# Patient Record
Sex: Female | Born: 1935 | Race: Black or African American | Hispanic: No | Smoking: Former smoker
Health system: Southern US, Community
[De-identification: ages and names within clinical notes are randomized; demographics above are authoritative.]

## PROBLEM LIST (undated history)

## (undated) DIAGNOSIS — J449 Chronic obstructive pulmonary disease, unspecified: Secondary | ICD-10-CM

## (undated) DIAGNOSIS — Z8679 Personal history of other diseases of the circulatory system: Secondary | ICD-10-CM

## (undated) DIAGNOSIS — I5032 Chronic diastolic (congestive) heart failure: Secondary | ICD-10-CM

## (undated) DIAGNOSIS — D649 Anemia, unspecified: Secondary | ICD-10-CM

## (undated) DIAGNOSIS — R059 Cough, unspecified: Secondary | ICD-10-CM

## (undated) DIAGNOSIS — Z87891 Personal history of nicotine dependence: Secondary | ICD-10-CM

## (undated) DIAGNOSIS — R05 Cough: Secondary | ICD-10-CM

## (undated) DIAGNOSIS — I10 Essential (primary) hypertension: Secondary | ICD-10-CM

## (undated) DIAGNOSIS — R0789 Other chest pain: Secondary | ICD-10-CM

## (undated) DIAGNOSIS — Z8719 Personal history of other diseases of the digestive system: Secondary | ICD-10-CM

## (undated) DIAGNOSIS — I359 Nonrheumatic aortic valve disorder, unspecified: Secondary | ICD-10-CM

## (undated) DIAGNOSIS — K2970 Gastritis, unspecified, without bleeding: Secondary | ICD-10-CM

## (undated) DIAGNOSIS — I517 Cardiomegaly: Secondary | ICD-10-CM

## (undated) DIAGNOSIS — E669 Obesity, unspecified: Secondary | ICD-10-CM

## (undated) HISTORY — DX: Personal history of other diseases of the circulatory system: Z86.79

## (undated) HISTORY — DX: Nonrheumatic aortic valve disorder, unspecified: I35.9

## (undated) HISTORY — DX: Anemia, unspecified: D64.9

## (undated) HISTORY — DX: Chronic diastolic (congestive) heart failure: I50.32

## (undated) HISTORY — DX: Cough, unspecified: R05.9

## (undated) HISTORY — DX: Essential (primary) hypertension: I10

## (undated) HISTORY — DX: Cardiomegaly: I51.7

## (undated) HISTORY — DX: Obesity, unspecified: E66.9

## (undated) HISTORY — DX: Other chest pain: R07.89

## (undated) HISTORY — PX: HERNIA REPAIR: SHX51

## (undated) HISTORY — DX: Gastritis, unspecified, without bleeding: K29.70

## (undated) HISTORY — DX: Personal history of other diseases of the digestive system: Z87.19

## (undated) HISTORY — DX: Chronic obstructive pulmonary disease, unspecified: J44.9

## (undated) HISTORY — DX: Personal history of nicotine dependence: Z87.891

## (undated) HISTORY — PX: APPENDECTOMY: SHX54

## (undated) HISTORY — DX: Cough: R05

---

## 1996-06-26 HISTORY — PX: OTHER SURGICAL HISTORY: SHX169

## 1999-08-12 ENCOUNTER — Emergency Department (HOSPITAL_COMMUNITY): Admission: EM | Admit: 1999-08-12 | Discharge: 1999-08-12 | Payer: Self-pay | Admitting: Emergency Medicine

## 1999-10-28 ENCOUNTER — Encounter: Payer: Self-pay | Admitting: Internal Medicine

## 1999-10-28 ENCOUNTER — Ambulatory Visit (HOSPITAL_COMMUNITY): Admission: RE | Admit: 1999-10-28 | Discharge: 1999-10-28 | Payer: Self-pay | Admitting: Internal Medicine

## 1999-11-09 HISTORY — PX: ESOPHAGOGASTRODUODENOSCOPY: SHX1529

## 1999-11-24 ENCOUNTER — Ambulatory Visit (HOSPITAL_COMMUNITY): Admission: RE | Admit: 1999-11-24 | Discharge: 1999-11-24 | Payer: Self-pay | Admitting: General Surgery

## 2000-02-04 ENCOUNTER — Ambulatory Visit (HOSPITAL_COMMUNITY): Admission: RE | Admit: 2000-02-04 | Discharge: 2000-02-04 | Payer: Self-pay | Admitting: General Surgery

## 2000-02-04 ENCOUNTER — Encounter (INDEPENDENT_AMBULATORY_CARE_PROVIDER_SITE_OTHER): Payer: Self-pay | Admitting: *Deleted

## 2002-03-26 ENCOUNTER — Encounter: Payer: Self-pay | Admitting: Emergency Medicine

## 2002-03-26 ENCOUNTER — Emergency Department (HOSPITAL_COMMUNITY): Admission: EM | Admit: 2002-03-26 | Discharge: 2002-03-26 | Payer: Self-pay | Admitting: Emergency Medicine

## 2002-12-25 ENCOUNTER — Ambulatory Visit (HOSPITAL_COMMUNITY): Admission: RE | Admit: 2002-12-25 | Discharge: 2002-12-25 | Payer: Self-pay | Admitting: Endocrinology

## 2002-12-25 ENCOUNTER — Encounter: Payer: Self-pay | Admitting: Endocrinology

## 2004-06-08 ENCOUNTER — Ambulatory Visit (HOSPITAL_COMMUNITY): Admission: RE | Admit: 2004-06-08 | Discharge: 2004-06-08 | Payer: Self-pay | Admitting: Endocrinology

## 2004-10-27 ENCOUNTER — Ambulatory Visit: Payer: Self-pay | Admitting: Internal Medicine

## 2004-11-09 ENCOUNTER — Ambulatory Visit: Payer: Self-pay

## 2004-11-09 HISTORY — PX: OTHER SURGICAL HISTORY: SHX169

## 2004-11-17 ENCOUNTER — Ambulatory Visit: Payer: Self-pay | Admitting: Internal Medicine

## 2005-03-24 ENCOUNTER — Ambulatory Visit: Payer: Self-pay | Admitting: Internal Medicine

## 2005-05-02 ENCOUNTER — Ambulatory Visit: Payer: Self-pay | Admitting: Internal Medicine

## 2005-05-02 ENCOUNTER — Inpatient Hospital Stay (HOSPITAL_COMMUNITY): Admission: EM | Admit: 2005-05-02 | Discharge: 2005-05-06 | Payer: Self-pay | Admitting: Emergency Medicine

## 2005-05-18 ENCOUNTER — Ambulatory Visit: Payer: Self-pay | Admitting: Endocrinology

## 2005-06-08 ENCOUNTER — Ambulatory Visit: Payer: Self-pay | Admitting: Internal Medicine

## 2005-06-14 ENCOUNTER — Ambulatory Visit: Payer: Self-pay | Admitting: Internal Medicine

## 2006-06-06 ENCOUNTER — Encounter: Admission: RE | Admit: 2006-06-06 | Discharge: 2006-06-06 | Payer: Self-pay | Admitting: Endocrinology

## 2006-10-27 ENCOUNTER — Ambulatory Visit: Payer: Self-pay | Admitting: Endocrinology

## 2006-10-27 LAB — CONVERTED CEMR LAB
BUN: 18 mg/dL (ref 6–23)
Bacteria, U Microscopic: NEGATIVE /hpf
Bilirubin Urine: NEGATIVE
CO2: 30 meq/L (ref 19–32)
Calcium: 9.8 mg/dL (ref 8.4–10.5)
Chloride: 107 meq/L (ref 96–112)
Chol/HDL Ratio, serum: 3.6
Cholesterol: 228 mg/dL (ref 0–200)
Creatinine, Ser: 1 mg/dL (ref 0.4–1.2)
Crystals: NEGATIVE
GFR calc non Af Amer: 56 mL/min
Glomerular Filtration Rate, Af Am: 68 mL/min/{1.73_m2}
Glucose, Bld: 94 mg/dL (ref 70–99)
HDL: 63.7 mg/dL (ref >39.0)
Hemoglobin, Urine: NEGATIVE
Ketones, ur: NEGATIVE mg/dL
LDL DIRECT: 134.8 mg/dL
Nitrite: NEGATIVE
Potassium: 4.6 meq/L (ref 3.5–5.1)
Sodium: 142 meq/L (ref 135–145)
Specific Gravity, Urine: 1.02 (ref 1.000–1.03)
TSH: 1.33 microintl units/mL (ref 0.35–5.50)
Total Protein, Urine: NEGATIVE mg/dL
Triglyceride fasting, serum: 103 mg/dL (ref 0–149)
Urine Glucose: NEGATIVE mg/dL
Urobilinogen, UA: 0.2 (ref 0.0–1.0)
VLDL: 21 mg/dL (ref 0–40)
pH: 5.5 (ref 5.0–8.0)

## 2006-11-03 ENCOUNTER — Ambulatory Visit: Payer: Self-pay

## 2006-11-03 ENCOUNTER — Encounter: Payer: Self-pay | Admitting: Cardiology

## 2006-11-03 HISTORY — PX: OTHER SURGICAL HISTORY: SHX169

## 2006-11-22 ENCOUNTER — Ambulatory Visit: Payer: Self-pay | Admitting: Internal Medicine

## 2006-11-23 ENCOUNTER — Ambulatory Visit: Payer: Self-pay | Admitting: Cardiovascular Disease

## 2006-12-16 ENCOUNTER — Ambulatory Visit: Payer: Self-pay | Admitting: Cardiovascular Disease

## 2007-05-29 ENCOUNTER — Ambulatory Visit: Payer: Self-pay | Admitting: Cardiovascular Disease

## 2007-07-13 ENCOUNTER — Ambulatory Visit: Payer: Self-pay | Admitting: Cardiovascular Disease

## 2007-07-13 LAB — CONVERTED CEMR LAB
BUN: 14 mg/dL (ref 6–23)
CO2: 27 meq/L (ref 19–32)
Calcium: 9.4 mg/dL (ref 8.4–10.5)
Chloride: 108 meq/L (ref 96–112)
Creatinine, Ser: 1 mg/dL (ref 0.4–1.2)
GFR calc Af Amer: 68 mL/min
GFR calc non Af Amer: 56 mL/min
Glucose, Bld: 82 mg/dL (ref 70–99)
Potassium: 4.7 meq/L (ref 3.5–5.1)
Sodium: 142 meq/L (ref 135–145)

## 2007-08-30 ENCOUNTER — Ambulatory Visit: Payer: Self-pay | Admitting: Cardiovascular Disease

## 2007-10-14 ENCOUNTER — Encounter: Payer: Self-pay | Admitting: Endocrinology

## 2007-10-14 DIAGNOSIS — J309 Allergic rhinitis, unspecified: Secondary | ICD-10-CM | POA: Insufficient documentation

## 2007-10-14 DIAGNOSIS — I10 Essential (primary) hypertension: Secondary | ICD-10-CM | POA: Insufficient documentation

## 2007-10-14 DIAGNOSIS — I959 Hypotension, unspecified: Secondary | ICD-10-CM | POA: Insufficient documentation

## 2007-10-14 DIAGNOSIS — Z87891 Personal history of nicotine dependence: Secondary | ICD-10-CM | POA: Insufficient documentation

## 2007-10-14 DIAGNOSIS — I517 Cardiomegaly: Secondary | ICD-10-CM | POA: Insufficient documentation

## 2007-10-14 DIAGNOSIS — R0789 Other chest pain: Secondary | ICD-10-CM | POA: Insufficient documentation

## 2007-12-07 ENCOUNTER — Ambulatory Visit: Payer: Self-pay | Admitting: Pulmonary Disease

## 2007-12-07 DIAGNOSIS — R05 Cough: Secondary | ICD-10-CM

## 2007-12-07 DIAGNOSIS — R059 Cough, unspecified: Secondary | ICD-10-CM | POA: Insufficient documentation

## 2008-01-05 ENCOUNTER — Encounter: Admission: RE | Admit: 2008-01-05 | Discharge: 2008-01-05 | Payer: Self-pay | Admitting: Internal Medicine

## 2008-03-25 ENCOUNTER — Ambulatory Visit: Payer: Self-pay | Admitting: Internal Medicine

## 2008-03-28 DIAGNOSIS — K2970 Gastritis, unspecified, without bleeding: Secondary | ICD-10-CM | POA: Insufficient documentation

## 2008-03-28 DIAGNOSIS — K2990 Gastroduodenitis, unspecified, without bleeding: Secondary | ICD-10-CM

## 2008-04-11 ENCOUNTER — Encounter: Payer: Self-pay | Admitting: Internal Medicine

## 2008-11-20 ENCOUNTER — Ambulatory Visit: Payer: Self-pay | Admitting: Cardiovascular Disease

## 2008-11-20 LAB — CONVERTED CEMR LAB
BUN: 21 mg/dL (ref 6–23)
Basophils Absolute: 0 10*3/uL (ref 0.0–0.1)
Basophils Relative: 0.6 % (ref 0.0–3.0)
CO2: 27 meq/L (ref 19–32)
Calcium: 9.3 mg/dL (ref 8.4–10.5)
Chloride: 110 meq/L (ref 96–112)
Cholesterol: 228 mg/dL (ref 0–200)
Creatinine, Ser: 1 mg/dL (ref 0.4–1.2)
Direct LDL: 117.6 mg/dL
Eosinophils Absolute: 0.1 10*3/uL (ref 0.0–0.7)
Eosinophils Relative: 2.8 % (ref 0.0–5.0)
GFR calc Af Amer: 68 mL/min
GFR calc non Af Amer: 56 mL/min
Glucose, Bld: 96 mg/dL (ref 70–99)
HCT: 39.2 % (ref 36.0–46.0)
HDL: 70.9 mg/dL (ref >39.0)
Hemoglobin: 13.2 g/dL (ref 12.0–15.0)
Lymphocytes Relative: 56 % — ABNORMAL HIGH (ref 12.0–46.0)
MCHC: 33.6 g/dL (ref 30.0–36.0)
MCV: 96 fL (ref 78.0–100.0)
Monocytes Absolute: 0.3 10*3/uL (ref 0.1–1.0)
Monocytes Relative: 7.5 % (ref 3.0–12.0)
Neutro Abs: 1.5 10*3/uL (ref 1.4–7.7)
Neutrophils Relative %: 33.1 % — ABNORMAL LOW (ref 43.0–77.0)
Platelets: 159 10*3/uL (ref 150–400)
Potassium: 4.1 meq/L (ref 3.5–5.1)
RBC: 4.09 M/uL (ref 3.87–5.11)
RDW: 13.1 % (ref 11.5–14.6)
Sodium: 142 meq/L (ref 135–145)
Total CHOL/HDL Ratio: 3.2
Triglycerides: 68 mg/dL (ref 0–149)
VLDL: 14 mg/dL (ref 0–40)
WBC: 4.4 10*3/uL — ABNORMAL LOW (ref 4.5–10.5)

## 2008-12-23 ENCOUNTER — Ambulatory Visit: Payer: Self-pay | Admitting: Internal Medicine

## 2008-12-23 ENCOUNTER — Inpatient Hospital Stay (HOSPITAL_COMMUNITY): Admission: EM | Admit: 2008-12-23 | Discharge: 2008-12-28 | Payer: Self-pay | Admitting: Emergency Medicine

## 2009-05-28 DIAGNOSIS — Z8719 Personal history of other diseases of the digestive system: Secondary | ICD-10-CM | POA: Insufficient documentation

## 2009-05-28 DIAGNOSIS — J439 Emphysema, unspecified: Secondary | ICD-10-CM | POA: Insufficient documentation

## 2009-05-29 ENCOUNTER — Ambulatory Visit: Payer: Self-pay | Admitting: Cardiovascular Disease

## 2009-05-29 DIAGNOSIS — R0989 Other specified symptoms and signs involving the circulatory and respiratory systems: Secondary | ICD-10-CM

## 2009-05-29 DIAGNOSIS — R0602 Shortness of breath: Secondary | ICD-10-CM | POA: Insufficient documentation

## 2009-05-29 DIAGNOSIS — R072 Precordial pain: Secondary | ICD-10-CM | POA: Insufficient documentation

## 2009-05-29 DIAGNOSIS — R0609 Other forms of dyspnea: Secondary | ICD-10-CM | POA: Insufficient documentation

## 2009-06-02 ENCOUNTER — Ambulatory Visit: Payer: Self-pay | Admitting: Cardiovascular Disease

## 2009-06-09 ENCOUNTER — Telehealth (INDEPENDENT_AMBULATORY_CARE_PROVIDER_SITE_OTHER): Payer: Self-pay | Admitting: *Deleted

## 2009-06-10 ENCOUNTER — Encounter: Payer: Self-pay | Admitting: Cardiovascular Disease

## 2009-06-10 ENCOUNTER — Ambulatory Visit: Payer: Self-pay

## 2009-06-10 ENCOUNTER — Encounter: Payer: Self-pay | Admitting: Internal Medicine

## 2009-06-26 ENCOUNTER — Ambulatory Visit: Payer: Self-pay | Admitting: Internal Medicine

## 2009-06-26 DIAGNOSIS — J310 Chronic rhinitis: Secondary | ICD-10-CM | POA: Insufficient documentation

## 2009-07-07 ENCOUNTER — Ambulatory Visit: Payer: Self-pay | Admitting: Cardiovascular Disease

## 2009-09-25 ENCOUNTER — Encounter: Payer: Self-pay | Admitting: Cardiovascular Disease

## 2009-09-25 ENCOUNTER — Encounter (INDEPENDENT_AMBULATORY_CARE_PROVIDER_SITE_OTHER): Payer: Self-pay | Admitting: *Deleted

## 2009-10-02 ENCOUNTER — Telehealth: Payer: Self-pay | Admitting: Cardiovascular Disease

## 2009-12-01 ENCOUNTER — Ambulatory Visit: Payer: Self-pay | Admitting: Cardiovascular Disease

## 2010-06-15 ENCOUNTER — Ambulatory Visit: Payer: Self-pay | Admitting: Cardiovascular Disease

## 2010-06-15 DIAGNOSIS — I359 Nonrheumatic aortic valve disorder, unspecified: Secondary | ICD-10-CM | POA: Insufficient documentation

## 2010-07-02 ENCOUNTER — Encounter: Payer: Self-pay | Admitting: Cardiovascular Disease

## 2010-07-02 ENCOUNTER — Ambulatory Visit: Payer: Self-pay

## 2010-07-02 ENCOUNTER — Ambulatory Visit (HOSPITAL_COMMUNITY): Admission: RE | Admit: 2010-07-02 | Discharge: 2010-07-02 | Payer: Self-pay | Admitting: Cardiovascular Disease

## 2010-07-02 ENCOUNTER — Ambulatory Visit: Payer: Self-pay | Admitting: Internal Medicine

## 2011-01-07 ENCOUNTER — Ambulatory Visit
Admission: RE | Admit: 2011-01-07 | Discharge: 2011-01-07 | Payer: Self-pay | Source: Home / Self Care | Attending: Cardiovascular Disease | Admitting: Cardiovascular Disease

## 2011-01-07 ENCOUNTER — Encounter: Payer: Self-pay | Admitting: Cardiovascular Disease

## 2011-01-24 LAB — CONVERTED CEMR LAB
BUN: 19 mg/dL (ref 6–23)
CO2: 27 meq/L (ref 19–32)
Calcium: 9.6 mg/dL (ref 8.4–10.5)
Chloride: 109 meq/L (ref 96–112)
Creatinine, Ser: 1 mg/dL (ref 0.4–1.2)
GFR calc non Af Amer: 67.4 mL/min (ref >60)
Glucose, Bld: 82 mg/dL (ref 70–99)
Potassium: 3.8 meq/L (ref 3.5–5.1)
Pro B Natriuretic peptide (BNP): 26 pg/mL (ref 0.0–100.0)
Sodium: 142 meq/L (ref 135–145)

## 2011-01-26 NOTE — Assessment & Plan Note (Signed)
Summary: STOMACH CRAMPS AND SOB/ MBW   Chief Complaint:  upset stomach.  History of Present Illness: 77 year old with knonwn history of HTN, cough, allergic rhinitis present for 4 days of epigastic discomfort, gas, bloating, nausea. Denies chest pain, dyspnea, orthopnea, hemoptysis, fever, n/v/d, edema, recent travel or antibitics, bloody stools, urinary sx.      Prior Medication List:  ADULT ASPIRIN LOW STRENGTH 81 MG  TBDP (ASPIRIN) take 1 by mouth qd PRILOSEC 20 MG  CPDR (OMEPRAZOLE) take 1 by mouth qd HYDROCHLOROTHIAZIDE 12.5 MG  TABS (HYDROCHLOROTHIAZIDE) take 1 by mouth once daily   Current Allergies (reviewed today): ! Delrae Sawyers  Past Medical History:    Reviewed history from 10/14/2007 and no changes required:       COUGH (ICD-786.2)       VENTRICULAR HYPERTROPHY, LEFT (ICD-429.3)       ATHEROSCLEROTIC CARDIOVASCULAR DISEASE (ICD-429.2)       TOBACCO USE, QUIT (ICD-V15.82)       OBESITY (ICD-278.00)       CHEST PAIN, NON-CARDIAC (ICD-786.59)       HYPERTENSION (ICD-401.9)       ALLERGIC RHINITIS (ICD-477.9)           Family History:    Reviewed history and no changes required:  Social History:    Reviewed history and no changes required:   Risk Factors: Tobacco use:  quit    Year quit:  pt unable to remember    Pack-years:  30yrs, socially  Colonoscopy History:    Date of Last Colonoscopy:  06/14/2005   Review of Systems      See HPI   Vital Signs:  Patient Profile:   77 Years Old Female Weight:      147 pounds O2 Sat:      98 % O2 treatment:    Room Air Temp:     97.7 degrees F oral BP sitting:   122 / 70  (left arm) Cuff size:   regular  Vitals Entered By: Boone Master CNA (March 25, 2008 3:48 PM)             Is Patient Diabetic? No Comments Medications reviewed with patient ..................................................................Marland KitchenBoone Master CNA  March 25, 2008 3:48 PM      Physical Exam  GENERAL:  A/Ox3; pleasant &  cooperative.NAD HEENT:  [place]/AT, EOM-wnl, PERRLA, EACs-clear, TMs-wnl, NOSE-clear, THROAT-clear & wnl. NECK:  Supple w/ fair ROM; no JVD; normal carotid impulses w/o bruits; no thyromegaly or nodules palpated; no lymphadenopathy. CHEST:  Clear to P & A; w/o, wheezes/ rales/ or rhonchi. HEART:  RRR, no m/r/g  heard ABDOMEN:  Soft & nt; nml bowel sounds; no organomegaly or masses detected. EXT: Warm bilat,  no calf pain, edema, clubbing, pulses intact Skin: no rash/lesion      Impression & Recommendations:  Problem # 1:  GASTRITIS (ICD-535.50) Advance bland diet (baked chicken, potatoe, rice banana) Avoid spicy lactose, chocolate, caffeine, sodas, mints Begin Prilosec 20mg  once daily  Gas-x with meals as needed  follow up Dr. Sherene Sires 4 weeks  Please contact office for sooner follow up if symptoms do not improve or worsen   Orders: Est. Patient Level III (57846) Discussed use of medication, as well as lifestyle changes.   Medications Added to Medication List This Visit: 1)  Cvs Saline Nasal Spray 0.65 % Soln (Saline) .... Use as directed   Patient Instructions: 1)  Advance bland diet (baked chicken, potatoe, rice banana) 2)  Avoid spicy lactose, chocolate, caffeine,  sodas, mints 3)  Begin Prilosec 20mg  once daily  4)  Gas-x with meals as needed  5)  follow up Dr. Sherene Sires 4 weeks  6)  Please contact office for sooner follow up if symptoms do not improve or worsen     ]

## 2011-01-26 NOTE — Assessment & Plan Note (Signed)
Summary: 1 month rov@10 :30/sl   Visit Type:  Follow-up Primary Provider:  Dr. Excell Seltzer  CC:  chest pain- Sob.  History of Present Illness: Desiree Hutchinson is a delightful 77 year old woman with chronic diastolic heart failure and mild valvular disease.  She complains of exertional dyspnea with minimal activity. This is progressive over the past several months. She denies edema, orthopnea, PND. No palps, lightheadedness, or syncope.  She was seen last month and her workup included an echo that showed normal LV function with LVEF 60% and abnormal LV relaxation (estimated high LV filling pressures). There was moderate AS and AI. A stress nuclear study was also performed and it was negative for ischemia. She was evaluated by Dr Sherene Sires who felt her symptoms are not related to pulmonary disease.  Current Medications (verified): 1)  Adult Aspirin Low Strength 81 Mg  Tbdp (Aspirin) .... Take 1 By Mouth Qd 2)  Cvs Saline Nasal Spray 0.65 %  Soln (Saline) .... Use As Directed 3)  Gas-X Extra Strength 125 Mg Caps (Simethicone) .... As Needed 4)  Avalide 150-12.5 Mg Tabs (Irbesartan-Hydrochlorothiazide) .... Take 1 Tablet By Mouth Once A Day 5)  Omnaris 50 Mcg/act  Susp (Ciclesonide) .... Two Puffs Each Nostril Daily  Allergies: 1)  * Univasc  Past History:  Past medical history reviewed for relevance to current acute and chronic problems.  Past Medical History: Reviewed history from 06/26/2009 and no changes required. ATHEROSCLEROTIC CARDIOVASCULAR DISEASE (ICD-429.2) HYPERTENSION (ICD-401.9) VENTRICULAR HYPERTROPHY, LEFT (ICD-429.3) GASTRITIS (ICD-535.50) COUGH (ICD-786.2) TOBACCO USE, QUIT (ICD-V15.82) OBESITY (ICD-278.00) CHEST PAIN, NON-CARDIAC (ICD-786.59) ALLERGIC RHINITIS (ICD-477.9) COPD (ICD-496)    - No PFT's on record SMALL BOWEL OBSTRUCTION, HX OF (ICD-V12.79)  Chronic diastolic heart failure.  Aortic Valve DZ  regurge > stenosis     - See Echo 06/10/09    Review of Systems     Negative except as per HPI   Vital Signs:  Patient profile:   77 year old female Height:      62 inches Weight:      142 pounds Pulse rate:   76 / minute Pulse rhythm:   regular Resp:     20 per minute BP sitting:   116 / 70  (left arm) Cuff size:   regular  Vitals Entered By: Vikki Ports (July 07, 2009 10:48 AM)  Physical Exam  General:  Pt is alert and oriented, elderly woman, in no acute distress. HEENT: normal Neck: normal carotid upstrokes without bruits, JVP normal Lungs: CTA CV: RRR with grade II/VI systolic murmur along LSB. There is no diastolic murmur. Abd: soft, NT, positive BS, no bruit, no organomegaly Ext: no clubbing, cyanosis, or edema. peripheral pulses 2+ and equal Skin: warm and dry without rash    EKG  Procedure date:  07/07/2009  Findings:      NSR, ST-T changes consider anterolateral ischemia.  Impression & Recommendations:  Problem # 1:  SHORTNESS OF BREATH (ICD-786.05) Suspect this is related to combination of diastolic dysfunction/noncompliant LV and aortic stenosis. Echo findings are suggestive of these findings. BNP was normal at 26. Would continue Avalide at present. BP is well-controlled. At age 30, I would not pursue cath or AVR at this point. In addition, AS does not appear critical by echo.  Her updated medication list for this problem includes:    Adult Aspirin Low Strength 81 Mg Tbdp (Aspirin) .Marland Kitchen... Take 1 by mouth qd    Avalide 150-12.5 Mg Tabs (Irbesartan-hydrochlorothiazide) .Marland Kitchen... Take 1 tablet by mouth once a  day  Problem # 2:  HYPERTENSION (ICD-401.9) BP well-controlled on present regimen. Her updated medication list for this problem includes:    Adult Aspirin Low Strength 81 Mg Tbdp (Aspirin) .Marland Kitchen... Take 1 by mouth qd    Avalide 150-12.5 Mg Tabs (Irbesartan-hydrochlorothiazide) .Marland Kitchen... Take 1 tablet by mouth once a day  Patient Instructions: 1)  Your physician recommends that you schedule a follow-up appointment in: 3  MONTHS. Prescriptions: AVALIDE 150-12.5 MG TABS (IRBESARTAN-HYDROCHLOROTHIAZIDE) Take 1 tablet by mouth once a day  #32 x 11   Entered by:   Bernita Raisin, RN, BSN   Authorized by:   Norva Karvonen, MD   Signed by:   Bernita Raisin, RN, BSN on 07/07/2009   Method used:   [facility]        [facility] [address] [phone]* (retail)       [address]       [place], [place]  [address]       Ph: [phone] or [phone]       Fax: [phone]   RxID:   [id number]

## 2011-01-26 NOTE — Assessment & Plan Note (Signed)
Summary: 3 month rov   Visit Type:  3 months follow up Primary Provider:  Dr. Excell Seltzer  CC:  Sob sometimes.  History of Present Illness: This is an 77 year old woman who presents for followup of chronic dyspnea. She continues to have dyspnea with low-level activity. She denies resting symptoms. She denies orthopnea, PND, or edema. She's undergone an extensive cardiac evaluation, including a nuclear stress test which showed no ischemia. She had an echocardiogram that showed normal LV systolic function with diastolic parameters consistent with high LV filling pressures. Her BNP was normal. She has also been followed by Dr. Sherene Sires, who has treated her for reflux disease, but has not felt that the patient has had intrinsic lung disease. She denies chest pain, palpitations, lightheadedness, or syncope.  Current Medications (verified): 1)  Adult Aspirin Low Strength 81 Mg  Tbdp (Aspirin) .... Take 1 By Mouth Qd 2)  Cvs Saline Nasal Spray 0.65 %  Soln (Saline) .... Use As Directed 3)  Gas-X Extra Strength 125 Mg Caps (Simethicone) .... As Needed 4)  Omnaris 50 Mcg/act  Susp (Ciclesonide) .... Two Puffs Each Nostril Daily 5)  Diovan Hct 80-12.5 Mg Tabs (Valsartan-Hydrochlorothiazide) .... Take 1 Tablet By Mouth Once A Day  Allergies: 1)  * Univasc  Past History:  Past medical history reviewed for relevance to current acute and chronic problems.  Past Medical History: Reviewed history from 06/26/2009 and no changes required. ATHEROSCLEROTIC CARDIOVASCULAR DISEASE (ICD-429.2) HYPERTENSION (ICD-401.9) VENTRICULAR HYPERTROPHY, LEFT (ICD-429.3) GASTRITIS (ICD-535.50) COUGH (ICD-786.2) TOBACCO USE, QUIT (ICD-V15.82) OBESITY (ICD-278.00) CHEST PAIN, NON-CARDIAC (ICD-786.59) ALLERGIC RHINITIS (ICD-477.9) COPD (ICD-496)    - No PFT's on record SMALL BOWEL OBSTRUCTION, HX OF (ICD-V12.79)  Chronic diastolic heart failure.  Aortic Valve DZ  regurge > stenosis     - See Echo 06/10/09    Review of  Systems       Negative except as per HPI   Vital Signs:  Patient profile:   77 year old female Height:      62 inches Weight:      143.25 pounds Pulse rate:   64 / minute Pulse rhythm:   regular Resp:     18 per minute BP sitting:   110 / 62  (left arm) Cuff size:   regular  Vitals Entered By: Vikki Ports (December 01, 2009 3:46 PM)  Physical Exam  General:  Pt is alert and oriented, elderly woman, in no acute distress. HEENT: normal Neck: normal carotid upstrokes without bruits, JVP normal Lungs: CTA CV: RRR without murmur or gallop Abd: soft, NT, positive BS, no bruit, no organomegaly Ext: no clubbing, cyanosis, or edema. peripheral pulses 2+ and equal Skin: warm and dry without rash    EKG  Procedure date:  12/01/2009  Findings:      Normal sinus rhythm with heart rate 64 beats per minute T-wave abnormality consider anterolateral ischemia and inferior ischemia  Impression & Recommendations:  Problem # 1:  SHORTNESS OF BREATH (ICD-786.05) The patient continues to have dyspnea. I suspect this is multifactorial, but other than her diastolic abnormalities by echo, I cannot find another cardiac etiology. Her blood pressure is well-controlled and she has no evidence of fluid overload on exam. Her BNP has been normal at less than 50. Will increase her hydrochlorothiazide from 12.5-25 mg daily to see if she may be very volume sensitive and if this will improve her dyspnea.  Her updated medication list for this problem includes:    Adult Aspirin Low Strength 81  Mg Tbdp (Aspirin) .Marland Kitchen... Take 1 by mouth qd    Diovan Hct 80-12.5 Mg Tabs (Valsartan-hydrochlorothiazide) .Marland Kitchen... Take 1 tablet by mouth once a day    Hydrochlorothiazide 12.5 Mg Tabs (Hydrochlorothiazide) .Marland Kitchen... Take one tablet by mouth daily.  Problem # 2:  HYPERTENSION (ICD-401.9) BP is under excellent control on her current medical program.  Her updated medication list for this problem includes:    Adult Aspirin  Low Strength 81 Mg Tbdp (Aspirin) .Marland Kitchen... Take 1 by mouth qd    Diovan Hct 80-12.5 Mg Tabs (Valsartan-hydrochlorothiazide) .Marland Kitchen... Take 1 tablet by mouth once a day    Hydrochlorothiazide 12.5 Mg Tabs (Hydrochlorothiazide) .Marland Kitchen... Take one tablet by mouth daily.  Orders: EKG w/ Interpretation (93000)  BP today: 110/62 Prior BP: 116/70 (07/07/2009)  Labs Reviewed: K+: 3.8 (05/29/2009) Creat: : 1.0 (05/29/2009)   Chol: 228 (11/20/2008)   HDL: 70.9 (11/20/2008)   LDL: DEL (11/20/2008)   TG: 68 (11/20/2008)  Patient Instructions: 1)  Your physician wants you to follow-up in:   6 MONTHS. You will receive a reminder letter in the mail two months in advance. If you don't receive a letter, please call our office to schedule the follow-up appointment. 2)  Your physician has recommended you make the following change in your medication: START HCTZ 12.5mg  once a day Prescriptions: HYDROCHLOROTHIAZIDE 12.5 MG TABS (HYDROCHLOROTHIAZIDE) Take one tablet by mouth daily.  #30 x 11   Entered by:   Julieta Gutting, RN, BSN   Authorized by:   Norva Karvonen, MD   Signed by:   Julieta Gutting, RN, BSN on 12/01/2009   Method used:   [facility] to        [facility] [address] [phone]* (retail)       [address]       [place], [place]  [address]       Ph: [phone] or [phone]       Fax: [phone]   RxID:   [id number]

## 2011-01-26 NOTE — Assessment & Plan Note (Signed)
Summary: f41m   Medications Added GAS-X EXTRA STRENGTH 125 MG CAPS (SIMETHICONE) as needed      Allergies Added: NKDA  Visit Type:  Follow-up  CC:  Sob- and chest pains.  History of Present Illness: Desiree Hutchinson is a delightful 77 year old woman with chronic diastolic heart failure and mild valvular disease.  She complains of exertional dyspnea with minimal activity. This is progressive over the past few months. She also c/o sharp, left sided chest pains, nonradiating.  Chest pain occurs at rest and with exertion. She denies edema, orthopnea, PND. No palps, lightheadedness, or syncope.  Current Medications (verified): 1)  Adult Aspirin Low Strength 81 Mg  Tbdp (Aspirin) .... Take 1 By Mouth Qd 2)  Cvs Saline Nasal Spray 0.65 %  Soln (Saline) .... Use As Directed 3)  Hydrochlorothiazide 12.5 Mg  Tabs (Hydrochlorothiazide) .... Take 1 By Mouth Once Daily 4)  Gas-X Extra Strength 125 Mg Caps (Simethicone) .... As Needed  Allergies (verified): No Known Drug Allergies  Past History:  Past medical, surgical, family and social histories (including risk factors) reviewed, and no changes noted (except as noted below).  Past Medical History: Reviewed history from 05/28/2009 and no changes required. Current Problems:  ATHEROSCLEROTIC CARDIOVASCULAR DISEASE (ICD-429.2) HYPERTENSION (ICD-401.9) VENTRICULAR HYPERTROPHY, LEFT (ICD-429.3) GASTRITIS (ICD-535.50) COUGH (ICD-786.2) TOBACCO USE, QUIT (ICD-V15.82) OBESITY (ICD-278.00) CHEST PAIN, NON-CARDIAC (ICD-786.59) ALLERGIC RHINITIS (ICD-477.9) COPD (ICD-496) SMALL BOWEL OBSTRUCTION, HX OF (ICD-V12.79)  Chronic diastolic heart failure.     Past Surgical History: Reviewed history from 05/28/2009 and no changes required. Small bowel obstruction (06/1996) EGD (11/09/1999) Adenoasine Cardiolite (11/09/2004) Transthoracic Echocardiogram(911/07/2006) Appendectomy Hernias repair  Social History: Reviewed history from 05/28/2009 and no  changes required. The patient is a retired Tree surgeon.  She is a remote  smoker.  She does not drink alcohol.  Review of Systems       Negative except as per HPI   Vital Signs:  Patient profile:   77 year old female Height:      63 inches Weight:      143 pounds Pulse rate:   82 / minute Pulse rhythm:   regular Resp:     20 per minute BP sitting:   130 / 74  (left arm) Cuff size:   large  Vitals Entered By: Vikki Ports (May 29, 2009 2:14 PM)  Physical Exam  General:  Pt is alert and oriented, elderly woman, in no acute distress. HEENT: normal Neck: normal carotid upstrokes without bruits, JVP normal Lungs: CTA CV: RRR without murmur or gallop Abd: soft, NT, positive BS, no bruit, no organomegaly Ext: no clubbing, cyanosis, or edema. peripheral pulses 2+ and equal Skin: warm and dry without rash    EKG  Procedure date:  05/29/2009  Findings:      NSR, LVH with repolarization abnormality  Impression & Recommendations:  Problem # 1:  SHORTNESS OF BREATH (ICD-786.05) Question etiology of symptoms. The patient has LVH on EKG and hx diastolic heart failure, but her exam suggests euvolemia. BNP was checked in the office and is within the normal range. CXR also checked and shows stable COPD changes but no acute process. Will repeat echo to assess LV function.  Her updated medication list for this problem includes:    Adult Aspirin Low Strength 81 Mg Tbdp (Aspirin) .Marland Kitchen... Take 1 by mouth qd    Hydrochlorothiazide 12.5 Mg Tabs (Hydrochlorothiazide) .Marland Kitchen... Take 1 by mouth once daily  Problem # 2:  PRECORDIAL PAIN (ICD-786.51) In setting of chest pain  with typical and atypical features, will check adenosine myoview stress test to rule-out ischemia. Her updated medication list for this problem includes:    Adult Aspirin Low Strength 81 Mg Tbdp (Aspirin) .Marland Kitchen... Take 1 by mouth qd  Orders: EKG w/ Interpretation (93000) Echocardiogram (Echo) Nuclear Stress Test (Nuc Stress  Test) TLB-BMP (Basic Metabolic Panel-BMET) (80048-METABOL) TLB-BNP (B-Natriuretic Peptide) (83880-BNPR) T-Chest x-ray, 2 views (04540)  Patient Instructions: 1)  Your physician recommends that you schedule a follow-up appointment in: 1 month 2)  Your physician recommends that you have lab work today:bmet/bnp  3)  Your physician has requested that you have an echocardiogram.  Echocardiography is a painless test that uses sound waves to create images of your heart. It provides your doctor with information about the size and shape of your heart and how well your heart's chambers and valves are working.  This procedure takes approximately one hour. There are no restrictions for this procedure. 4)  Your physician has requested that you have an adenosine myoview.  For further information please visit [email or web address]  Please follow instruction sheet, as given. 5)  Order given to patient for chest x-ray at [facility] today. (Per pt's daughter she will have this next week) Prescriptions: HYDROCHLOROTHIAZIDE 12.5 MG  TABS (HYDROCHLOROTHIAZIDE) take 1 by mouth once daily  #30 x 6   Entered by:   Sherri Rad, RN, BSN   Authorized by:   Norva Karvonen, MD   Signed by:   Sherri Rad, RN, BSN on 05/29/2009   Method used:   [facility] to        [facility] [address] [phone]* (retail)       [address]       [place], [place]  [address]       Ph: [phone] or [phone]       Fax: [phone]   RxID:   [id number]

## 2011-01-26 NOTE — Progress Notes (Signed)
Summary: NUCLEAR PRE-PROCEDURE  Phone Note Outgoing Call   Summary of Call: Reviewed information on Myoview Information Sheet (see scanned document for further details).  Spoke with patients daughter.       Nuclear Med Background Indications for Stress Test: Evaluation for Ischemia   History: Echo, Myocardial Perfusion Study  History Comments: 11/05 MPS (-) EF 66% 11/07 ECHO EFM55-60% H/O CHRONIC DIASTOLIC HEART FAILURE  Symptoms: Chest Pain, DOE, SOB    Nuclear Pre-Procedure Cardiac Risk Factors: History of Smoking, Hypertension, Lipids Height (in): 63

## 2011-01-26 NOTE — Miscellaneous (Signed)
Summary: update med  Clinical Lists Changes  Medications: Removed medication of AVALIDE 150-12.5 MG TABS (IRBESARTAN-HYDROCHLOROTHIAZIDE) Take 1 tablet by mouth once a day Added new medication of DIOVAN HCT 80-12.5 MG TABS (VALSARTAN-HYDROCHLOROTHIAZIDE) Take 1 tablet by mouth once a day

## 2011-01-26 NOTE — Assessment & Plan Note (Signed)
Summary: cough,cong./apc   Vital Signs:  Patient Profile:   77 Years Old Female Weight:      152.25 pounds O2 Sat:      98 % Temp:     98.7 degrees F oral Pulse rate:   85 / minute BP sitting:   130 / 78  (left arm)  Pt. in pain?   no  Vitals Entered By: Cyndia Diver LPN (December 07, 2007 10:27 AM) Oxygen therapy Room Air              Comments pt c/o cough with clear thick sputum in the morning, nasal drainage, tightness in chest, and sob.      Chief Complaint:  sick follow up.  History of Present Illness: patient comes in today with a complaint of a cough for the last two weeks.  The cough is primarily dry in nature with no mucus production except first thing in the morning whenever she arises.  There is been no purulence.  Patient does note postnasal drip, and even rhinorrhea at times.  She denies any reflux symptoms and is currently on Prilosec daily.  The daughter has noted a lot of upper airway noise and is concerned about her.  The patient states that her chronic dyspnea on exertion is no different than one to two months ago.  Patient denies fevers, chills, or sweats  Current Allergies: ! * UNIVASC     Review of Systems      See HPI   Physical Exam  General:     thin black female, in no acute distress Nose:     mild erythema with deviated septum to the left Mouth:     oropharynx is clear Lungs:     totally clear to auscultation Heart:     regular rate and rhythm, with a blowing systolic murmur Extremities:     no edema    Impression & Recommendations:  Problem # 1:  ALLERGIC RHINITIS (ICD-477.9) cough, that I suspect is due to postnasal drip.  I also cannot exclude the possibility of reflux disease.  There is nothing to suggest by history or exam a lower airway issue.will treat the patient with a nasal hygiene regimen as well as a nonsedating antihistamine.  I have recommended to the patient that she take Zyrtec over-the-counter 10 mg nightly.  I've  also asked her to use nasal saline spray for moisturizing her mucosa.  The patient will follow-up with Dr. Sherene Sires if she fails to see improvement Orders: Est. Patient Level III ([address])    Patient Instructions: 1)  zyrtec otc one at bedtime 2)  nasal saline spray as often as possible. 3)  f/u with Dr. Sherene Sires if doesn't improve    ]

## 2011-01-26 NOTE — Assessment & Plan Note (Signed)
Summary: f37m   Visit Type:  Follow-up Primary Provider:  Dr. Excell Seltzer  CC:  sob.  History of Present Illness: This is an 77 year old woman who presents for followup of chronic dyspnea. She continues to have dyspnea with low-level activity. She denies resting symptoms. She denies orthopnea, PND, or edema. She's undergone an extensive cardiac evaluation, including a nuclear stress test which showed no ischemia. She had an echocardiogram that showed normal LV systolic function with diastolic parameters consistent with high LV filling pressures. She has moderately severe aortic stenosis by echo. Her BNP was normal.   She continues to have dyspnea with walking on level ground at short distance. She denies chest pain or pressure.   Current Medications (verified): 1)  Adult Aspirin Low Strength 81 Mg  Tbdp (Aspirin) .... Take 1 By Mouth Qd 2)  Gas-X Extra Strength 125 Mg Caps (Simethicone) .... As Needed 3)  Omnaris 50 Mcg/act  Susp (Ciclesonide) .... Two Puffs Each Nostril Daily 4)  Diovan Hct 80-12.5 Mg Tabs (Valsartan-Hydrochlorothiazide) .... Take 1 Tablet By Mouth Once A Day 5)  Hydrochlorothiazide 12.5 Mg Tabs (Hydrochlorothiazide) .... Take One Tablet By Mouth Daily.  Allergies (verified): 1)  * Univasc  Past History:  Past medical history reviewed for relevance to current acute and chronic problems.  Past Medical History: Reviewed history from 06/26/2009 and no changes required. ATHEROSCLEROTIC CARDIOVASCULAR DISEASE (ICD-429.2) HYPERTENSION (ICD-401.9) VENTRICULAR HYPERTROPHY, LEFT (ICD-429.3) GASTRITIS (ICD-535.50) COUGH (ICD-786.2) TOBACCO USE, QUIT (ICD-V15.82) OBESITY (ICD-278.00) CHEST PAIN, NON-CARDIAC (ICD-786.59) ALLERGIC RHINITIS (ICD-477.9) COPD (ICD-496)    - No PFT's on record SMALL BOWEL OBSTRUCTION, HX OF (ICD-V12.79)  Chronic diastolic heart failure.  Aortic Valve DZ  regurge > stenosis     - See Echo 06/10/09    Review of Systems       Negative except as  per HPI   Vital Signs:  Patient profile:   78 year old female Height:      62 inches Weight:      140 pounds BMI:     25.70 O2 Sat:      98 % on Room air Pulse rate:   86 / minute Resp:     16 per minute BP sitting:   102 / 58  (left arm) Cuff size:   regular  Vitals Entered By: Burnett Kanaris, CNA (June 15, 2010 2:26 PM)  O2 Flow:  Room air  Physical Exam  General:  Pt is alert and oriented, elderly, African-American woman, in no acute distress. HEENT: normal Neck: normal carotid upstrokes without bruits, JVP normal Lungs: CTA CV: RRR with 3/6 systolic murmur best heard at LSB Abd: soft, NT, positive BS, no bruit, no organomegaly Ext: no clubbing, cyanosis, or edema. peripheral pulses 2+ and equal Skin: warm and dry without rash    EKG  Procedure date:  06/15/2010  Findings:      NSR, nonspecific T wave abnormality, HR 87 bpm.  Impression & Recommendations:  Problem # 1:  AORTIC STENOSIS (ICD-424.1) Last echo reviewed and reported both significant AS and AI. I don't appreciate AI on exam. Will repeat echo to rule out progression of AS to severe range. I suspect her dyspnea is multifactorial and not solely attributable to AS. If she does have progressive AS, will have to consider treatment options carefully at her advanced age. BP is well-controlled and I don't see any other modifiable problems that we can correct with medication. I walked the patient in the office today and oxygen saturation remained greater than  95% with ambulation.  Her updated medication list for this problem includes:    Diovan Hct 80-12.5 Mg Tabs (Valsartan-hydrochlorothiazide) .Marland Kitchen... Take 1 tablet by mouth once a day    Hydrochlorothiazide 12.5 Mg Tabs (Hydrochlorothiazide) .Marland Kitchen... Take one tablet by mouth daily.  Orders: Echocardiogram (Echo)  Problem # 2:  HYPERTENSION (ICD-401.9) BP well-controlled.  Her updated medication list for this problem includes:    Adult Aspirin Low Strength 81 Mg  Tbdp (Aspirin) .Marland Kitchen... Take 1 by mouth qd    Diovan Hct 80-12.5 Mg Tabs (Valsartan-hydrochlorothiazide) .Marland Kitchen... Take 1 tablet by mouth once a day    Hydrochlorothiazide 12.5 Mg Tabs (Hydrochlorothiazide) .Marland Kitchen... Take one tablet by mouth daily.  BP today: 102/58 Prior BP: 110/62 (12/01/2009)  Labs Reviewed: K+: 3.8 (05/29/2009) Creat: : 1.0 (05/29/2009)   Chol: 228 (11/20/2008)   HDL: 70.9 (11/20/2008)   LDL: DEL (11/20/2008)   TG: 68 (11/20/2008)  Other Orders: EKG w/ Interpretation (93000)  Patient Instructions: 1)  Your physician recommends that you continue on your current medications as directed. Please refer to the Current Medication list given to you today. 2)  Your physician wants you to follow-up in:  6 MONTHS.  You will receive a reminder letter in the mail two months in advance. If you don't receive a letter, please call our office to schedule the follow-up appointment. 3)  Your physician has requested that you have an echocardiogram.  Echocardiography is a painless test that uses sound waves to create images of your heart. It provides your doctor with information about the size and shape of your heart and how well your heart's chambers and valves are working.  This procedure takes approximately one hour. There are no restrictions for this procedure. Prescriptions: HYDROCHLOROTHIAZIDE 12.5 MG TABS (HYDROCHLOROTHIAZIDE) Take one tablet by mouth daily.  #90 x 3   Entered by:   Julieta Gutting, RN, BSN   Authorized by:   Norva Karvonen, MD   Signed by:   Julieta Gutting, RN, BSN on 06/15/2010   Method used:   [facility] to        [facility] [address] [phone]* (retail)       [address]       [place], [place]  [address]       Ph: [phone] or [phone]       Fax: [phone]   RxID:   [id number] DIOVAN HCT 80-12.5 MG TABS (VALSARTAN-HYDROCHLOROTHIAZIDE) Take 1 tablet by mouth once a day  #90 x 3   Entered by:   Julieta Gutting, RN, BSN   Authorized  by:   Norva Karvonen, MD   Signed by:   Julieta Gutting, RN, BSN on 06/15/2010   Method used:   [facility]        [facility] [address] [phone]* (retail)       [address]       [place], [place]  [address]       Ph: [phone] or [phone]       Fax: [phone]   RxID:   [id number]

## 2011-01-26 NOTE — Assessment & Plan Note (Signed)
Summary: Cardiology Nuclear Study  Nuclear Med Background Indications for Stress Test: Evaluation for Ischemia   History: Echo, Myocardial Perfusion Study  History Comments: 11/05 MPS (-) EF 66% 11/07 ECHO EFM55-60% H/O CHRONIC DIASTOLIC HEART FAILURE  Symptoms: Chest Pain, Chest Pain with Exertion, DOE, Fatigue, Palpitations, Rapid HR, SOB    Nuclear Pre-Procedure Cardiac Risk Factors: History of Smoking, Hypertension, Lipids, Overweight Caffeine/Decaff Intake: none NPO After: 6:00 PM Lungs: clear IV 0.9% NS with Angio Cath: 22g     IV Site: (R) AC IV Started by: Irean Hong RN Chest Size (in) 36     Cup Size B     Height (in): 62 Weight (lb): 139 BMI: 25.52  Nuclear Med Study 1 or 2 day study:  1 day     Stress Test Type:  Adenosine Reading MD:  Arvilla Meres, MD     Referring MD:  Judie Petit. Copper Resting Radionuclide:  Technetium 87m Tetrofosmin     Resting Radionuclide Dose:  10.8 mCi  Stress Radionuclide:  Technetium 59m Tetrofosmin     Stress Radionuclide Dose:  33 mCi   Stress Protocol  Dose of Adenosine:  35.4 mg    Stress Test Technologist:  Irean Hong RN     Nuclear Technologist:  Domenic Polite CNMT  Rest Procedure  Myocardial perfusion imaging was performed at rest 45 minutes following the intraveneous administration of Myoview Technetium 51m Tetrofosmin.  Stress Procedure  The patient received IV adenosine at 140 mcg/kg/min for 3 minutes. The EKG was nondiagnostic due to baseline changes, 2nd AVB 1and 2, and 3rd AVB with infusion decreased to 133mcg/kg/min for last minute.  Myoview was injected at the 2 minute mark and quantitative spect images were obtained after a 45 minute delay.  QPS Raw Data Images:  Normal; no motion artifact; normal heart/lung ratio. Stress Images:  There is normal uptake in all areas. Rest Images:  Normal homogeneous uptake in all areas of the myocardium. Subtraction (SDS):  Normal Transient Ischemic Dilatation:  .91  (Normal  <1.22)  Lung/Heart Ratio:  .22  (Normal <0.45)  Quantitative Gated Spect Images QGS EDV:  65 ml QGS ESV:  25 ml QGS EF:  61 % QGS cine images:  Normal  Findings Normal nuclear study      Overall Impression  Exercise Capacity: Adenosine study with no exercise. ECG Impression: Baseline: NSR; with diffuse T wave abnormalities. Transient normalization of T wave abnormalities with adenosine. Overall Impression: Normal stress nuclear study.  Appended Document: Cardiology Nuclear Study reviewed. please notify pt of normal result.  Appended Document: Cardiology Nuclear Study Pt's daughter aware of results by phone.  Pt has an appt on July 12th with Dr Excell Seltzer.

## 2011-01-26 NOTE — Medication Information (Signed)
Summary: Diovan  Diovan   Imported By: Marylou Mccoy 10/07/2009 09:49:48  _____________________________________________________________________  External Attachment:    Type:   Image     Comment:   External Document

## 2011-01-26 NOTE — Progress Notes (Signed)
Summary: meds too high  Phone Note Call from Patient Call back at Home Phone [phone] Call back at [phone]   Caller: Daughter Reason for Call: Talk to Nurse Details for Reason: cost of meds is too high can pt get a something cheaper Initial call taken by: Lorne Skeens,  October 02, 2009 9:09 AM  Follow-up for Phone Call        Spoke with the pt's daughter and made her aware that we have already prescribed a cheaper alternative.  [facility] Diovan HCT [facility].  The pt should stop Avalide HCT when she starts Diovan HCT.  The daughter agrees with plan. Follow-up by: Julieta Gutting, RN, BSN,  October 02, 2009 9:35 AM

## 2011-01-26 NOTE — Assessment & Plan Note (Signed)
Summary: Pulmonary/ eval doe ? all Aortic valve dz   Primary Desiree Hutchinson/Referring Desiree Hutchinson:  Dr. Excell Seltzer  CC:  Acute visit.  Pt c/o increased sob x 3 months.  She states that she gets out of breath walking 10 steps.  Denies any other complaints today.Marland Kitchen  History of Present Illness: 6 yobf quit smoking 2003 per old chart (she says 1980's) knonwn history of HTN,  allergic rhinitis   June 26, 2009 indolent onset progressive doe x 3 months progressive to point to where sob x 50 ft, ok at night and supine, no sign cough or variablity or chest tightness/ reflux symptoms. Pt denies any significant sore throat,  excess nasal secretions, fever, chills, sweats, unintended wt loss, pleuritic or exertional cp, orthopnea pnd or leg swelling.  Pt also denies any obvious fluctuation in symptoms with weather or environmental change or other alleviating or aggravating factors.    No flare of allergy symptoms during the progressive sob though does have sensation of chronic nasal obstruction x years.     Current Medications (verified): 1)  Adult Aspirin Low Strength 81 Mg  Tbdp (Aspirin) .... Take 1 By Mouth Qd 2)  [facility]vs Saline Nasal Spray 0.65 %  Soln (Saline) .... Use As Directed 3)  Hydrochlorothiazide 12.5 Mg  Tabs (Hydrochlorothiazide) .... Take 1 By Mouth Once Daily 4)  Gas-X Extra Strength 125 Mg Caps (Simethicone) .... As Needed  Allergies (verified): 1)  * Univasc  Past History:  Past Medical History: ATHEROSCLEROTIC CARDIOVASCULAR DISEASE (ICD-429.2) HYPERTENSION (ICD-401.9) VENTRICULAR HYPERTROPHY, LEFT (ICD-429.3) GASTRITIS (ICD-535.50) COUGH (ICD-786.2) TOBACCO USE, QUIT (ICD-V15.82) OBESITY (ICD-278.00) CHEST PAIN, NON-CARDIAC (ICD-786.59) ALLERGIC RHINITIS (ICD-477.9) COPD (ICD-496)    - No PFT's on record SMALL BOWEL OBSTRUCTION, HX OF (ICD-V12.79)  Chronic diastolic heart failure.  Aortic Valve DZ  regurge > stenosis     - See Echo 06/10/09    Family History:  Negative for  coronary artery disease. no resp dz or atopic dz  Social History: The patient is a retired Tree surgeon.  She is a remote  smoker quit 2003  She does not drink alcohol.  Review of Systems  The patient denies shortness of breath at rest, productive cough, non-productive cough, coughing up blood, chest pain, irregular heartbeats, acid heartburn, indigestion, loss of appetite, weight change, abdominal pain, difficulty swallowing, sore throat, tooth/dental problems, headaches, nasal congestion/difficulty breathing through nose, sneezing, itching, ear ache, anxiety, depression, hand/feet swelling, joint stiffness or pain, rash, change in color of mucus, and fever.    Vital Signs:  Patient profile:   77 year old female Weight:      144 pounds O2 Sat:      99 % on Room air Temp:     98.1 degrees F oral Pulse rate:   72 / minute BP sitting:   136 / 54  (right arm)  Vitals Entered By: Vernie Murders (June 26, 2009 2:27 PM)  O2 Flow:  Room air  Physical Exam  Additional Exam:  wt 139 > 144  06/26/09 HEENT: nl dentition, turbinates, and orophanx. Nl external ear canals without cough reflex NECK :  without JVD/Nodes/TM/ nl carotid upstrokes bilaterally LUNGS: no acc muscle use, clear to A and P bilaterally without cough on insp or exp maneuvers CV:  RRR  II/VI sem with diastolic component, no  increase in P2, no edema   ABD:  soft and nontender with nl excursion in the supine position. No bruits or organomegaly, bowel sounds nl MS:  warm without deformities,  calf tenderness, cyanosis or clubbing SKIN: warm and dry without lesions   NEURO:  alert, approp, no deficits     Impression & Recommendations:  Problem # 1:  SHORTNESS OF BREATH (ICD-786.05)  Consistent with progressive valvular ht dz and diastolic dysfunction in pt with relatively high bp and no symptoms attributable to airways dz (absence of cough, noct symptoms or variability) s/p remote smoking cessation.  She's had nothing that  sounds like angina or presyncope so try adding afterload reduction pending re-eval by cardiology  Orders: New Patient Level V (76160)  Problem # 2:  CHRONIC RHINITIS (ICD-472.0)  trial of omnaris rec  Each maintenance medication was reviewed in detail including most importantly the difference between maintenance and as needed and under what circumstances the prns are to be used. See instructions for specific recommendations   Orders: New Patient Level V (73710)  Medications Added to Medication List This Visit: 1)  Avalide 150-12.5 Mg Tabs (Irbesartan-hydrochlorothiazide) 2)  Omnaris 50 Mcg/act Susp (Ciclesonide) .... Two puffs each nostril daily  Patient Instructions: 1)  stop hctz 2)  start avalide 150/ 12.5 one daily  3)  try omnimist nasal twice daily as needed 4)  Keep appt to see Excell Seltzer re: aortic valve issues Prescriptions: OMNARIS 50 MCG/ACT  SUSP (CICLESONIDE) Two puffs each nostril daily  #1 x 11   Entered and Authorized by:   Nyoka Cowden MD   Signed by:   Nyoka Cowden MD on 06/28/2009   Method used:   [facility]        [facility] [address] [phone]* (retail)       [address]       [place], [place]  [address]       Ph: [phone] or [phone]       Fax: [phone]   RxID:   [id number]   Appended Document: Pulmonary/ eval doe ? all Aortic valve dz cxr, echo and nuclear med study reviewed from 05/2009 and no change in imp/recs

## 2011-01-28 NOTE — Assessment & Plan Note (Signed)
Summary: 6 month rov   Visit Type:  6 months follow up Primary Provider:  Dr. Excell Seltzer  CC:  Sob.  History of Present Illness: This is an 77 year old woman who presents for followup of chronic dyspnea. Her dyspnea is unchanged since last visit here.  She denies orthopnea, PND, or edema. She's undergone an extensive cardiac evaluation, including a nuclear stress test which showed no ischemia. She had an echocardiogram that showed normal LV systolic function with diastolic parameters consistent with high LV filling pressures. She has moderately severe aortic stenosis by echo. Her BNP was normal.   She continues to have dyspnea with walking on level ground at short distance. She denies chest pain or pressure.   Current Medications (verified): 1)  Adult Aspirin Low Strength 81 Mg  Tbdp (Aspirin) .... Take 1 By Mouth Qd 2)  Gas-X Extra Strength 125 Mg Caps (Simethicone) .... As Needed 3)  Diovan Hct 80-12.5 Mg Tabs (Valsartan-Hydrochlorothiazide) .... Take 1 Tablet By Mouth Once A Day 4)  Hydrochlorothiazide 12.5 Mg Tabs (Hydrochlorothiazide) .... Take One Tablet By Mouth Daily. 5)  Nasal Spray 0.05 % Soln (Oxymetazoline Hcl) .... As Needed  Allergies: 1)  * Univasc  Past History:  Past medical history reviewed for relevance to current acute and chronic problems.  Past Medical History: Reviewed history from 06/26/2009 and no changes required. ATHEROSCLEROTIC CARDIOVASCULAR DISEASE (ICD-429.2) HYPERTENSION (ICD-401.9) VENTRICULAR HYPERTROPHY, LEFT (ICD-429.3) GASTRITIS (ICD-535.50) COUGH (ICD-786.2) TOBACCO USE, QUIT (ICD-V15.82) OBESITY (ICD-278.00) CHEST PAIN, NON-CARDIAC (ICD-786.59) ALLERGIC RHINITIS (ICD-477.9) COPD (ICD-496)    - No PFT's on record SMALL BOWEL OBSTRUCTION, HX OF (ICD-V12.79)  Chronic diastolic heart failure.  Aortic Valve DZ  regurge > stenosis     - See Echo 06/10/09    Review of Systems       Negative except as per HPI   Vital Signs:  Patient  profile:   77 year old female Height:      62 inches Weight:      140.50 pounds BMI:     25.79 Pulse rate:   81 / minute Pulse rhythm:   regular Resp:     18 per minute BP sitting:   110 / 60  (left arm) Cuff size:   regular  Vitals Entered By: Vikki Ports (January 07, 2011 3:01 PM)  Physical Exam  General:  Pt is alert and oriented, elderly, African-American woman, in no acute distress. HEENT: normal Neck: normal carotid upstrokes without bruits, JVP normal Lungs: CTA CV: RRR with 3/6 systolic murmur best heard at LSB Abd: soft, NT, positive BS, no bruit, no organomegaly Ext: no clubbing, cyanosis, or edema. peripheral pulses 2+ and equal Skin: warm and dry without rash    EKG  Procedure date:  01/07/2011  Findings:      NSr 81 bpm, LVH with repolarization abnormality.  Impression & Recommendations:  Problem # 1:  OTHER DYSPNEA AND RESPIRATORY ABNORMALITIES (ICD-786.09) Pt appears stable. Suspcet dyspnea is multifactorial...diastolic dysfunction, aortic stenosis, advanced age. Followup echocardiogram in July to reassess degree of aortic stenosis.  Her updated medication list for this problem includes:    Adult Aspirin Low Strength 81 Mg Tbdp (Aspirin) .Marland Kitchen... Take 1 by mouth qd    Diovan Hct 80-12.5 Mg Tabs (Valsartan-hydrochlorothiazide) .Marland Kitchen... Take 1 tablet by mouth once a day    Hydrochlorothiazide 12.5 Mg Tabs (Hydrochlorothiazide) .Marland Kitchen... Take one tablet by mouth daily.  Problem # 2:  HYPERTENSION (ICD-401.9) Remains well-controlled.  Her updated medication list for this problem includes:  Adult Aspirin Low Strength 81 Mg Tbdp (Aspirin) .Marland Kitchen... Take 1 by mouth qd    Diovan Hct 80-12.5 Mg Tabs (Valsartan-hydrochlorothiazide) .Marland Kitchen... Take 1 tablet by mouth once a day    Hydrochlorothiazide 12.5 Mg Tabs (Hydrochlorothiazide) .Marland Kitchen... Take one tablet by mouth daily.  BP today: 110/60 Prior BP: 102/58 (06/15/2010)  Labs Reviewed: K+: 3.8 (05/29/2009) Creat: : 1.0  (05/29/2009)   Chol: 228 (11/20/2008)   HDL: 70.9 (11/20/2008)   LDL: DEL (11/20/2008)   TG: 68 (11/20/2008)  Other Orders: EKG w/ Interpretation (93000)  Patient Instructions: 1)  Your physician recommends that you continue on your current medications as directed. Please refer to the Current Medication list given to you today. 2)  Your physician wants you to follow-up in: July 2012.  You will receive a reminder letter in the mail two months in advance. If you don't receive a letter, please call our office to schedule the follow-up appointment. 3)  Your physician has requested that you have an echocardiogram July 2012.  Echocardiography is a painless test that uses sound waves to create images of your heart. It provides your doctor with information about the size and shape of your heart and how well your heart's chambers and valves are working.  This procedure takes approximately one hour. There are no restrictions for this procedure. 4)  Dr Hazle Quant (eye) [phone] 5)  Dr Dorma Russell (ear) [phone]

## 2011-04-12 LAB — BASIC METABOLIC PANEL
BUN: 9 mg/dL (ref 6–23)
CO2: 24 mEq/L (ref 19–32)
Calcium: 8.6 mg/dL (ref 8.4–10.5)
Chloride: 105 mEq/L (ref 96–112)
Creatinine, Ser: 1.12 mg/dL (ref 0.4–1.2)
GFR calc Af Amer: 56 mL/min — ABNORMAL LOW (ref >60)
GFR calc non Af Amer: 46 mL/min — ABNORMAL LOW (ref >60)
Glucose, Bld: 94 mg/dL (ref 70–99)
Potassium: 3.7 mEq/L (ref 3.5–5.1)
Sodium: 137 mEq/L (ref 135–145)

## 2011-04-28 ENCOUNTER — Encounter: Payer: Self-pay | Admitting: Cardiovascular Disease

## 2011-05-11 NOTE — Assessment & Plan Note (Signed)
[facility]                            CARDIOLOGY OFFICE NOTE   DOSHIE, MAGGI                      MRN:          [id number]  DATE:08/30/2007                            DOB:          [birth year]    HISTORY OF PRESENT ILLNESS:  Alvilda Mckenna returns for followup at the  [facility] office on August 30, 2007. She is an 77 year old  woman with chronic exertional dyspnea. She has mild valvular heart  disease. Her most recent echocardiogram from November 03, 2006  demonstrated normal left ventricular systolic function with an LVEF of  55% to 60% with moderate aortic insufficiency and a calcified aortic  valve. There is also mild aortic valve stenosis and mild mitral  regurgitation.   From a symptomatic standpoint, she has done well. She has some dyspnea,  mainly when bending forward. She limits her activity level and with  regular activity around the home, is asymptomatic. When she gets out and  walks, she does admit to some dyspnea. She denies orthopnea, PND, chest  pain, light headedness, syncope, or palpitations.   CURRENT MEDICATIONS:  Aspirin 81 mg daily, Prilosec 20 mg daily, and  hydrochlorothiazide 12.5 mg daily.   PHYSICAL EXAMINATION:  GENERAL:  The patient is an alert, oriented,  elderly woman. No acute distress.  VITAL SIGNS:  Weight 151 pounds. Blood pressure 127/67, heart rate 68,  respiratory rate 16.  HEENT:  Normal.  NECK:  Normal carotid upstrokes without bruits. Jugular venous pressure  is normal.  LUNGS:  Clear to auscultation bilaterally.  HEART:  Regular rate and rhythm with 2 over 6 ejection murmur along the  left sternal border.  ABDOMEN:  Soft, nontender. No organomegaly.  EXTREMITIES:  No clubbing, cyanosis, or edema. Peripheral pulses are 2+  and equal throughout.   ASSESSMENT:  Ms. Seidenberg is currently stable from a cardiovascular  standpoint regarding her mild AS, moderate aortic insufficiency, and  chronic dyspnea. I did not make any changes to her medications today.  Her blood pressure is ideal. She does not have any signs of volume  overload or congestive heart failure. I plan on seeing her back in 6  months for regular followup.     Veverly Fells. Excell Seltzer, MD  Electronically Signed    MDC/MedQ  DD: 08/30/2007  DT: 08/30/2007  Job #: [id number]

## 2011-05-11 NOTE — Discharge Summary (Signed)
Desiree Hutchinson, Desiree Hutchinson               ACCOUNT NO.:  [id number]   MEDICAL RECORD NO.:  [id number]          PATIENT TYPE:  INP   LOCATION:  6710                         FACILITY:  [facility]   PHYSICIAN:  Rosalyn Gess. Norins, MD  DATE OF BIRTH:  15-Jul-1920   DATE OF ADMISSION:  12/23/2008  DATE OF DISCHARGE:  12/28/2008                               DISCHARGE SUMMARY   ADMITTING DIAGNOSES:  1. Small bowel obstruction.  2. Urinary tract infection.  3. Hypertension.  4. Chronic diastolic heart failure.  5. History of chronic obstructive pulmonary disease.   DISCHARGE DIAGNOSES:  1. Small bowel obstruction, resolved.  2. Urinary tract infection.  3. Hypertension.  4. Chronic diastolic heart failure.  5. History of chronic obstructive pulmonary disease.   CONSULTANTS:  None.   PROCEDURES:  1. Acute abdominal films on December 23, 2008, which showed      nonobstructive bowel gas pattern with no free air or similar to      lower  gaseous distention in the past.  A 10-mm nodular opacity in      the right apex, which may be bony artifact.  2. CT of the abdomen with contrast, which showed small bowel      obstruction with no evidence of ischemia.  3. CT exam of the pelvis, which showed question of small early right      inguinal hernia.  4. Abdominal film portable on December 24, 2008, showed motion      artifact.  Distal progression of contrast consistent with partial      or incomplete small bowel obstruction.  5. Abdominal x-ray on December 26, 2008, showed improving small bowel      obstruction with extensive descending and sigmoid colonic      diverticulosis.  6. Abdominal x-ray on December 29, 2007, which showed nonobstructive      bowel gas pattern with moderate stool and residual contrast in the      nondilated colon and diverticulosis.   HISTORY OF PRESENT ILLNESS:  The patient is an 77 year old African  American woman with history of hypertension who presented to [facility] with  a 3-day history of abdominal pain along with nausea, dry heaves.  She had had no BM for 3 days.  The patient had similar pain in the past  with a small bowel obstruction.  The patient had progressive pain and  this brought her to the emergency department for evaluation.  She  reports she had had flatus but no bowel movement.  The patient's initial  x-ray was suggestive of bowel obstruction and she was subsequently  admitted.  Please see the H and P for past medical history, family  history, social history, and examination.   ADMISSION LABORATORY DATA:  Significant for a white count of 6300.  Chemistries were unremarkable.   HOSPITAL COURSE:  The patient with small bowel obstruction.  This was  thought to be likely due to surgical adhesions.  The patient was treated  conservatively.  She was initially given Reglan, which was also  subsequently discontinued.  The patient had continued improvement.  Followup x-rays showed improvement.  Final x-ray on the day of discharge  showed resolution of her small bowel obstruction with a normal bowel gas  pattern.  At discharge exam, the patient was able to take a diet.  She  had gas but no bowel movement.  With the patient having resolution of  small bowel obstruction, she is thought to be stable and ready for  discharge home.   DISCHARGE PHYSICAL EXAMINATION:  VITAL SIGNS:  Temperature 97.9, blood  pressure 179/71, heart rate 64, respirations were 18, oxygen saturation  was 98% at room air.  GENERAL APPEARANCE:  A pleasant woman looking younger than stated  chronologic age in no acute distress.  HEENT:  Unremarkable.  CHEST:  The patient is moving air well.  No rales or wheezes were  appreciated.  There was no increased work of breathing.  CARDIOVASCULAR:  Quiet precordium with a regular rate and rhythm.  ABDOMEN:  The patient had positive bowel sounds in all 4 quadrants.  Her  abdomen was soft.  No guarding or rebound tenderness was noted.    FINAL CHEMISTRIES:  Basic metabolic panel from December 27, 2008, with a  sodium 137, potassium 3.7, chloride 105, CO2 24, BUN of 9, creatinine  1.12.  Glucose was 94.   ADDITIONAL LABORATORY:  The patient had a lipase on the day of  admission, which was normal.  Hepatic functions were normal.   DISCHARGE MEDICATIONS:  1. The patient will resume all of her home medications including      aspirin 81 mg daily, hydrochlorothiazide 12.5 mg daily, Prilosec 20      mg daily.  We will not sent home on Reglan and will change the      discharge instruction sheet.  2. The patient did have a UTI and was treated while in hospital with      Rocephin for 3 days with resolution of her symptoms.  3. Hypertension, remained stable.  4. The patient is discharged home.  She will follow up with her      primary care physician, Dr. [name] in 7-10 days.  5. The patient's condition at the time of discharge dictation is      stable and improved.      Rosalyn Gess Norins, MD  Electronically Signed     MEN/MEDQ  D:  12/28/2008  T:  12/29/2008  Job:  [id number]   cc:   Gregary Signs A. Everardo All, MD

## 2011-05-11 NOTE — Assessment & Plan Note (Signed)
[facility]                            CARDIOLOGY OFFICE NOTE   EDITH, GROLEAU                      MRN:          [id number]  DATE:05/29/2007                            DOB:          03/20/1920    SUBJECTIVE:  Ms. Desiree Hutchinson was seen in outpatient followup at the  [facility] on May 29, 2007.  She is an 77 year old woman  who has been previously evaluated for chronic exertional dyspnea.  Ms.  Fazzini was initially seen back in November.  She had been seen by Dr.  Casimiro Needle B. Wert and started on a  Micardis, hydrochlorothiazide  combination for her hypertension.  The exact etiology of her dyspnea has  been unclear, but I suspect she has had some contribution of diastolic  heart failure, based on electrocardiogram criteria for left ventricular  hypertrophy, as well as echocardiogram criteria.  Her beta natriuretic  peptide has been normal.  Her left ventricular systolic function has  also been normal.   At today's visit she has run out of her Micardis/hydrochlorothiazide and  not taken it in several months.  She takes Prilosec before meals and  feels that this has helped with her dyspnea.  She has no orthopnea, PND,  edema or chest pain.  She has no other complaints.   CURRENT MEDICATIONS:  1. Aspirin 81 mg daily.  2. Prilosec 20 mg daily.   PHYSICAL EXAMINATION:  GENERAL:  She is alert and oriented, in no acute  distress.  VITAL SIGNS:  Weight 150 pounds, blood pressure 121/63, heart rate 76,  respirations 16.  HEENT:  Normal.  NECK:  Normal carotid upstrokes without bruits.  Jugular venous pressure  is normal.  LUNGS:  Clear to auscultation bilaterally.  HEART:  A regular rate and rhythm without murmurs or gallops.  There is  no S3 or S4.  ABDOMEN:  Soft, nontender.  No organomegaly.  EXTREMITIES:  No clubbing, cyanosis or edema.  Peripheral pulses are 2+  and equal throughout.   ASSESSMENT/PLAN:  Ms. Scarola is  currently stable from a cardiovascular  standpoint, regarding her chronic exertional dyspnea.  Her symptoms are  unchanged.  While her blood pressure is normal at today's evaluation, I  still think she would benefit from at least a low dose diuretic, in the  setting of her dyspnea and longstanding hypertension.  She has  difficulty paying for her medication, and since we cannot reliably get  her samples of Micardis, I think it would be difficult for her to afford  this medicine.  Will start her back on hydrochlorothiazide 12.5 mg daily  with a basic metabolic panel in two weeks and a follow-up office visit  in three months.  She was  advised to increase the potassium in her diet, and we will keep a close  followup on her potassium level with a metabolic panel in a few weeks.     Veverly Fells. Excell Seltzer, MD  Electronically Signed    MDC/MedQ  DD: 05/29/2007  DT: 05/30/2007  Job #: [id number]   cc:   Charlaine Dalton. Sherene Sires, MD, FCCP

## 2011-05-11 NOTE — Assessment & Plan Note (Signed)
[facility]                            CARDIOLOGY OFFICE NOTE   LATOSHA, GAYLORD                      MRN:          [id number]  DATE:11/20/2008                            DOB:          01-09-1920    REASON FOR VISIT:  Chronic exertional dyspnea.   HISTORY OF PRESENT ILLNESS:  Desiree Hutchinson is a delightful 77 year old  woman with chronic diastolic heart failure and mild valvular disease.  She had an echocardiogram last in November 2007 that showed normal LV  size and systolic function with mild LVH.  Her LVEF was 55-60%.  There  was mild aortic stenosis and moderate aortic insufficiency noted as well  as mild mitral regurgitation.  The patient has been doing well from a  cardiovascular standpoint.  She remains active and does a lot of cooking  and housework.  She is able to do these household activities without  dyspnea.  She denies orthopnea, PND, or edema.  She has chronic  exertional dyspnea with moderate level activities.  She denies chest  pain or other complaints.   Medications include aspirin 81 mg daily, hydrochlorothiazide 12.5 mg  daily.   ALLERGIES:  UNIVASC causes cough.   PHYSICAL EXAMINATION:  GENERAL:  This is an elderly [place]  female, very pleasant in no acute distress.  VITAL SIGNS:  Weight is 145 pounds, blood pressure 132/70, heart rate  67, respiratory rate 16.  HEENT:  Normal.  NECK:  Normal carotid upstrokes.  No bruits.  JVP normal.  LUNGS:  Clear bilaterally.  HEART:  The apex is discrete and nondisplaced.  Heart is regular rate  and rhythm with a 2/6 systolic ejection murmur along the left sternal  border.  No gallops or diastolic murmurs are present.  ABDOMEN:  Soft, nontender.  No organomegaly.  EXTREMITIES:  No clubbing, cyanosis, or edema.  Peripheral pulses are  intact and equal.  SKIN:  Warm and dry without rash.   EKG shows normal sinus rhythm with an anterolateral T wave abnormality  as well as an  inferior T wave abnormality changes consistent with  ischemia.   ASSESSMENT:  1. Chronic dyspnea.  The patient is stable with no change in her      symptoms.  She is able to do her regular activities without      problems.  Clinically she has no evidence of volume overload.      Continue low-dose diuretic and low-dose aspirin.  Encouraged her to      stay as active as possible.  2. Abnormal EKG.  Again, Ms. Vanwyk has no symptoms.  She is getting      along well and I do not see any reason to do an investigation for      ischemia because I would not pursue cardiac catheterization in the      setting of her asymptomatic status.  3. Hypertension.  She remains on low-dose hydrochlorothiazide and has      a goal blood pressure.  We will check a metabolic panel.   For followup, I would like to see Ms. Yzaguirre back  in 6 months.  I have  encouraged her to see a primary care physician.  She does not go to  anyone regularly.     Veverly Fells. Excell Seltzer, MD  Electronically Signed    MDC/MedQ  DD: 11/20/2008  DT: 11/20/2008  Job #: [id number]

## 2011-05-14 NOTE — Assessment & Plan Note (Signed)
[facility]                            CARDIOLOGY OFFICE NOTE   Desiree, Hutchinson                      MRN:          [id number]  DATE:12/16/2006                            DOB:          1920-07-11    Desiree Hutchinson was seen back at the [facility] as an  outpatient on December 16, 2006.  She is a very pleasant 77 year old  woman with longstanding dyspnea who was initially referred by Dr. Sherene Sires  for cardiac evaluation in the setting of her dyspnea.  Her symptoms are  longstanding but have gradually worsened over the years.  Dr. Sherene Sires  started her on Micardis/hydrochlorothiazide at the time of his visit in  November.  She brings in blood pressure readings from home and has had  outstanding blood pressure control since this medication was started.  Her home blood pressure readings have ranged from 99 to 112 systolic  over 47 to 49 diastolic.  At today's visit she reports minor improvement  in her dyspnea but continues to have symptoms at fairly low levels of  activity.  She specifically denies chest pain, orthopnea, PND, edema,  palpitations, lightheadedness, or syncope.   Current medicines include:  1. Aspirin 81 mg daily.  2. Prevacid 30 mg daily.  3. Micardis/hydrochlorothiazide 40/12.5 mg one-half daily.   EXAMINATION TODAY:  GENERAL:  She is alert and oriented in no acute  distress.  VITAL SIGNS:  Her weight is 152 pounds, blood pressure is 110/65, heart  rate 75, respiratory rate 16.  HEENT:  Normal.  NECK:  Normal carotid upstrokes without bruits, jugular venous pressure  is normal.  LUNGS:  Clear to auscultation bilaterally.  CARDIOVASCULAR:  Regular rate and rhythm with a 2/6 systolic ejection  murmur at the right upper sternal border that radiates to the carotids.  There is no diastolic murmur or gallop present.  ABDOMEN:  Soft, nontender, no organomegaly.  EXTREMITIES:  No clubbing, cyanosis, or edema.  Peripheral pulses  are 2+  and equal throughout.   ASSESSMENT:  Desiree Hutchinson is an 77 year old woman with chronic exertional  dyspnea.  Her cardiac studies have been relative unremarkable.  She does  have longstanding hypertension.  She has EKG and echocardiogram criteria  for left ventricular hypertrophy.  While her blood pressure is currently  well controlled I suspect that diastolic dysfunction contributes to her  shortness of breath.  However, at this point she has no clinical signs  of congestive heart failure, she has no edema, and her physical exam  suggests that she is euvolemic.  Her BNP checked at the time of her last  visit was less than 30.  Her LV systolic function is normal.  I would  recommend continuing with her current medical therapy as she has blood  pressure in the ideal range at present.  I will check a basic metabolic  panel today to follow up on her creatinine and  potassium after starting Micardis and hydrochlorothiazide.  We will plan  on seeing her back in 6 months for followup.     Veverly Fells. Excell Seltzer, MD  Electronically  Signed    MDC/MedQ  DD: 12/16/2006  DT: 12/16/2006  Job #: [id number]   cc:   Casimiro Needle B. Sherene Sires, MD, FCCP

## 2011-05-14 NOTE — Letter (Signed)
Hutchinson 28, 2007    Desiree Hutchinson. Desiree Sires, MD, FCCP  [address]  [place], [place] [address]   RE:  Desiree, Hutchinson  MRN:  [id number]  /  DOB:  1920/01/23   Dear Desiree Hutchinson,   It was my pleasure to see Desiree Hutchinson at the cardiology clinic this  afternoon.  As you know, she is an 77 year old woman with longstanding  dyspnea.  It appears that you have followed her for several years  intermittently and that she has been treated for gastroesophageal reflux  disease.  She has not had any clear pulmonary problem, as she has had  unremarkable LFTs and atypical symptoms.  She recently had an  echocardiogram that demonstrated a mild increase in left ventricular  wall thickness as well as mild aortic stenosis and moderate aortic  regurgitation.   Today she relates a history of longstanding dyspnea, sometimes with  exertion and sometimes at rest.  Her symptoms are worse in the heat.  They seem to occur several times daily and last for a brief period of  time.  They are clearly not consistently related to activity.  She  denies orthopnea, PND, chest pain, or chest pressure.  She does not have  lower extremity edema.  She has no other complaints today.   Her past medical history is pertinent for recurrent small bowel  obstructions.  She had a ruptured appendix with peritonitis back in  1992.  She has hypertension and a history of dyslipidemia.  The duration  of her hypertension is not clear.   Current medications include Micardis/HCTZ 40/12.5 mg 1/2 daily and  Prevacid 30 mg daily.  Both of these were just started today.  She is  also on aspirin 81 mg daily.   ALLERGIES:  No drug allergies.   SOCIAL HISTORY:  The patient is a retired Tree surgeon.  She is a remote  smoker.  She does not drink alcohol.   FAMILY HISTORY:  Negative for coronary artery disease.   REVIEW OF SYSTEMS:  A complete 12-point review of systems is performed.  Pertinent positives include a hiatal hernia and gastroesophageal  reflux  disease.  There were no other positive symptoms to report.   PHYSICAL EXAMINATION:  GENERAL:  Patient is alert and oriented.  She is  an elderly woman in no acute distress.  VITAL SIGNS:  Weight is 155 pounds.  Blood pressure is 140/77, heart  rate 75, respiratory rate 16.  HEENT:  Normal.  NECK:  Normal carotid upstrokes without bruits.  Jugular venous pressure  is normal.  No thyromegaly or thyroid nodules.  LUNGS:  Clear to auscultation bilaterally.  CARDIOVASCULAR:  The apex is discrete and nondisplaced.  The heart is a  regular rate and rhythm with a 2/6 systolic ejection murmur at the right  upper sternal border.  There is no diastolic murmur.  There is no S3 or  S4 gallop present.  The apex is discrete and nondisplaced.  There is no  RV heave or lift.  ABDOMEN:  Soft and nontender.  No organomegaly.  No abdominal bruits.  EXTREMITIES:  No clubbing, cyanosis or edema.  Peripheral pulses are 2+  and equal throughout.  LYMPHATICS:  There is no adenopathy.  SKIN:  Warm and dry without rash.  NEUROLOGIC:  Grossly intact.  Cranial nerves II-XII are intact.  Strength is 5/5 in the arms and legs bilaterally.   EKG demonstrates normal sinus rhythm with left ventricular hypertrophy.  There are repolarization changes consistent with  LVH.   ASSESSMENT:  Desiree Hutchinson has chronic dyspnea.  She has not been found to  have any organic lung disease.  I think it is probable that she has  symptoms of diastolic heart failure.  Her EKG demonstrates left  ventricular hypertrophy, and her echocardiogram demonstrates increased  left ventricular wall thickness.  I suspect she has longstanding  hypertension that she has been only intermittently on medication for.  I  agree with the medical therapy that you started yesterday with Micardis  and hydrochlorothiazide.  This should be a good starting point for  treating her hypertension and may help with her breathlessness.  She  also has moderate  valvular heart disease; however, I am unable to  appreciate a diastolic murmur on her exam, and I do not think she has  hemodynamically significant aortic insufficiency.  Her left ventricular  cavity size is within normal limits.  Her valvular disease should be  followed with yearly echocardiography.  I will plan on seeing her back  in one month to see how she has responded to her antihypertensive  therapy.  She is going to obtain a home blood pressure cuff and record  blood pressure readings.  She will bring these back in next month when  she comes for her return visit.  She will likely continue to require  titration of her antihypertensive therapy.   Desiree Hutchinson, thanks again for allowing me to see Desiree Hutchinson.  Please feel free  to contact me at any time with questions regarding her care.    Sincerely,      Veverly Fells. Excell Seltzer, MD  Electronically Signed    MDC/MedQ  DD: 11/23/2006  DT: 11/24/2006  Job #: [id number]

## 2011-05-14 NOTE — H&P (Signed)
[name], Hutchinson               ACCOUNT NO.:  [id number]   MEDICAL RECORD NO.:  [id number]          PATIENT TYPE:  INP   LOCATION:  0364                         [facility]ACILITY[facility]  [facility]   PHYSICIAN:  Titus Dubin. Alwyn Ren, M.D. Romualdo Bolk OF BIRTH:  1919/12/30   DATE OF ADMISSION:  05/01/2005  DATE OF DISCHARGE:                                HISTORY & PHYSICAL   Desiree Hutchinson is an 77 year old Afro-American female admitted with small-  bowel obstruction.   On Friday, May 5 she began to have nausea and dry heaves.  Although this  resolved subsequently on May 6 she had left-sided abdominal pain which was  throbbing and sharp.  There was no radiation.  She did have a normal bowel  movement.  Because of persistence of pain she has come to the emergency  room.   Significantly, she ruptured the appendix in 1992.  She was in the hospital  for 6 weeks.  In 1997 she had small-bowel obstruction treated surgically by  Dr. Kendrick Ranch.   At this time she denies any dyspepsia, melena, rectal bleeding or other  change in stools.  She has had no genitourinary symptoms.  She denies any  chest pain related to the abdominal pain.   Other past history includes three pregnancies and three deliveries.  She  remotely has had a tonsillectomy.  She had a cyst removed from her hip.  She  is unsure as to whether she had a colonoscopy.   She is only on aspirin daily.   She does not smoke or drink.  She has no known drug allergies.   Family history is negative except for breast cancer in her daughter.   Review of systems is outlined above.  She has no genitourinary or  gynecologic symptoms but has not had gynecologic monitor for years.  She did  have a mammogram in 2005.   On exam, she is in no acute distress.  Blood pressure is 124/64, temperature  96.9.  Pulse is 84 and respiratory 18.  Arcus senilis and arteriolar  narrowing are present.  Otolaryngologic exam is unremarkable.  Nares are  patent with no  polyps.  She is edentulous.  She has a left carotid bruit.  The right thyroid is firm but not enlarged.  She has a grade 1 systolic  murmur.  She has rales bilaterally without increased work of breathing.  She  does describe a history of asthmatic bronchitis or possible reactive airways  disease related to esophageal reflux.  Apparently, she has seen Dr. Sherene Sires for  this.  Bowel sounds are decreased.  Abdomen is soft and tender on the left.  She has a midline operative scar.  Pedal pulses are intact.  She has no  edema.  Neurologic and psychiatric exam was unremarkable.  She is bright and  alert and very animated.  She is quite gregarious and kept calling me  Baby.  She does appear much younger than her stated age.  When I told her  she appeared 30 years younger than she is, she stated I can fool Mother  Ashby Dawes but not  Father Time.   She will be admitted with monitor.  NG will be initiated if she has  progressive distention or pain.  IV pain medication will be provided.  General surgery consult will be pursued.       [facility]/MEDQ  D:  05/02/2005  T:  05/02/2005  Job:  [id number]   cc:   Angelia Mould. Derrell Lolling, M.D.  [address], [address]  [place]  [place] [address]   Gregary Signs A. Everardo All, M.D. [facility]

## 2011-05-14 NOTE — Assessment & Plan Note (Signed)
[facility]                             PULMONARY OFFICE NOTE   Desiree Hutchinson, Desiree Hutchinson                      MRN:          [id number]  DATE:11/22/2006                            DOB:          10-11-1920    CHIEF COMPLAINT:  Dyspnea.   HISTORY:  An 77 year old black female who was seen here for evaluation  of 2 patterns of dyspnea, one occurring paroxysmally at rest and the  other occurring with exertion in 2005. She reports that she was  transiently better on PPI therapy for what I presumed was a component of  reflux but now has worsened over the last 6-8 months with both patterns  of dyspnea. One occurs paroxysmally, lasts for a few minutes, resolves  spontaneously and is associated with hoarseness and upper airway  wheezing that her daughter can  hear across the room. This is a  relatively minor issue. The larger issue is the she has become  progressively short of breath to the point where she cannot walk across  the parking lot without stopping to catch her breath. She denies any  associated chest pain or variability with weather or environmental  change and the problem has become progressively worse over the last 6-8  months. She denies having any associated orthopnea, PND, leg swelling,  fevers, chills, sweats, or significant cough.   PAST MEDICAL HISTORY:  Significant for obesity, hypertension, remote  smoking history, chronic rhinitis and noncardiac chest pain not  presently an issue.   ALLERGIES:  None known.   MEDICATIONS:  She is on no medications presently except for a baby  aspirin daily.   SOCIAL HISTORY:  She quit smoking in 2003.   FAMILY HISTORY:  Negative for heart disease.   REVIEW OF SYSTEMS:  Taken in detail and negative except as outlined  above.   PHYSICAL EXAMINATION:  GENERAL:  This is a pleasant, ambulatory, black  female in no acute distress.  VITAL SIGNS:  She had stable vital signs.  HEENT:  Unremarkable except  for a mild pseudowheeze. Oropharynx  is  clear.  NECK:  Supple without cervical adenopathy or tenderness. The trachea is  midline, no thyromegaly.  LUNGS:  Lung fields perfectly clear bilaterally to auscultation and  percussion.  HEART:  He has a regular rate and rhythm with a 1/6 systolic murmur.  ABDOMEN:  Soft and benign with no palpable organomegaly, mass or  tenderness.  EXTREMITIES:  Warm without calf tenderness, cyanosis, clubbing or edema.  NEUROLOGIC:  Except her affect was unusual and she did not really seem  to be processing what I was saying without her daughter helping  interpret it for her.   Heme saturation was 98% on room air.   Chest x-ray showed mild cardiomegaly with no infiltrates or effusions.  Chart review indicated an echocardiogram that was done on November 8  indicating that she has normal LV function but has mild to moderate  aortic valve calcification associated with mild aortic stenosis and  moderate aortic regurgitation with a left atrium that is mildly dilated  and also mild mitral valve regurgitation.  Lab data done by Dr. Everardo All on November 1 indicates a normal  urinalysis, LDL of 135. (The patient says she was treated with  medication she could not afford for this problem.) A normal TSH and  chemistry profile with a bicarb level of 30.   IMPRESSION:  1. Dyspnea is multifactorial. The paroxysms of dyspnea associated with      pseudowheeze that occur at rest are most likely reflux and I have      recommended she be started back on Prevacid 30 mg taken before      breakfast daily and reviewed dietary issues with her.  2. She may be developing significant symptomatic valvular heart      disease based on her progressive decline in exercise tolerate      associated with both aortic and mitral valve regurgitation. I      recommended a trial of Micardis 40/12.5, 1/2 daily and will arrange      for a cardiac evaluation and review of echocardiograph data.  (I      believe she will need to be followed long-term but hopefully      medical therapy will avoid the need for surgery.)   I would caution everyone that this patient and her daughter seem to  process medical information in a very unusual manner. The daughter is  actually a patient of mine and I have had the exact same problem with  her understanding and dealing with long-term health care issues in a  constructive/rational fashion which may be a problem with her mother as  well based on my interaction with the two of them today.   Followup here will be in four weeks or sooner if needed, regarding the  issue of paroxysmal dyspnea at rest and with the option of doing a  CPST to sort out why she is short of breath with exertion if cardiology  does not feel the problem is cardiac.     Desiree Dalton. Sherene Sires, MD, [facility]  Electronically Signed    MBW/MedQ  DD: 11/23/2006  DT: 11/23/2006  Job #: [id number]   cc:   Gregary Signs A. Everardo All, MD

## 2011-05-14 NOTE — Discharge Summary (Signed)
[name], Desiree Hutchinson               ACCOUNT NO.:  [id number]   MEDICAL RECORD NO.:  [id number]          PATIENT TYPE:  INP   LOCATION:  0364                         FACILITY:  [facility]   PHYSICIAN:  Rene Paci, M.D. LHCDATE OF BIRTH:  Jun 02, 1920   DATE OF ADMISSION:  05/01/2005  DATE OF DISCHARGE:  05/06/2005                                 DISCHARGE SUMMARY   DISCHARGE DIAGNOSES:  1.  Small-bowel obstruction, partial, resolving with conservative medical      management, status post surgical evaluation, likely due to adhesions.  2.  Nausea, vomiting, and abdominal pain, secondary to above, resolved  3.  History of ruptured appendix in 1992.  .  4.  History of obstruction with abdominal hernias in 1997.   DISCHARGE MEDICATIONS:  As prior to admission and include 1 aspirin daily.   CONDITION ON DISCHARGE:  The patient is medically stable, tolerating a  regular diet for greater than 24 hours without nausea, vomiting, or pain,  positive bowel movements.   HOSPITAL FOLLOWUP:  Arranged for evaluation with colonoscopy by Dr. Marina Goodell,  [facility] in 6-8 weeks as recommended by Dr. Ezzard Standing to complete evaluation,  scheduled May 26, 2005, at 10:30 a.m. for office visit.  Colonoscopy to be  scheduled at that time. The family is also welcome to call Dr. Everardo All,  primary care physician in approximately two weeks or as needed for routine  follow-up, also [facility] on an as-needed basis.   HOSPITAL COURSE BY PROBLEM:  Problem #1.  Abdominal pain with nausea and  vomiting. The patient is a pleasant 77 year old woman with multiple history  of abdominal surgeries who had evidence of partial small-bowel obstruction  and was admitted by the medical team, seen in consultation by general  surgery who agreed with conservative medical management, as the CT showed  contrast with passage through into the colon and thus not completely  obstructed. Over the next 48 hours, the patient improved, was  advanced to a  clear liquid diet, then solid diet without complications.  No NG tube  decompression required.  A KUB showed resolution, and the patient clinically  is also resolved. The patient is medically stable for discharge home, though  on the advice of Dr. Ezzard Standing will arrange for outpatient colonoscopy in 6-8  weeks to complete evaluation. The patient's daughter requested inpatient  evaluation, and I explained that we should wait the 6-8 weeks secondary to  need wait this time between this acute episode and further evaluation.  Discharge home today medically stable if okay with surgery. Outpatient  followup with GI as dictated above. No other medical changes in her  hospitalization or home medications.      VL/MEDQ  D:  05/06/2005  T:  05/06/2005  Job:  [id number]

## 2011-05-14 NOTE — Consult Note (Signed)
Desiree Hutchinson, Desiree Hutchinson               ACCOUNT NO.:  [id number]   MEDICAL RECORD NO.:  [id number]          PATIENT TYPE:  INP   LOCATION:  0364                         FACILITY:  [facility]   PHYSICIAN:  Sandria Bales. Ezzard Standing, M.D.  DATE OF BIRTH:  04-09-1920   DATE OF CONSULTATION:  05/02/2005  DATE OF DISCHARGE:                                   CONSULTATION   REASON FOR CONSULTATION:  Bowel obstruction.   HISTORY OF PRESENT ILLNESS:  [name] is an 77 year old black female who  is a patient of Dr. Gregary Signs Ellison's.  She has presented with approximately 48-  hour history of some nausea,  vomiting, increasing epigastric abdominal  pain.   A CT scan has been done and reveals some dilated small bowel consistent with  partial bowel obstruction, though she has does have contrast which has  gotten around to her colon so this bowel obstruction is not complete.   PAST SURGICAL HISTORY:  1.  She had a ruptured appendix operated on by Dr. Kendrick Ranch in 1992.  2.  She then developed a bowel obstruction with abdominal hernias in 1997      and Dr. Earlene Plater then repaired these hernias and fixed this bowel      obstruction.  She has done well for the last nine years with no symptoms      or GI complaints.   She denies history of peptic ulcer disease.  She at one time was treated for  gastroesophageal reflux disease and had seen Dr. Sherene Sires for some chronic  pulmonary disease he thought may be due to reflux, but she is not on any  medicine for reflux now.  She has never had an upper endoscopy.  She has  never had any liver disease or hepatitis, pancreatic disease.  She thinks  she may have had either a sigmoidoscopy or colonoscopy remotely but could  not remember either the date or the surgeon who did it.   PAST MEDICAL HISTORY:   ALLERGIES:  She has no allergies.   MEDICATIONS:  Her only medication includes an aspirin tablet.   REVIEW OF SYSTEMS:  NEUROLOGIC:  No seizure or loss of consciousness.  PULMONARY:  She said she had this period of asthma when she saw Dr. Sherene Sires  several years ago, but right now is having no pulmonary complaints and not  being followed by Dr. Sherene Sires chronically.  CARDIAC:  No significant heart disease, chest pain, or hypertension.  GASTROINTESTINAL:  See history of present illness.  UROLOGIC:  No history of kidney stones or kidney infections.  GYN:  She had three children.  Her son is now dead.  She now has a living  daughter who is in the room who is 20 and then a younger daughter who is 64  years of age.  She lives by herself at home.   PHYSICAL EXAMINATION:  VITAL SIGNS:  Her temperature is 98.6, pulse is 62,  her blood pressure is 149/68.  GENERAL:  She is a well-nourished black female, alert and cooperative.  HEENT:  Unremarkable.  NECK:  Supple.  I feel  no mass, no thyromegaly.  LUNGS:  Clear to auscultation.  HEART:  Regular rate and rhythm.  I hear no murmur or rub.  BREASTS:  Her breasts are symmetric without mass.  ABDOMEN:  She has a well-healed benign incision.  I do not feel any hernias.  She does have some very vague left lower quadrant soreness.  She has no  guarding, no rebound or organomegaly.  RECTAL:  I did her rectal exam which was negative without palpable rectal  masses, though she does have some hard stool in the rectal vault.  EXTREMITIES:  She has good strength in her upper and lower extremities.   LABORATORY DATA:  White blood count 5900, hemoglobin 13, hematocrit 40.  Her  sodium is 140, potassium 3.7, chloride 109, CO2 26.  Her liver functions  were within normal limits.  Her total protein is 6.4, albumin 3.4.  Lipase  is 25.  Urinalysis was negative.   DIAGNOSES:  1.  Partial small bowel obstruction.  Agree with current plan for bowel rest      and keep her n.p.o.  IV fluids and review KUB tomorrow.  I discussed      this with the patient and her family.  And I talked to Dr. Rene Paci by phone.  2.  History of  multiple prior abdominal operations without any obvious      hernia or masses at this time.      DHN/MEDQ  D:  05/02/2005  T:  05/02/2005  Job:  [id number]   cc:   Gregary Signs A. Everardo All, M.D. [facility]   Titus Dubin. Alwyn Ren, M.D. [facility]

## 2011-07-02 ENCOUNTER — Telehealth: Payer: Self-pay | Admitting: Cardiovascular Disease

## 2011-07-02 NOTE — Telephone Encounter (Signed)
Per daughter - pt has been c/u itching and noticed that she is taking Diovan/HCT 80-12.5 mg and HCTZ 12.5 mg.  She is concerned that she is taking too much.  Instructed daughter that frequently MD's want pts to be on 25 mg a day of HCTZ however Diovan doesn't come in that strength and that's the reason she is taking a separate dose of HTCZ.  Pt is not having any complaints other than some dryness of her skin.  Pt does have an appt scheduled for 08/26/11 with Dr Excell Seltzer and will keep that appt.

## 2011-07-02 NOTE — Telephone Encounter (Signed)
Per pt daughter calling . C/O itching.

## 2011-07-26 ENCOUNTER — Encounter: Payer: Self-pay | Admitting: Cardiovascular Disease

## 2011-07-26 ENCOUNTER — Other Ambulatory Visit: Payer: Self-pay | Admitting: Cardiovascular Disease

## 2011-08-26 ENCOUNTER — Ambulatory Visit (INDEPENDENT_AMBULATORY_CARE_PROVIDER_SITE_OTHER): Payer: Medicare Other | Admitting: Cardiovascular Disease

## 2011-08-26 ENCOUNTER — Encounter: Payer: Self-pay | Admitting: Cardiovascular Disease

## 2011-08-26 DIAGNOSIS — I359 Nonrheumatic aortic valve disorder, unspecified: Secondary | ICD-10-CM

## 2011-08-26 DIAGNOSIS — R0989 Other specified symptoms and signs involving the circulatory and respiratory systems: Secondary | ICD-10-CM

## 2011-08-26 DIAGNOSIS — Z136 Encounter for screening for cardiovascular disorders: Secondary | ICD-10-CM

## 2011-08-26 DIAGNOSIS — I10 Essential (primary) hypertension: Secondary | ICD-10-CM

## 2011-08-26 DIAGNOSIS — R0609 Other forms of dyspnea: Secondary | ICD-10-CM

## 2011-08-26 DIAGNOSIS — R0602 Shortness of breath: Secondary | ICD-10-CM

## 2011-08-26 NOTE — Patient Instructions (Signed)
Your physician wants you to follow-up in: 6 months. You will receive a reminder letter in the mail two months in advance. If you don't receive a letter, please call our office to schedule the follow-up appointment.  Your physician has recommended you make the following change in your medication: Stop HCTZ.  Increase fluid intake.  Use hydrocortisone cream to neck as needed. You can buy this over the counter.  Follow instructions on label.

## 2011-08-27 LAB — HEPATIC FUNCTION PANEL
ALT: 12 U/L (ref 0–35)
AST: 21 U/L (ref 0–37)
Albumin: 4.4 g/dL (ref 3.5–5.2)
Alkaline Phosphatase: 73 U/L (ref 39–117)
Bilirubin, Direct: 0 mg/dL (ref 0.0–0.3)
Total Bilirubin: 0.4 mg/dL (ref 0.3–1.2)
Total Protein: 7.7 g/dL (ref 6.0–8.3)

## 2011-08-27 LAB — BASIC METABOLIC PANEL
BUN: 42 mg/dL — ABNORMAL HIGH (ref 6–23)
CO2: 25 mEq/L (ref 19–32)
Calcium: 9.8 mg/dL (ref 8.4–10.5)
Chloride: 108 mEq/L (ref 96–112)
Creatinine, Ser: 1.6 mg/dL — ABNORMAL HIGH (ref 0.4–1.2)
GFR: 37.89 mL/min — ABNORMAL LOW (ref >60.00)
Glucose, Bld: 82 mg/dL (ref 70–99)
Potassium: 4.3 mEq/L (ref 3.5–5.1)
Sodium: 140 mEq/L (ref 135–145)

## 2011-08-27 LAB — CBC WITH DIFFERENTIAL/PLATELET
Basophils Absolute: 0 10*3/uL (ref 0.0–0.1)
Basophils Relative: 0.3 % (ref 0.0–3.0)
Eosinophils Absolute: 0.1 10*3/uL (ref 0.0–0.7)
Eosinophils Relative: 1.8 % (ref 0.0–5.0)
HCT: 37.3 % (ref 36.0–46.0)
Hemoglobin: 12.5 g/dL (ref 12.0–15.0)
Lymphocytes Relative: 40.4 % (ref 12.0–46.0)
Lymphs Abs: 2.5 10*3/uL (ref 0.7–4.0)
MCHC: 33.5 g/dL (ref 30.0–36.0)
MCV: 94 fl (ref 78.0–100.0)
Monocytes Absolute: 0.3 10*3/uL (ref 0.1–1.0)
Monocytes Relative: 5.6 % (ref 3.0–12.0)
Neutro Abs: 3.2 10*3/uL (ref 1.4–7.7)
Neutrophils Relative %: 51.9 % (ref 43.0–77.0)
Platelets: 214 10*3/uL (ref 150.0–400.0)
RBC: 3.97 Mil/uL (ref 3.87–5.11)
RDW: 14.2 % (ref 11.5–14.6)
WBC: 6.1 10*3/uL (ref 4.5–10.5)

## 2011-08-31 ENCOUNTER — Encounter: Payer: Self-pay | Admitting: Cardiovascular Disease

## 2011-08-31 NOTE — Progress Notes (Signed)
HPI:  This is an 77 year old woman presenting for followup evaluation. The patient has chronic shortness of breath. She denies orthopnea, PND, edema, or chest pain. There is no change in her chronic dyspnea.  Her prior evaluation has consisted of a nuclear scan and echocardiogram.  She had no ischemia on her nuclear study. Her left ventricular function is normal. The patient has mild aortic stenosis with diastolic dysfunction.  Her main complaint is itching of her neck. She complains of very dry skin. She wonders if her medication is making her itch.  Outpatient Encounter Prescriptions as of 08/26/2011  Medication Sig Dispense Refill  . aspirin 81 MG tablet Take 81 mg by mouth daily.        Marland Kitchen DIOVAN HCT 80-12.5 MG per tablet TAKE 1 TABLET BY MOUTH DAILY  90 tablet  3  . oxymetazoline (AFRIN) 0.05 % nasal spray Place 2 sprays into the nose as needed.        Marland Kitchen DISCONTD: hydrochlorothiazide (HYDRODIURIL) 12.5 MG tablet Take 12.5 mg by mouth daily.        Marland Kitchen DISCONTD: hydrochlorothiazide (,MICROZIDE/HYDRODIURIL,) 12.5 MG capsule TAKE 1 CAPSULE BY MOUTH DAILY  90 capsule  3  . DISCONTD: simethicone (MYLICON) 125 MG chewable tablet Chew 125 mg by mouth as needed.        Marland Kitchen DISCONTD: valsartan-hydrochlorothiazide (DIOVAN-HCT) 80-12.5 MG per tablet Take 1 tablet by mouth daily.          No Known Allergies  Past Medical History  Diagnosis Date  . History of atherosclerotic cardiovascular disease   . Hypertension   . Left ventricular hypertrophy   . Gastritis   . Cough   . Personal history of tobacco use, presenting hazards to health   . Obesity   . Chest pain, non-cardiac   . Allergic rhinitis   . COPD (chronic obstructive pulmonary disease)   . History of small bowel obstruction   . Chronic diastolic heart failure   . Aortic valve disease     regurge > stenosis    ROS: Negative except as per HPI  BP 122/64  Pulse 72  Ht 5\' 2"  (1.575 m)  Wt 134 lb (60.782 kg)  BMI 24.51 kg/m2  PHYSICAL  EXAM: Pt is alert and oriented, elderly woman in NAD HEENT: normal Neck: JVP - normal, carotids 2+= without bruits Lungs: CTA bilaterally CV: RRR with a grade 2/6 systolic ejection murmur at the right upper sternal border radiating to the neck Abd: soft, NT, Positive BS, no hepatomegaly Ext: no C/C/E, distal pulses intact and equal Skin: warm/dry no rash. Skin is dry. There is no obvious rash.  EKG:  Normal sinus rhythm 72 beats per minute, diffuse T wave abnormality consider inferolateral ischemia.  ASSESSMENT AND PLAN:

## 2011-08-31 NOTE — Assessment & Plan Note (Signed)
Blood pressure remains well-controlled. We'll discontinue her extra dose of hydrochlorothiazide as her skin is very dry this may be contributing to her rash. She will remain on Diovan HCT 80/12.5 mg daily.

## 2011-08-31 NOTE — Assessment & Plan Note (Signed)
Likely multifactorial in this woman with advanced age. She continues to have good control of her blood pressure and no evidence of volume overload on exam. Continue with observation.

## 2011-08-31 NOTE — Assessment & Plan Note (Signed)
Mild aortic stenosis. Exam is consistent with this. Continue observation.

## 2011-09-01 ENCOUNTER — Telehealth: Payer: Self-pay | Admitting: *Deleted

## 2011-09-01 DIAGNOSIS — I10 Essential (primary) hypertension: Secondary | ICD-10-CM

## 2011-09-01 NOTE — Telephone Encounter (Signed)
Reviewed lab results from 08/26/11 with Dr. Excell Seltzer. Per Dr. Excell Seltzer pt to stop Diovan-HCT. I spoke with pt's daughter and gave her these instructions and reviewed lab results.  Daughter also aware pt is not to resume HCTZ (this was stopped at office visit 08/26/11). She will bring pt to office for BMP on September 13, 2011.  --see lab results for additional documentation.

## 2011-09-13 ENCOUNTER — Other Ambulatory Visit (INDEPENDENT_AMBULATORY_CARE_PROVIDER_SITE_OTHER): Payer: Medicare Other | Admitting: *Deleted

## 2011-09-13 DIAGNOSIS — I10 Essential (primary) hypertension: Secondary | ICD-10-CM

## 2011-09-13 LAB — BASIC METABOLIC PANEL
BUN: 18 mg/dL (ref 6–23)
CO2: 25 mEq/L (ref 19–32)
Calcium: 9.7 mg/dL (ref 8.4–10.5)
Chloride: 109 mEq/L (ref 96–112)
Creatinine, Ser: 1.2 mg/dL (ref 0.4–1.2)
GFR: 54.33 mL/min — ABNORMAL LOW (ref >60.00)
Glucose, Bld: 86 mg/dL (ref 70–99)
Potassium: 3.9 mEq/L (ref 3.5–5.1)
Sodium: 140 mEq/L (ref 135–145)

## 2011-10-01 LAB — URINE CULTURE: Colony Count: 50000

## 2011-10-01 LAB — DIFFERENTIAL
Basophils Absolute: 0 10*3/uL (ref 0.0–0.1)
Basophils Relative: 0 % (ref 0–1)
Eosinophils Absolute: 0 10*3/uL (ref 0.0–0.7)
Eosinophils Relative: 1 % (ref 0–5)
Lymphocytes Relative: 27 % (ref 12–46)
Lymphs Abs: 1.7 10*3/uL (ref 0.7–4.0)
Monocytes Absolute: 0.3 10*3/uL (ref 0.1–1.0)
Monocytes Relative: 5 % (ref 3–12)
Neutro Abs: 4.2 10*3/uL (ref 1.7–7.7)
Neutrophils Relative %: 67 % (ref 43–77)

## 2011-10-01 LAB — CBC
HCT: 35.2 % — ABNORMAL LOW (ref 36.0–46.0)
HCT: 41.8 % (ref 36.0–46.0)
Hemoglobin: 11.8 g/dL — ABNORMAL LOW (ref 12.0–15.0)
Hemoglobin: 14.2 g/dL (ref 12.0–15.0)
MCHC: 33.4 g/dL (ref 30.0–36.0)
MCHC: 34 g/dL (ref 30.0–36.0)
MCV: 94.8 fL (ref 78.0–100.0)
MCV: 96.3 fL (ref 78.0–100.0)
Platelets: 134 10*3/uL — ABNORMAL LOW (ref 150–400)
Platelets: 164 10*3/uL (ref 150–400)
RBC: 3.65 MIL/uL — ABNORMAL LOW (ref 3.87–5.11)
RBC: 4.41 MIL/uL (ref 3.87–5.11)
RDW: 14 % (ref 11.5–15.5)
RDW: 14.1 % (ref 11.5–15.5)
WBC: 5.2 10*3/uL (ref 4.0–10.5)
WBC: 6.3 10*3/uL (ref 4.0–10.5)

## 2011-10-01 LAB — URINALYSIS, ROUTINE W REFLEX MICROSCOPIC
Glucose, UA: NEGATIVE mg/dL
Hgb urine dipstick: NEGATIVE
Ketones, ur: 15 mg/dL — AB
Nitrite: NEGATIVE
Protein, ur: NEGATIVE mg/dL
Specific Gravity, Urine: 1.024 (ref 1.005–1.030)
Urobilinogen, UA: 1 mg/dL (ref 0.0–1.0)
pH: 5.5 (ref 5.0–8.0)

## 2011-10-01 LAB — HEPATIC FUNCTION PANEL
ALT: 13 U/L (ref 0–35)
AST: 22 U/L (ref 0–37)
Albumin: 3.8 g/dL (ref 3.5–5.2)
Alkaline Phosphatase: 77 U/L (ref 39–117)
Bilirubin, Direct: 0.2 mg/dL (ref 0.0–0.3)
Indirect Bilirubin: 0.8 mg/dL (ref 0.3–0.9)
Total Bilirubin: 1 mg/dL (ref 0.3–1.2)
Total Protein: 7.1 g/dL (ref 6.0–8.3)

## 2011-10-01 LAB — POCT I-STAT, CHEM 8
BUN: 11 mg/dL (ref 6–23)
Calcium, Ion: 1.19 mmol/L (ref 1.12–1.32)
Chloride: 103 mEq/L (ref 96–112)
Creatinine, Ser: 1.2 mg/dL (ref 0.4–1.2)
Glucose, Bld: 139 mg/dL — ABNORMAL HIGH (ref 70–99)
HCT: 43 % (ref 36.0–46.0)
Hemoglobin: 14.6 g/dL (ref 12.0–15.0)
Potassium: 3.8 mEq/L (ref 3.5–5.1)
Sodium: 137 mEq/L (ref 135–145)
TCO2: 25 mmol/L (ref 0–100)

## 2011-10-01 LAB — BASIC METABOLIC PANEL
BUN: 11 mg/dL (ref 6–23)
CO2: 25 mEq/L (ref 19–32)
Calcium: 8.8 mg/dL (ref 8.4–10.5)
Chloride: 111 mEq/L (ref 96–112)
Creatinine, Ser: 1.03 mg/dL (ref 0.4–1.2)
GFR calc Af Amer: >60 mL/min (ref >60)
GFR calc non Af Amer: 51 mL/min — ABNORMAL LOW (ref >60)
Glucose, Bld: 72 mg/dL (ref 70–99)
Potassium: 3.8 mEq/L (ref 3.5–5.1)
Sodium: 140 mEq/L (ref 135–145)

## 2011-10-01 LAB — URINE MICROSCOPIC-ADD ON

## 2011-10-01 LAB — LIPASE, BLOOD: Lipase: 24 U/L (ref 11–59)

## 2012-02-10 ENCOUNTER — Ambulatory Visit (INDEPENDENT_AMBULATORY_CARE_PROVIDER_SITE_OTHER): Payer: Medicare Other | Admitting: Cardiovascular Disease

## 2012-02-10 ENCOUNTER — Encounter: Payer: Self-pay | Admitting: Cardiovascular Disease

## 2012-02-10 VITALS — BP 150/70 | Ht 62.0 in | Wt 133.0 lb

## 2012-02-10 DIAGNOSIS — I10 Essential (primary) hypertension: Secondary | ICD-10-CM

## 2012-02-10 DIAGNOSIS — I359 Nonrheumatic aortic valve disorder, unspecified: Secondary | ICD-10-CM | POA: Diagnosis not present

## 2012-02-10 LAB — BASIC METABOLIC PANEL
BUN: 22 mg/dL (ref 6–23)
CO2: 25 mEq/L (ref 19–32)
Calcium: 9.8 mg/dL (ref 8.4–10.5)
Chloride: 109 mEq/L (ref 96–112)
Creatinine, Ser: 1.3 mg/dL — ABNORMAL HIGH (ref 0.4–1.2)
GFR: 50.38 mL/min — ABNORMAL LOW (ref >60.00)
Glucose, Bld: 78 mg/dL (ref 70–99)
Potassium: 4.2 mEq/L (ref 3.5–5.1)
Sodium: 141 mEq/L (ref 135–145)

## 2012-02-10 NOTE — Patient Instructions (Signed)
Your physician wants you to follow-up in: 6 MONTHS. You will receive a reminder letter in the mail two months in advance. If you don't receive a letter, please call our office to schedule the follow-up appointment.  Your physician recommends that you have lab work today: BMP  Your physician recommends that you continue on your current medications as directed. Please refer to the Current Medication list given to you today.   

## 2012-02-10 NOTE — Progress Notes (Signed)
HPI:  78 year old woman presented for follow up evaluation. The patient has chronic dyspnea, mild aortic stenosis, and diastolic dysfunction. She has no symptoms at rest. She denies orthopnea or PND. She has no chest pain or pressure. She has stable dyspnea with low to moderate level physical activity such as cooking or light housework.  The patient has had borderline hypertension and has previously been treated with ARB's and diuretics. She has been off medication now for several months. She had developed renal insufficiency, but kidney function normalized after stopping her medication.  Outpatient Encounter Prescriptions as of 02/10/2012  Medication Sig Dispense Refill  . aspirin 81 MG tablet Take 81 mg by mouth daily.        Marland Kitchen oxymetazoline (AFRIN) 0.05 % nasal spray Place 2 sprays into the nose as needed.          No Known Allergies  Past Medical History  Diagnosis Date  . History of atherosclerotic cardiovascular disease   . Hypertension   . Left ventricular hypertrophy   . Gastritis   . Cough   . Personal history of tobacco use, presenting hazards to health   . Obesity   . Chest pain, non-cardiac   . Allergic rhinitis   . COPD (chronic obstructive pulmonary disease)   . History of small bowel obstruction   . Chronic diastolic heart failure   . Aortic valve disease     regurge > stenosis    ROS: Negative except as per HPI  BP 150/70  Ht 5\' 2"  (1.575 m)  Wt 60.328 kg (133 lb)  BMI 24.33 kg/m2  PHYSICAL EXAM: Pt is alert and oriented, very pleasant elderly woman in NAD HEENT: normal Neck: JVP - normal, carotids 2+= without bruits Lungs: CTA bilaterally CV: RRR with grade 2/6 systolic murmur at the right upper sternal border Abd: soft, NT, Positive BS, no hepatomegaly Ext: no C/C/E, distal pulses intact and equal Skin: warm/dry no rash  EKG:  Normal sinus rhythm 65 beats per minute, T wave abnormality consider inferolateral ischemia. No significant change from previous  tracing  ASSESSMENT AND PLAN:

## 2012-02-10 NOTE — Assessment & Plan Note (Signed)
The patient has mild aortic stenosis by exam and by echo. There does not appear to be any appreciable change. Continue to follow.

## 2012-02-10 NOTE — Assessment & Plan Note (Signed)
The patient has systolic hypertension with a blood pressure today 150. I reviewed her lab work and I think it a age 78 it is reasonable to continue to stay off of medications as the risk of developing renal dysfunction outweighs the potential benefit of ideal blood pressure control. She will followup in 6 months.

## 2012-08-25 ENCOUNTER — Ambulatory Visit (INDEPENDENT_AMBULATORY_CARE_PROVIDER_SITE_OTHER): Payer: Medicare Other | Admitting: Cardiovascular Disease

## 2012-08-25 ENCOUNTER — Encounter: Payer: Self-pay | Admitting: Cardiovascular Disease

## 2012-08-25 VITALS — BP 179/76 | HR 66 | Ht 62.0 in | Wt 132.0 lb

## 2012-08-25 DIAGNOSIS — R0602 Shortness of breath: Secondary | ICD-10-CM | POA: Diagnosis not present

## 2012-08-25 DIAGNOSIS — I359 Nonrheumatic aortic valve disorder, unspecified: Secondary | ICD-10-CM | POA: Diagnosis not present

## 2012-08-25 DIAGNOSIS — I10 Essential (primary) hypertension: Secondary | ICD-10-CM

## 2012-08-25 DIAGNOSIS — I350 Nonrheumatic aortic (valve) stenosis: Secondary | ICD-10-CM

## 2012-08-25 MED ORDER — AMLODIPINE BESYLATE 5 MG PO TABS: 5.0000 mg | ORAL_TABLET | Freq: Every day | ORAL | Status: DC

## 2012-08-25 NOTE — Assessment & Plan Note (Signed)
Blood pressure control is suboptimal. I reviewed her previous medication trials and she had renal insufficiency with hydrochlorothiazide. I would recommend a trial of amlodipine 5 mg daily.

## 2012-08-25 NOTE — Assessment & Plan Note (Signed)
Chronic problem. She notes slow progression. Will check echocardiogram. She has significant LVH and will also reassess her aortic stenosis. Will try to treat blood pressure is much as she can tolerate. Her exam shows clear lung fields and I do not think pulmonary edema is an issue.

## 2012-08-25 NOTE — Patient Instructions (Addendum)
Start Amlodipine 5mg  daily.  Your physician has requested that you have an echocardiogram. Echocardiography is a painless test that uses sound waves to create images of your heart. It provides your doctor with information about the size and shape of your heart and how well your heart's chambers and valves are working. This procedure takes approximately one hour. There are no restrictions for this procedure.  Your physician wants you to follow-up in: 6 months with Dr. Excell Seltzer.  You will receive a reminder letter in the mail two months in advance. If you don't receive a letter, please call our office to schedule the follow-up appointment.

## 2012-08-25 NOTE — Progress Notes (Signed)
   HPI:  78 year old woman presenting for followup of chronic dyspnea, diastolic dysfunction, and mild aortic stenosis.  Outpatient Encounter Prescriptions as of 08/25/2012  Medication Sig Dispense Refill  . aspirin 81 MG tablet Take 81 mg by mouth daily.        Marland Kitchen oxymetazoline (AFRIN) 0.05 % nasal spray Place 2 sprays into the nose as needed.          No Known Allergies  Past Medical History  Diagnosis Date  . History of atherosclerotic cardiovascular disease   . Hypertension   . Left ventricular hypertrophy   . Gastritis   . Cough   . Personal history of tobacco use, presenting hazards to health   . Obesity   . Chest pain, non-cardiac   . Allergic rhinitis   . COPD (chronic obstructive pulmonary disease)   . History of small bowel obstruction   . Chronic diastolic heart failure   . Aortic valve disease     regurge > stenosis    ROS: Negative except as per HPI  BP 179/76  Pulse 66  Ht 5\' 2"  (1.575 m)  Wt 59.875 kg (132 lb)  BMI 24.14 kg/m2  PHYSICAL EXAM: Pt is alert and oriented, pleasant elderly woman in NAD HEENT: normal Neck: JVP - normal, carotids 2+= without bruits Lungs: CTA bilaterally CV: RRR with grade 2/6 harsh crescendo decrescendo systolic murmur with preserved aortic closure sounds Abd: soft, NT, Positive BS, no hepatomegaly Ext: no C/C/E, distal pulses intact and equal Skin: warm/dry no rash  EKG:  Normal sinus rhythm 66 beats per minute. Nonspecific T wave abnormality.  ASSESSMENT AND PLAN:

## 2012-08-25 NOTE — Assessment & Plan Note (Signed)
The patient has mixed aortic valve disease with moderate aortic stenosis and insufficiency. I do not hear an aortic insufficiency murmur on exam. She has slowly progressive dyspnea. I reviewed her last echo from 2011. I would recommend a repeat echo as it has been 2 years since her previous. I'll see her back in followup in 6 months.

## 2012-08-31 ENCOUNTER — Ambulatory Visit (HOSPITAL_COMMUNITY): Payer: Medicare Other | Attending: Cardiology | Admitting: Radiology

## 2012-08-31 DIAGNOSIS — J449 Chronic obstructive pulmonary disease, unspecified: Secondary | ICD-10-CM | POA: Insufficient documentation

## 2012-08-31 DIAGNOSIS — I350 Nonrheumatic aortic (valve) stenosis: Secondary | ICD-10-CM

## 2012-08-31 DIAGNOSIS — I079 Rheumatic tricuspid valve disease, unspecified: Secondary | ICD-10-CM | POA: Diagnosis not present

## 2012-08-31 DIAGNOSIS — Z87891 Personal history of nicotine dependence: Secondary | ICD-10-CM | POA: Insufficient documentation

## 2012-08-31 DIAGNOSIS — I359 Nonrheumatic aortic valve disorder, unspecified: Secondary | ICD-10-CM | POA: Diagnosis not present

## 2012-08-31 DIAGNOSIS — I10 Essential (primary) hypertension: Secondary | ICD-10-CM | POA: Insufficient documentation

## 2012-08-31 DIAGNOSIS — J4489 Other specified chronic obstructive pulmonary disease: Secondary | ICD-10-CM | POA: Insufficient documentation

## 2012-08-31 NOTE — Progress Notes (Signed)
Echocardiogram performed.  

## 2013-03-12 ENCOUNTER — Other Ambulatory Visit (HOSPITAL_COMMUNITY): Payer: Self-pay | Admitting: Cardiovascular Disease

## 2013-03-12 DIAGNOSIS — I359 Nonrheumatic aortic valve disorder, unspecified: Secondary | ICD-10-CM

## 2013-03-13 ENCOUNTER — Ambulatory Visit: Payer: Medicare Other | Admitting: Cardiovascular Disease

## 2013-03-13 ENCOUNTER — Other Ambulatory Visit (HOSPITAL_COMMUNITY): Payer: Medicare Other

## 2013-03-20 ENCOUNTER — Ambulatory Visit (INDEPENDENT_AMBULATORY_CARE_PROVIDER_SITE_OTHER): Payer: Medicare Other | Admitting: Nurse Practitioner

## 2013-03-20 ENCOUNTER — Ambulatory Visit (HOSPITAL_COMMUNITY): Payer: Medicare Other | Attending: Cardiovascular Disease | Admitting: Radiology

## 2013-03-20 ENCOUNTER — Encounter: Payer: Self-pay | Admitting: Nurse Practitioner

## 2013-03-20 VITALS — BP 176/84 | HR 68 | Ht 62.0 in | Wt 125.4 lb

## 2013-03-20 DIAGNOSIS — J449 Chronic obstructive pulmonary disease, unspecified: Secondary | ICD-10-CM | POA: Insufficient documentation

## 2013-03-20 DIAGNOSIS — R079 Chest pain, unspecified: Secondary | ICD-10-CM | POA: Insufficient documentation

## 2013-03-20 DIAGNOSIS — I359 Nonrheumatic aortic valve disorder, unspecified: Secondary | ICD-10-CM | POA: Diagnosis not present

## 2013-03-20 DIAGNOSIS — I10 Essential (primary) hypertension: Secondary | ICD-10-CM | POA: Insufficient documentation

## 2013-03-20 DIAGNOSIS — Z87891 Personal history of nicotine dependence: Secondary | ICD-10-CM | POA: Diagnosis not present

## 2013-03-20 DIAGNOSIS — J4489 Other specified chronic obstructive pulmonary disease: Secondary | ICD-10-CM | POA: Insufficient documentation

## 2013-03-20 DIAGNOSIS — I350 Nonrheumatic aortic (valve) stenosis: Secondary | ICD-10-CM

## 2013-03-20 MED ORDER — HYDRALAZINE HCL 25 MG PO TABS: 25.0000 mg | ORAL_TABLET | Freq: Two times a day (BID) | ORAL | Status: DC

## 2013-03-20 NOTE — Patient Instructions (Addendum)
Your ultrasound of your heart was ok. Dr. Excell Seltzer has reviewed this today. Your pumping function is ok. Your valve is not critically blocked.   We are adding Hydralazine 25 mg to take two times a day for your blood pressure  Try to restrict your salt use  See Dr. Excell Seltzer in 3 months  Call the [facility] office at [phone] if you have any questions, problems or concerns.

## 2013-03-20 NOTE — Progress Notes (Signed)
Echocardiogram performed.  

## 2013-03-20 NOTE — Progress Notes (Signed)
Malvin Johns Date of Birth: Nov 20, 1920 Medical Record #[id number]  History of Present Illness: Desiree Hutchinson is seen back today for a 6 month check. She is seen for Dr. Excell Seltzer. She is 79 years of age. She has aortic valvular heart disease with moderate AS/AI. Other issues include HTN and shortness of breath with diastolic heart failure.  She was last seen in August. Norvasc was added. She has had issues with renal insufficiency in the past with ARB/diuretic therapy.   She comes in today. She is here with her daughter.  Doing ok. Says her breathing is no different. No worse but no better. No chest pain. Not dizzy or lightheaded. No falls. Tries to stay active. Does like salt. She has had more stress due to a daughter's death back in 12/29/23. She is having more issues with wax buildup in her ears. Has not seen her PCP in some time due to her daughter's death.   Current Outpatient Prescriptions on File Prior to Visit  Medication Sig Dispense Refill  . amLODipine (NORVASC) 5 MG tablet Take 1 tablet (5 mg total) by mouth daily.  90 tablet  3  . aspirin 81 MG tablet Take 81 mg by mouth daily.        Marland Kitchen oxymetazoline (AFRIN) 0.05 % nasal spray Place 2 sprays into the nose as needed.         No current facility-administered medications on file prior to visit.    No Known Allergies  Past Medical History  Diagnosis Date  . History of atherosclerotic cardiovascular disease   . Hypertension   . Left ventricular hypertrophy   . Gastritis   . Cough   . Personal history of tobacco use, presenting hazards to health   . Obesity   . Chest pain, non-cardiac   . Allergic rhinitis   . COPD (chronic obstructive pulmonary disease)   . History of small bowel obstruction   . Chronic diastolic heart failure   . Aortic valve disease     regurge > stenosis    Past Surgical History  Procedure Laterality Date  . Small bowel obstruction  06/1996  . Esophagogastroduodenoscopy  11/09/1999  . Adenoasine  cardiolite  11/09/2004  . Transthoracic cardiolite   11/03/2006  . Appendectomy    . Hernia repair      History  Smoking status  . Former Smoker  . Quit date: 12/27/2001  Smokeless tobacco  . Not on file    History  Alcohol Use No    History reviewed. No pertinent family history.  Review of Systems: The review of systems is per the HPI.  All other systems were reviewed and are negative.  Physical Exam: BP 176/84  Pulse 68  Ht 5\' 2"  (1.575 m)  Wt 125 lb 6.4 oz (56.881 kg)  BMI 22.93 kg/m2 Repeat BP by me is 180/60.  Patient is very pleasant and in no acute distress. Skin is warm and dry. Color is normal.  HEENT is unremarkable. Normocephalic/atraumatic. PERRL. Sclera are nonicteric. Neck is supple. No masses. No JVD. Lungs are clear. Cardiac exam shows a regular rate and rhythm. She has an outflow murmur. Abdomen is soft. Extremities are without edema. Gait and ROM are intact. No gross neurologic deficits noted.  LABORATORY DATA: Lab Results  Component Value Date   WBC 6.1 08/26/2011   HGB 12.5 08/26/2011   HCT 37.3 08/26/2011   PLT 214.0 08/26/2011   GLUCOSE 78 02/10/2012   CHOL 228* 11/20/2008   TRIG  68 11/20/2008   HDL 70.9 11/20/2008   LDLDIRECT 117.6 11/20/2008   ALT 12 08/26/2011   AST 21 08/26/2011   NA 141 02/10/2012   K 4.2 02/10/2012   CL 109 02/10/2012   CREATININE 1.3* 02/10/2012   BUN 22 02/10/2012   CO2 25 02/10/2012   TSH 1.33 10/27/2006   Assessment / Plan: 1. HTN - BP not controlled. Has not tolerated ARB/Diuretic due to renal insufficiency and dry skin. Would add Hydralazine 25 mg BID  2. Aortic valve disease - Dr. Excell Seltzer has looked at her echo - he feels she has moderate/severe AS - not critical at this time. Would continue to manage conservatively  See her back in about 3 months. Have asked her to get back in touch with her PCP for her trouble with her ears.  Patient is agreeable to this plan and will call if any problems develop in the interim.    Rosalio Macadamia, RN, ANP-C [facility] [address] [place], [place]  [address]

## 2013-03-21 ENCOUNTER — Telehealth: Payer: Self-pay | Admitting: Cardiovascular Disease

## 2013-03-21 NOTE — Telephone Encounter (Signed)
New problem    pts daughter Johnny Bridge has a question about medications

## 2013-03-21 NOTE — Telephone Encounter (Signed)
Reviewed pt medications with daughter. Pt is starting her hydralazine today.  Daughter was concerned b/c the daughter states she herself is allergic to hydralazine. Reassurance given Mylo Red RN

## 2013-03-27 ENCOUNTER — Encounter: Payer: Self-pay | Admitting: Endocrinology

## 2013-03-27 ENCOUNTER — Ambulatory Visit (INDEPENDENT_AMBULATORY_CARE_PROVIDER_SITE_OTHER): Payer: Medicare Other | Admitting: Endocrinology

## 2013-03-27 VITALS — BP 126/80 | HR 94 | Wt 125.0 lb

## 2013-03-27 DIAGNOSIS — I2510 Atherosclerotic heart disease of native coronary artery without angina pectoris: Secondary | ICD-10-CM

## 2013-03-27 DIAGNOSIS — H9393 Unspecified disorder of ear, bilateral: Secondary | ICD-10-CM

## 2013-03-27 DIAGNOSIS — I10 Essential (primary) hypertension: Secondary | ICD-10-CM | POA: Diagnosis not present

## 2013-03-27 DIAGNOSIS — I517 Cardiomegaly: Secondary | ICD-10-CM

## 2013-03-27 DIAGNOSIS — H9390 Unspecified disorder of ear, unspecified ear: Secondary | ICD-10-CM

## 2013-03-27 DIAGNOSIS — N289 Disorder of kidney and ureter, unspecified: Secondary | ICD-10-CM

## 2013-03-27 DIAGNOSIS — Z79899 Other long term (current) drug therapy: Secondary | ICD-10-CM | POA: Diagnosis not present

## 2013-03-27 LAB — URINALYSIS, ROUTINE W REFLEX MICROSCOPIC
Bilirubin Urine: NEGATIVE
Hgb urine dipstick: NEGATIVE
Ketones, ur: NEGATIVE
Nitrite: NEGATIVE
Specific Gravity, Urine: 1.025 (ref 1.000–1.030)
Total Protein, Urine: 30
Urine Glucose: NEGATIVE
Urobilinogen, UA: 0.2 (ref 0.0–1.0)
pH: 5.5 (ref 5.0–8.0)

## 2013-03-27 LAB — BASIC METABOLIC PANEL
BUN: 29 mg/dL — ABNORMAL HIGH (ref 6–23)
CO2: 21 mEq/L (ref 19–32)
Calcium: 9.6 mg/dL (ref 8.4–10.5)
Chloride: 107 mEq/L (ref 96–112)
Creatinine, Ser: 1.2 mg/dL (ref 0.4–1.2)
GFR: 52.13 mL/min — ABNORMAL LOW (ref >60.00)
Glucose, Bld: 94 mg/dL (ref 70–99)
Potassium: 3.8 mEq/L (ref 3.5–5.1)
Sodium: 138 mEq/L (ref 135–145)

## 2013-03-27 LAB — CBC WITH DIFFERENTIAL/PLATELET
Basophils Absolute: 0 10*3/uL (ref 0.0–0.1)
Basophils Relative: 0.5 % (ref 0.0–3.0)
Eosinophils Absolute: 0 10*3/uL (ref 0.0–0.7)
Eosinophils Relative: 0.8 % (ref 0.0–5.0)
HCT: 40.4 % (ref 36.0–46.0)
Hemoglobin: 13.3 g/dL (ref 12.0–15.0)
Lymphocytes Relative: 29.2 % (ref 12.0–46.0)
Lymphs Abs: 1.7 10*3/uL (ref 0.7–4.0)
MCHC: 33 g/dL (ref 30.0–36.0)
MCV: 93.5 fl (ref 78.0–100.0)
Monocytes Absolute: 0.3 10*3/uL (ref 0.1–1.0)
Monocytes Relative: 6 % (ref 3.0–12.0)
Neutro Abs: 3.6 10*3/uL (ref 1.4–7.7)
Neutrophils Relative %: 63.5 % (ref 43.0–77.0)
Platelets: 190 10*3/uL (ref 150.0–400.0)
RBC: 4.32 Mil/uL (ref 3.87–5.11)
RDW: 14.3 % (ref 11.5–14.6)
WBC: 5.7 10*3/uL (ref 4.5–10.5)

## 2013-03-27 LAB — LIPID PANEL
Cholesterol: 213 mg/dL — ABNORMAL HIGH (ref 0–200)
HDL: 94.6 mg/dL (ref >39.00)
Total CHOL/HDL Ratio: 2
Triglycerides: 80 mg/dL (ref 0.0–149.0)
VLDL: 16 mg/dL (ref 0.0–40.0)

## 2013-03-27 LAB — HEPATIC FUNCTION PANEL
ALT: 15 U/L (ref 0–35)
AST: 23 U/L (ref 0–37)
Albumin: 4.3 g/dL (ref 3.5–5.2)
Alkaline Phosphatase: 105 U/L (ref 39–117)
Bilirubin, Direct: 0.1 mg/dL (ref 0.0–0.3)
Total Bilirubin: 0.6 mg/dL (ref 0.3–1.2)
Total Protein: 7.6 g/dL (ref 6.0–8.3)

## 2013-03-27 LAB — TSH: TSH: 1.47 u[IU]/mL (ref 0.35–5.50)

## 2013-03-27 NOTE — Progress Notes (Signed)
Subjective:    Patient ID: Desiree Hutchinson, female    DOB: 09/01/1920, 79 y.o.   MRN: [id number]  HPI Pt states 2 weeks of decreased hearing from the right ear, but no assoc pain.   Past Medical History  Diagnosis Date  . History of atherosclerotic cardiovascular disease   . Hypertension   . Left ventricular hypertrophy   . Gastritis   . Cough   . Personal history of tobacco use, presenting hazards to health   . Obesity   . Chest pain, non-cardiac   . Allergic rhinitis   . COPD (chronic obstructive pulmonary disease)   . History of small bowel obstruction   . Chronic diastolic heart failure   . Aortic valve disease     regurge > stenosis    Past Surgical History  Procedure Laterality Date  . Small bowel obstruction  06/1996  . Esophagogastroduodenoscopy  11/09/1999  . Adenoasine cardiolite  11/09/2004  . Transthoracic cardiolite   11/03/2006  . Appendectomy    . Hernia repair      History   Social History  . Marital Status: Widowed    Spouse Name: N/A    Number of Children: N/A  . Years of Education: N/A   Occupational History  . Not on file.   Social History Main Topics  . Smoking status: Former Smoker    Quit date: 12/27/2001  . Smokeless tobacco: Not on file  . Alcohol Use: No  . Drug Use: Not on file  . Sexually Active: Not Currently   Other Topics Concern  . Not on file   Social History Narrative  . No narrative on file    Current Outpatient Prescriptions on File Prior to Visit  Medication Sig Dispense Refill  . amLODipine (NORVASC) 5 MG tablet Take 1 tablet (5 mg total) by mouth daily.  90 tablet  3  . aspirin 81 MG tablet Take 81 mg by mouth daily.        . hydrALAZINE (APRESOLINE) 25 MG tablet Take 1 tablet (25 mg total) by mouth 2 (two) times daily.  60 tablet  3  . oxymetazoline (AFRIN) 0.05 % nasal spray Place 2 sprays into the nose as needed.         No current facility-administered medications on file prior to visit.   No Known  Allergies  No family history on file.  BP 126/80  Pulse 94  Wt 125 lb (56.7 kg)  BMI 22.86 kg/m2  SpO2 96%  Review of Systems Denies ear drainage, but she says her left ear is painful.  She wants to see an ENT specialist    Objective:   Physical Exam VITAL SIGNS:  See vs page GENERAL: no distress Right eac: occluded with cerumen left eac and tm are normal HEAD: head: no deformity eyes: no periorbital swelling, no proptosis external nose and ears are normal mouth: no lesion seen NECK: supple, thyroid is not enlarged CHEST WALL: no deformity LUNGS:  Clear to auscultation CV: reg rate and rhythm, no murmur ABD: abdomen is soft, nontender.  no hepatosplenomegaly.  not distended.  no hernia MUSCULOSKELETAL: muscle bulk and strength are grossly normal.  no obvious joint swelling.  gait is normal and steady EXTEMITIES: no deformity.  no edema PULSES: dorsalis pedis intact bilat.  no carotid bruit NEURO:  cn 2-12 grossly intact.   readily moves all 4's.  sensation is intact to touch on all 4's SKIN:  Normal texture and temperature.  No rash or  suspicious lesion is visible.   NODES:  None palpable at the neck PSYCH: alert, oriented x3.  Does not appear anxious nor depressed.     Assessment & Plan:  eac impaction, new Renal insuff, prob age-related HTN, well-controlled Atherosclerosis.  She should have risk-factor control, as tolerated, despite her age.

## 2013-03-27 NOTE — Patient Instructions (Addendum)
Please go the the old office to get your left ear cleaned out. blood tests are being requested for you today.  We'll contact you with results. Please come in soon for a "medicare wellness" visit. Refer to an ear specialist.  you will receive a phone call, about a day and time for an appointment

## 2013-03-28 LAB — LDL CHOLESTEROL, DIRECT: Direct LDL: 104.6 mg/dL

## 2013-04-02 ENCOUNTER — Telehealth: Payer: Self-pay

## 2013-04-02 NOTE — Telephone Encounter (Signed)
Pt received phone call for ENT appt, but she was unable to hear when or where.  Please call Johnny Bridge (daughter) with info. [phone]

## 2013-04-02 NOTE — Telephone Encounter (Signed)
Pt's dtr advised of phone 3 to [facility]

## 2013-04-03 DIAGNOSIS — H903 Sensorineural hearing loss, bilateral: Secondary | ICD-10-CM | POA: Diagnosis not present

## 2013-04-03 DIAGNOSIS — H905 Unspecified sensorineural hearing loss: Secondary | ICD-10-CM | POA: Diagnosis not present

## 2013-04-03 DIAGNOSIS — H9209 Otalgia, unspecified ear: Secondary | ICD-10-CM | POA: Diagnosis not present

## 2013-04-03 DIAGNOSIS — H9190 Unspecified hearing loss, unspecified ear: Secondary | ICD-10-CM | POA: Diagnosis not present

## 2013-04-03 DIAGNOSIS — H6120 Impacted cerumen, unspecified ear: Secondary | ICD-10-CM | POA: Diagnosis not present

## 2013-06-22 ENCOUNTER — Ambulatory Visit (INDEPENDENT_AMBULATORY_CARE_PROVIDER_SITE_OTHER): Payer: Medicare Other | Admitting: Cardiovascular Disease

## 2013-06-22 ENCOUNTER — Encounter: Payer: Self-pay | Admitting: Cardiovascular Disease

## 2013-06-22 VITALS — BP 142/72 | HR 84 | Ht 62.0 in | Wt 119.8 lb

## 2013-06-22 DIAGNOSIS — I10 Essential (primary) hypertension: Secondary | ICD-10-CM

## 2013-06-22 DIAGNOSIS — I359 Nonrheumatic aortic valve disorder, unspecified: Secondary | ICD-10-CM | POA: Diagnosis not present

## 2013-06-22 MED ORDER — HYDRALAZINE HCL 25 MG PO TABS: 25.0000 mg | ORAL_TABLET | Freq: Two times a day (BID) | ORAL | Status: DC

## 2013-06-22 MED ORDER — AMLODIPINE BESYLATE 5 MG PO TABS: 5.0000 mg | ORAL_TABLET | Freq: Every day | ORAL | Status: DC

## 2013-06-22 NOTE — Patient Instructions (Addendum)
Your physician wants you to follow-up in: 6 MONTHS with Dr Cooper.  You will receive a reminder letter in the mail two months in advance. If you don't receive a letter, please call our office to schedule the follow-up appointment.  Your physician recommends that you continue on your current medications as directed. Please refer to the Current Medication list given to you today.  

## 2013-06-26 ENCOUNTER — Encounter: Payer: Self-pay | Admitting: Cardiovascular Disease

## 2013-06-26 NOTE — Progress Notes (Signed)
   HPI: 79 year old woman presenting for followup of chronic dyspnea, diastolic dysfunction, and aortic stenosis. She's had a difficult year as her daughter has passed away. She is still grieving her death. The patient's chronic dyspnea is unchanged. She denies edema or chest pain. She really has no cardiac complaints today. She reports good control of her blood pressure. She does complain of poor energy and generalized fatigue.  Outpatient Encounter Prescriptions as of 06/22/2013  Medication Sig Dispense Refill  . amLODipine (NORVASC) 5 MG tablet Take 1 tablet (5 mg total) by mouth daily.  30 tablet  11  . aspirin 81 MG tablet Take 81 mg by mouth daily.        . hydrALAZINE (APRESOLINE) 25 MG tablet Take 1 tablet (25 mg total) by mouth 2 (two) times daily.  60 tablet  11  . oxymetazoline (AFRIN) 0.05 % nasal spray Place 2 sprays into the nose as needed.        . [DISCONTINUED] amLODipine (NORVASC) 5 MG tablet Take 1 tablet (5 mg total) by mouth daily.  90 tablet  3  . [DISCONTINUED] hydrALAZINE (APRESOLINE) 25 MG tablet Take 1 tablet (25 mg total) by mouth 2 (two) times daily.  60 tablet  3   No facility-administered encounter medications on file as of 06/22/2013.    No Known Allergies  Past Medical History  Diagnosis Date  . History of atherosclerotic cardiovascular disease   . Hypertension   . Left ventricular hypertrophy   . Gastritis   . Cough   . Personal history of tobacco use, presenting hazards to health   . Obesity   . Chest pain, non-cardiac   . Allergic rhinitis   . COPD (chronic obstructive pulmonary disease)   . History of small bowel obstruction   . Chronic diastolic heart failure   . Aortic valve disease     regurge > stenosis    ROS: Negative except as per HPI  BP 142/72  Pulse 84  Ht 5\' 2"  (1.575 m)  Wt 54.341 kg (119 lb 12.8 oz)  BMI 21.91 kg/m2  PHYSICAL EXAM: Pt is alert and oriented, pleasant elderly woman in NAD HEENT: normal Neck: JVP - normal,  carotids 2+= without bruits Lungs: CTA bilaterally CV: RRR with grade 2/6 systolic murmur at the right upper sternal border Abd: soft, NT, Positive BS, no hepatomegaly Ext: no C/C/E, distal pulses intact and equal Skin: warm/dry no rash  EKG:  Sinus rhythm with occasional PVCs, heart rate 84 beats per minute, nonspecific T wave abnormality.  Echo: March 20 50,014 Study Conclusions  - Left ventricle: The cavity size was normal. Wall thickness was normal. Systolic function was normal. The estimated ejection fraction was in the range of 60% to 65%. Wall motion was normal; there were no regional wall motion abnormalities. There was an increased relative contribution of atrial contraction to ventricular filling. - Aortic valve: Moderately to severely calcified annulus. Moderately thickened, moderately calcified leaflets. There was moderate stenosis. Moderate regurgitation. Mean gradient: 29mm Hg (S). Peak gradient: 53mm Hg (S).  ASSESSMENT AND PLAN: 1. Chronic dyspnea. Suspect multifactorial (advanced age, aortic stenosis, diastolic dysfunction) 2. Essential hypertension. Blood pressure is controlled on amlodipine and hydralazine. 3. Aortic stenosis. Moderate by exam and echo.  Overall the patient is stable and I think she is getting along fairly well at her advanced age.

## 2013-08-16 ENCOUNTER — Other Ambulatory Visit: Payer: Self-pay | Admitting: Nurse Practitioner

## 2013-09-13 ENCOUNTER — Other Ambulatory Visit: Payer: Self-pay | Admitting: Nurse Practitioner

## 2013-10-26 ENCOUNTER — Telehealth: Payer: Self-pay | Admitting: Endocrinology

## 2013-10-26 NOTE — Telephone Encounter (Signed)
Called last week, needs to know when the last flu shot she had was. Please call daughter @ [phone] / Oneita Kras.

## 2013-10-26 NOTE — Telephone Encounter (Signed)
Returned call and spoke with pt's granddaughter, she stated that the pt is now living with her and she just wanted to see when she had her last flu vaccine. Advised her that not sure. It does not show when the pt had her last vaccine. She understood.

## 2013-11-19 ENCOUNTER — Other Ambulatory Visit: Payer: Self-pay | Admitting: Cardiovascular Disease

## 2013-12-18 ENCOUNTER — Ambulatory Visit (INDEPENDENT_AMBULATORY_CARE_PROVIDER_SITE_OTHER): Payer: Medicare Other | Admitting: Cardiovascular Disease

## 2013-12-18 ENCOUNTER — Telehealth: Payer: Self-pay

## 2013-12-18 ENCOUNTER — Encounter: Payer: Self-pay | Admitting: Cardiovascular Disease

## 2013-12-18 VITALS — BP 130/74 | HR 75 | Ht 61.0 in | Wt 114.0 lb

## 2013-12-18 DIAGNOSIS — Z0000 Encounter for general adult medical examination without abnormal findings: Secondary | ICD-10-CM | POA: Diagnosis not present

## 2013-12-18 DIAGNOSIS — I359 Nonrheumatic aortic valve disorder, unspecified: Secondary | ICD-10-CM | POA: Diagnosis not present

## 2013-12-18 NOTE — Telephone Encounter (Signed)
Pt grand-daughter called concerning flu shot. Grand daughter states that they were advised against getting the flu shot due to her mothers age and possible reaction. I asked if she had ever had a reaction to the flu shot before and she stated no. Please advise if ok to go ahead with flu shot.   Thanks!

## 2013-12-18 NOTE — Telephone Encounter (Signed)
Pt informed

## 2013-12-18 NOTE — Progress Notes (Signed)
    HPI:  79 year old woman presenting for followup of aortic stenosis, diastolic dysfunction, and chronic dyspnea. She had moderate aortic stenosis by echocardiogram in March 2014 with a mean gradient of 29 mm mercury.  She continues to have shortness of breath with exertion. This is a long-standing complaint. She actually feels better lying supine. She denies chest pain or pressure, lightheadedness, palpitations, or edema. Her daughter passed away last year and she now lives with her granddaughter.  Outpatient Encounter Prescriptions as of 12/18/2013  Medication Sig  . amLODipine (NORVASC) 5 MG tablet Take 1 tablet (5 mg total) by mouth daily.  Marland Kitchen aspirin 81 MG tablet Take 81 mg by mouth daily.    . hydrALAZINE (APRESOLINE) 25 MG tablet Take 1 tablet (25 mg total) by mouth 2 (two) times daily.  Marland Kitchen oxymetazoline (AFRIN) 0.05 % nasal spray Place 2 sprays into the nose as needed.      No Known Allergies  Past Medical History  Diagnosis Date  . History of atherosclerotic cardiovascular disease   . Hypertension   . Left ventricular hypertrophy   . Gastritis   . Cough   . Personal history of tobacco use, presenting hazards to health   . Obesity   . Chest pain, non-cardiac   . Allergic rhinitis   . COPD (chronic obstructive pulmonary disease)   . History of small bowel obstruction   . Chronic diastolic heart failure   . Aortic valve disease     regurge > stenosis    ROS: Negative except as per HPI  BP 130/74  Pulse 75  Ht 5\' 1"  (1.549 m)  Wt 114 lb (51.71 kg)  BMI 21.55 kg/m2  SpO2 98%  PHYSICAL EXAM: Pt is alert and oriented, pleasant elderly woman in NAD HEENT: normal Neck: JVP - normal, carotids 2+=  Lungs: CTA bilaterally CV: RRR with grade 2/6 systolic murmur at the right upper sternal border Abd: soft, NT, Positive BS, no hepatomegaly Ext: no C/C/E, distal pulses intact and equal Skin: warm/dry no rash  EKG:  Normal sinus rhythm 75 beats per minute, left  ventricular hypertrophy, T-wave abnormality consider lateral ischemia.  ASSESSMENT AND PLAN: 1. Moderate aortic stenosis. Stable by exam. Echocardiogram from this past spring was reviewed. Will repeat next year before her followup office visit. Ongoing observation is indicated at this time.  2. Chronic dyspnea. Suspect this is multifactorial and related to her advanced age, diastolic dysfunction, and possibly aortic stenosis.  3. Essential hypertension. Blood pressure is well controlled on a combination of amlodipine and hydralazine.  Tonny Bollman 12/18/2013 4:02 PM

## 2013-12-18 NOTE — Telephone Encounter (Signed)
ok 

## 2013-12-18 NOTE — Patient Instructions (Signed)
Your physician wants you to follow-up in: 1 year with Dr. Excell Seltzer.  You will receive a reminder letter in the mail two months in advance. If you don't receive a letter, please call our office to schedule the follow-up appointment.  Your physician has requested that you have an echocardiogram prior to your next visit with Dr. Excell Seltzer in 1 year. Echocardiography is a painless test that uses sound waves to create images of your heart. It provides your doctor with information about the size and shape of your heart and how well your heart's chambers and valves are working. This procedure takes approximately one hour. There are no restrictions for this procedure.  You will receive a flu vaccine in our office today.

## 2013-12-26 ENCOUNTER — Emergency Department (INDEPENDENT_AMBULATORY_CARE_PROVIDER_SITE_OTHER)
Admission: EM | Admit: 2013-12-26 | Discharge: 2013-12-26 | Disposition: A | Discharge status: Home / Self Care | Payer: Medicare Other | Source: Home / Self Care | Attending: Family Medicine | Admitting: Family Medicine

## 2013-12-26 ENCOUNTER — Encounter (HOSPITAL_COMMUNITY): Payer: Self-pay | Admitting: Emergency Medicine

## 2013-12-26 ENCOUNTER — Emergency Department (INDEPENDENT_AMBULATORY_CARE_PROVIDER_SITE_OTHER): Payer: Medicare Other

## 2013-12-26 DIAGNOSIS — J441 Chronic obstructive pulmonary disease with (acute) exacerbation: Secondary | ICD-10-CM

## 2013-12-26 DIAGNOSIS — R059 Cough, unspecified: Secondary | ICD-10-CM | POA: Diagnosis not present

## 2013-12-26 DIAGNOSIS — R05 Cough: Secondary | ICD-10-CM | POA: Diagnosis not present

## 2013-12-26 DIAGNOSIS — J111 Influenza due to unidentified influenza virus with other respiratory manifestations: Secondary | ICD-10-CM

## 2013-12-26 DIAGNOSIS — R0602 Shortness of breath: Secondary | ICD-10-CM | POA: Diagnosis not present

## 2013-12-26 MED ORDER — IPRATROPIUM BROMIDE 0.02 % IN SOLN
RESPIRATORY_TRACT | Status: AC
  Filled 2013-12-26: qty 2.5

## 2013-12-26 MED ORDER — OSELTAMIVIR PHOSPHATE 30 MG PO CAPS: 30.0000 mg | ORAL_CAPSULE | Freq: Two times a day (BID) | ORAL | Status: DC

## 2013-12-26 MED ORDER — IPRATROPIUM BROMIDE 0.02 % IN SOLN
0.5000 mg | Freq: Once | RESPIRATORY_TRACT | Status: AC
  Administered 2013-12-26: 0.5 mg via RESPIRATORY_TRACT

## 2013-12-26 MED ORDER — PREDNISONE 10 MG PO TABS: 20.0000 mg | ORAL_TABLET | Freq: Every day | ORAL | Status: DC

## 2013-12-26 MED ORDER — ALBUTEROL SULFATE (2.5 MG/3ML) 0.083% IN NEBU
INHALATION_SOLUTION | RESPIRATORY_TRACT | Status: AC
  Filled 2013-12-26: qty 6

## 2013-12-26 MED ORDER — ALBUTEROL SULFATE (5 MG/ML) 0.5% IN NEBU
5.0000 mg | INHALATION_SOLUTION | Freq: Once | RESPIRATORY_TRACT | Status: AC
  Administered 2013-12-26: 5 mg via RESPIRATORY_TRACT

## 2013-12-26 MED ORDER — ALBUTEROL SULFATE HFA 108 (90 BASE) MCG/ACT IN AERS: 2.0000 | INHALATION_SPRAY | Freq: Four times a day (QID) | RESPIRATORY_TRACT | Status: DC | PRN

## 2013-12-26 NOTE — ED Notes (Signed)
C/o cold sx States she has a fever, coughing, runny nose and sob Tylenol was given this morning Denies diarrhea, vomiting and nausea

## 2013-12-26 NOTE — ED Provider Notes (Signed)
Desiree Hutchinson is a 79 y.o. female who presents to Urgent Care today for cough congestion fevers chills shortness of breath and runny nose. The symptoms have been present for the last 2 days. Patient has a medical history significant for COPD. She's tried some over-the-counter medication for the symptoms which have been not very effective. She recently just got her flu shot several days ago. She denies any nausea vomiting or diarrhea. She is eating and drinking less than normal.   Past Medical History  Diagnosis Date  . History of atherosclerotic cardiovascular disease   . Hypertension   . Left ventricular hypertrophy   . Gastritis   . Cough   . Personal history of tobacco use, presenting hazards to health   . Obesity   . Chest pain, non-cardiac   . Allergic rhinitis   . COPD (chronic obstructive pulmonary disease)   . History of small bowel obstruction   . Chronic diastolic heart failure   . Aortic valve disease     regurge > stenosis   History  Substance Use Topics  . Smoking status: Former Smoker    Quit date: 12/27/2001  . Smokeless tobacco: Not on file  . Alcohol Use: No   ROS as above Medications reviewed. No current facility-administered medications for this encounter.   Current Outpatient Prescriptions  Medication Sig Dispense Refill  . albuterol (PROVENTIL HFA;VENTOLIN HFA) 108 (90 BASE) MCG/ACT inhaler Inhale 2 puffs into the lungs every 6 (six) hours as needed for wheezing or shortness of breath.  1 Inhaler  2  . amLODipine (NORVASC) 5 MG tablet Take 1 tablet (5 mg total) by mouth daily.  30 tablet  11  . aspirin 81 MG tablet Take 81 mg by mouth daily.        . hydrALAZINE (APRESOLINE) 25 MG tablet Take 1 tablet (25 mg total) by mouth 2 (two) times daily.  60 tablet  11  . oseltamivir (TAMIFLU) 30 MG capsule Take 1 capsule (30 mg total) by mouth 2 (two) times daily.  10 capsule  0  . oxymetazoline (AFRIN) 0.05 % nasal spray Place 2 sprays into the nose as needed.         . predniSONE (DELTASONE) 10 MG tablet Take 2 tablets (20 mg total) by mouth daily.  10 tablet  0    Exam:  BP 129/56  Pulse 109  Temp(Src) 99.4 F (37.4 C) (Oral)  Resp 18  SpO2 99% Gen: Well NAD, nontoxic appearing HEENT: EOMI,  MMM Lungs: Normal work of breathing. Rhonchorous breath sounds throughout. Heart: RRR no MRG Abd: NABS, Soft. NT, ND Exts: Non edematous BL  LE, warm and well perfused.   Patient was given a DuoNeb nebulizer treatment and had significant improvement in symptoms.   No results found for this or any previous visit (from the past 24 hour(s)). Dg Chest 2 View  12/26/2013   CLINICAL DATA:  79 year old female with cough fever shortness of breath. Initial encounter.  EXAM: CHEST  2 VIEW  COMPARISON:  06/02/2009.  FINDINGS: Lower lung volumes on the lateral view. Chronic tortuosity of the thoracic aorta. Stable cardiac size and mediastinal contours. No pneumothorax, pulmonary edema, pleural effusion or consolidation. Chronic calcified nodules projecting over the right lung are stable, that in the apex may be related to vascular atherosclerosis. No acute pulmonary opacity identified. Osteopenia. No acute osseous abnormality identified.  IMPRESSION: No acute cardiopulmonary abnormality.   Electronically Signed   By: Augusto Gamble M.D.   On:  12/26/2013 15:17    Assessment and Plan: 79 y.o. female with influenza-like illness with COPD exacerbation. Plan to treat with low-dose prednisone, and Tamiflu. Additionally we'll use albuterol. Recommend followup with primary care provider ASAP. Discussed precautions with granddaughter who expresses understanding and agreement. Discussed warning signs or symptoms. Please see discharge instructions. Patient expresses understanding.      Rodolph Bong, MD 12/26/13 [phone]

## 2014-01-08 ENCOUNTER — Encounter: Payer: Self-pay | Admitting: Endocrinology

## 2014-01-08 ENCOUNTER — Ambulatory Visit (INDEPENDENT_AMBULATORY_CARE_PROVIDER_SITE_OTHER): Payer: Medicare Other | Admitting: *Deleted

## 2014-01-08 ENCOUNTER — Ambulatory Visit (INDEPENDENT_AMBULATORY_CARE_PROVIDER_SITE_OTHER): Payer: Medicare Other | Admitting: Endocrinology

## 2014-01-08 VITALS — BP 116/64 | HR 87 | Temp 97.9°F | Ht 61.0 in | Wt 113.0 lb

## 2014-01-08 DIAGNOSIS — I10 Essential (primary) hypertension: Secondary | ICD-10-CM | POA: Diagnosis not present

## 2014-01-08 DIAGNOSIS — H9190 Unspecified hearing loss, unspecified ear: Secondary | ICD-10-CM

## 2014-01-08 DIAGNOSIS — R0602 Shortness of breath: Secondary | ICD-10-CM

## 2014-01-08 DIAGNOSIS — E669 Obesity, unspecified: Secondary | ICD-10-CM

## 2014-01-08 DIAGNOSIS — I2510 Atherosclerotic heart disease of native coronary artery without angina pectoris: Secondary | ICD-10-CM

## 2014-01-08 DIAGNOSIS — N289 Disorder of kidney and ureter, unspecified: Secondary | ICD-10-CM

## 2014-01-08 DIAGNOSIS — I517 Cardiomegaly: Secondary | ICD-10-CM

## 2014-01-08 DIAGNOSIS — Z79899 Other long term (current) drug therapy: Secondary | ICD-10-CM | POA: Diagnosis not present

## 2014-01-08 DIAGNOSIS — I359 Nonrheumatic aortic valve disorder, unspecified: Secondary | ICD-10-CM

## 2014-01-08 LAB — URINALYSIS, ROUTINE W REFLEX MICROSCOPIC
Bilirubin Urine: NEGATIVE
Hgb urine dipstick: NEGATIVE
Ketones, ur: NEGATIVE
Nitrite: POSITIVE — AB
Specific Gravity, Urine: 1.02 (ref 1.000–1.030)
Total Protein, Urine: NEGATIVE
Urine Glucose: NEGATIVE
Urobilinogen, UA: 0.2 (ref 0.0–1.0)
pH: 5.5 (ref 5.0–8.0)

## 2014-01-08 LAB — CBC WITH DIFFERENTIAL/PLATELET
Basophils Absolute: 0 10*3/uL (ref 0.0–0.1)
Basophils Relative: 0.7 % (ref 0.0–3.0)
Eosinophils Absolute: 0.1 10*3/uL (ref 0.0–0.7)
Eosinophils Relative: 1.4 % (ref 0.0–5.0)
HCT: 39.4 % (ref 36.0–46.0)
Hemoglobin: 13 g/dL (ref 12.0–15.0)
Lymphocytes Relative: 41 % (ref 12.0–46.0)
Lymphs Abs: 2.2 10*3/uL (ref 0.7–4.0)
MCHC: 33.1 g/dL (ref 30.0–36.0)
MCV: 93.7 fl (ref 78.0–100.0)
Monocytes Absolute: 0.4 10*3/uL (ref 0.1–1.0)
Monocytes Relative: 7.5 % (ref 3.0–12.0)
Neutro Abs: 2.7 10*3/uL (ref 1.4–7.7)
Neutrophils Relative %: 49.4 % (ref 43.0–77.0)
Platelets: 244 10*3/uL (ref 150.0–400.0)
RBC: 4.21 Mil/uL (ref 3.87–5.11)
RDW: 14.2 % (ref 11.5–14.6)
WBC: 5.5 10*3/uL (ref 4.5–10.5)

## 2014-01-08 LAB — LIPID PANEL
Cholesterol: 199 mg/dL (ref 0–200)
HDL: 77.1 mg/dL (ref >39.00)
LDL Cholesterol: 107 mg/dL — ABNORMAL HIGH (ref 0–99)
Total CHOL/HDL Ratio: 3
Triglycerides: 76 mg/dL (ref 0.0–149.0)
VLDL: 15.2 mg/dL (ref 0.0–40.0)

## 2014-01-08 LAB — BASIC METABOLIC PANEL
BUN: 23 mg/dL (ref 6–23)
CO2: 25 mEq/L (ref 19–32)
Calcium: 9.9 mg/dL (ref 8.4–10.5)
Chloride: 108 mEq/L (ref 96–112)
Creatinine, Ser: 1.1 mg/dL (ref 0.4–1.2)
GFR: 57.93 mL/min — ABNORMAL LOW (ref >60.00)
Glucose, Bld: 90 mg/dL (ref 70–99)
Potassium: 4.2 mEq/L (ref 3.5–5.1)
Sodium: 140 mEq/L (ref 135–145)

## 2014-01-08 LAB — HEPATIC FUNCTION PANEL
ALT: 10 U/L (ref 0–35)
AST: 18 U/L (ref 0–37)
Albumin: 4 g/dL (ref 3.5–5.2)
Alkaline Phosphatase: 70 U/L (ref 39–117)
Bilirubin, Direct: 0.1 mg/dL (ref 0.0–0.3)
Total Bilirubin: 0.7 mg/dL (ref 0.3–1.2)
Total Protein: 7.5 g/dL (ref 6.0–8.3)

## 2014-01-08 LAB — TSH: TSH: 1.1 u[IU]/mL (ref 0.35–5.50)

## 2014-01-08 LAB — BRAIN NATRIURETIC PEPTIDE: Pro B Natriuretic peptide (BNP): 20 pg/mL (ref 0.0–100.0)

## 2014-01-08 NOTE — Progress Notes (Signed)
Subjective:    Patient ID: Desiree Hutchinson, female    DOB: 10-04-1920, 80 y.o.   MRN: [id number]  HPI Pt states 2 weeks of moderate dyspnea sensation in the chest; she has associated cough, but this is improved.   Past Medical History  Diagnosis Date  . History of atherosclerotic cardiovascular disease   . Hypertension   . Left ventricular hypertrophy   . Gastritis   . Cough   . Personal history of tobacco use, presenting hazards to health   . Obesity   . Chest pain, non-cardiac   . Allergic rhinitis   . COPD (chronic obstructive pulmonary disease)   . History of small bowel obstruction   . Chronic diastolic heart failure   . Aortic valve disease     regurge > stenosis    Past Surgical History  Procedure Laterality Date  . Small bowel obstruction  06/1996  . Esophagogastroduodenoscopy  11/09/1999  . Adenoasine cardiolite  11/09/2004  . Transthoracic cardiolite   11/03/2006  . Appendectomy    . Hernia repair      History   Social History  . Marital Status: Widowed    Spouse Name: N/A    Number of Children: N/A  . Years of Education: N/A   Occupational History  . Not on file.   Social History Main Topics  . Smoking status: Former Smoker    Quit date: 12/27/2001  . Smokeless tobacco: Not on file  . Alcohol Use: No  . Drug Use: Not on file  . Sexual Activity: Not Currently   Other Topics Concern  . Not on file   Social History Narrative  . No narrative on file    Current Outpatient Prescriptions on File Prior to Visit  Medication Sig Dispense Refill  . albuterol (PROVENTIL HFA;VENTOLIN HFA) 108 (90 BASE) MCG/ACT inhaler Inhale 2 puffs into the lungs every 6 (six) hours as needed for wheezing or shortness of breath.  1 Inhaler  2  . amLODipine (NORVASC) 5 MG tablet Take 1 tablet (5 mg total) by mouth daily.  30 tablet  11  . aspirin 81 MG tablet Take 81 mg by mouth daily.        . hydrALAZINE (APRESOLINE) 25 MG tablet Take 1 tablet (25 mg total) by mouth 2  (two) times daily.  60 tablet  11  . oxymetazoline (AFRIN) 0.05 % nasal spray Place 2 sprays into the nose as needed.        Marland Kitchen oseltamivir (TAMIFLU) 30 MG capsule Take 1 capsule (30 mg total) by mouth 2 (two) times daily.  10 capsule  0   No current facility-administered medications on file prior to visit.    No Known Allergies  No family history on file.  BP 116/64  Pulse 87  Temp(Src) 97.9 F (36.6 C) (Oral)  Ht 5\' 1"  (1.549 m)  Wt 113 lb (51.256 kg)  BMI 21.36 kg/m2  SpO2 97%    Review of Systems She has hearing loss.  Pt reports mild situational depression.      Objective:   Physical Exam Vital signs: see vs page Gen: elderly, frail, no distress Both eac's and tm's are normal LUNGS:  Clear to auscultation HEART:  Regular rate and rhythm; soft systolic murmur is noted. Normal S1,S2.   Ext: no edema.   Lab Results  Component Value Date   WBC 5.5 01/08/2014   HGB 13.0 01/08/2014   HCT 39.4 01/08/2014   PLT 244.0 01/08/2014  GLUCOSE 90 01/08/2014   CHOL 199 01/08/2014   TRIG 76.0 01/08/2014   HDL 77.10 01/08/2014   LDLDIRECT 104.6 03/27/2013   LDLCALC 107* 01/08/2014   ALT 10 01/08/2014   AST 18 01/08/2014   NA 140 01/08/2014   K 4.2 01/08/2014   CL 108 01/08/2014   CREATININE 1.1 01/08/2014   BUN 23 01/08/2014   CO2 25 01/08/2014   TSH 1.10 01/08/2014  (I reviewed spirometry and ua results)    Assessment & Plan:  Sob, uncertain etiology.  Uncertain if spirometry result explains this Depression: Pt declines medication or referral.   Pyuria, new.   Subjective:   Patient here for Medicare annual wellness visit and management of other chronic and acute problems.     Risk factors: advanced age    Roster of Physicians Providing Medical Care to Patient:  See "snapshot."   Activities of Daily Living: In your present state of health, do you have any difficulty performing the following activities?:  Preparing food and eating?: No  Bathing yourself: No  Getting dressed:  No  Using the toilet:No  Moving around from place to place: No  In the past year have you fallen or had a near fall?: No    Home Safety: Has smoke detector and wears seat belts. No firearms.    Diet and Exercise  Current exercise habits: pt says limited by advanced age and doe.   Dietary issues discussed: pt reports a healthy diet   Depression Screen  Q1: Over the past two weeks, have you felt down, depressed or hopeless? Mild depression Q2: Over the past two weeks, have you felt little interest or pleasure in doing things? no   The following portions of the patient's history were reviewed and updated as appropriate: allergies, current medications, past family history, past medical history, past social history, past surgical history and problem list.   Review of Systems  She has hearing loss, but no visual loss.   Objective:   Vision:  Sees opthalmologist Hearing: grossly decreased from normal Body mass index:  See vs page Msk: pt easily and quickly performs "get-up-and-go" from a sitting position Cognitive Impairment Assessment: cognition, memory and judgment appear normal.  remembers 1/3 at 5 minutes (? Effort).  excellent recall.  can easily read and write a sentence.  alert and oriented x 3    Assessment:   Medicare wellness utd on preventive parameters    Plan:   During the course of the visit the patient was educated and counseled about appropriate screening and preventive services including:        Fall prevention   Screening mammography  Bone densitometry screening  Diabetes screening  Nutrition counseling   Vaccines / LABS Zostavax / Pneumococcal Vaccine  today   Patient Instructions (the written plan) was given to the patient.

## 2014-01-08 NOTE — Addendum Note (Signed)
Addended by: Deatra JamesBRAND, Norely Schlick M on: 01/08/2014 04:53 PM   Modules accepted: Level of Service

## 2014-01-08 NOTE — Patient Instructions (Addendum)
good diet and exercise habits significanly improve the control of your blood pressure.  please let me know if you wish to be referred to a dietician.  you should see an eye doctor every year.  You are at higher than average risk for pneumonia and hepatitis-B.  You should be vaccinated against both.   blood tests are being requested for you today.  We'll contact you with results. please consider these measures for your health:  minimize alcohol.  do not use tobacco products.  have a colonoscopy at least every 10 years from age 80.  keep firearms safely stored.  always use seat belts.  have working smoke alarms in your home.  see an eye doctor and dentist regularly.  never drive under the influence of alcohol or drugs (including prescription drugs).  those with fair skin should take precautions against the sun. Please stop at the old office to do breathing tests.   Refer to a hearing specialist.  you will receive a phone call, about a day and time for an appointment.   Please return in 1 year.

## 2014-01-08 NOTE — Progress Notes (Signed)
Pt tolerated spirometry well with good effect.Marland Kitchen.Raechel Chute/lmb

## 2014-01-09 ENCOUNTER — Telehealth: Payer: Self-pay | Admitting: *Deleted

## 2014-01-09 MED ORDER — CEFUROXIME AXETIL 250 MG PO TABS: 250.0000 mg | ORAL_TABLET | Freq: Two times a day (BID) | ORAL | Status: AC

## 2014-01-09 NOTE — Telephone Encounter (Signed)
Granddaughter, MetallurgistAmber Melton, phoned stating that Gwenith DailyMartha Melton is no longer a contact person for this patient.  States that Estevan RyderYvette Jones is patient's primary caregiver [phone]).  She also inquired about pulmonology referral.    Granddaughter's CB# [phone]

## 2014-01-09 NOTE — Telephone Encounter (Signed)
Forwarding to Dr. Everardo AllEllison & Aundra MilletMegan...Raechel Chute/lmb

## 2014-01-09 NOTE — Telephone Encounter (Signed)
Desiree RyderYvette Hutchinson new caregiver called and informed of labs.

## 2014-01-11 LAB — PTH, INTACT AND CALCIUM
Calcium: 9.8 mg/dL (ref 8.4–10.5)
PTH: 130.5 pg/mL — ABNORMAL HIGH (ref 14.0–72.0)

## 2014-01-22 ENCOUNTER — Institutional Professional Consult (permissible substitution): Payer: Medicare Other | Admitting: Emergency Medicine

## 2014-01-24 ENCOUNTER — Ambulatory Visit (INDEPENDENT_AMBULATORY_CARE_PROVIDER_SITE_OTHER): Payer: Medicare Other | Admitting: Critical Care Medicine

## 2014-01-24 ENCOUNTER — Encounter: Payer: Self-pay | Admitting: Critical Care Medicine

## 2014-01-24 VITALS — BP 132/60 | HR 92 | Temp 97.6°F | Ht 60.0 in | Wt 116.4 lb

## 2014-01-24 DIAGNOSIS — H903 Sensorineural hearing loss, bilateral: Secondary | ICD-10-CM | POA: Diagnosis not present

## 2014-01-24 DIAGNOSIS — J438 Other emphysema: Secondary | ICD-10-CM | POA: Diagnosis not present

## 2014-01-24 DIAGNOSIS — J439 Emphysema, unspecified: Secondary | ICD-10-CM

## 2014-01-24 MED ORDER — AEROCHAMBER MV MISC: Status: DC

## 2014-01-24 MED ORDER — TIOTROPIUM BROMIDE MONOHYDRATE 2.5 MCG/ACT IN AERS: 2.0000 | INHALATION_SPRAY | Freq: Every day | RESPIRATORY_TRACT | Status: DC

## 2014-01-24 NOTE — Progress Notes (Signed)
Subjective:    Patient ID: Desiree Hutchinson, female    DOB: 1920-06-26, 80 y.o.   MRN: [id number]  HPI Comments: Flu 12/26/13:  Seen at urgent.  18yrs of bronchitis now worse since Flu.   Shortness of Breath This is a chronic problem. The current episode started more than 1 year ago. The problem occurs constantly. The problem has been rapidly worsening. Associated symptoms include sputum production and wheezing. Pertinent negatives include no abdominal pain, chest pain, claudication, coryza, ear pain, fever, headaches, hemoptysis, leg pain, leg swelling, neck pain, orthopnea, PND, rash, rhinorrhea, sore throat, swollen glands, syncope or vomiting. The symptoms are aggravated by any activity, exercise, weather changes and URIs. Associated symptoms comments: Mucus is clear, no discolored mucus. Risk factors include smoking (passive smoke). She has tried beta agonist inhalers for the symptoms. The treatment provided moderate relief. Her past medical history is significant for COPD. There is no history of allergies, aspirin allergies, asthma, bronchiolitis, CAD, chronic lung disease, DVT, a heart failure, PE, pneumonia or a recent surgery.    Past Medical History  Diagnosis Date  . History of atherosclerotic cardiovascular disease   . Hypertension   . Left ventricular hypertrophy   . Gastritis   . Cough   . Personal history of tobacco use, presenting hazards to health   . Obesity   . Chest pain, non-cardiac   . Allergic rhinitis   . COPD (chronic obstructive pulmonary disease)   . History of small bowel obstruction   . Chronic diastolic heart failure   . Aortic valve disease     regurge > stenosis     No family history on file.   History   Social History  . Marital Status: Widowed    Spouse Name: N/A    Number of Children: N/A  . Years of Education: N/A   Occupational History  . Not on file.   Social History Main Topics  . Smoking status: Former Smoker -- 0.10 packs/day for 30  years    Types: Cigarettes    Quit date: 01/24/2010  . Smokeless tobacco: Never Used     Comment: smoked 1-2 cigarettes/day  . Alcohol Use: No  . Drug Use: No  . Sexual Activity: Not Currently   Other Topics Concern  . Not on file   Social History Narrative  . No narrative on file     No Known Allergies   Outpatient Prescriptions Prior to Visit  Medication Sig Dispense Refill  . albuterol (PROVENTIL HFA;VENTOLIN HFA) 108 (90 BASE) MCG/ACT inhaler Inhale 2 puffs into the lungs every 6 (six) hours as needed for wheezing or shortness of breath.  1 Inhaler  2  . aspirin 81 MG tablet Take 81 mg by mouth daily.        Marland Kitchen oxymetazoline (AFRIN) 0.05 % nasal spray Place 2 sprays into the nose as needed.        . hydrALAZINE (APRESOLINE) 25 MG tablet Take 1 tablet (25 mg total) by mouth 2 (two) times daily.  60 tablet  11  . amLODipine (NORVASC) 5 MG tablet Take 1 tablet (5 mg total) by mouth daily.  30 tablet  11  . oseltamivir (TAMIFLU) 30 MG capsule Take 1 capsule (30 mg total) by mouth 2 (two) times daily.  10 capsule  0   No facility-administered medications prior to visit.      Review of Systems  Constitutional: Negative for fever.  HENT: Negative for ear pain, rhinorrhea and sore throat.  Respiratory: Positive for sputum production, shortness of breath and wheezing. Negative for hemoptysis.   Cardiovascular: Negative for chest pain, orthopnea, claudication, leg swelling, syncope and PND.  Gastrointestinal: Negative for vomiting and abdominal pain.  Musculoskeletal: Negative for neck pain.  Skin: Negative for rash.  Neurological: Negative for headaches.       Objective:   Physical Exam Filed Vitals:   01/24/14 1154  BP: 132/60  Pulse: 92  Temp: 97.6 F (36.4 C)  TempSrc: Oral  Height: 5' (1.524 m)  Weight: 116 lb 6.4 oz (52.799 kg)  SpO2: 99%    Gen: Pleasant, well-nourished, in no distress,  normal affect  ENT: No lesions,  mouth clear,  oropharynx clear, no  postnasal drip  Neck: No JVD, no TMG, no carotid bruits  Lungs: No use of accessory muscles, no dullness to percussion, distant BS  Cardiovascular: RRR, heart sounds normal, no murmur or gallops, no peripheral edema  Abdomen: soft and NT, no HSM,  BS normal  Musculoskeletal: No deformities, no cyanosis or clubbing  Neuro: alert, non focal  Skin: Warm, no lesions or rashes  No results found. Cleda DaubSpiro: moderate airway obstruction CXR: emphysematous changes      Assessment & Plan:   COPD with emphysema Gold Stage A Gold stage A COPD with emphysematous components on imaging study Plan Start Spiriva Respimat two puff daily proair daily as needed Return 2 months    Updated Medication List Outpatient Encounter Prescriptions as of 01/24/2014  Medication Sig  . albuterol (PROVENTIL HFA;VENTOLIN HFA) 108 (90 BASE) MCG/ACT inhaler Inhale 2 puffs into the lungs every 6 (six) hours as needed for wheezing or shortness of breath.  Marland Kitchen. aspirin 81 MG tablet Take 81 mg by mouth daily.    Marland Kitchen. oxymetazoline (AFRIN) 0.05 % nasal spray Place 2 sprays into the nose as needed.    . [DISCONTINUED] hydrALAZINE (APRESOLINE) 25 MG tablet Take 1 tablet (25 mg total) by mouth 2 (two) times daily.  Marland Kitchen. amLODipine (NORVASC) 5 MG tablet Take 1 tablet (5 mg total) by mouth daily.  Marland Kitchen. Spacer/Aero-Holding Chambers (AEROCHAMBER MV) inhaler Use as instructed  . Tiotropium Bromide Monohydrate (SPIRIVA RESPIMAT) 2.5 MCG/ACT AERS Inhale 2 puffs into the lungs daily.  . [DISCONTINUED] cefUROXime (CEFTIN) 250 MG tablet   . [DISCONTINUED] oseltamivir (TAMIFLU) 30 MG capsule Take 1 capsule (30 mg total) by mouth 2 (two) times daily.

## 2014-01-24 NOTE — Patient Instructions (Signed)
Start Spiriva Respimat two puff daily proair daily Return 2 months

## 2014-01-25 NOTE — Assessment & Plan Note (Signed)
Gold stage A COPD with emphysematous components on imaging study Plan Start Spiriva Respimat two puff daily proair daily as needed Return 2 months

## 2014-03-15 ENCOUNTER — Encounter: Payer: Self-pay | Admitting: Critical Care Medicine

## 2014-03-15 ENCOUNTER — Ambulatory Visit (INDEPENDENT_AMBULATORY_CARE_PROVIDER_SITE_OTHER): Payer: Medicare Other | Admitting: Critical Care Medicine

## 2014-03-15 VITALS — BP 140/62 | HR 78 | Temp 97.0°F | Ht 61.0 in | Wt 111.4 lb

## 2014-03-15 DIAGNOSIS — I2510 Atherosclerotic heart disease of native coronary artery without angina pectoris: Secondary | ICD-10-CM

## 2014-03-15 DIAGNOSIS — J439 Emphysema, unspecified: Secondary | ICD-10-CM

## 2014-03-15 DIAGNOSIS — J438 Other emphysema: Secondary | ICD-10-CM | POA: Diagnosis not present

## 2014-03-15 NOTE — Patient Instructions (Signed)
No change in medications. Return in        6 months        

## 2014-03-15 NOTE — Progress Notes (Signed)
Subjective:    Patient ID: Desiree Hutchinson, female    DOB: [birth year], 80 y.o.   MRN: [id number]  HPI Comments: Flu 12/26/13:  Seen at urgent.  2431yrs of bronchitis now worse since Flu.   03/15/2014 Chief Complaint  Patient presents with  . 2 month follow up    Breathing has improved some.  Hoarse and chest congestion in the mornings.  Coughing very little.    On spiriva daily respimat Doing well.  Cannot afford this medication Pt denies any significant sore throat, nasal congestion or excess secretions, fever, chills, sweats, unintended weight loss, pleurtic or exertional chest pain, orthopnea PND, or leg swelling Pt denies any increase in rescue therapy over baseline, denies waking up needing it or having any early am or nocturnal exacerbations of coughing/wheezing/or dyspnea. Pt also denies any obvious fluctuation in symptoms with  weather or environmental change or other alleviating or aggravating factors   Past Medical History  Diagnosis Date  . History of atherosclerotic cardiovascular disease   . Hypertension   . Left ventricular hypertrophy   . Gastritis   . Cough   . Personal history of tobacco use, presenting hazards to health   . Obesity   . Chest pain, non-cardiac   . Allergic rhinitis   . COPD (chronic obstructive pulmonary disease)   . History of small bowel obstruction   . Chronic diastolic heart failure   . Aortic valve disease     regurge > stenosis     No family history on file.   History   Social History  . Marital Status: Widowed    Spouse Name: N/A    Number of Children: N/A  . Years of Education: N/A   Occupational History  . Not on file.   Social History Main Topics  . Smoking status: Former Smoker -- 0.10 packs/day for 30 years    Types: Cigarettes    Quit date: 01/24/2010  . Smokeless tobacco: Never Used     Comment: smoked 1-2 cigarettes/day  . Alcohol Use: No  . Drug Use: No  . Sexual Activity: Not Currently   Other Topics Concern   . Not on file   Social History Narrative  . No narrative on file     No Known Allergies   Outpatient Prescriptions Prior to Visit  Medication Sig Dispense Refill  . albuterol (PROVENTIL HFA;VENTOLIN HFA) 108 (90 BASE) MCG/ACT inhaler Inhale 2 puffs into the lungs every 6 (six) hours as needed for wheezing or shortness of breath.  1 Inhaler  2  . amLODipine (NORVASC) 5 MG tablet Take 1 tablet (5 mg total) by mouth daily.  30 tablet  11  . aspirin 81 MG tablet Take 81 mg by mouth daily.        Marland Kitchen. Spacer/Aero-Holding Chambers (AEROCHAMBER MV) inhaler Use as instructed  1 each  0  . Tiotropium Bromide Monohydrate (SPIRIVA RESPIMAT) 2.5 MCG/ACT AERS Inhale 2 puffs into the lungs daily.  4 g  6  . oxymetazoline (AFRIN) 0.05 % nasal spray Place 2 sprays into the nose as needed.         No facility-administered medications prior to visit.      Review of Systems     Objective:   Physical Exam  Filed Vitals:   03/15/14 1216  BP: 140/62  Pulse: 78  Temp: 97 F (36.1 C)  TempSrc: Oral  Height: 5\' 1"  (1.549 m)  Weight: 50.531 kg (111 lb 6.4 oz)  SpO2: 100%  Gen: Pleasant, well-nourished, in no distress,  normal affect  ENT: No lesions,  mouth clear,  oropharynx clear, no postnasal drip  Neck: No JVD, no TMG, no carotid bruits  Lungs: No use of accessory muscles, no dullness to percussion, distant BS  Cardiovascular: RRR, heart sounds normal, no murmur or gallops, no peripheral edema  Abdomen: soft and NT, no HSM,  BS normal  Musculoskeletal: No deformities, no cyanosis or clubbing  Neuro: alert, non focal  Skin: Warm, no lesions or rashes  No results found.      Assessment & Plan:   COPD with emphysema Gold Stage A Gold stage A COPD with emphysematous component improved on Spiriva Plan Continue respimat Spiriva    Updated Medication List Outpatient Encounter Prescriptions as of 03/15/2014  Medication Sig  . albuterol (PROVENTIL HFA;VENTOLIN HFA) 108 (90  BASE) MCG/ACT inhaler Inhale 2 puffs into the lungs every 6 (six) hours as needed for wheezing or shortness of breath.  Marland Kitchen amLODipine (NORVASC) 5 MG tablet Take 1 tablet (5 mg total) by mouth daily.  Marland Kitchen aspirin 81 MG tablet Take 81 mg by mouth daily.    . hydrALAZINE (APRESOLINE) 25 MG tablet Take 1 tablet by mouth daily.  Marland Kitchen Spacer/Aero-Holding Chambers (AEROCHAMBER MV) inhaler Use as instructed  . Tiotropium Bromide Monohydrate (SPIRIVA RESPIMAT) 2.5 MCG/ACT AERS Inhale 2 puffs into the lungs daily.  Marland Kitchen oxymetazoline (AFRIN) 0.05 % nasal spray Place 2 sprays into the nose as needed.

## 2014-03-16 NOTE — Assessment & Plan Note (Signed)
Gold stage A COPD with emphysematous component improved on Spiriva Plan Continue respimat Spiriva

## 2014-03-26 ENCOUNTER — Encounter: Payer: Self-pay | Admitting: Endocrinology

## 2014-04-11 ENCOUNTER — Telehealth: Payer: Self-pay | Admitting: Critical Care Medicine

## 2014-04-11 NOTE — Telephone Encounter (Signed)
lmtcb x1 on home # left Called work #. Spoke w/ Bronson IngYvette. The copay for spiriva respimate was $273. She can't afford this. Pt currently only has a rescue inhaler that she uses PRN.  Requesting alternatives. Please advise PW thanks

## 2014-04-12 NOTE — Telephone Encounter (Signed)
Find out what co pay is for standard handihaler spiriva

## 2014-04-12 NOTE — Telephone Encounter (Signed)
lmomtcb x1 for yevette

## 2014-04-16 ENCOUNTER — Telehealth: Payer: Self-pay | Admitting: Critical Care Medicine

## 2014-04-16 MED ORDER — TIOTROPIUM BROMIDE MONOHYDRATE 18 MCG IN CAPS: 18.0000 ug | ORAL_CAPSULE | Freq: Every day | RESPIRATORY_TRACT | Status: DC

## 2014-04-16 NOTE — Telephone Encounter (Signed)
Pt's granddaughter is returning triage's call & can be reached at [phone].  Bronson IngYvette states she is not sure of cost for standard handihaler spiriva. Antionette FairyHolly D Pryor

## 2014-04-16 NOTE — Telephone Encounter (Signed)
Bronson IngYvette dropped forms off for pt PA. I have placed them in PW's look at for signature. Will forward to CJ so she is aware.

## 2014-04-16 NOTE — Telephone Encounter (Signed)
Called the pharmacy and spoke with Justin---he stated that the regular spiriva would run her $268---he thinks that she is in the doughnut hole.  Could we give her a sample or two and let her daughter fill out the pt assistance forms?

## 2014-04-16 NOTE — Telephone Encounter (Signed)
Pt's granddaughter is in the lobby waiting for samples and to talk to nurse.

## 2014-04-16 NOTE — Telephone Encounter (Signed)
lmtcb for Desiree Hutchinson

## 2014-04-16 NOTE — Telephone Encounter (Signed)
I provided some samples to the pt and provided PA forms to pt daughter to complete and return. Carron CurieJennifer Bryanda Mikel, CMA

## 2014-04-16 NOTE — Telephone Encounter (Signed)
Yes this is fine with me 

## 2014-04-18 MED ORDER — TIOTROPIUM BROMIDE MONOHYDRATE 18 MCG IN CAPS: 18.0000 ug | ORAL_CAPSULE | Freq: Every day | RESPIRATORY_TRACT | Status: DC

## 2014-04-18 NOTE — Telephone Encounter (Signed)
Forms are for [employer] Pt assistance program.  Financial Information has also been left with forms. Forms are completed other than needing PW's signature. Spiriva rx was printed and placed with forms. This is in PW's folder to sign when he returns to office on Tuesday. After he signs this, I will fax to the program. lmomtcb to inform Bronson IngYvette this will be taken care of next wk.

## 2014-04-19 NOTE — Telephone Encounter (Signed)
Bronson IngYvette called back and she is aware that PW will be back in the office next week and we will get the pt assistance forms sent in .  Will forward to crystal to follow up on.

## 2014-04-19 NOTE — Telephone Encounter (Signed)
LMOMTCB x 2 -- detailed message stating that forms will be taken care of next week when Dr Delford FieldWright returns to office.

## 2014-04-23 NOTE — Telephone Encounter (Signed)
PW has signed the spiriva rx and the pt assistance application. I have faxed this to the [employer] with an office fax cover sheet. Placed application in scan folder and shredded the financial forms. lmomtcb for Bronson IngYvette to advise this has been taken care of and ask they call office back if they do not hear from the program.

## 2014-04-24 NOTE — Telephone Encounter (Signed)
Called, spoke with [place].  Advised I have faxed the completed Spiriva application and rx to the pt assistance company along with the financial information they left.  Bronson IngYvette states they already have a copy of this application and do not need a new copy.  It is in the scan folder if needed for future use.  Bronson IngYvette is to call back if they do not hear back from the pt assistance in 1-2 wks.

## 2014-05-08 ENCOUNTER — Telehealth: Payer: Self-pay | Admitting: Critical Care Medicine

## 2014-05-08 MED ORDER — TIOTROPIUM BROMIDE MONOHYDRATE 18 MCG IN CAPS
18.0000 ug | ORAL_CAPSULE | Freq: Every day | RESPIRATORY_TRACT | Status: DC
Start: 2014-05-08 — End: 2014-05-09

## 2014-05-08 NOTE — Telephone Encounter (Signed)
Spiriva patient assistance forms have been updated, the old form was sent for the spiriva patient assistance.  The company has patient the most recent form to be completed.  Spoke with Bronson IngYvette who brought form by and showed her the difference in the Patient assistance forms.  The new form still needs patients signature.  Bronson IngYvette will take this back to patient to fill out and sign and will bring these forms back with all forms needed to be sent with it.   Bronson IngYvette explained that pt would not have enough medicine to last until decision made on approval or denial. Bronson IngYvette was given two boxes of spiriva to help pt get through until new forms sent in.  They are to call if running low on medicine and still waiting on decision.  Will route to crystal to look out for patient assistance forms

## 2014-05-08 NOTE — Telephone Encounter (Signed)
Called Desiree Hutchinson. She is on her way to our office now to bring the forms that was sent to them.

## 2014-05-09 MED ORDER — TIOTROPIUM BROMIDE MONOHYDRATE 18 MCG IN CAPS: 18.0000 ug | ORAL_CAPSULE | Freq: Every day | RESPIRATORY_TRACT | Status: DC

## 2014-05-09 NOTE — Telephone Encounter (Signed)
Daughter dropped off new pt assistance forms for spiriva with pt's signature and needed information. I am off tomorrow.  Morrie Sheldonshley will have PW sign application and rx.   I will fax and take care of the forms on Monday.

## 2014-05-13 NOTE — Telephone Encounter (Signed)
Application and spriva rx have been signed by Dr. Delford FieldWright.  I have faxed this along with pt financial information to [employer] with fax cover sheet.  lmomtcb to inform Bronson IngYvette this has been taken care of and to please call office if they do not hear anything within the next 1-2 wks.

## 2014-05-14 NOTE — Telephone Encounter (Signed)
lmomtcb for [employer]. Called, spoke with pt.  She is requesting to have Desiree Hutchinson call us back regarding this.

## 2014-05-14 NOTE — Telephone Encounter (Signed)
Spoke with Bronson IngYvette and notified of recs per CJ  She verbalized understanding  Nothing further needed

## 2014-05-29 ENCOUNTER — Telehealth: Payer: Self-pay | Admitting: Critical Care Medicine

## 2014-05-29 NOTE — Telephone Encounter (Signed)
LMTCB for [employer]

## 2014-05-29 NOTE — Telephone Encounter (Signed)
Per Delana Meyer patient assistance paperwork sent to  [name] states that they attached the appropriate documentation (forms of Social Security income) Received a letter stating coverage was denied d/t missing tax information.   Spoke with Lanora Manis at [employer], pt did not submit proper documentation of proof of income.  Pt attached bank statements as proof of income--- this is not accepted.  Need to send them the reward letter stating monthly income  Per Lanora Manis, this letter can be attached to the application scanned in patient chart and faxed back to [employer] at [phone]  [facility] with [name] (cell and home#s)

## 2014-05-30 MED ORDER — TIOTROPIUM BROMIDE MONOHYDRATE 18 MCG IN CAPS: 18.0000 ug | ORAL_CAPSULE | Freq: Every day | RESPIRATORY_TRACT | Status: DC

## 2014-05-30 NOTE — Telephone Encounter (Signed)
[name] returned call, please call back at [phone]

## 2014-05-30 NOTE — Telephone Encounter (Signed)
[name] returning call. [phone]

## 2014-05-30 NOTE — Telephone Encounter (Signed)
lmomtcb x1 on [name]'s named vm

## 2014-05-30 NOTE — Telephone Encounter (Signed)
lmomtcb x1 for yvette

## 2014-05-30 NOTE — Telephone Encounter (Signed)
I called yvette and made her aware. She reports on the form it states pt can submit a SS letter stating pt income, which they did so. I called [employer] back and spoke with jerome. He reports this may have been over looked but we have to re submit everything again to get this processed. I have done so. I called [name] and made her aware of this. I have left samples for pt to pick up since she is almost out. Nothing further needed

## 2014-07-19 ENCOUNTER — Other Ambulatory Visit: Payer: Self-pay | Admitting: Cardiovascular Disease

## 2014-08-09 ENCOUNTER — Telehealth: Payer: Self-pay | Admitting: Critical Care Medicine

## 2014-08-09 MED ORDER — TIOTROPIUM BROMIDE MONOHYDRATE 18 MCG IN CAPS: 18.0000 ug | ORAL_CAPSULE | Freq: Every day | RESPIRATORY_TRACT | Status: DC

## 2014-08-09 NOTE — Telephone Encounter (Signed)
Spoke with daughter. Pt needs refill on spiriva. I have sent this in. She also reports pt can use spiriva resp and insurance will approve it.  She wants to know if Dr. Delford FieldWRight feels this is appropriate for pt once this refill runs out. Please advise thanks

## 2014-08-12 DIAGNOSIS — H353190 Nonexudative age-related macular degeneration, unspecified eye, stage unspecified: Secondary | ICD-10-CM | POA: Diagnosis not present

## 2014-08-12 DIAGNOSIS — Z961 Presence of intraocular lens: Secondary | ICD-10-CM | POA: Diagnosis not present

## 2014-08-22 NOTE — Telephone Encounter (Signed)
LMTC x 1  

## 2014-08-22 NOTE — Telephone Encounter (Signed)
i am ok with change to spiriv respimat two puff daily

## 2014-08-23 MED ORDER — TIOTROPIUM BROMIDE MONOHYDRATE 2.5 MCG/ACT IN AERS: 2.0000 | INHALATION_SPRAY | Freq: Every day | RESPIRATORY_TRACT | Status: DC

## 2014-08-23 NOTE — Telephone Encounter (Signed)
rx sent. Pt aware.Mishael Krysiak, CMA  

## 2014-09-09 ENCOUNTER — Telehealth: Payer: Self-pay | Admitting: Internal Medicine

## 2014-09-09 NOTE — Telephone Encounter (Signed)
Pts granddaughter states pt is having problems with constipation and that she has not been eating well and is losing wt. Requesting pt be seen. Pt scheduled to see Willette Cluster NP 09/11/14@3pm . Pt aware of appt.

## 2014-09-11 ENCOUNTER — Other Ambulatory Visit (INDEPENDENT_AMBULATORY_CARE_PROVIDER_SITE_OTHER): Payer: Medicare Other

## 2014-09-11 ENCOUNTER — Ambulatory Visit (INDEPENDENT_AMBULATORY_CARE_PROVIDER_SITE_OTHER): Payer: Medicare Other | Admitting: Nurse Practitioner

## 2014-09-11 ENCOUNTER — Encounter: Payer: Self-pay | Admitting: Nurse Practitioner

## 2014-09-11 VITALS — BP 132/58 | HR 76 | Ht 61.0 in | Wt 102.5 lb

## 2014-09-11 DIAGNOSIS — K5900 Constipation, unspecified: Secondary | ICD-10-CM | POA: Diagnosis not present

## 2014-09-11 DIAGNOSIS — R109 Unspecified abdominal pain: Secondary | ICD-10-CM

## 2014-09-11 DIAGNOSIS — R634 Abnormal weight loss: Secondary | ICD-10-CM

## 2014-09-11 DIAGNOSIS — I2510 Atherosclerotic heart disease of native coronary artery without angina pectoris: Secondary | ICD-10-CM

## 2014-09-11 NOTE — Progress Notes (Signed)
HPI :  Patient is a 80 year old female known to Dr. Marina Goodell though she has not been seen here in many years. Patient had a colonoscopy 2006 with findings of diverticulosis.  She comes with granddaughter for evaluation of constipation and weight loss. Patient has had iintermittent constipation for 2 years but it has gotten worse over the last month. She is now dependent on stool softeners, Senokot and suppositories to have a bowel movement. Even this regimen has become increasingly less effective. Constipation not generally associated with abdominal pain  though over the last week she has been having frequent episodes of lower abdominal pain, especially when eating. No rectal bleeding. She is losing weight, "clothes falling off". Her weight was documented at 111 pounds in March, it is 102 today. No nausea. No dysphasia. Her only new medication has been that of Spiriva   Past Medical History  Diagnosis Date  . History of atherosclerotic cardiovascular disease   . Hypertension   . Left ventricular hypertrophy   . Gastritis   . Cough   . Personal history of tobacco use, presenting hazards to health   . Obesity   . Chest pain, non-cardiac   . Allergic rhinitis   . COPD (chronic obstructive pulmonary disease)   . History of small bowel obstruction   . Chronic diastolic heart failure   . Aortic valve disease     regurge > stenosis    History reviewed. No pertinent family history. History  Substance Use Topics  . Smoking status: Former Smoker -- 0.10 packs/day for 30 years    Types: Cigarettes    Quit date: 01/24/2010  . Smokeless tobacco: Never Used     Comment: smoked 1-2 cigarettes/day  . Alcohol Use: No   Current Outpatient Prescriptions  Medication Sig Dispense Refill  . amLODipine (NORVASC) 5 MG tablet TAKE 1 TABLET (5 MG TOTAL) BY MOUTH DAILY.  30 tablet  5  . aspirin 81 MG tablet Take 81 mg by mouth daily.        . hydrALAZINE (APRESOLINE) 25 MG tablet TAKE 1 TABLET (25 MG  TOTAL) BY MOUTH 2 (TWO) TIMES DAILY.  60 tablet  5  . oxymetazoline (AFRIN) 0.05 % nasal spray Place 2 sprays into the nose as needed.        . Tiotropium Bromide Monohydrate (SPIRIVA RESPIMAT) 2.5 MCG/ACT AERS Inhale 2.5 mcg into the lungs daily at 10 pm.       No current facility-administered medications for this visit.   No Known Allergies   Review of Systems: All systems reviewed and negative except where noted in HPI.   Physical Exam: BP 132/58  Pulse 76  Ht  (1.549 m)  Wt 102 lb 8 oz (46.494 kg)  BMI 19.38 kg/m2 Constitutional: Pleasant, thin black female in no acute distress. HEENT: Normocephalic and atraumatic. Conjunctivae are normal. No scleral icterus. Neck supple.  Cardiovascular: Normal rate, regular rhythm.  Pulmonary/chest:  No wheezing. A few bibasilar crackles. Abdominal: Soft, nondistended, nontender. Bowel sounds active throughout. There are no masses palpable. No hepatomegaly. Extremities: no edema Lymphadenopathy: No cervical adenopathy noted. Neurological: Alert and oriented to person place and time. Skin: Skin is warm and dry. No rashes noted. Psychiatric: Normal mood and affect. Behavior is normal.   ASSESSMENT AND PLAN:  42. 80 year old female with chronic, intermittent constipation, worse over the last month, now with lower abdominal pain over the last week.  Etiology not clear. She is taking stool softeners, senokot and  suppositories.   Will add Miralax (twice daily at first to help evacuate bowels).  No labs since January so will get some basic labs.   Granddaughter to call in a few days with condition update. Pain hopefully secondary to constipation but if no improvement she will need further workup (possibly CTscan given weight loss).   2. Abdominal pain, possibly secondary to constipation. See #1.  3. Weight loss. Down 9 pounds since March. See #1.   4. COPD/emphysema. Followed by Dr. Delford Field

## 2014-09-11 NOTE — Patient Instructions (Signed)
Please go to the basement level to have your labs drawn.   Take 1 dose ( capful ) twice daily until moving bowels well.  Then call us in 5-7 days with a condition update.   [phone]

## 2014-09-12 ENCOUNTER — Encounter: Payer: Self-pay | Admitting: Nurse Practitioner

## 2014-09-12 LAB — CBC WITH DIFFERENTIAL/PLATELET
Basophils Absolute: 0 10*3/uL (ref 0.0–0.1)
Basophils Relative: 0.6 % (ref 0.0–3.0)
Eosinophils Absolute: 0.1 10*3/uL (ref 0.0–0.7)
Eosinophils Relative: 1.4 % (ref 0.0–5.0)
HCT: 38.3 % (ref 36.0–46.0)
Hemoglobin: 12.7 g/dL (ref 12.0–15.0)
Lymphocytes Relative: 43.9 % (ref 12.0–46.0)
Lymphs Abs: 1.9 10*3/uL (ref 0.7–4.0)
MCHC: 33.1 g/dL (ref 30.0–36.0)
MCV: 94.1 fl (ref 78.0–100.0)
Monocytes Absolute: 0.4 10*3/uL (ref 0.1–1.0)
Monocytes Relative: 8.9 % (ref 3.0–12.0)
Neutro Abs: 1.9 10*3/uL (ref 1.4–7.7)
Neutrophils Relative %: 45.2 % (ref 43.0–77.0)
Platelets: 219 10*3/uL (ref 150.0–400.0)
RBC: 4.07 Mil/uL (ref 3.87–5.11)
RDW: 15.4 % (ref 11.5–15.5)
WBC: 4.3 10*3/uL (ref 4.0–10.5)

## 2014-09-12 LAB — COMPREHENSIVE METABOLIC PANEL
ALT: 14 U/L (ref 0–35)
AST: 22 U/L (ref 0–37)
Albumin: 4.4 g/dL (ref 3.5–5.2)
Alkaline Phosphatase: 103 U/L (ref 39–117)
BUN: 19 mg/dL (ref 6–23)
CO2: 26 mEq/L (ref 19–32)
Calcium: 10 mg/dL (ref 8.4–10.5)
Chloride: 106 mEq/L (ref 96–112)
Creatinine, Ser: 1.2 mg/dL (ref 0.4–1.2)
GFR: 56.12 mL/min — ABNORMAL LOW (ref >60.00)
Glucose, Bld: 94 mg/dL (ref 70–99)
Potassium: 4.6 mEq/L (ref 3.5–5.1)
Sodium: 139 mEq/L (ref 135–145)
Total Bilirubin: 0.6 mg/dL (ref 0.2–1.2)
Total Protein: 7.8 g/dL (ref 6.0–8.3)

## 2014-09-12 NOTE — Progress Notes (Signed)
Agree with CT if no better

## 2014-10-02 ENCOUNTER — Encounter: Payer: Self-pay | Admitting: Critical Care Medicine

## 2014-10-02 ENCOUNTER — Ambulatory Visit (INDEPENDENT_AMBULATORY_CARE_PROVIDER_SITE_OTHER): Payer: Medicare Other | Admitting: Critical Care Medicine

## 2014-10-02 VITALS — BP 118/64 | HR 80 | Temp 97.0°F | Ht 61.0 in | Wt 104.2 lb

## 2014-10-02 DIAGNOSIS — Z23 Encounter for immunization: Secondary | ICD-10-CM | POA: Diagnosis not present

## 2014-10-02 DIAGNOSIS — J432 Centrilobular emphysema: Secondary | ICD-10-CM | POA: Diagnosis not present

## 2014-10-02 DIAGNOSIS — I2510 Atherosclerotic heart disease of native coronary artery without angina pectoris: Secondary | ICD-10-CM

## 2014-10-02 MED ORDER — TIOTROPIUM BROMIDE MONOHYDRATE 2.5 MCG/ACT IN AERS: 2.0000 | INHALATION_SPRAY | Freq: Every day | RESPIRATORY_TRACT | Status: DC

## 2014-10-02 NOTE — Patient Instructions (Signed)
Stay on spiriva two puff daily, we will check into patient assistance Flu vaccine No other changes Return 6 months

## 2014-10-03 NOTE — Assessment & Plan Note (Signed)
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

## 2014-10-03 NOTE — Progress Notes (Signed)
Subjective:    Patient ID: Desiree Hutchinson, female    DOB: 05-12-1920, 80 y.o.   MRN: [id number]  HPI 10/02/2014 Chief Complaint  Patient presents with  . 6 month follow up    DOE has improved.  Has cough in the mornings with clear to white mucus.  Nasal dryness.  No wheezing or chest tightness/pain  Patient notes dyspnea has improved there is some cough in the morning she is having difficulty accessing the Spiriva because of medication costs. There is no wheezing or mucus production that is significant. There is no chest tightness or chest pain.  Pt denies any significant sore throat, nasal congestion or excess secretions, fever, chills, sweats, unintended weight loss, pleurtic or exertional chest pain, orthopnea PND, or leg swelling Pt denies any increase in rescue therapy over baseline, denies waking up needing it or having any early am or nocturnal exacerbations of coughing/wheezing/or dyspnea. Pt also denies any obvious fluctuation in symptoms with  weather or environmental change or other alleviating or aggravating factors     Review of Systems Constitutional:   No  weight loss, night sweats,  Fevers, chills, fatigue, lassitude. HEENT:   No headaches,  Difficulty swallowing,  Tooth/dental problems,  Sore throat,                No sneezing, itching, ear ache, nasal congestion, post nasal drip,   CV:  No chest pain,  Orthopnea, PND, swelling in lower extremities, anasarca, dizziness, palpitations  GI  No heartburn, indigestion, abdominal pain, nausea, vomiting, diarrhea, change in bowel habits, loss of appetite  Resp: Notes shortness of breath with exertion or at rest.  No excess mucus, notes productive cough,  No non-productive cough,  No coughing up of blood.  No change in color of mucus.  No wheezing.  No chest wall deformity  Skin: no rash or lesions.  GU: no dysuria, change in color of urine, no urgency or frequency.  No flank pain.  MS:  No joint pain or swelling.  No  decreased range of motion.  No back pain.  Psych:  No change in mood or affect. No depression or anxiety.  No memory loss.     Objective:   Physical Exam Filed Vitals:   10/02/14 1151  BP: 118/64  Pulse: 80  Temp: 97 F (36.1 C)  TempSrc: Oral  Height: 5\' 1"  (1.549 m)  Weight: 104 lb 3.2 oz (47.265 kg)  SpO2: 99%    Gen: Pleasant, well-nourished, in no distress,  normal affect  ENT: No lesions,  mouth clear,  oropharynx clear, no postnasal drip  Neck: No JVD, no TMG, no carotid bruits  Lungs: No use of accessory muscles, no dullness to percussion, distant breath sounds   Cardiovascular: RRR, heart sounds normal, no murmur or gallops, no peripheral edema  Abdomen: soft and NT, no HSM,  BS normal  Musculoskeletal: No deformities, no cyanosis or clubbing  Neuro: alert, non focal  Skin: Warm, no lesions or rashes  No results found.        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 amLODipine (NORVASC) 5 MG tablet TAKE 1 TABLET (5 MG TOTAL) BY MOUTH DAILY.  Marland Kitchen aspirin 81 MG tablet Take 81 mg  by mouth daily.    . hydrALAZINE (APRESOLINE) 25 MG tablet TAKE 1 TABLET (25 MG TOTAL) BY MOUTH 2 (TWO) TIMES DAILY.  Marland Kitchen. Tiotropium Bromide Monohydrate (SPIRIVA RESPIMAT) 2.5 MCG/ACT AERS Inhale 2 puffs into the lungs daily.  . [DISCONTINUED] Tiotropium Bromide Monohydrate (SPIRIVA RESPIMAT) 2.5 MCG/ACT AERS Inhale 2.5 mcg into the lungs daily at 10 pm.  . [DISCONTINUED] oxymetazoline (AFRIN) 0.05 % nasal spray Place 2 sprays into the nose as needed.

## 2014-12-18 ENCOUNTER — Other Ambulatory Visit (HOSPITAL_COMMUNITY): Payer: Medicare Other

## 2014-12-18 ENCOUNTER — Ambulatory Visit (HOSPITAL_COMMUNITY): Payer: Medicare Other | Attending: Cardiovascular Disease

## 2014-12-18 DIAGNOSIS — I359 Nonrheumatic aortic valve disorder, unspecified: Secondary | ICD-10-CM | POA: Insufficient documentation

## 2014-12-18 DIAGNOSIS — I10 Essential (primary) hypertension: Secondary | ICD-10-CM | POA: Diagnosis not present

## 2014-12-18 DIAGNOSIS — Z87891 Personal history of nicotine dependence: Secondary | ICD-10-CM | POA: Diagnosis not present

## 2014-12-18 NOTE — Progress Notes (Signed)
2D Echo completed. 12/18/2014 

## 2014-12-26 ENCOUNTER — Ambulatory Visit (INDEPENDENT_AMBULATORY_CARE_PROVIDER_SITE_OTHER): Payer: Medicare Other | Admitting: Cardiovascular Disease

## 2014-12-26 ENCOUNTER — Encounter: Payer: Self-pay | Admitting: Cardiovascular Disease

## 2014-12-26 VITALS — BP 124/64 | HR 60 | Ht 61.0 in | Wt 99.2 lb

## 2014-12-26 DIAGNOSIS — I359 Nonrheumatic aortic valve disorder, unspecified: Secondary | ICD-10-CM

## 2014-12-26 DIAGNOSIS — I2510 Atherosclerotic heart disease of native coronary artery without angina pectoris: Secondary | ICD-10-CM | POA: Diagnosis not present

## 2014-12-26 NOTE — Progress Notes (Signed)
Background: The patient is followed for aortic stenosis, diastolic dysfunction, and chronic exertional dyspnea.  HPI:  80 year old woman presenting for follow-up evaluation. She was last seen 1 year ago. The patient is doing okay. She has continued to gradually lose weight and her weight is now less than 100 pounds. She states that her appetite is reasonably good and she has been trying to take in more calories. She denies fevers, chills, or night sweats. Her breathing is improved with use of Spiriva. She denies orthopnea, PND, leg swelling, or chest pain. She's had no lightheadedness or syncope.  Studies:   2-D echocardiogram 12/18/2014: Study Conclusions  - Left ventricle: The cavity size was normal. Wall thickness was increased in a pattern of severe LVH. Systolic function was vigorous. The estimated ejection fraction was in the range of 65% to 70%. Doppler parameters are consistent with abnormal left ventricular relaxation (grade 1 diastolic dysfunction). The E/e&' ratio is between 8-15, suggesting indeterminate LV filling pressure. - Aortic valve: Moderately calcified aortic valves. There is moderate aortic stenosis - peak and mean gradients of 54 and 28 mmHg, respectively. Based on an LVOT diameter of 2.0 cm, the calculated AVA is 1.1 cm2. There was moderate, posteriorly-directed regurgitation. Valve area (VTI): 1.08 cm^2. - Mitral valve: Moderate posterior MAC. Mildly thickened leaflets . - Left atrium: Moderately dilated at 41 ml/m2. - Tricuspid valve: There was mild regurgitation. - Pulmonary arteries: PA peak pressure: 31 mm Hg (S). - Inferior vena cava: The vessel was normal in size. The respirophasic diameter changes were in the normal range (>= 50%), consistent with normal central venous pressure.  Impressions:  - Compared to the prior echo in 2014, the aortic valve may be minimally more stenotic, but remains moderate.  Outpatient Encounter  Prescriptions as of 12/26/2014  Medication Sig  . amLODipine (NORVASC) 5 MG tablet TAKE 1 TABLET (5 MG TOTAL) BY MOUTH DAILY.  Marland Kitchen. aspirin 81 MG tablet Take 81 mg by mouth daily.    . hydrALAZINE (APRESOLINE) 25 MG tablet TAKE 1 TABLET (25 MG TOTAL) BY MOUTH 2 (TWO) TIMES DAILY.  Marland Kitchen. Tiotropium Bromide Monohydrate (SPIRIVA RESPIMAT) 2.5 MCG/ACT AERS Inhale 2 puffs into the lungs daily.    No Known Allergies  Past Medical History  Diagnosis Date  . History of atherosclerotic cardiovascular disease   . Hypertension   . Left ventricular hypertrophy   . Gastritis   . Cough   . Personal history of tobacco use, presenting hazards to health   . Obesity   . Chest pain, non-cardiac   . Allergic rhinitis   . COPD (chronic obstructive pulmonary disease)   . History of small bowel obstruction   . Chronic diastolic heart failure   . Aortic valve disease     regurge > stenosis    family history is not on file.   ROS: Negative except as per HPI  BP 124/64 mmHg  Pulse 60  Ht 5\' 1"  (1.549 m)  Wt 99 lb 3.2 oz (44.997 kg)  BMI 18.75 kg/m2  PHYSICAL EXAM: Pt is alert and oriented, thin elderly woman in NAD HEENT: normal Neck: JVP - normal, carotids 2+= with soft bilateral bruits right greater than left Lungs: CTA bilaterally CV: RRR grade 2/6 harsh systolic murmur best heard at the right upper sternal border with preserved A2 Abd: soft, NT, Positive BS, no hepatomegaly Ext: no C/C/E, distal pulses intact and equal Skin: warm/dry no rash  EKG:  Normal sinus rhythm 60 bpm, nonspecific T wave abnormality.  ASSESSMENT AND PLAN: 1. Moderate aortic stenosis. The patient's recent echocardiogram was reviewed. Her aortic stenosis is stable and remains in the moderate range. At age 80, I would recommend follow her clinically unless there is a significant change in her symptoms.  2. Chronic dyspnea. Suspect this is multifactorial in this patient with aortic stenosis, diastolic dysfunction, and  COPD. She has had some clinical improvement with Spiriva. She's followed by Dr. Delford FieldWright. Appears to be stable.  3. Essential hypertension. Blood pressure is well controlled on her current medical program.  4. Weight loss. Advised to try to increase caloric intake, consider supplements such as ensure.  For follow-up, I will see her back in one year unless problems arise.  Tonny BollmanMichael Sukhman Kocher, MD 12/26/2014 8:59 AM

## 2014-12-26 NOTE — Patient Instructions (Addendum)
Your physician wants you to follow-up in: 1 year with Dr. Cooper.  You will receive a reminder letter in the mail two months in advance. If you don't receive a letter, please call our office to schedule the follow-up appointment.  Your physician recommends that you continue on your current medications as directed. Please refer to the Current Medication list given to you today.  

## 2015-01-02 ENCOUNTER — Encounter: Payer: Self-pay | Admitting: Endocrinology

## 2015-01-10 ENCOUNTER — Ambulatory Visit (INDEPENDENT_AMBULATORY_CARE_PROVIDER_SITE_OTHER): Payer: Medicare Other | Admitting: Endocrinology

## 2015-01-10 ENCOUNTER — Encounter: Payer: Self-pay | Admitting: Endocrinology

## 2015-01-10 VITALS — BP 120/82 | HR 88 | Temp 97.7°F | Ht 61.0 in | Wt 97.0 lb

## 2015-01-10 DIAGNOSIS — K5900 Constipation, unspecified: Secondary | ICD-10-CM | POA: Diagnosis not present

## 2015-01-10 DIAGNOSIS — I10 Essential (primary) hypertension: Secondary | ICD-10-CM

## 2015-01-10 DIAGNOSIS — Z0000 Encounter for general adult medical examination without abnormal findings: Secondary | ICD-10-CM | POA: Diagnosis not present

## 2015-01-10 DIAGNOSIS — I2510 Atherosclerotic heart disease of native coronary artery without angina pectoris: Secondary | ICD-10-CM | POA: Diagnosis not present

## 2015-01-10 DIAGNOSIS — N289 Disorder of kidney and ureter, unspecified: Secondary | ICD-10-CM

## 2015-01-10 DIAGNOSIS — R413 Other amnesia: Secondary | ICD-10-CM

## 2015-01-10 DIAGNOSIS — R634 Abnormal weight loss: Secondary | ICD-10-CM

## 2015-01-10 LAB — HEPATIC FUNCTION PANEL
ALT: 11 U/L (ref 0–35)
AST: 23 U/L (ref 0–37)
Albumin: 4.4 g/dL (ref 3.5–5.2)
Alkaline Phosphatase: 97 U/L (ref 39–117)
Bilirubin, Direct: 0.1 mg/dL (ref 0.0–0.3)
Total Bilirubin: 0.5 mg/dL (ref 0.2–1.2)
Total Protein: 7.7 g/dL (ref 6.0–8.3)

## 2015-01-10 LAB — BASIC METABOLIC PANEL
BUN: 28 mg/dL — ABNORMAL HIGH (ref 6–23)
CO2: 27 mEq/L (ref 19–32)
Calcium: 10.4 mg/dL (ref 8.4–10.5)
Chloride: 107 mEq/L (ref 96–112)
Creatinine, Ser: 1.13 mg/dL (ref 0.40–1.20)
GFR: 57.8 mL/min — ABNORMAL LOW (ref >60.00)
Glucose, Bld: 87 mg/dL (ref 70–99)
Potassium: 4.4 mEq/L (ref 3.5–5.1)
Sodium: 140 mEq/L (ref 135–145)

## 2015-01-10 LAB — CBC WITH DIFFERENTIAL/PLATELET
Basophils Absolute: 0 10*3/uL (ref 0.0–0.1)
Basophils Relative: 0.5 % (ref 0.0–3.0)
Eosinophils Absolute: 0.1 10*3/uL (ref 0.0–0.7)
Eosinophils Relative: 1.9 % (ref 0.0–5.0)
HCT: 39.5 % (ref 36.0–46.0)
Hemoglobin: 13.2 g/dL (ref 12.0–15.0)
Lymphocytes Relative: 42 % (ref 12.0–46.0)
Lymphs Abs: 2.1 10*3/uL (ref 0.7–4.0)
MCHC: 33.3 g/dL (ref 30.0–36.0)
MCV: 93.6 fl (ref 78.0–100.0)
Monocytes Absolute: 0.3 10*3/uL (ref 0.1–1.0)
Monocytes Relative: 5.9 % (ref 3.0–12.0)
Neutro Abs: 2.4 10*3/uL (ref 1.4–7.7)
Neutrophils Relative %: 49.7 % (ref 43.0–77.0)
Platelets: 190 10*3/uL (ref 150.0–400.0)
RBC: 4.22 Mil/uL (ref 3.87–5.11)
RDW: 14.8 % (ref 11.5–15.5)
WBC: 4.9 10*3/uL (ref 4.0–10.5)

## 2015-01-10 LAB — URINALYSIS, ROUTINE W REFLEX MICROSCOPIC
Bilirubin Urine: NEGATIVE
Hgb urine dipstick: NEGATIVE
Ketones, ur: NEGATIVE
Leukocytes, UA: NEGATIVE
Nitrite: NEGATIVE
Specific Gravity, Urine: 1.02 (ref 1.000–1.030)
Total Protein, Urine: NEGATIVE
Urine Glucose: NEGATIVE
Urobilinogen, UA: 0.2 (ref 0.0–1.0)
pH: 6 (ref 5.0–8.0)

## 2015-01-10 LAB — LIPID PANEL
Cholesterol: 198 mg/dL (ref 0–200)
HDL: 88.6 mg/dL (ref >39.00)
LDL Cholesterol: 99 mg/dL (ref 0–99)
NonHDL: 109.4
Total CHOL/HDL Ratio: 2
Triglycerides: 51 mg/dL (ref 0.0–149.0)
VLDL: 10.2 mg/dL (ref 0.0–40.0)

## 2015-01-10 LAB — TSH: TSH: 2.21 u[IU]/mL (ref 0.35–4.50)

## 2015-01-10 LAB — VITAMIN B12: Vitamin B-12: 288 pg/mL (ref 211–911)

## 2015-01-10 NOTE — Patient Instructions (Addendum)
please consider these measures for your health:  minimize alcohol.  do not use tobacco products.  have a colonoscopy at least every 10 years from age 81.  Women should have an annual mammogram from age 79.  keep firearms safely stored.  always use seat belts.  have working smoke alarms in your home.  see an eye doctor and dentist regularly.  never drive under the influence of alcohol or drugs (including prescription drugs).  please let me know what your wishes would be, if artificial life support measures should become necessary.  it is critically important to prevent falling down (keep floor areas well-lit, dry, and free of loose objects.  If you have a cane, walker, or wheelchair, you should use it, even for short trips around the house.  Also, try not to rush) blood tests are being requested for you today.  We'll let you know about the results. Please let me know if you want a pill for the memory.   Please come back for a follow-up appointment in 2 months.

## 2015-01-10 NOTE — Progress Notes (Signed)
Subjective:    Patient ID: Desiree Hutchinson, female    DOB: 03-Jul-1920, 81 y.o.   MRN: [id number]  HPI The state of at least three ongoing medical problems is addressed today, with interval history of each noted here: Chronic constipation persists.  miralax helps, but she stopped it. Weight loss persists.  She says her appetite is "ok." denies dysphagia and dental probs.   Renal insufficiency: she denies polyuria.   Past Medical History  Diagnosis Date  . History of atherosclerotic cardiovascular disease   . Hypertension   . Left ventricular hypertrophy   . Gastritis   . Cough   . Personal history of tobacco use, presenting hazards to health   . Obesity   . Chest pain, non-cardiac   . Allergic rhinitis   . COPD (chronic obstructive pulmonary disease)   . History of small bowel obstruction   . Chronic diastolic heart failure   . Aortic valve disease     regurge > stenosis    Past Surgical History  Procedure Laterality Date  . Small bowel obstruction  06/1996  . Esophagogastroduodenoscopy  11/09/1999  . Adenoasine cardiolite  11/09/2004  . Transthoracic cardiolite   11/03/2006  . Appendectomy    . Hernia repair      History   Social History  . Marital Status: Widowed    Spouse Name: N/A    Number of Children: N/A  . Years of Education: N/A   Occupational History  . Not on file.   Social History Main Topics  . Smoking status: Former Smoker -- 0.10 packs/day for 30 years    Types: Cigarettes    Quit date: 01/24/2010  . Smokeless tobacco: Never Used     Comment: smoked 1-2 cigarettes/day  . Alcohol Use: No  . Drug Use: No  . Sexual Activity: Not Currently   Other Topics Concern  . Not on file   Social History Narrative    Current Outpatient Prescriptions on File Prior to Visit  Medication Sig Dispense Refill  . amLODipine (NORVASC) 5 MG tablet TAKE 1 TABLET (5 MG TOTAL) BY MOUTH DAILY. 30 tablet 5  . aspirin 81 MG tablet Take 81 mg by mouth daily.      .  hydrALAZINE (APRESOLINE) 25 MG tablet TAKE 1 TABLET (25 MG TOTAL) BY MOUTH 2 (TWO) TIMES DAILY. 60 tablet 5  . Tiotropium Bromide Monohydrate (SPIRIVA RESPIMAT) 2.5 MCG/ACT AERS Inhale 2 puffs into the lungs daily. 4 g 11   No current facility-administered medications on file prior to visit.    No Known Allergies  No family history on file.  BP 120/82 mmHg  Pulse 88  Temp(Src) 97.7 F (36.5 C) (Oral)  Ht  (1.549 m)  Wt 97 lb (43.999 kg)  BMI 18.34 kg/m2  SpO2 98%     Review of Systems Denies BRBPR and hematuria.    Objective:   Physical Exam VITAL SIGNS:  See vs page. GENERAL: no distress.  NECK: There is no palpable thyroid enlargement.  No thyroid nodule is palpable.  No palpable lymphadenopathy at the anterior neck. LUNGS:  Clear to auscultation. HEART:  Regular rate and rhythm without murmurs noted. Normal S1,S2.   ABDOMEN: abdomen is soft, nontender.  no hepatosplenomegaly.  not distended.  no hernia Ext: no edema Gait: slow but steady    Lab Results  Component Value Date   WBC 4.9 01/10/2015   HGB 13.2 01/10/2015   HCT 39.5 01/10/2015   PLT 190.0 01/10/2015  GLUCOSE 87 01/10/2015   CHOL 198 01/10/2015   TRIG 51.0 01/10/2015   HDL 88.60 01/10/2015   LDLDIRECT 104.6 03/27/2013   LDLCALC 99 01/10/2015   ALT 11 01/10/2015   AST 23 01/10/2015   NA 140 01/10/2015   K 4.4 01/10/2015   CL 107 01/10/2015   CREATININE 1.13 01/10/2015   BUN 28* 01/10/2015   CO2 27 01/10/2015   TSH 2.21 01/10/2015       Assessment & Plan:  Constipation: i advised pt to resume miralax Weight loss: no cause is found.  We'll follow Renal insufficiency: stable.  We'll follow   Subjective:   Patient here for Medicare annual wellness visit and management of other chronic and acute problems.     Risk factors: advanced age    Roster of Physicians Providing Medical Care to Patient:  See "snapshot"   Activities of Daily Living: In your present state of health, do you  have any difficulty performing the following activities? (lives with several family members) Preparing food and eating?: No  Bathing yourself: No  Getting dressed: No  Using the toilet:No  Moving around from place to place: No  In the past year have you fallen or had a near fall?:No    Home Safety: Has smoke detector and wears seat belts. No firearms.  Diet and Exercise  Current exercise habits: pt says good Dietary issues discussed: pt reports a healthy diet   Depression Screen  Q1: Over the past two weeks, have you felt down, depressed or hopeless? no  Q2: Over the past two weeks, have you felt little interest or pleasure in doing things? no   The following portions of the patient's history were reviewed and updated as appropriate: allergies, current medications, past family history, past medical history, past social history, past surgical history and problem list.   Review of Systems  No change in chronic hearing loss (declines hearing aids); no visual loss Objective:   Vision:  Sees opthalmologist Hearing: grossly and slightly decreased from normal Body mass index:  See vs page Msk: pt easily and quickly performs "get-up-and-go" from a sitting position.   Cognitive Impairment Assessment: cognition, memory and judgment appear normal.  remembers 0/3 at 5 minutes.  excellent recall.  can easily read and write a sentence.  alert and oriented x 3.     Assessment:   Medicare wellness utd on preventive parameters    Plan:   During the course of the visit the patient was educated and counseled about appropriate screening and preventive services including:       Fall prevention   Screening mammography is not indicated  Bone densitometry screening  Diabetes screening  Nutrition counseling   Vaccines / LABS Zostavax / Pneumococcal Vaccine  today   Patient Instructions (the written plan) was given to the patient.

## 2015-01-13 ENCOUNTER — Encounter: Payer: Medicare Other | Admitting: Endocrinology

## 2015-01-29 ENCOUNTER — Other Ambulatory Visit: Payer: Self-pay | Admitting: Cardiovascular Disease

## 2015-03-17 ENCOUNTER — Encounter: Payer: Self-pay | Admitting: Endocrinology

## 2015-03-21 ENCOUNTER — Ambulatory Visit: Payer: Medicare Other | Admitting: Endocrinology

## 2015-03-28 ENCOUNTER — Other Ambulatory Visit: Payer: Self-pay | Admitting: Cardiovascular Disease

## 2015-04-16 ENCOUNTER — Ambulatory Visit: Payer: Medicare Other | Admitting: Endocrinology

## 2015-04-18 ENCOUNTER — Ambulatory Visit (INDEPENDENT_AMBULATORY_CARE_PROVIDER_SITE_OTHER): Payer: Medicare Other | Admitting: Endocrinology

## 2015-04-18 ENCOUNTER — Encounter: Payer: Self-pay | Admitting: Endocrinology

## 2015-04-18 VITALS — BP 126/60 | HR 80 | Temp 97.5°F | Ht 61.0 in | Wt 95.0 lb

## 2015-04-18 DIAGNOSIS — I2510 Atherosclerotic heart disease of native coronary artery without angina pectoris: Secondary | ICD-10-CM

## 2015-04-18 DIAGNOSIS — N289 Disorder of kidney and ureter, unspecified: Secondary | ICD-10-CM | POA: Diagnosis not present

## 2015-04-18 DIAGNOSIS — R634 Abnormal weight loss: Secondary | ICD-10-CM | POA: Diagnosis not present

## 2015-04-18 LAB — CBC WITH DIFFERENTIAL/PLATELET
Basophils Absolute: 0 10*3/uL (ref 0.0–0.1)
Basophils Relative: 0.4 % (ref 0.0–3.0)
Eosinophils Absolute: 0 10*3/uL (ref 0.0–0.7)
Eosinophils Relative: 0.4 % (ref 0.0–5.0)
HCT: 37.2 % (ref 36.0–46.0)
Hemoglobin: 12.5 g/dL (ref 12.0–15.0)
Lymphocytes Relative: 12.6 % (ref 12.0–46.0)
Lymphs Abs: 0.9 10*3/uL (ref 0.7–4.0)
MCHC: 33.5 g/dL (ref 30.0–36.0)
MCV: 94.2 fl (ref 78.0–100.0)
Monocytes Absolute: 0.2 10*3/uL (ref 0.1–1.0)
Monocytes Relative: 3.1 % (ref 3.0–12.0)
Neutro Abs: 6 10*3/uL (ref 1.4–7.7)
Neutrophils Relative %: 83.5 % — ABNORMAL HIGH (ref 43.0–77.0)
Platelets: 182 10*3/uL (ref 150.0–400.0)
RBC: 3.95 Mil/uL (ref 3.87–5.11)
RDW: 14.9 % (ref 11.5–15.5)
WBC: 7.2 10*3/uL (ref 4.0–10.5)

## 2015-04-18 LAB — AMYLASE: Amylase: 89 U/L (ref 27–131)

## 2015-04-18 LAB — HEPATIC FUNCTION PANEL
ALT: 12 U/L (ref 0–35)
AST: 22 U/L (ref 0–37)
Albumin: 4.5 g/dL (ref 3.5–5.2)
Alkaline Phosphatase: 102 U/L (ref 39–117)
Bilirubin, Direct: 0.1 mg/dL (ref 0.0–0.3)
Total Bilirubin: 0.7 mg/dL (ref 0.2–1.2)
Total Protein: 7.6 g/dL (ref 6.0–8.3)

## 2015-04-18 LAB — BASIC METABOLIC PANEL
BUN: 30 mg/dL — ABNORMAL HIGH (ref 6–23)
CO2: 27 mEq/L (ref 19–32)
Calcium: 10.2 mg/dL (ref 8.4–10.5)
Chloride: 105 mEq/L (ref 96–112)
Creatinine, Ser: 1.17 mg/dL (ref 0.40–1.20)
GFR: 55.49 mL/min — ABNORMAL LOW (ref >60.00)
Glucose, Bld: 77 mg/dL (ref 70–99)
Potassium: 4.2 mEq/L (ref 3.5–5.1)
Sodium: 138 mEq/L (ref 135–145)

## 2015-04-18 NOTE — Progress Notes (Signed)
Subjective:    Patient ID: Desiree Hutchinson, female    DOB: 04/13/1920, 81 y.o.   MRN: [id number]  HPI Hx is from pt and dtr.  pt has lost 2 more lbs.  Pt says her appetite is good.  She has dentures, but says she cannot use them for chewing.  She has slight "tightness" of the abdomen, but no assoc pain.  Memory loss persists.  Past Medical History  Diagnosis Date  . History of atherosclerotic cardiovascular disease   . Hypertension   . Left ventricular hypertrophy   . Gastritis   . Cough   . Personal history of tobacco use, presenting hazards to health   . Obesity   . Chest pain, non-cardiac   . Allergic rhinitis   . COPD (chronic obstructive pulmonary disease)   . History of small bowel obstruction   . Chronic diastolic heart failure   . Aortic valve disease     regurge > stenosis    Past Surgical History  Procedure Laterality Date  . Small bowel obstruction  06/1996  . Esophagogastroduodenoscopy  11/09/1999  . Adenoasine cardiolite  11/09/2004  . Transthoracic cardiolite   11/03/2006  . Appendectomy    . Hernia repair      History   Social History  . Marital Status: Widowed    Spouse Name: N/A  . Number of Children: N/A  . Years of Education: N/A   Occupational History  . Not on file.   Social History Main Topics  . Smoking status: Former Smoker -- 0.10 packs/day for 30 years    Types: Cigarettes    Quit date: 01/24/2010  . Smokeless tobacco: Never Used     Comment: smoked 1-2 cigarettes/day  . Alcohol Use: No  . Drug Use: No  . Sexual Activity: Not Currently   Other Topics Concern  . Not on file   Social History Narrative    Current Outpatient Prescriptions on File Prior to Visit  Medication Sig Dispense Refill  . amLODipine (NORVASC) 5 MG tablet TAKE 1 TABLET (5 MG TOTAL) BY MOUTH DAILY. 30 tablet 5  . aspirin 81 MG tablet Take 81 mg by mouth daily.      . hydrALAZINE (APRESOLINE) 25 MG tablet TAKE 1 TABLET (25 MG TOTAL) BY MOUTH 2 (TWO) TIMES  DAILY. 60 tablet 5  . sodium chloride (OCEAN) 0.65 % SOLN nasal spray Place 1 spray into both nostrils as needed for congestion.    . Tiotropium Bromide Monohydrate (SPIRIVA RESPIMAT) 2.5 MCG/ACT AERS Inhale 2 puffs into the lungs daily. 4 g 11   No current facility-administered medications on file prior to visit.    No Known Allergies  No family history on file.  BP 126/60 mmHg  Pulse 80  Temp(Src) 97.5 F (36.4 C) (Oral)  Ht 5\' 1"  (1.549 m)  Wt 95 lb (43.092 kg)  BMI 17.96 kg/m2  SpO2 96%  Review of Systems Denies dysphagia, heartburn, and cough.  She has constipation.  She reports mild if any depression.      Objective:   Physical Exam VITAL SIGNS:  See vs page GENERAL: no distress ABDOMEN: abdomen is soft, nontender.  no hepatosplenomegaly.  not distended.  no hernia.   PSYCH: Alert and oriented x 3.  Remembers 3/3 at a few minutes.  Does not appear anxious nor depressed.      Assessment & Plan:  Weight loss, worse Memory loss, persistent: i advised aricept, but pt declines.  Patient is advised the  following: Patient Instructions  blood tests are being requested for you today.  We'll let you know about the results.  Let's check a CT scan.  you will receive a phone call, about a day and time for an appointment.  Please come back for a follow-up appointment in 2 months.

## 2015-04-18 NOTE — Patient Instructions (Addendum)
blood tests are being requested for you today.  We'll let you know about the results.  Let's check a CT scan.  you will receive a phone call, about a day and time for an appointment.  Please come back for a follow-up appointment in 2 months.

## 2015-04-23 ENCOUNTER — Encounter: Payer: Self-pay | Admitting: Critical Care Medicine

## 2015-04-23 ENCOUNTER — Ambulatory Visit (INDEPENDENT_AMBULATORY_CARE_PROVIDER_SITE_OTHER): Payer: Medicare Other | Admitting: Critical Care Medicine

## 2015-04-23 VITALS — BP 122/58 | HR 72 | Ht 61.0 in | Wt 97.0 lb

## 2015-04-23 DIAGNOSIS — J432 Centrilobular emphysema: Secondary | ICD-10-CM

## 2015-04-23 DIAGNOSIS — I2510 Atherosclerotic heart disease of native coronary artery without angina pectoris: Secondary | ICD-10-CM | POA: Diagnosis not present

## 2015-04-23 MED ORDER — TIOTROPIUM BROMIDE MONOHYDRATE 2.5 MCG/ACT IN AERS: 2.0000 | INHALATION_SPRAY | Freq: Every day | RESPIRATORY_TRACT | Status: DC

## 2015-04-23 NOTE — Patient Instructions (Signed)
No change in medications. Return in        6 months        

## 2015-04-23 NOTE — Progress Notes (Signed)
Subjective:    Patient ID: Desiree Hutchinson, female    DOB: [birth year], 81 y.o.   MRN: [id number]  HPI 04/23/2015 Chief Complaint  Patient presents with  . Follow-up    SOB w/activity; prod cough in am w/white mucus;   No change in dyspnea.  Some mucus in early AM, coughs one time and mucus comes up.  Does well rest of the day.  Pt will cough after eating.  occ swallowing issues.  Pt denies any significant sore throat, nasal congestion or excess secretions, fever, chills, sweats, unintended weight loss, pleurtic or exertional chest pain, orthopnea PND, or leg swelling Pt denies any increase in rescue therapy over baseline, denies waking up needing it or having any early am or nocturnal exacerbations of coughing/wheezing/or dyspnea. Pt also denies any obvious fluctuation in symptoms with  weather or environmental change or other alleviating or aggravating factors Wt Readings from Last 3 Encounters:  04/23/15 97 lb (43.999 kg)  04/18/15 95 lb (43.092 kg)  01/10/15 97 lb (43.999 kg)    Review of Systems Constitutional:   No  weight loss, night sweats,  Fevers, chills, fatigue, lassitude. HEENT:   No headaches,  Difficulty swallowing,  Tooth/dental problems,  Sore throat,                No sneezing, itching, ear ache, nasal congestion, post nasal drip,   CV:  No chest pain,  Orthopnea, PND, swelling in lower extremities, anasarca, dizziness, palpitations  GI  No heartburn, indigestion, abdominal pain, nausea, vomiting, diarrhea, change in bowel habits, loss of appetite  Resp: Notes shortness of breath with exertion or at rest.  No excess mucus, notes productive cough,  No non-productive cough,  No coughing up of blood.  No change in color of mucus.  No wheezing.  No chest wall deformity  Skin: no rash or lesions.  GU: no dysuria, change in color of urine, no urgency or frequency.  No flank pain.  MS:  No joint pain or swelling.  No decreased range of motion.  No back pain.  Psych:   No change in mood or affect. No depression or anxiety.  No memory loss.     Objective:   Physical Exam Filed Vitals:   04/23/15 0918  BP: 122/58  Pulse: 72  Height: 5\' 1"  (1.549 m)  Weight: 97 lb (43.999 kg)  SpO2: 100%    Gen: Pleasant, well-nourished, in no distress,  normal affect  ENT: No lesions,  mouth clear,  oropharynx clear, no postnasal drip  Neck: No JVD, no TMG, no carotid bruits  Lungs: No use of accessory muscles, no dullness to percussion, distant breath sounds   Cardiovascular: RRR, heart sounds normal, no murmur or gallops, no peripheral edema  Abdomen: soft and NT, no HSM,  BS normal  Musculoskeletal: No deformities, no cyanosis or clubbing  Neuro: alert, non focal  Skin: Warm, no lesions or rashes  No results found.        Assessment & Plan:   COPD with emphysema Gold Stage A Gold A Copd with emphysema stable at present Plan No change in inhaled or maintenance medications. Return in  6 months     Updated Medication List Outpatient Encounter Prescriptions as of 04/23/2015  Medication Sig  . amLODipine (NORVASC) 5 MG tablet TAKE 1 TABLET (5 MG TOTAL) BY MOUTH DAILY.  Marland Kitchen. aspirin 81 MG tablet Take 81 mg by mouth daily.    . hydrALAZINE (APRESOLINE) 25 MG tablet TAKE 1  TABLET (25 MG TOTAL) BY MOUTH 2 (TWO) TIMES DAILY.  . sodium chloride (OCEAN) 0.65 % SOLN nasal spray Place 1 spray into both nostrils as needed for congestion.  . Tiotropium Bromide Monohydrate (SPIRIVA RESPIMAT) 2.5 MCG/ACT AERS Inhale 2 puffs into the lungs daily.  . [DISCONTINUED] Tiotropium Bromide Monohydrate (SPIRIVA RESPIMAT) 2.5 MCG/ACT AERS Inhale 2 puffs into the lungs daily.

## 2015-04-24 ENCOUNTER — Other Ambulatory Visit: Payer: Medicare Other

## 2015-04-24 NOTE — Assessment & Plan Note (Signed)
Gold A Copd with emphysema stable at present Plan No change in inhaled or maintenance medications. Return in  6 months

## 2015-04-29 ENCOUNTER — Ambulatory Visit (INDEPENDENT_AMBULATORY_CARE_PROVIDER_SITE_OTHER)
Admission: RE | Admit: 2015-04-29 | Discharge: 2015-04-29 | Disposition: A | Discharge status: Home / Self Care | Payer: Medicare Other | Source: Ambulatory Visit | Attending: Endocrinology | Admitting: Endocrinology

## 2015-04-29 ENCOUNTER — Other Ambulatory Visit: Payer: Self-pay | Admitting: Cardiovascular Disease

## 2015-04-29 DIAGNOSIS — N289 Disorder of kidney and ureter, unspecified: Secondary | ICD-10-CM

## 2015-04-29 DIAGNOSIS — R634 Abnormal weight loss: Secondary | ICD-10-CM

## 2015-04-29 DIAGNOSIS — N2889 Other specified disorders of kidney and ureter: Secondary | ICD-10-CM | POA: Diagnosis not present

## 2015-04-29 DIAGNOSIS — I714 Abdominal aortic aneurysm, without rupture: Secondary | ICD-10-CM | POA: Diagnosis not present

## 2015-04-29 MED ORDER — IOHEXOL 300 MG/ML  SOLN
70.0000 mL | Freq: Once | INTRAMUSCULAR | Status: AC | PRN
  Administered 2015-04-29: 70 mL via INTRAVENOUS

## 2015-04-30 ENCOUNTER — Telehealth: Payer: Self-pay | Admitting: Endocrinology

## 2015-04-30 NOTE — Telephone Encounter (Signed)
Patient Desiree Hutchinson granddaughter need results sent to her. [phone] leave message if no answer.

## 2015-04-30 NOTE — Telephone Encounter (Signed)
[facility] note dated 5/./16 5:28 pm  [facility] for CT abdomen and pelvis. CB# [phone]

## 2015-04-30 NOTE — Telephone Encounter (Signed)
Received results through epic. Pt's daughter notified of results.

## 2015-05-01 NOTE — Telephone Encounter (Signed)
Number listed below is not correct. I contacted pt's granddaughter and left a voicemail on her cell phone advising of CT. Results. Requested call back if she would like to discuss.

## 2015-05-02 ENCOUNTER — Telehealth: Payer: Self-pay | Admitting: Endocrinology

## 2015-05-02 MED ORDER — LACTULOSE 20 GM/30ML PO SOLN: 30.0000 mL | Freq: Three times a day (TID) | ORAL | Status: DC

## 2015-05-02 NOTE — Telephone Encounter (Signed)
i have sent a prescription to your pharmacy 

## 2015-05-02 NOTE — Telephone Encounter (Signed)
Patients daughter called and would like to know the results of the patients test   Please advise    Thank you

## 2015-05-02 NOTE — Telephone Encounter (Signed)
Left voicemail advising pt's granddaughter rx has been sent.

## 2015-05-02 NOTE — Telephone Encounter (Signed)
Pt's granddaughter called stating the patient would like for the Lactulose to be called in to her local pharmacy. Also, at this time pt declines to have the US done of the the kidney nodule found on the CT.

## 2015-05-30 ENCOUNTER — Other Ambulatory Visit: Payer: Self-pay | Admitting: Cardiovascular Disease

## 2015-07-19 ENCOUNTER — Inpatient Hospital Stay (HOSPITAL_COMMUNITY)
Admission: EM | Admit: 2015-07-19 | Discharge: 2015-07-23 | DRG: 377 | Disposition: A | Discharge status: Home Health | Payer: Medicare Other | Attending: Internal Medicine | Admitting: Internal Medicine

## 2015-07-19 ENCOUNTER — Encounter (HOSPITAL_COMMUNITY): Payer: Self-pay | Admitting: Emergency Medicine

## 2015-07-19 DIAGNOSIS — Z87891 Personal history of nicotine dependence: Secondary | ICD-10-CM

## 2015-07-19 DIAGNOSIS — Z681 Body mass index (BMI) 19 or less, adult: Secondary | ICD-10-CM | POA: Diagnosis not present

## 2015-07-19 DIAGNOSIS — I359 Nonrheumatic aortic valve disorder, unspecified: Secondary | ICD-10-CM | POA: Diagnosis present

## 2015-07-19 DIAGNOSIS — K5900 Constipation, unspecified: Secondary | ICD-10-CM | POA: Diagnosis present

## 2015-07-19 DIAGNOSIS — I10 Essential (primary) hypertension: Secondary | ICD-10-CM | POA: Diagnosis not present

## 2015-07-19 DIAGNOSIS — I5032 Chronic diastolic (congestive) heart failure: Secondary | ICD-10-CM | POA: Diagnosis present

## 2015-07-19 DIAGNOSIS — K5731 Diverticulosis of large intestine without perforation or abscess with bleeding: Principal | ICD-10-CM | POA: Diagnosis present

## 2015-07-19 DIAGNOSIS — J439 Emphysema, unspecified: Secondary | ICD-10-CM | POA: Diagnosis present

## 2015-07-19 DIAGNOSIS — E46 Unspecified protein-calorie malnutrition: Secondary | ICD-10-CM | POA: Insufficient documentation

## 2015-07-19 DIAGNOSIS — I2510 Atherosclerotic heart disease of native coronary artery without angina pectoris: Secondary | ICD-10-CM | POA: Diagnosis present

## 2015-07-19 DIAGNOSIS — R54 Age-related physical debility: Secondary | ICD-10-CM | POA: Diagnosis present

## 2015-07-19 DIAGNOSIS — D62 Acute posthemorrhagic anemia: Secondary | ICD-10-CM | POA: Diagnosis present

## 2015-07-19 DIAGNOSIS — J449 Chronic obstructive pulmonary disease, unspecified: Secondary | ICD-10-CM | POA: Diagnosis present

## 2015-07-19 DIAGNOSIS — E43 Unspecified severe protein-calorie malnutrition: Secondary | ICD-10-CM | POA: Diagnosis present

## 2015-07-19 DIAGNOSIS — K922 Gastrointestinal hemorrhage, unspecified: Secondary | ICD-10-CM | POA: Diagnosis not present

## 2015-07-19 DIAGNOSIS — I959 Hypotension, unspecified: Secondary | ICD-10-CM | POA: Diagnosis present

## 2015-07-19 DIAGNOSIS — I350 Nonrheumatic aortic (valve) stenosis: Secondary | ICD-10-CM | POA: Diagnosis present

## 2015-07-19 DIAGNOSIS — K921 Melena: Secondary | ICD-10-CM | POA: Diagnosis not present

## 2015-07-19 DIAGNOSIS — K625 Hemorrhage of anus and rectum: Secondary | ICD-10-CM | POA: Diagnosis not present

## 2015-07-19 LAB — CBC WITH DIFFERENTIAL/PLATELET
Basophils Absolute: 0 10*3/uL (ref 0.0–0.1)
Basophils Relative: 0 % (ref 0–1)
Eosinophils Absolute: 0 10*3/uL (ref 0.0–0.7)
Eosinophils Relative: 1 % (ref 0–5)
HCT: 29.6 % — ABNORMAL LOW (ref 36.0–46.0)
Hemoglobin: 9.6 g/dL — ABNORMAL LOW (ref 12.0–15.0)
Lymphocytes Relative: 33 % (ref 12–46)
Lymphs Abs: 1.4 10*3/uL (ref 0.7–4.0)
MCH: 30.1 pg (ref 26.0–34.0)
MCHC: 32.4 g/dL (ref 30.0–36.0)
MCV: 92.8 fL (ref 78.0–100.0)
Monocytes Absolute: 0.2 10*3/uL (ref 0.1–1.0)
Monocytes Relative: 4 % (ref 3–12)
Neutro Abs: 2.6 10*3/uL (ref 1.7–7.7)
Neutrophils Relative %: 62 % (ref 43–77)
Platelets: 173 10*3/uL (ref 150–400)
RBC: 3.19 MIL/uL — ABNORMAL LOW (ref 3.87–5.11)
RDW: 14.2 % (ref 11.5–15.5)
WBC: 4.2 10*3/uL (ref 4.0–10.5)

## 2015-07-19 LAB — I-STAT CG4 LACTIC ACID, ED: Lactic Acid, Venous: 0.68 mmol/L (ref 0.5–2.0)

## 2015-07-19 LAB — BASIC METABOLIC PANEL
Anion gap: 6 (ref 5–15)
BUN: 29 mg/dL — ABNORMAL HIGH (ref 6–20)
CO2: 21 mmol/L — ABNORMAL LOW (ref 22–32)
Calcium: 9 mg/dL (ref 8.9–10.3)
Chloride: 113 mmol/L — ABNORMAL HIGH (ref 101–111)
Creatinine, Ser: 0.93 mg/dL (ref 0.44–1.00)
GFR calc Af Amer: 60 mL/min — ABNORMAL LOW (ref >60)
GFR calc non Af Amer: 51 mL/min — ABNORMAL LOW (ref >60)
Glucose, Bld: 101 mg/dL — ABNORMAL HIGH (ref 65–99)
Potassium: 4 mmol/L (ref 3.5–5.1)
Sodium: 140 mmol/L (ref 135–145)

## 2015-07-19 LAB — POC OCCULT BLOOD, ED: Fecal Occult Bld: POSITIVE — AB

## 2015-07-19 MED ORDER — TIOTROPIUM BROMIDE MONOHYDRATE 18 MCG IN CAPS
1.0000 | ORAL_CAPSULE | Freq: Every day | RESPIRATORY_TRACT | Status: DC
  Administered 2015-07-20 – 2015-07-22 (×3): 18 ug via RESPIRATORY_TRACT
  Filled 2015-07-19: qty 5

## 2015-07-19 MED ORDER — SALINE SPRAY 0.65 % NA SOLN
1.0000 | NASAL | Status: DC | PRN
  Filled 2015-07-19: qty 44

## 2015-07-19 MED ORDER — SODIUM CHLORIDE 0.9 % IV SOLN
INTRAVENOUS | Status: DC
  Administered 2015-07-20: 04:00:00 via INTRAVENOUS

## 2015-07-19 MED ORDER — SODIUM CHLORIDE 0.9 % IV BOLUS (SEPSIS)
500.0000 mL | Freq: Once | INTRAVENOUS | Status: AC
  Administered 2015-07-19: 500 mL via INTRAVENOUS

## 2015-07-19 MED ORDER — SODIUM CHLORIDE 0.9 % IJ SOLN
3.0000 mL | Freq: Two times a day (BID) | INTRAMUSCULAR | Status: DC
  Administered 2015-07-21 – 2015-07-23 (×4): 3 mL via INTRAVENOUS

## 2015-07-19 MED ORDER — SODIUM CHLORIDE 0.9 % IV BOLUS (SEPSIS): 1000.0000 mL | Freq: Once | INTRAVENOUS | Status: DC

## 2015-07-19 NOTE — H&P (Signed)
[facility] History and Physical  DOMINIKA LOSEY VHQ:[id number] DOB: 03-22-1920 DOA: 07/19/2015  Referring physician: EDP PCP: [name], MD   Chief Complaint: Rectal bleeding   HPI: Desiree Hutchinson is a 81 y.o. female who presents to the ED with rectal bleeding.  Patient reports 3 dark red bloody BMs last occuring at 5pm today.  No bleeding since 5 pm.  Has not tried anything for symptoms.  Came to ED after this occurred.  Does not have any recent history of NSAID use, anticoagulant use, surgery, h/o cancer, etc.  No h/o trauma.  Bleeding is painless, has no abdominal pain nor rectal pain.  Review of Systems: Systems reviewed.  As above, otherwise negative  Past Medical History  Diagnosis Date  . History of atherosclerotic cardiovascular disease   . Hypertension   . Left ventricular hypertrophy   . Gastritis   . Cough   . Personal history of tobacco use, presenting hazards to health   . Obesity   . Chest pain, non-cardiac   . Allergic rhinitis   . COPD (chronic obstructive pulmonary disease)   . History of small bowel obstruction   . Chronic diastolic heart failure   . Aortic valve disease     regurge > stenosis   Past Surgical History  Procedure Laterality Date  . Small bowel obstruction  06/1996  . Esophagogastroduodenoscopy  11/09/1999  . Adenoasine cardiolite  11/09/2004  . Transthoracic cardiolite   11/03/2006  . Appendectomy    . Hernia repair     Social History:  reports that she quit smoking about 5 years ago. Her smoking use included Cigarettes. She has a 3 pack-year smoking history. She has never used smokeless tobacco. She reports that she does not drink alcohol or use illicit drugs.  No Known Allergies  History reviewed. No pertinent family history.   Prior to Admission medications   Medication Sig Start Date End Date Taking? Authorizing Provider  amLODipine (NORVASC) 5 MG tablet TAKE 1 TABLET (5 MG TOTAL) BY MOUTH DAILY. 04/30/15  Yes Tonny Bollman, MD  aspirin 81 MG tablet Take 81 mg by mouth daily.     Yes Historical Provider, MD  hydrALAZINE (APRESOLINE) 25 MG tablet TAKE 1 TABLET (25 MG TOTAL) BY MOUTH 2 (TWO) TIMES DAILY. 04/30/15  Yes Tonny Bollman, MD  Lactulose 20 GM/30ML SOLN Take 30 mLs (20 g total) by mouth 3 (three) times daily. Patient taking differently: Take 30 mLs by mouth 2 (two) times daily as needed (constipation).  05/02/15  Yes [name], MD  polyethylene glycol (MIRALAX / GLYCOLAX) packet Take 17 g by mouth daily as needed for moderate constipation.   Yes Historical Provider, MD  sodium chloride (OCEAN) 0.65 % SOLN nasal spray Place 1 spray into both nostrils as needed for congestion.   Yes Historical Provider, MD  Tiotropium Bromide Monohydrate (SPIRIVA RESPIMAT) 2.5 MCG/ACT AERS Inhale 2 puffs into the lungs daily. 04/23/15  Yes Storm Frisk, MD   Physical Exam: Filed Vitals:   07/19/15 2032  BP: 94/49  Pulse: 68  Temp:   Resp: 18    BP 94/49 mmHg  Pulse 68  Temp(Src) 97.9 F (36.6 C) (Oral)  Resp 18  SpO2 100%  General Appearance:    Alert, oriented, no distress, appears stated age  Head:    Normocephalic, atraumatic  Eyes:    PERRL, EOMI, sclera non-icteric        Nose:   Nares without drainage or epistaxis. Mucosa, turbinates  normal  Throat:   Moist mucous membranes. Oropharynx without erythema or exudate.  Neck:   Supple. No carotid bruits.  No thyromegaly.  No lymphadenopathy.   Back:     No CVA tenderness, no spinal tenderness  Lungs:     Clear to auscultation bilaterally, without wheezes, rhonchi or rales  Chest wall:    No tenderness to palpitation  Heart:    Regular rate and rhythm without murmurs, gallops, rubs  Abdomen:     Soft, non-tender, nondistended, normal bowel sounds, no organomegaly  Genitalia:    deferred  Rectal:    deferred  Extremities:   No clubbing, cyanosis or edema.  Pulses:   2+ and symmetric all extremities  Skin:   Skin color, texture, turgor normal, no  rashes or lesions  Lymph nodes:   Cervical, supraclavicular, and axillary nodes normal  Neurologic:   CNII-XII intact. Normal strength, sensation and reflexes      throughout    Labs on Admission:  Basic Metabolic Panel:  Recent Labs Lab 07/19/15 1911  NA 140  K 4.0  CL 113*  CO2 21*  GLUCOSE 101*  BUN 29*  CREATININE 0.93  CALCIUM 9.0   Liver Function Tests: No results for input(s): AST, ALT, ALKPHOS, BILITOT, PROT, ALBUMIN in the last 168 hours. No results for input(s): LIPASE, AMYLASE in the last 168 hours. No results for input(s): AMMONIA in the last 168 hours. CBC:  Recent Labs Lab 07/19/15 1911  WBC 4.2  NEUTROABS 2.6  HGB 9.6*  HCT 29.6*  MCV 92.8  PLT 173   Cardiac Enzymes: No results for input(s): CKTOTAL, CKMB, CKMBINDEX, TROPONINI in the last 168 hours.  BNP (last 3 results) No results for input(s): PROBNP in the last 8760 hours. CBG: No results for input(s): GLUCAP in the last 168 hours.  Radiological Exams on Admission: No results found.  EKG: Independently reviewed.  Assessment/Plan Active Problems:   Essential hypertension   Lower GI bleed   Hematochezia   Acute blood loss anemia   1. LGIB - 1. Seems to have slowed / stopped, no further bleeding since 5pm 2. Call GI in AM 3. IVF 4. Clear liquid diet 2. Acute blood loss anemia - 1. Repeat CBC in AM 2. Transfuse if needed 3. H/o HTN - hold antihypertensives as BPs have been borderline low in ED. 4. COPD - continue home nebs    Code Status: Full  Family Communication: Family at bedside Disposition Plan: Admit to inpatient   Time spent: 70 min  GARDNER, JARED M. [facility] Pager [phone]  If 7AM-7PM, please contact the day team taking care of the patient [email or web address] Password [facility] 07/19/2015, 9:49 PM

## 2015-07-19 NOTE — ED Provider Notes (Signed)
CSN: [id number]     Arrival date & time 07/19/15  1837 History   First MD Initiated Contact with Patient 07/19/15 1852     Chief Complaint  Patient presents with  . Rectal Bleeding     (Consider location/radiation/quality/duration/timing/severity/associated sxs/prior Treatment) Patient is a 81 y.o. female presenting with hematochezia. The history is provided by the patient. No language interpreter was used.  Rectal Bleeding Quality:  Maroon Amount:  Moderate Duration:  2 hours Timing:  Intermittent Progression:  Waxing and waning Chronicity:  New Context: defecation and diarrhea   Context: not anal penetration, not constipation, not foreign body, not hemorrhoids, not rectal injury, not rectal pain and not spontaneously   Similar prior episodes: no   Relieved by:  Nothing Worsened by:  Nothing tried Ineffective treatments:  None tried Associated symptoms: no abdominal pain, no dizziness, no epistaxis, no fever, no hematemesis, no light-headedness, no loss of consciousness, no recent illness and no vomiting   Risk factors: no anticoagulant use, no hx of colorectal cancer, no hx of colorectal surgery, no liver disease, no NSAID use and no steroid use     Past Medical History  Diagnosis Date  . History of atherosclerotic cardiovascular disease   . Hypertension   . Left ventricular hypertrophy   . Gastritis   . Cough   . Personal history of tobacco use, presenting hazards to health   . Obesity   . Chest pain, non-cardiac   . Allergic rhinitis   . COPD (chronic obstructive pulmonary disease)   . History of small bowel obstruction   . Chronic diastolic heart failure   . Aortic valve disease     regurge > stenosis   Past Surgical History  Procedure Laterality Date  . Small bowel obstruction  06/1996  . Esophagogastroduodenoscopy  11/09/1999  . Adenoasine cardiolite  11/09/2004  . Transthoracic cardiolite   11/03/2006  . Appendectomy    . Hernia repair     History reviewed.  No pertinent family history. History  Substance Use Topics  . Smoking status: Former Smoker -- 0.10 packs/day for 30 years    Types: Cigarettes    Quit date: 01/24/2010  . Smokeless tobacco: Never Used     Comment: smoked 1-2 cigarettes/day  . Alcohol Use: No   OB History    No data available     Review of Systems  Constitutional: Negative for fever, chills, diaphoresis, activity change, appetite change and fatigue.  HENT: Negative for congestion, facial swelling, nosebleeds, rhinorrhea and sore throat.   Eyes: Negative for photophobia and discharge.  Respiratory: Negative for cough, chest tightness and shortness of breath.   Cardiovascular: Negative for chest pain, palpitations and leg swelling.  Gastrointestinal: Positive for hematochezia and anal bleeding. Negative for nausea, vomiting, abdominal pain, diarrhea and hematemesis.  Endocrine: Negative for polydipsia and polyuria.  Genitourinary: Negative for dysuria, frequency, difficulty urinating and pelvic pain.  Musculoskeletal: Negative for back pain, arthralgias, neck pain and neck stiffness.  Skin: Negative for color change and wound.  Allergic/Immunologic: Negative for immunocompromised state.  Neurological: Negative for dizziness, loss of consciousness, facial asymmetry, weakness, light-headedness, numbness and headaches.  Hematological: Does not bruise/bleed easily.  Psychiatric/Behavioral: Negative for confusion and agitation.      Allergies  Review of patient's allergies indicates no known allergies.  Home Medications   Prior to Admission medications   Medication Sig Start Date End Date Taking? Authorizing Provider  amLODipine (NORVASC) 5 MG tablet TAKE 1 TABLET (5 MG TOTAL) BY MOUTH  DAILY. 04/30/15  Yes Tonny Bollman, MD  aspirin 81 MG tablet Take 81 mg by mouth daily.     Yes Historical Provider, MD  hydrALAZINE (APRESOLINE) 25 MG tablet TAKE 1 TABLET (25 MG TOTAL) BY MOUTH 2 (TWO) TIMES DAILY. 04/30/15  Yes  Tonny Bollman, MD  Lactulose 20 GM/30ML SOLN Take 30 mLs (20 g total) by mouth 3 (three) times daily. Patient taking differently: Take 30 mLs by mouth 2 (two) times daily as needed (constipation).  05/02/15  Yes [name], MD  polyethylene glycol (MIRALAX / GLYCOLAX) packet Take 17 g by mouth daily as needed for moderate constipation.   Yes Historical Provider, MD  sodium chloride (OCEAN) 0.65 % SOLN nasal spray Place 1 spray into both nostrils as needed for congestion.   Yes Historical Provider, MD  Tiotropium Bromide Monohydrate (SPIRIVA RESPIMAT) 2.5 MCG/ACT AERS Inhale 2 puffs into the lungs daily. 04/23/15  Yes Storm Frisk, MD   BP 94/49 mmHg  Pulse 68  Temp(Src) 97.9 F (36.6 C) (Oral)  Resp 18  SpO2 100% Physical Exam  Constitutional: She is oriented to person, place, and time. She appears well-developed and well-nourished. No distress.  HENT:  Head: Normocephalic and atraumatic.  Mouth/Throat: No oropharyngeal exudate.  Eyes: Pupils are equal, round, and reactive to light.  Neck: Normal range of motion. Neck supple.  Cardiovascular: Normal rate, regular rhythm and normal heart sounds.  Exam reveals no gallop and no friction rub.   No murmur heard. Pulmonary/Chest: Effort normal and breath sounds normal. No respiratory distress. She has no wheezes. She has no rales.  Abdominal: Soft. Bowel sounds are normal. She exhibits no distension and no mass. There is no tenderness. There is no rebound and no guarding.  Genitourinary: Guaiac positive stool.  Nonbleeding external hemorrhoid, grossly bloody stool on rectal exam  Musculoskeletal: Normal range of motion. She exhibits no edema or tenderness.  Neurological: She is alert and oriented to person, place, and time.  Skin: Skin is warm and dry.  Psychiatric: She has a normal mood and affect.    ED Course  Procedures (including critical care time) Labs Review Labs Reviewed  CBC WITH DIFFERENTIAL/PLATELET - Abnormal; Notable  for the following:    RBC 3.19 (*)    Hemoglobin 9.6 (*)    HCT 29.6 (*)    All other components within normal limits  BASIC METABOLIC PANEL - Abnormal; Notable for the following:    Chloride 113 (*)    CO2 21 (*)    Glucose, Bld 101 (*)    BUN 29 (*)    GFR calc non Af Amer 51 (*)    GFR calc Af Amer 60 (*)    All other components within normal limits  POC OCCULT BLOOD, ED - Abnormal; Notable for the following:    Fecal Occult Bld POSITIVE (*)    All other components within normal limits  I-STAT CG4 LACTIC ACID, ED  TYPE AND SCREEN  ABO/RH    Imaging Review No results found.   EKG Interpretation None      MDM   Final diagnoses:  Lower GI bleed  Acute blood loss anemia    Pt is a 81 y.o. female with Pmhx as above who presents with 3 episodes of bright red blood per rectum today around 5 PM she states she had dark red blood with clots mixed with stool.  She denies rectal pain or abdominal pain.  She has not had lightheadedness, chest pain, shortness of  breath or fevers.  On physical exam blood pressure is mildly low.  Vital signs are stable.  She is in no acute distress.  She has a small nonbleeding hemorrhoid on rectal exam with gross dark red blood.   Hemoglobin is 9.6, which is down from 12.5 3 months ago.  She continues to have no acute complaints other than dry mouth.  She's had some borderline blood pressures of most recent is 111/45 with a heart rate is 70.  We'll speak to [facility] for admission.  She has had no further rectal bleeding since around 5 PM this evening.      Toy Cookey, MD 07/19/15 2125

## 2015-07-19 NOTE — ED Notes (Signed)
Pt was seen at MD office today and thought she needed to have a BM. When she went to use the bathroom she had diarrhea and "a lot of blood filling the toilet with clots." Denies N/V/F. Denies fatigue. A&Ox4. RR even/unlabored. Not on blood thinners.

## 2015-07-20 ENCOUNTER — Inpatient Hospital Stay (HOSPITAL_COMMUNITY): Payer: Medicare Other

## 2015-07-20 DIAGNOSIS — I10 Essential (primary) hypertension: Secondary | ICD-10-CM

## 2015-07-20 DIAGNOSIS — D62 Acute posthemorrhagic anemia: Secondary | ICD-10-CM

## 2015-07-20 DIAGNOSIS — K921 Melena: Secondary | ICD-10-CM

## 2015-07-20 LAB — CBC
HCT: 20.6 % — ABNORMAL LOW (ref 36.0–46.0)
Hemoglobin: 6.9 g/dL — CL (ref 12.0–15.0)
MCH: 31.2 pg (ref 26.0–34.0)
MCHC: 33.5 g/dL (ref 30.0–36.0)
MCV: 93.2 fL (ref 78.0–100.0)
Platelets: 126 10*3/uL — ABNORMAL LOW (ref 150–400)
RBC: 2.21 MIL/uL — ABNORMAL LOW (ref 3.87–5.11)
RDW: 14.3 % (ref 11.5–15.5)
WBC: 6.5 10*3/uL (ref 4.0–10.5)

## 2015-07-20 LAB — BASIC METABOLIC PANEL
Anion gap: 4 — ABNORMAL LOW (ref 5–15)
BUN: 30 mg/dL — ABNORMAL HIGH (ref 6–20)
CO2: 20 mmol/L — ABNORMAL LOW (ref 22–32)
Calcium: 8.5 mg/dL — ABNORMAL LOW (ref 8.9–10.3)
Chloride: 119 mmol/L — ABNORMAL HIGH (ref 101–111)
Creatinine, Ser: 0.87 mg/dL (ref 0.44–1.00)
GFR calc Af Amer: >60 mL/min (ref >60)
GFR calc non Af Amer: 56 mL/min — ABNORMAL LOW (ref >60)
Glucose, Bld: 88 mg/dL (ref 65–99)
Potassium: 4.2 mmol/L (ref 3.5–5.1)
Sodium: 143 mmol/L (ref 135–145)

## 2015-07-20 LAB — HEMOGLOBIN AND HEMATOCRIT, BLOOD
HCT: 28 % — ABNORMAL LOW (ref 36.0–46.0)
Hemoglobin: 9.5 g/dL — ABNORMAL LOW (ref 12.0–15.0)

## 2015-07-20 LAB — ABO/RH: ABO/RH(D): O POS

## 2015-07-20 LAB — PREPARE RBC (CROSSMATCH)

## 2015-07-20 MED ORDER — SODIUM CHLORIDE 0.9 % IV SOLN
INTRAVENOUS | Status: AC
  Administered 2015-07-20: 19:00:00 via INTRAVENOUS

## 2015-07-20 MED ORDER — SODIUM CHLORIDE 0.9 % IV SOLN
Freq: Once | INTRAVENOUS | Status: AC
  Administered 2015-07-20: 06:00:00 via INTRAVENOUS

## 2015-07-20 MED ORDER — IOHEXOL 300 MG/ML  SOLN
100.0000 mL | Freq: Once | INTRAMUSCULAR | Status: AC | PRN
  Administered 2015-07-20: 80 mL via INTRAVENOUS

## 2015-07-20 MED ORDER — BOOST / RESOURCE BREEZE PO LIQD
1.0000 | Freq: Three times a day (TID) | ORAL | Status: DC
  Administered 2015-07-20 – 2015-07-23 (×7): 1 via ORAL

## 2015-07-20 NOTE — Progress Notes (Signed)
[facility] PROGRESS NOTE  NYELLE WOLFSON YNW:[id number] DOB: 04-28-1920 DOA: 07/19/2015 PCP: [name], MD  Brief Summary  Desiree Hutchinson is a 81 y.o. female who presented to the ED with rectal bleeding. Patient reported 3 dark red bloody BMs prior to admission. She denied recent history of NSAID use, anticoagulant use, surgery, h/o cancer, etc. No h/o trauma. Bleeding is painless, has no abdominal pain nor rectal pain.    Assessment/Plan  Probable diverticular bleed.  Her last colonoscopy was in 2006 and demonstrated diverticulosis.  Given her frailty, she is not a good candidate for colonoscopy -  CT scan with IV contrast after she has completed her 2 unit blood transfusion -  Continue tele:  NSR -  Will continue to cycle H&H q6h given ongoing rectal bleeding  Chronic constipation -  Continue miralax, senna and other home medications to keep bowels regular  Severe protein malnutrition with ongoing weight loss -  CLD and advance as tolerated -  Nutrition consultation -  supplementation  COPD, stable, continue spiriva  Chronic diastolic heart failure, appears euvolemic -  Judicious use of IVF in the setting of active bleeding  Diet:  CLD Access:  PIV IVF:  yes Proph:  SCDs  Code Status: full Family Communication: patient and her daughter Disposition Plan:  Pending improvement in rectal bleeding.  PT consult placed   Consultants:  Case discussed with Dr. Juanda Chance  Procedures:  none  Antibiotics:  none   HPI/Subjective:  Denies abdominal pain.  Had rectal bleeding overnight and I was recently called by the RN regarding a second bloody BM.  VSS.  Poor appetite.      Objective: Filed Vitals:   07/20/15 1135 07/20/15 1345 07/20/15 1445 07/20/15 1515  BP: 124/42 134/47 110/49 120/48  Pulse: 70 74 89 74  Temp: 98 F (36.7 C) 98.6 F (37 C) 97.5 F (36.4 C) 97.9 F (36.6 C)  TempSrc: Oral Oral Oral Oral  Resp: 18 18 18 18   Height:      Weight:       SpO2: 100% 100% 100% 99%    Intake/Output Summary (Last 24 hours) at 07/20/15 1548 Last data filed at 07/20/15 1445  Gross per 24 hour  Intake 1141.67 ml  Output      0 ml  Net 1141.67 ml   Filed Weights   07/19/15 2304 07/19/15 2328  Weight: 40.824 kg (90 lb) 41.867 kg (92 lb 4.8 oz)   Body mass index is 18.63 kg/(m^2).  Exam:   General:  Cachectic adult female, No acute distress  HEENT:  NCAT, MMM  Cardiovascular:  RRR, nl S1, S2 no mrg, 2+ pulses, warm extremities  Respiratory:  CTAB, no increased WOB  Abdomen:   Hyperactive BS, soft, mildly distended, nontender  MSK:   Normal tone and bulk, no LEE  Neuro:  Grossly intact  Data Reviewed: Basic Metabolic Panel:  Recent Labs Lab 07/19/15 1911 07/20/15 0540  NA 140 143  K 4.0 4.2  CL 113* 119*  CO2 21* 20*  GLUCOSE 101* 88  BUN 29* 30*  CREATININE 0.93 0.87  CALCIUM 9.0 8.5*   Liver Function Tests: No results for input(s): AST, ALT, ALKPHOS, BILITOT, PROT, ALBUMIN in the last 168 hours. No results for input(s): LIPASE, AMYLASE in the last 168 hours. No results for input(s): AMMONIA in the last 168 hours. CBC:  Recent Labs Lab 07/19/15 1911 07/20/15 0540  WBC 4.2 6.5  NEUTROABS 2.6  --   HGB 9.6*  6.9*  HCT 29.6* 20.6*  MCV 92.8 93.2  PLT 173 126*    No results found for this or any previous visit (from the past 240 hour(s)).   Studies: No results found.  Scheduled Meds: . sodium chloride  3 mL Intravenous Q12H  . tiotropium  1 capsule Inhalation Daily   Continuous Infusions: . sodium chloride 100 mL/hr at 07/20/15 4098    Active Problems:   Essential hypertension   Lower GI bleed   Hematochezia   Acute blood loss anemia    Time spent: 30 min    Shamond Skelton, [facility]  [facility] Pager [phone]. If 7PM-7AM, please contact night-coverage at [email or web address], password [facility] 07/20/2015, 3:48 PM  LOS: 1 day

## 2015-07-20 NOTE — Progress Notes (Signed)
Paged Dr. Clearence Ped concerning pt had bloody BM with clots. Pt stable.

## 2015-07-20 NOTE — Progress Notes (Signed)
Patient with bloody bowel movement. Kirtland Bouchard Schorr paged. No page returned. Will continue to monitor closely.

## 2015-07-21 DIAGNOSIS — E46 Unspecified protein-calorie malnutrition: Secondary | ICD-10-CM | POA: Insufficient documentation

## 2015-07-21 DIAGNOSIS — J449 Chronic obstructive pulmonary disease, unspecified: Secondary | ICD-10-CM

## 2015-07-21 DIAGNOSIS — E43 Unspecified severe protein-calorie malnutrition: Secondary | ICD-10-CM

## 2015-07-21 LAB — BASIC METABOLIC PANEL
Anion gap: 2 — ABNORMAL LOW (ref 5–15)
BUN: 27 mg/dL — ABNORMAL HIGH (ref 6–20)
CO2: 17 mmol/L — ABNORMAL LOW (ref 22–32)
Calcium: 7.9 mg/dL — ABNORMAL LOW (ref 8.9–10.3)
Chloride: 120 mmol/L — ABNORMAL HIGH (ref 101–111)
Creatinine, Ser: 0.83 mg/dL (ref 0.44–1.00)
GFR calc Af Amer: >60 mL/min (ref >60)
GFR calc non Af Amer: 59 mL/min — ABNORMAL LOW (ref >60)
Glucose, Bld: 107 mg/dL — ABNORMAL HIGH (ref 65–99)
Potassium: 3.9 mmol/L (ref 3.5–5.1)
Sodium: 139 mmol/L (ref 135–145)

## 2015-07-21 LAB — HEMOGLOBIN AND HEMATOCRIT, BLOOD
HCT: 22.7 % — ABNORMAL LOW (ref 36.0–46.0)
HCT: 21.9 % — ABNORMAL LOW (ref 36.0–46.0)
Hemoglobin: 7.5 g/dL — ABNORMAL LOW (ref 12.0–15.0)
Hemoglobin: 7.3 g/dL — ABNORMAL LOW (ref 12.0–15.0)

## 2015-07-21 LAB — MRSA PCR SCREENING: MRSA by PCR: NEGATIVE

## 2015-07-21 MED ORDER — PANTOPRAZOLE SODIUM 40 MG IV SOLR
40.0000 mg | Freq: Two times a day (BID) | INTRAVENOUS | Status: DC
  Administered 2015-07-21 – 2015-07-23 (×4): 40 mg via INTRAVENOUS
  Filled 2015-07-21 (×4): qty 40

## 2015-07-21 NOTE — Progress Notes (Signed)
Initial Nutrition Assessment  DOCUMENTATION CODES:   Severe malnutrition in context of chronic illness, Underweight  INTERVENTION:  - Continue Boost Breeze TID, each supplement provides 250 kcal and 9 grams of protein - Advance diet as medically feasible - RD will continue to monitor for needs  NUTRITION DIAGNOSIS:   Inadequate oral intake related to other (see comment) (current diet order) as evidenced by other (see comment) (CLD does not meet pt's needs).  GOAL:   Patient will meet greater than or equal to 90% of their needs  MONITOR:   Diet advancement, PO intake, Supplement acceptance, Weight trends, Labs, I & O's  REASON FOR ASSESSMENT:   Consult Assessment of nutrition requirement/status  ASSESSMENT:  81 y.o. female who presents to the ED with rectal bleeding. Patient reports 3 dark red bloody BMs last occuring at 5pm today. No bleeding since 5 pm. Has not tried anything for symptoms. Came to ED after this occurred. Does not have any recent history of NSAID use, anticoagulant use, surgery, h/o cancer, etc. No h/o trauma. Bleeding is painless, has no abdominal pain nor rectal pain.  Pt seen for consult. BMI indicates underweight status. Pt on CLD and visualized lunch tray with 50% completion and pt reports she is going to continue consuming liquids after RD visit. Pt reports good appetite at home and that she drinks Ensure at home. Granddaughter at bedside reports that Ensure is available but that pt typically does not drink it; pt confirms this after being asked.   Pt and granddaughter confirm that pt has received Boost Breeze and has drank 2 cartons since admission. Pt states she likes the supplement. Talked with her about the importance of them for protein given current diet order and pt is thankful for information; encouraged her to drink as much of them as she is able when she receives supplement.  Severe muscle and fat wasting noted. Per weight hx review, pt has  lost 5 lbs (5% body weight) over the past 3 months which is not significant for time frame. Not meeting needs. Will add order for Ensure Enlive with diet advancement and encourage intake of this supplement as well. Medications reviewed. Labs reviewed; Cl: 120 mmol/L, BUN elevated, Ca: 7.9 mg/dL.   Diet Order:  Diet clear liquid Room service appropriate?: Yes; Fluid consistency:: Thin  Skin:  Reviewed, no issues  Last BM:  7/25  Height:   Ht Readings from Last 1 Encounters:  07/19/15  (1.499 m)    Weight:   Wt Readings from Last 1 Encounters:  07/19/15 92 lb 4.8 oz (41.867 kg)    Ideal Body Weight:  44.1 kg (kg)  Wt Readings from Last 10 Encounters:  07/19/15 92 lb 4.8 oz (41.867 kg)  04/23/15 97 lb (43.999 kg)  04/18/15 95 lb (43.092 kg)  01/10/15 97 lb (43.999 kg)  12/26/14 99 lb 3.2 oz (44.997 kg)  10/02/14 104 lb 3.2 oz (47.265 kg)  09/11/14 102 lb 8 oz (46.494 kg)  03/15/14 111 lb 6.4 oz (50.531 kg)  01/24/14 116 lb 6.4 oz (52.799 kg)  01/08/14 113 lb (51.256 kg)    BMI:  Body mass index is 18.63 kg/(m^2).  Estimated Nutritional Needs:   Kcal:  1050-1250  Protein:  45-55 grams  Fluid:  2.2 L/day  EDUCATION NEEDS:   No education needs identified at this time     Trenton Gammon, RD, LDN Inpatient Clinical Dietitian Pager # [phone] After hours/weekend pager # [phone]

## 2015-07-21 NOTE — Care Management Important Message (Signed)
Important Message  Patient Details  Name: Desiree Hutchinson MRN: [id number] Date of Birth: [birth year]   Medicare Important Message Given:  Yes-second notification given    Haskell Flirt 07/21/2015, 1:25 PMImportant Message  Patient Details  Name: Desiree Hutchinson MRN: [id number] Date of Birth: 11-Dec-1920   Medicare Important Message Given:  Yes-second notification given    Haskell Flirt 07/21/2015, 1:23 PM

## 2015-07-21 NOTE — Evaluation (Signed)
Physical Therapy Evaluation Patient Details Name: KINDA POTTLE MRN: [id number] DOB: 1920-11-16 Today's Date: 07/21/2015   History of Present Illness  81 yo female admitted with LGIB, acute blood loss anemia.   Clinical Impression  On eval, pt required Min assist for mobility-walked ~125 feet. Dyspnea 3/4 with activity. Pt is weak and unsteady. At this time, recommend HHPT and RW (if pt/family are agreeable). Pt may be hesitant to agree to RW use.     Follow Up Recommendations Home health PT;Intermittent supervision/assist    Equipment Recommendations  Rolling walker with 5" wheels (depending on progress)    Recommendations for Other Services       Precautions / Restrictions Precautions Precautions: Fall Restrictions Weight Bearing Restrictions: No      Mobility  Bed Mobility Overal bed mobility: Needs Assistance Bed Mobility: Supine to Sit;Sit to Supine     Supine to sit: Min guard Sit to supine: Min assist   General bed mobility comments: Increased time. Assist for LEs back onto bed.   Transfers Overall transfer level: Needs assistance Equipment used: Rolling walker (2 wheeled) Transfers: Sit to/from Stand Sit to Stand: Min assist         General transfer comment: Assist to rise, stabilize, control descent.   Ambulation/Gait Ambulation/Gait assistance: Min assist Ambulation Distance (Feet): 125 Feet Assistive device: Rolling walker (2 wheeled) Gait Pattern/deviations: Trunk flexed;Decreased stride length     General Gait Details: 2 standing rest breaks needed/taken due to dyspnea, fatigue. Dyspnea 3/4. O2 sats 93% on RA. Pt is unsteady and fatigues fairly easily  Stairs            Wheelchair Mobility    Modified Rankin (Stroke Patients Only)       Balance Overall balance assessment: Needs assistance         Standing balance support: During functional activity Standing balance-Leahy Scale: Poor                                Pertinent Vitals/Pain Pain Assessment: No/denies pain    Home Living Family/patient expects to be discharged to:: Private residence Living Arrangements: Other relatives (granddaughter) Available Help at Discharge: Family Type of Home: House Home Access: Stairs to enter   Secretary/administrator of Steps: 2 Home Layout: One level Home Equipment: None      Prior Function Level of Independence: Independent               Hand Dominance        Extremity/Trunk Assessment   Upper Extremity Assessment: Generalized weakness           Lower Extremity Assessment: Generalized weakness      Cervical / Trunk Assessment: Kyphotic  Communication   Communication: No difficulties  Cognition Arousal/Alertness: Awake/alert Behavior During Therapy: WFL for tasks assessed/performed Overall Cognitive Status: Within Functional Limits for tasks assessed                      General Comments      Exercises        Assessment/Plan    PT Assessment Patient needs continued PT services  PT Diagnosis Difficulty walking;Generalized weakness   PT Problem List Decreased strength;Decreased activity tolerance;Decreased balance;Decreased mobility;Decreased knowledge of use of DME  PT Treatment Interventions DME instruction;Gait training;Functional mobility training;Therapeutic activities;Therapeutic exercise;Patient/family education;Balance training   PT Goals (Current goals can be found in the Care Plan section) Acute Rehab PT Goals  Patient Stated Goal: home soon.  PT Goal Formulation: With patient/family Time For Goal Achievement: 08/04/15 Potential to Achieve Goals: Good    Frequency Min 3X/week   Barriers to discharge        Co-evaluation               End of Session Equipment Utilized During Treatment: Gait belt Activity Tolerance: Patient limited by fatigue Patient left: in bed;with call bell/phone within reach;with family/visitor present            Time: 1610-9604 PT Time Calculation (min) (ACUTE ONLY): 17 min   Charges:   PT Evaluation $Initial PT Evaluation Tier I: 1 Procedure     PT G Codes:        Rebeca Alert, MPT Pager: [phone]

## 2015-07-21 NOTE — Progress Notes (Signed)
Patient with two bloody stools during shift. Patient denies pain. NP on call aware of 7.5 Hgb level. Will pass information to day shift nurse.

## 2015-07-21 NOTE — Progress Notes (Signed)
Patient ID: Desiree Hutchinson, female   DOB: November 29, 1920, 81 y.o.   MRN: [id number] [facility] PROGRESS NOTE  Desiree Hutchinson:[id number] DOB: July 22, 1920 DOA: 07/19/2015 PCP: [name], MD  Brief narrative:    81 y.o. female with past medical history of hypertension who presented to [facility] long hospital with rectal bleed. Apparently patient had 3 episodes of dark red blood in stool. Hemoglobin was as low as 6.9 on the admission. Patient is status post 2 units of PRBC transfusion.  Anticipated discharge: 07/22/2015 if hemoglobin remains stable.  Assessment/Plan:    Principal problem: Acute blood loss anemia / lower GI bleed - Patient with painless lower GI bleed, likely diverticular - Patient has history of diverticulosis as seen on colonoscopy in 2006 - CT abdomen on the admission revealed diffuse colitis but no perforation or obstruction. She was not started on any anti-biotics at the time of the admission because she does not have a fever and her white blood cell count is within normal limits. - Hemoglobin is 7.3 this morning. Continue to monitor CBC daily - Will start Protonix 40 mg IV every 12 hours.  Active problems: Chronic constipation - Had 1 BM in past 24 hours   Severe protein calorie malnutrition  - Nutrition consulted  COPD - Stable, continue spiriva  Chronic diastolic heart failure - Compensated     DVT Prophylaxis  - SCD's bilaterally due to risk of bleed    Code Status: Full.  Family Communication:  plan of care discussed with the patient and her daughter at the bedside  Disposition Plan: Home likely 07/22/2015.   IV access:  Peripheral IV  Procedures and diagnostic studies:    Ct Abdomen Pelvis W Contrast 07/20/2015  Mild generalized colonic abnormality most consistent with a diffuse colitis. C difficile can produce this appearance although the findings are nonspecific. Negative for obstruction, perforation or focal inflammatory change.  No  other acute findings are evident.  No other interval changes.     Medical Consultants:  Gi - phone call only   Other Consultants:  Physical therapy Nutrition  IAnti-Infectives:   None    Rennie Hack, MD  [facility] Pager [phone]  Time spent in minutes: 25 minutes  If 7PM-7AM, please contact night-coverage [email or web address] Password [email or web address] 07/21/2015, 2:50 PM   LOS: 2 days    HPI/Subjective: No acute overnight events. Patient reports no nausea.   Objective: Filed Vitals:   07/20/15 2126 07/21/15 0446 07/21/15 0912 07/21/15 1302  BP: 124/52 116/46  107/46  Pulse: 71 65  60  Temp: 97.4 F (36.3 C) 98 F (36.7 C)  97.8 F (36.6 C)  TempSrc: Oral Oral  Oral  Resp: 18 16  16   Height:      Weight:      SpO2: 100% 100% 100% 100%    Intake/Output Summary (Last 24 hours) at 07/21/15 1450 Last data filed at 07/21/15 1015  Gross per 24 hour  Intake 1346.67 ml  Output    301 ml  Net 1045.67 ml    Exam:   General:  Pt is alert,  not in acute distress  Cardiovascular: Regular rate and rhythm, S1/S2 (+)  Respiratory: No wheezing, no crackles, no rhonchi  Abdomen: Soft, non tender, non distended, bowel sounds present  Extremities: No edema, pulses DP and PT palpable bilaterally  Neuro: Grossly nonfocal  Data Reviewed: Basic Metabolic Panel:  Recent Labs Lab 07/19/15 1911 07/20/15 0540 07/21/15 0059  NA 140 143 139  K 4.0  4.2 3.9  CL 113* 119* 120*  CO2 21* 20* 17*  GLUCOSE 101* 88 107*  BUN 29* 30* 27*  CREATININE 0.93 0.87 0.83  CALCIUM 9.0 8.5* 7.9*   Liver Function Tests: No results for input(s): AST, ALT, ALKPHOS, BILITOT, PROT, ALBUMIN in the last 168 hours. No results for input(s): LIPASE, AMYLASE in the last 168 hours. No results for input(s): AMMONIA in the last 168 hours. CBC:  Recent Labs Lab 07/19/15 1911 07/20/15 0540 07/20/15 1818 07/21/15 0059 07/21/15 0532  WBC 4.2 6.5  --   --   --   NEUTROABS 2.6  --   --   --   --    HGB 9.6* 6.9* 9.5* 7.5* 7.3*  HCT 29.6* 20.6* 28.0* 22.7* 21.9*  MCV 92.8 93.2  --   --   --   PLT 173 126*  --   --   --    Cardiac Enzymes: No results for input(s): CKTOTAL, CKMB, CKMBINDEX, TROPONINI in the last 168 hours. BNP: Invalid input(s): POCBNP CBG: No results for input(s): GLUCAP in the last 168 hours.  Recent Results (from the past 240 hour(s))  MRSA PCR Screening     Status: None   Collection Time: 07/21/15  9:53 AM  Result Value Ref Range Status   MRSA by PCR NEGATIVE NEGATIVE Final     Scheduled Meds: . feeding supplement  1 Container Oral TID BM  . tiotropium  1 capsule Inhalation Daily

## 2015-07-22 LAB — BASIC METABOLIC PANEL
Anion gap: 4 — ABNORMAL LOW (ref 5–15)
BUN: 21 mg/dL — ABNORMAL HIGH (ref 6–20)
CO2: 21 mmol/L — ABNORMAL LOW (ref 22–32)
Calcium: 8.8 mg/dL — ABNORMAL LOW (ref 8.9–10.3)
Chloride: 116 mmol/L — ABNORMAL HIGH (ref 101–111)
Creatinine, Ser: 0.98 mg/dL (ref 0.44–1.00)
GFR calc Af Amer: 56 mL/min — ABNORMAL LOW (ref >60)
GFR calc non Af Amer: 48 mL/min — ABNORMAL LOW (ref >60)
Glucose, Bld: 92 mg/dL (ref 65–99)
Potassium: 4 mmol/L (ref 3.5–5.1)
Sodium: 141 mmol/L (ref 135–145)

## 2015-07-22 LAB — CBC
HCT: 22 % — ABNORMAL LOW (ref 36.0–46.0)
Hemoglobin: 7.4 g/dL — ABNORMAL LOW (ref 12.0–15.0)
MCH: 31.1 pg (ref 26.0–34.0)
MCHC: 33.6 g/dL (ref 30.0–36.0)
MCV: 92.4 fL (ref 78.0–100.0)
Platelets: 124 10*3/uL — ABNORMAL LOW (ref 150–400)
RBC: 2.38 MIL/uL — ABNORMAL LOW (ref 3.87–5.11)
RDW: 15.8 % — ABNORMAL HIGH (ref 11.5–15.5)
WBC: 5.7 10*3/uL (ref 4.0–10.5)

## 2015-07-22 LAB — PREPARE RBC (CROSSMATCH)

## 2015-07-22 MED ORDER — POLYETHYLENE GLYCOL 3350 17 G PO PACK
17.0000 g | PACK | Freq: Every day | ORAL | Status: DC
  Administered 2015-07-22 – 2015-07-23 (×2): 17 g via ORAL
  Filled 2015-07-22 (×2): qty 1

## 2015-07-22 MED ORDER — DOCUSATE SODIUM 100 MG PO CAPS
100.0000 mg | ORAL_CAPSULE | Freq: Two times a day (BID) | ORAL | Status: DC
  Administered 2015-07-22 – 2015-07-23 (×2): 100 mg via ORAL
  Filled 2015-07-22 (×2): qty 1

## 2015-07-22 MED ORDER — SODIUM CHLORIDE 0.9 % IV SOLN
Freq: Once | INTRAVENOUS | Status: AC
  Administered 2015-07-22: 11:00:00 via INTRAVENOUS

## 2015-07-22 NOTE — Progress Notes (Signed)
Patient ID: Desiree Hutchinson, female   DOB: 10-24-1920, 81 y.o.   MRN: [id number] [facility] PROGRESS NOTE  Desiree Hutchinson WUJ:[id number] DOB: 1920-07-10 DOA: 07/19/2015 PCP: [name], MD  Brief narrative:    81 y.o. female with past medical history of hypertension who presented to [facility] with rectal bleed. Apparently patient had 3 episodes of dark red blood in stool. Hemoglobin was as low as 6.9 on the admission. Patient is status post 2 units of PRBC transfusion.  Anticipated discharge: 07/23/2015 if hemoglobin remains stable. She will get 1 unit PRBC today.   Assessment/Plan:    Principal problem: Acute blood loss anemia / lower GI bleed - Patient with painless lower GI bleed, likely diverticular - Patient has history of diverticulosis as seen on colonoscopy in 2006 - CT abdomen on the admission revealed diffuse colitis but no perforation or obstruction. She was not started on any anti-biotics at the time of the admission because she did not have a fever and her white blood cell count was within normal limits. - Her hemoglobin is 7.3 and ranges 7.3 to 7.5. We will transfuse 1 unit of PRBC today. - Follow up Hgb tomorrow.  - Continue PPI therapy   Active problems: Chronic constipation - Had 1 BM in past 48 hours  - Added miralax and colace   Severe protein calorie malnutrition  - Nutrition consulted - Continue nutritional supplementation  COPD - Stable, continue spiriva  Chronic diastolic heart failure - Compensated     DVT Prophylaxis  - SCD's bilaterally due to risk of bleed    Code Status: Full.  Family Communication:  plan of care discussed with the patient and her granddaughter at the bedside  Disposition Plan: Home likely 07/23/2015.   IV access:  Peripheral IV  Procedures and diagnostic studies:    Ct Abdomen Pelvis W Contrast 07/20/2015  Mild generalized colonic abnormality most consistent with a diffuse colitis. C difficile can  produce this appearance although the findings are nonspecific. Negative for obstruction, perforation or focal inflammatory change.  No other acute findings are evident.  No other interval changes.     Medical Consultants:  Gi - phone call only   Other Consultants:  Physical therapy Nutrition  IAnti-Infectives:   None    DEVINE, ALMA, MD  [facility] Pager [phone]  Time spent in minutes: 15 minutes  If 7PM-7AM, please contact night-coverage [email or web address] Password [email or web address] 07/22/2015, 9:01 PM   LOS: 3 days    HPI/Subjective: No acute overnight events. Patient reports no nausea. Had small BM with blood in it.   Objective: Filed Vitals:   07/22/15 1149 07/22/15 1420 07/22/15 1449 07/22/15 2055  BP:  99/49 106/47 116/55  Pulse: 70 72 74 69  Temp:  98 F (36.7 C) 98.1 F (36.7 C) 97.9 F (36.6 C)  TempSrc:  Oral Oral Oral  Resp: 15 18 22 18   Height:      Weight:      SpO2: 99% 100% 100% 100%    Intake/Output Summary (Last 24 hours) at 07/22/15 2101 Last data filed at 07/22/15 1844  Gross per 24 hour  Intake    540 ml  Output    750 ml  Net   -210 ml    Exam:   General:  Pt is alert, alert  Cardiovascular: Regular rate and rhythm, appreciate S1,S2  Respiratory: bilateral air entry, no wheezing   Abdomen: (+) BS, non tender and non distended abd   Extremities: No  leg swelling, pulses palpable   Neuro: Nonfocal  Data Reviewed: Basic Metabolic Panel:  Recent Labs Lab 07/19/15 1911 07/20/15 0540 07/21/15 0059 07/22/15 0511  NA 140 143 139 141  K 4.0 4.2 3.9 4.0  CL 113* 119* 120* 116*  CO2 21* 20* 17* 21*  GLUCOSE 101* 88 107* 92  BUN 29* 30* 27* 21*  CREATININE 0.93 0.87 0.83 0.98  CALCIUM 9.0 8.5* 7.9* 8.8*   Liver Function Tests: No results for input(s): AST, ALT, ALKPHOS, BILITOT, PROT, ALBUMIN in the last 168 hours. No results for input(s): LIPASE, AMYLASE in the last 168 hours. No results for input(s): AMMONIA in the last 168  hours. CBC:  Recent Labs Lab 07/19/15 1911 07/20/15 0540 07/20/15 1818 07/21/15 0059 07/21/15 0532 07/22/15 0511  WBC 4.2 6.5  --   --   --  5.7  NEUTROABS 2.6  --   --   --   --   --   HGB 9.6* 6.9* 9.5* 7.5* 7.3* 7.4*  HCT 29.6* 20.6* 28.0* 22.7* 21.9* 22.0*  MCV 92.8 93.2  --   --   --  92.4  PLT 173 126*  --   --   --  124*   Cardiac Enzymes: No results for input(s): CKTOTAL, CKMB, CKMBINDEX, TROPONINI in the last 168 hours. BNP: Invalid input(s): POCBNP CBG: No results for input(s): GLUCAP in the last 168 hours.  Recent Results (from the past 240 hour(s))  MRSA PCR Screening     Status: None   Collection Time: 07/21/15  9:53 AM  Result Value Ref Range Status   MRSA by PCR NEGATIVE NEGATIVE Final     . docusate sodium  100 mg Oral BID  . feeding supplement  1 Container Oral TID BM  . pantoprazole (PROTONIX) IV  40 mg Intravenous Q12H  . polyethylene glycol  17 g Oral Daily  . sodium chloride  3 mL Intravenous Q12H  . tiotropium  1 capsule Inhalation Daily

## 2015-07-22 NOTE — Progress Notes (Signed)
Physical Therapy Treatment Patient Details Name: Desiree Hutchinson MRN: [id number] DOB: July 16, 1920 Today's Date: 07/22/2015    History of Present Illness 81 yo female admitted with LGIB, acute blood loss anemia.     PT Comments    Pt remains weak and unsteady. Walked ~100 feet with rollator on today.   Follow Up Recommendations  Home health PT     Equipment Recommendations  Rolling walker with 5" wheels    Recommendations for Other Services       Precautions / Restrictions Precautions Precautions: Fall Restrictions Weight Bearing Restrictions: No    Mobility  Bed Mobility Overal bed mobility: Needs Assistance Bed Mobility: Supine to Sit;Sit to Supine     Supine to sit: Min guard Sit to supine: Min assist   General bed mobility comments: Increased time. Assist for LEs back onto bed.   Transfers Overall transfer level: Needs assistance Equipment used: Rolling walker (2 wheeled) Transfers: Sit to/from Stand Sit to Stand: Min guard         General transfer comment: close guard for safety. VCs hand placement  Ambulation/Gait Ambulation/Gait assistance: Min guard Ambulation Distance (Feet): 100 Feet Assistive device: 4-wheeled walker Gait Pattern/deviations: Step-through pattern;Decreased stride length;Trunk flexed     General Gait Details:  Pt remains unsteady and fatigues fairly easily.    Stairs            Wheelchair Mobility    Modified Rankin (Stroke Patients Only)       Balance                                    Cognition Arousal/Alertness: Awake/alert Behavior During Therapy: WFL for tasks assessed/performed Overall Cognitive Status: Within Functional Limits for tasks assessed                      Exercises      General Comments        Pertinent Vitals/Pain Pain Assessment: No/denies pain    Home Living                      Prior Function            PT Goals (current goals can now be  found in the care plan section) Progress towards PT goals: Progressing toward goals    Frequency  Min 3X/week    PT Plan Current plan remains appropriate    Co-evaluation             End of Session Equipment Utilized During Treatment: Gait belt Activity Tolerance: Patient limited by fatigue Patient left: in bed;with call bell/phone within reach     Time: 1005-1019 PT Time Calculation (min) (ACUTE ONLY): 14 min  Charges:  $Gait Training: 8-22 mins                    G Codes:      Rebeca Alert, MPT Pager: [phone]

## 2015-07-23 DIAGNOSIS — K5731 Diverticulosis of large intestine without perforation or abscess with bleeding: Principal | ICD-10-CM

## 2015-07-23 DIAGNOSIS — I359 Nonrheumatic aortic valve disorder, unspecified: Secondary | ICD-10-CM

## 2015-07-23 DIAGNOSIS — K5900 Constipation, unspecified: Secondary | ICD-10-CM

## 2015-07-23 DIAGNOSIS — J432 Centrilobular emphysema: Secondary | ICD-10-CM

## 2015-07-23 LAB — CBC
HCT: 27.2 % — ABNORMAL LOW (ref 36.0–46.0)
Hemoglobin: 9.2 g/dL — ABNORMAL LOW (ref 12.0–15.0)
MCH: 30.6 pg (ref 26.0–34.0)
MCHC: 33.8 g/dL (ref 30.0–36.0)
MCV: 90.4 fL (ref 78.0–100.0)
Platelets: 127 10*3/uL — ABNORMAL LOW (ref 150–400)
RBC: 3.01 MIL/uL — ABNORMAL LOW (ref 3.87–5.11)
RDW: 16 % — ABNORMAL HIGH (ref 11.5–15.5)
WBC: 5.9 10*3/uL (ref 4.0–10.5)

## 2015-07-23 LAB — TYPE AND SCREEN
ABO/RH(D): O POS
Antibody Screen: NEGATIVE
Unit division: 0
Unit division: 0
Unit division: 0

## 2015-07-23 LAB — BASIC METABOLIC PANEL
Anion gap: 5 (ref 5–15)
BUN: 17 mg/dL (ref 6–20)
CO2: 22 mmol/L (ref 22–32)
Calcium: 8.7 mg/dL — ABNORMAL LOW (ref 8.9–10.3)
Chloride: 112 mmol/L — ABNORMAL HIGH (ref 101–111)
Creatinine, Ser: 1.06 mg/dL — ABNORMAL HIGH (ref 0.44–1.00)
GFR calc Af Amer: 51 mL/min — ABNORMAL LOW (ref >60)
GFR calc non Af Amer: 44 mL/min — ABNORMAL LOW (ref >60)
Glucose, Bld: 91 mg/dL (ref 65–99)
Potassium: 4 mmol/L (ref 3.5–5.1)
Sodium: 139 mmol/L (ref 135–145)

## 2015-07-23 MED ORDER — POLYETHYLENE GLYCOL 3350 17 GM/SCOOP PO POWD
17.0000 g | Freq: Every day | ORAL | Status: DC
Start: 2015-07-23 — End: 2016-09-28

## 2015-07-23 MED ORDER — ENSURE ENLIVE PO LIQD: 237.0000 mL | Freq: Every morning | ORAL | Status: DC

## 2015-07-23 MED ORDER — ENSURE ENLIVE PO LIQD
237.0000 mL | Freq: Every morning | ORAL | Status: DC
  Administered 2015-07-23: 237 mL via ORAL

## 2015-07-23 MED ORDER — BISACODYL 10 MG RE SUPP
10.0000 mg | Freq: Once | RECTAL | Status: AC
  Administered 2015-07-23: 10 mg via RECTAL
  Filled 2015-07-23: qty 1

## 2015-07-23 MED ORDER — FERROUS SULFATE 325 (65 FE) MG PO TBEC: 325.0000 mg | DELAYED_RELEASE_TABLET | Freq: Every day | ORAL | Status: DC

## 2015-07-23 MED ORDER — BOOST / RESOURCE BREEZE PO LIQD: 1.0000 | Freq: Three times a day (TID) | ORAL | Status: DC

## 2015-07-23 NOTE — Progress Notes (Signed)
Spoke with pt and daughter at bedside concerning home health needs.  Pt and daughter states that pt will not need HHPT at present time. Information given to daughter and pt for [facility] program.

## 2015-07-23 NOTE — Progress Notes (Signed)
Patient discharged home with grand-daughter, discharge instructions given and explained to patient/grand-daughter and they verbalized understanding, denies any pain/distress. No wound noted, skin intact.  Accompanied home by grand-daughter, transported to the car by staff via wheelchair.

## 2015-07-23 NOTE — Progress Notes (Signed)
Nutrition Follow-up  DOCUMENTATION CODES:   Severe malnutrition in context of chronic illness, Underweight  INTERVENTION:  - Continue Boost Breeze TID -  Will order Ensure Enlive once/day which will provides 350 kcal, 20 grams protein - RD will continue to monitor for needs  NUTRITION DIAGNOSIS:   Inadequate oral intake related to other (see comment) (current diet order) as evidenced by other (see comment) (CLD does not meet pt's needs). -improving with diet advancement  GOAL:   Patient will meet greater than or equal to 90% of their needs -not fully met at this time  MONITOR:   Diet advancement, PO intake, Supplement acceptance, Weight trends, Labs, I & O's  ASSESSMENT:  81 y.o. female who presents to the ED with rectal bleeding. Patient reports 3 dark red bloody BMs last occuring at 5pm today. No bleeding since 5 pm. Has not tried anything for symptoms. Came to ED after this occurred. Does not have any recent history of NSAID use, anticoagulant use, surgery, h/o cancer, etc. No h/o trauma. Bleeding is painless, has no abdominal pain nor rectal pain.  Diet advanced from CLD to Regular yesterday evening. Pt ate 75% of breakfast this AM without issue. Still not fully meeting needs. Will order Ensure Enlive once/day. Medications reviewed. Labs reviewed; Cl: 112 mmol/L, creatinine elevated, Ca: 8.7 mg/dL, GFR: 51.  Diet Order:  Diet regular Room service appropriate?: Yes; Fluid consistency:: Thin  Skin:  Reviewed, no issues  Last BM:  7/25  Height:   Ht Readings from Last 1 Encounters:  07/19/15 _0  (1.499 m)    Weight:   Wt Readings from Last 1 Encounters:  07/19/15 92 lb 4.8 oz (41.867 kg)    Ideal Body Weight:  44.1 kg (kg)  BMI:  Body mass index is 18.63 kg/(m^2).  Estimated Nutritional Needs:   Kcal:  1050-1250  Protein:  45-55 grams  Fluid:  2.2 L/day  EDUCATION NEEDS:   No education needs identified at this time     Jarome Matin, RD,  LDN Inpatient Clinical Dietitian Pager # [phone] After hours/weekend pager # [phone]

## 2015-07-23 NOTE — Discharge Summary (Signed)
Physician Discharge Summary  Desiree Hutchinson ZOX:[id number] DOB: July 18, 1920 DOA: 07/19/2015  PCP: [name], MD  Admit date: 07/19/2015 Discharge date: 07/23/2015  Recommendations for Outpatient Follow-up:  1.  Follow-up with primary care doctor in approximately 1 week to repeat CBC to follow-up anemia and thrombocytopenia. 2.  Started iron supplementation and daily miralax.  Discharge Diagnoses:  Principal Problem:   Diverticulosis of colon with hemorrhage Active Problems:   Essential hypertension   Aortic valve disorder   COPD with emphysema Gold Stage A   Constipation   Lower GI bleed   Acute blood loss anemia   Protein-calorie malnutrition, severe   Discharge Condition: Stable, improved  Diet recommendation: Regular  Wt Readings from Last 3 Encounters:  07/19/15 41.867 kg (92 lb 4.8 oz)  04/23/15 43.999 kg (97 lb)  04/18/15 43.092 kg (95 lb)    History of present illness:  81 y.o. female with past medical history of hypertension who presented to [facility] with rectal bleed. Apparently patient had 3 episodes of dark red blood in stool. Hemoglobin was as low as 6.9 on the admission. Patient is status post 2 units of PRBC transfusion.  Hospital Course:   Acute blood loss anemia and lower GI bleed likely due to diverticulosis with hemorrhage.  Her blood pressure medications and aspirin were held.  After reviewing her previous colonoscopy report from 2006 she has history of diverticulosis. The case was discussed with gastroenterology, Dr. Juanda Chance, who recommended a CT scan of abdomen and pelvis to exclude obvious malignancy. Her CT scan demonstrated diffuse colitis without evidence of perforation or obstruction. There is no evidence of malignancy on her CT scan.  She did not have diarrhea or abdominal pain that would suggest ischemic colitis. She was transfused a total of 2 units of packed red blood cells during this hospitalization and her hemoglobin is 9.2 on the  date of discharge.  She has not had any rectal bleeding in three days.  Due to her frailty, severe protein calorie malnutrition, she is not a good candidate for endoscopy at this point.  Recommend that she take iron supplements and work on improving her nutrition. If she has recurrent bleeding, she is welcome to come back to the hospital for further blood transfusions. If they become recurrent, recommend palliative care consultation and possible follow-up in the hematology clinic for IV iron infusions.  Chronic constipation, advised her to use her MiraLAX on a regular schedule and she can adjust the dose to half a packet every day or 1 packet every other day if needed.  Severe protein calorie malnutrition. She was seen by nutrition who recommended additional supplements.  COPD, stable, continue Spiriva  Chronic diastolic heart failure, euvolemic, and not a good candidate for direct secondary to poor by mouth intake and malnutrition. She is advised to follow-up with her primary care doctor if she develops lower extremity swelling or difficulty breathing.  Aortic stenosis, per cardiology.    Essential hypertension with hypotension secondary to acute blood loss.  Held her blood pressure medications.  IV access:  Peripheral IV  Procedures and diagnostic studies:   Ct Abdomen Pelvis W Contrast 07/20/2015 Mild generalized colonic abnormality most consistent with a diffuse colitis. C difficile can produce this appearance although the findings are nonspecific. Negative for obstruction, perforation or focal inflammatory change. No other acute findings are evident. No other interval changes.   Medical Consultants:  Gi - phone call only   Other Consultants:  Physical therapy Nutrition  IAnti-Infectives:  None        Discharge Exam: Filed Vitals:   07/23/15 1319  BP: 128/54  Pulse: 78  Temp: 98.4 F (36.9 C)  Resp: 24   Filed Vitals:   07/22/15 1449 07/22/15 2055  07/23/15 0500 07/23/15 1319  BP: 106/47 116/55 121/65 128/54  Pulse: 74 69 65 78  Temp: 98.1 F (36.7 C) 97.9 F (36.6 C) 97.5 F (36.4 C) 98.4 F (36.9 C)  TempSrc: Oral Oral Oral Oral  Resp: 22 18 18 24   Height:      Weight:      SpO2: 100% 100% 100% 100%    General: Cachectic female, no acute distress Cardiovascular: Regular rate and rhythm, 3/6 systolic murmur at the left sternal border Respiratory: Diminished bilateral breath sounds, no obvious wheezes, rales, rhonchi Abdomen: NABS, soft, scaphoid, nontender MSK: No lower extremity edema, decreased muscle bulk Neuro: Grossly moves all extremities  Discharge Instructions      Discharge Instructions    Call MD for:  difficulty breathing, headache or visual disturbances    Complete by:  As directed      Call MD for:  extreme fatigue    Complete by:  As directed      Call MD for:  hives    Complete by:  As directed      Call MD for:  persistant dizziness or light-headedness    Complete by:  As directed      Call MD for:  persistant nausea and vomiting    Complete by:  As directed      Call MD for:  severe uncontrolled pain    Complete by:  As directed      Call MD for:  temperature >100.4    Complete by:  As directed      Diet general    Complete by:  As directed      Discharge instructions    Complete by:  As directed   You were hospitalized with rectal bleeding which was likely secondary to diverticulosis.  Please try to avoid constipation by taking miralax every day.  You may adjust your dose so that you have 1-2 soft bowel movements per day.  You may have bleeding again, but we cannot predict when it may happen.  Please call 911 if you have rectal bleeding again.  Have your primary care doctor check your blood counts in clinic in about one week.  Take iron supplements once daily to rebuild your iron stores which will help your anemia.  Please take supplements.  Please STOP taking your aspirin, hydralazine, and  amlodipine.  Your primary care doctor can talk to you about these medications at your follow up appointment in 1 week.     Increase activity slowly    Complete by:  As directed             Medication List    STOP taking these medications        amLODipine 5 MG tablet  Commonly known as:  NORVASC     aspirin 81 MG tablet     hydrALAZINE 25 MG tablet  Commonly known as:  APRESOLINE     Lactulose 20 GM/30ML Soln     polyethylene glycol packet  Commonly known as:  MIRALAX / GLYCOLAX  Replaced by:  polyethylene glycol powder powder      TAKE these medications        feeding supplement Liqd  Take 1 Container by mouth 3 (three) times daily  between meals.     feeding supplement (ENSURE ENLIVE) Liqd  Take 237 mLs by mouth every morning.     ferrous sulfate 325 (65 FE) MG EC tablet  Take 1 tablet (325 mg total) by mouth daily with breakfast.     polyethylene glycol powder powder  Commonly known as:  GLYCOLAX/MIRALAX  Take 17 g by mouth daily.     sodium chloride 0.65 % Soln nasal spray  Commonly known as:  OCEAN  Place 1 spray into both nostrils as needed for congestion.     Tiotropium Bromide Monohydrate 2.5 MCG/ACT Aers  Commonly known as:  SPIRIVA RESPIMAT  Inhale 2 puffs into the lungs daily.       Follow-up Information    Follow up with [name], MD. Schedule an appointment as soon as possible for a visit in 1 week.   Specialty:  Endocrinology   Contact information:   [address] [place] [address] [phone]        The results of significant diagnostics from this hospitalization (including imaging, microbiology, ancillary and laboratory) are listed below for reference.    Significant Diagnostic Studies: Ct Abdomen Pelvis W Contrast  07/20/2015   CLINICAL DATA:  Melanotic stools for 3 days.  EXAM: CT ABDOMEN AND PELVIS WITH CONTRAST  TECHNIQUE: Multidetector CT imaging of the abdomen and pelvis was performed using the standard  protocol following bolus administration of intravenous contrast.  CONTRAST:  80mL OMNIPAQUE IOHEXOL 300 MG/ML  SOLN  COMPARISON:  04/29/2015  FINDINGS: There is moderate uniform mural enhancement of the colon. There is mild mural thickening. There is fluid throughout the lumen of the colon. The findings are most suggestive of a mild diffuse colitis. No focal mass or focal inflammation is evident in the colon. Remainder of the bowel is unremarkable. There is no evidence of significant obstruction. There is no extraluminal air.  There are unremarkable appearances of the liver with the exception of an unchanged hypodensity in the right hepatic lobe dome which probably is a benign cysts although it cannot be conclusively characterized. Gallbladder and bile ducts are unremarkable. Pancreas is unremarkable. Spleen, adrenals and kidneys are unremarkable except for a left renal cyst which is unchanged.  The abdominal aorta is diffusely atherosclerotic and there is a mild fusiform dilatation in its midportion up to 2.6 cm. This is not significantly changed.  No acute findings are evident in the lower chest. No significant musculoskeletal lesions are evident.  IMPRESSION: Mild generalized colonic abnormality most consistent with a diffuse colitis. C difficile can produce this appearance although the findings are nonspecific. Negative for obstruction, perforation or focal inflammatory change.  No other acute findings are evident.  No other interval changes.   Electronically Signed   By: Ellery Plunk M.D.   On: 07/20/2015 21:06    Microbiology: Recent Results (from the past 240 hour(s))  MRSA PCR Screening     Status: None   Collection Time: 07/21/15  9:53 AM  Result Value Ref Range Status   MRSA by PCR NEGATIVE NEGATIVE Final    Comment:        The GeneXpert MRSA Assay (FDA approved for NASAL specimens only), is one component of a comprehensive MRSA colonization surveillance program. It is not intended to  diagnose MRSA infection nor to guide or monitor treatment for MRSA infections.      Labs: Basic Metabolic Panel:  Recent Labs Lab 07/19/15 1911 07/20/15 0540 07/21/15 0059 07/22/15 5784 07/23/15 6962  NA 140 143 139 141 139  K 4.0 4.2 3.9 4.0 4.0  CL 113* 119* 120* 116* 112*  CO2 21* 20* 17* 21* 22  GLUCOSE 101* 88 107* 92 91  BUN 29* 30* 27* 21* 17  CREATININE 0.93 0.87 0.83 0.98 1.06*  CALCIUM 9.0 8.5* 7.9* 8.8* 8.7*   Liver Function Tests: No results for input(s): AST, ALT, ALKPHOS, BILITOT, PROT, ALBUMIN in the last 168 hours. No results for input(s): LIPASE, AMYLASE in the last 168 hours. No results for input(s): AMMONIA in the last 168 hours. CBC:  Recent Labs Lab 07/19/15 1911 07/20/15 0540 07/20/15 1818 07/21/15 0059 07/21/15 0532 07/22/15 0511 07/23/15 0523  WBC 4.2 6.5  --   --   --  5.7 5.9  NEUTROABS 2.6  --   --   --   --   --   --   HGB 9.6* 6.9* 9.5* 7.5* 7.3* 7.4* 9.2*  HCT 29.6* 20.6* 28.0* 22.7* 21.9* 22.0* 27.2*  MCV 92.8 93.2  --   --   --  92.4 90.4  PLT 173 126*  --   --   --  124* 127*   Cardiac Enzymes: No results for input(s): CKTOTAL, CKMB, CKMBINDEX, TROPONINI in the last 168 hours. BNP: BNP (last 3 results) No results for input(s): BNP in the last 8760 hours.  ProBNP (last 3 results) No results for input(s): PROBNP in the last 8760 hours.  CBG: No results for input(s): GLUCAP in the last 168 hours.  Time coordinating discharge: 35 minutes  Signed:  Amberlie Gaillard  [facility] 07/23/2015, 4:19 PM

## 2015-07-23 NOTE — Progress Notes (Signed)
Physical Therapy Treatment Patient Details Name: SABRINIA PRIEN MRN: [id number] DOB: 02/03/1920 Today's Date: 07/23/2015    History of Present Illness 81 yo female admitted with LGIB, acute blood loss anemia.     PT Comments    Pt pleasant and AxO x 4.  Assisted OOB to amb in hallway using 4 WW for mild gait instability.  Amb in hallway with one sitting rest break.  Instructed pt on proper use of cruiser walker and safety with locking brakes prior to sit and stand.  Amb back to room then assisted to bathroom. Pt stated she prefers holding to walls and her furniture in her home and that the 4 WW would be too big to get around in her home.  Pt did state she would like a staight cane that folds.  "I could really use it for church".  Spoke to Granddaughter who was in the room about pt's desire for one and that we could only offer her a cane that does not fold.    Follow Up Recommendations  Home health PT (lives with Granddaughter)     Equipment Recommendations   (pt wants a folding cane)  Advised Granddaughter that [employer] and [employer] store sells them  Recommendations for Other Services       Precautions / Restrictions Precautions Precautions: Fall Restrictions Weight Bearing Restrictions: No    Mobility  Bed Mobility Overal bed mobility: Needs Assistance Bed Mobility: Supine to Sit;Sit to Supine     Supine to sit: Supervision Sit to supine: Supervision   General bed mobility comments: Increased time.  Transfers Overall transfer level: Needs assistance Equipment used: Rolling walker (2 wheeled) Transfers: Sit to/from Stand Sit to Stand: Supervision;Min guard         General transfer comment: close guard for safety. VCs hand placement  Ambulation/Gait Ambulation/Gait assistance: Supervision;Min guard Ambulation Distance (Feet): 120 Feet (60 feet x 2) Assistive device: 4-wheeled walker Gait Pattern/deviations: Step-to pattern;Step-through pattern;Trunk  flexed Gait velocity: decreased   General Gait Details: mild unsteadiness.  Used 4 ww due to wide open space(hallway).  Pt prefers to use a cane and hold to the walls and furniture in her home.  4ON would not be practical in her home.    Stairs            Wheelchair Mobility    Modified Rankin (Stroke Patients Only)       Balance                                    Cognition Arousal/Alertness: Awake/alert Behavior During Therapy: WFL for tasks assessed/performed Overall Cognitive Status: Within Functional Limits for tasks assessed                      Exercises      General Comments        Pertinent Vitals/Pain Pain Assessment: No/denies pain    Home Living                      Prior Function            PT Goals (current goals can now be found in the care plan section) Progress towards PT goals: Progressing toward goals    Frequency  Min 3X/week    PT Plan Current plan remains appropriate    Co-evaluation  End of Session Equipment Utilized During Treatment: Gait belt Activity Tolerance: Patient tolerated treatment well Patient left: in bed;with call bell/phone within reach     Time: 1035-1100 PT Time Calculation (min) (ACUTE ONLY): 25 min  Charges:  $Gait Training: 8-22 mins $Therapeutic Activity: 8-22 mins                    G Codes:      Felecia Shelling  PTA [facility]  Acute  Rehab Pager      [phone]

## 2015-08-05 ENCOUNTER — Encounter (HOSPITAL_COMMUNITY): Payer: Self-pay | Admitting: Emergency Medicine

## 2015-08-05 ENCOUNTER — Inpatient Hospital Stay (HOSPITAL_COMMUNITY)
Admission: EM | Admit: 2015-08-05 | Discharge: 2015-08-22 | DRG: 335 | Disposition: A | Discharge status: Skilled Nursing Facility | Payer: Medicare Other | Attending: Internal Medicine | Admitting: Internal Medicine

## 2015-08-05 DIAGNOSIS — N3289 Other specified disorders of bladder: Secondary | ICD-10-CM | POA: Diagnosis not present

## 2015-08-05 DIAGNOSIS — R54 Age-related physical debility: Secondary | ICD-10-CM | POA: Diagnosis not present

## 2015-08-05 DIAGNOSIS — E43 Unspecified severe protein-calorie malnutrition: Secondary | ICD-10-CM | POA: Diagnosis not present

## 2015-08-05 DIAGNOSIS — R1084 Generalized abdominal pain: Secondary | ICD-10-CM | POA: Diagnosis not present

## 2015-08-05 DIAGNOSIS — J449 Chronic obstructive pulmonary disease, unspecified: Secondary | ICD-10-CM | POA: Diagnosis not present

## 2015-08-05 DIAGNOSIS — I350 Nonrheumatic aortic (valve) stenosis: Secondary | ICD-10-CM | POA: Diagnosis present

## 2015-08-05 DIAGNOSIS — K56609 Unspecified intestinal obstruction, unspecified as to partial versus complete obstruction: Secondary | ICD-10-CM

## 2015-08-05 DIAGNOSIS — K565 Intestinal adhesions [bands] with obstruction (postprocedural) (postinfection): Secondary | ICD-10-CM | POA: Diagnosis not present

## 2015-08-05 DIAGNOSIS — R1030 Lower abdominal pain, unspecified: Secondary | ICD-10-CM

## 2015-08-05 DIAGNOSIS — I10 Essential (primary) hypertension: Secondary | ICD-10-CM | POA: Diagnosis present

## 2015-08-05 DIAGNOSIS — Z4659 Encounter for fitting and adjustment of other gastrointestinal appliance and device: Secondary | ICD-10-CM

## 2015-08-05 DIAGNOSIS — K913 Postprocedural intestinal obstruction: Secondary | ICD-10-CM | POA: Diagnosis not present

## 2015-08-05 DIAGNOSIS — D62 Acute posthemorrhagic anemia: Secondary | ICD-10-CM | POA: Diagnosis not present

## 2015-08-05 DIAGNOSIS — N179 Acute kidney failure, unspecified: Secondary | ICD-10-CM | POA: Diagnosis not present

## 2015-08-05 DIAGNOSIS — J9811 Atelectasis: Secondary | ICD-10-CM | POA: Diagnosis not present

## 2015-08-05 DIAGNOSIS — E46 Unspecified protein-calorie malnutrition: Secondary | ICD-10-CM | POA: Diagnosis present

## 2015-08-05 DIAGNOSIS — N281 Cyst of kidney, acquired: Secondary | ICD-10-CM | POA: Diagnosis not present

## 2015-08-05 DIAGNOSIS — I2510 Atherosclerotic heart disease of native coronary artery without angina pectoris: Secondary | ICD-10-CM | POA: Diagnosis present

## 2015-08-05 DIAGNOSIS — R109 Unspecified abdominal pain: Secondary | ICD-10-CM | POA: Diagnosis not present

## 2015-08-05 DIAGNOSIS — I5033 Acute on chronic diastolic (congestive) heart failure: Secondary | ICD-10-CM | POA: Diagnosis present

## 2015-08-05 DIAGNOSIS — Z6820 Body mass index (BMI) 20.0-20.9, adult: Secondary | ICD-10-CM

## 2015-08-05 DIAGNOSIS — R339 Retention of urine, unspecified: Secondary | ICD-10-CM | POA: Diagnosis present

## 2015-08-05 DIAGNOSIS — E860 Dehydration: Secondary | ICD-10-CM | POA: Diagnosis present

## 2015-08-05 DIAGNOSIS — Z87891 Personal history of nicotine dependence: Secondary | ICD-10-CM

## 2015-08-05 DIAGNOSIS — Z79899 Other long term (current) drug therapy: Secondary | ICD-10-CM

## 2015-08-05 DIAGNOSIS — N189 Chronic kidney disease, unspecified: Secondary | ICD-10-CM | POA: Diagnosis present

## 2015-08-05 DIAGNOSIS — I5032 Chronic diastolic (congestive) heart failure: Secondary | ICD-10-CM | POA: Diagnosis present

## 2015-08-05 LAB — CBC
HCT: 36.6 % (ref 36.0–46.0)
Hemoglobin: 11.7 g/dL — ABNORMAL LOW (ref 12.0–15.0)
MCH: 30.4 pg (ref 26.0–34.0)
MCHC: 32 g/dL (ref 30.0–36.0)
MCV: 95.1 fL (ref 78.0–100.0)
Platelets: 220 10*3/uL (ref 150–400)
RBC: 3.85 MIL/uL — ABNORMAL LOW (ref 3.87–5.11)
RDW: 15.2 % (ref 11.5–15.5)
WBC: 9.3 10*3/uL (ref 4.0–10.5)

## 2015-08-05 NOTE — ED Notes (Signed)
Pt states she is having abd pain  Pt states it started after dinner tonight  Pt states she at chicken, okra, and cabbage  Pt states her stomach is cramping  Denies nausea or vomiting  Pt states she had a bowel movement after eating but it was normal

## 2015-08-06 ENCOUNTER — Inpatient Hospital Stay (HOSPITAL_COMMUNITY): Payer: Medicare Other

## 2015-08-06 ENCOUNTER — Emergency Department (HOSPITAL_COMMUNITY): Payer: Medicare Other

## 2015-08-06 ENCOUNTER — Encounter (HOSPITAL_COMMUNITY): Payer: Self-pay | Admitting: Radiology

## 2015-08-06 DIAGNOSIS — K56609 Unspecified intestinal obstruction, unspecified as to partial versus complete obstruction: Secondary | ICD-10-CM | POA: Diagnosis present

## 2015-08-06 DIAGNOSIS — I5032 Chronic diastolic (congestive) heart failure: Secondary | ICD-10-CM | POA: Diagnosis not present

## 2015-08-06 DIAGNOSIS — N281 Cyst of kidney, acquired: Secondary | ICD-10-CM | POA: Diagnosis not present

## 2015-08-06 DIAGNOSIS — R54 Age-related physical debility: Secondary | ICD-10-CM | POA: Diagnosis present

## 2015-08-06 DIAGNOSIS — E46 Unspecified protein-calorie malnutrition: Secondary | ICD-10-CM | POA: Diagnosis not present

## 2015-08-06 DIAGNOSIS — I5033 Acute on chronic diastolic (congestive) heart failure: Secondary | ICD-10-CM | POA: Diagnosis present

## 2015-08-06 DIAGNOSIS — I5030 Unspecified diastolic (congestive) heart failure: Secondary | ICD-10-CM | POA: Diagnosis not present

## 2015-08-06 DIAGNOSIS — E860 Dehydration: Secondary | ICD-10-CM | POA: Diagnosis present

## 2015-08-06 DIAGNOSIS — I10 Essential (primary) hypertension: Secondary | ICD-10-CM | POA: Diagnosis not present

## 2015-08-06 DIAGNOSIS — I350 Nonrheumatic aortic (valve) stenosis: Secondary | ICD-10-CM | POA: Diagnosis present

## 2015-08-06 DIAGNOSIS — N179 Acute kidney failure, unspecified: Secondary | ICD-10-CM | POA: Diagnosis present

## 2015-08-06 DIAGNOSIS — J9811 Atelectasis: Secondary | ICD-10-CM | POA: Diagnosis not present

## 2015-08-06 DIAGNOSIS — E43 Unspecified severe protein-calorie malnutrition: Secondary | ICD-10-CM | POA: Diagnosis not present

## 2015-08-06 DIAGNOSIS — K6389 Other specified diseases of intestine: Secondary | ICD-10-CM | POA: Diagnosis not present

## 2015-08-06 DIAGNOSIS — R109 Unspecified abdominal pain: Secondary | ICD-10-CM | POA: Diagnosis not present

## 2015-08-06 DIAGNOSIS — K598 Other specified functional intestinal disorders: Secondary | ICD-10-CM | POA: Diagnosis not present

## 2015-08-06 DIAGNOSIS — I700 Atherosclerosis of aorta: Secondary | ICD-10-CM | POA: Diagnosis not present

## 2015-08-06 DIAGNOSIS — Z79899 Other long term (current) drug therapy: Secondary | ICD-10-CM | POA: Diagnosis not present

## 2015-08-06 DIAGNOSIS — D62 Acute posthemorrhagic anemia: Secondary | ICD-10-CM | POA: Diagnosis not present

## 2015-08-06 DIAGNOSIS — M6281 Muscle weakness (generalized): Secondary | ICD-10-CM | POA: Diagnosis not present

## 2015-08-06 DIAGNOSIS — K5660 Unspecified intestinal obstruction: Secondary | ICD-10-CM | POA: Diagnosis not present

## 2015-08-06 DIAGNOSIS — R140 Abdominal distension (gaseous): Secondary | ICD-10-CM | POA: Diagnosis not present

## 2015-08-06 DIAGNOSIS — K565 Intestinal adhesions [bands] with obstruction (postprocedural) (postinfection): Secondary | ICD-10-CM | POA: Diagnosis present

## 2015-08-06 DIAGNOSIS — K5669 Other intestinal obstruction: Secondary | ICD-10-CM | POA: Diagnosis not present

## 2015-08-06 DIAGNOSIS — K913 Postprocedural intestinal obstruction: Secondary | ICD-10-CM | POA: Diagnosis not present

## 2015-08-06 DIAGNOSIS — R1084 Generalized abdominal pain: Secondary | ICD-10-CM | POA: Diagnosis not present

## 2015-08-06 DIAGNOSIS — S37009S Unspecified injury of unspecified kidney, sequela: Secondary | ICD-10-CM | POA: Diagnosis not present

## 2015-08-06 DIAGNOSIS — J449 Chronic obstructive pulmonary disease, unspecified: Secondary | ICD-10-CM | POA: Diagnosis present

## 2015-08-06 DIAGNOSIS — Z87891 Personal history of nicotine dependence: Secondary | ICD-10-CM | POA: Diagnosis not present

## 2015-08-06 DIAGNOSIS — K567 Ileus, unspecified: Secondary | ICD-10-CM | POA: Diagnosis not present

## 2015-08-06 DIAGNOSIS — N3289 Other specified disorders of bladder: Secondary | ICD-10-CM | POA: Diagnosis not present

## 2015-08-06 DIAGNOSIS — Z4682 Encounter for fitting and adjustment of non-vascular catheter: Secondary | ICD-10-CM | POA: Diagnosis not present

## 2015-08-06 DIAGNOSIS — Z6820 Body mass index (BMI) 20.0-20.9, adult: Secondary | ICD-10-CM | POA: Diagnosis not present

## 2015-08-06 DIAGNOSIS — R339 Retention of urine, unspecified: Secondary | ICD-10-CM | POA: Diagnosis present

## 2015-08-06 DIAGNOSIS — I2510 Atherosclerotic heart disease of native coronary artery without angina pectoris: Secondary | ICD-10-CM | POA: Diagnosis present

## 2015-08-06 LAB — COMPREHENSIVE METABOLIC PANEL
ALT: 15 U/L (ref 14–54)
AST: 27 U/L (ref 15–41)
Albumin: 4 g/dL (ref 3.5–5.0)
Alkaline Phosphatase: 99 U/L (ref 38–126)
Anion gap: 9 (ref 5–15)
BUN: 20 mg/dL (ref 6–20)
CO2: 24 mmol/L (ref 22–32)
Calcium: 10 mg/dL (ref 8.9–10.3)
Chloride: 107 mmol/L (ref 101–111)
Creatinine, Ser: 1.16 mg/dL — ABNORMAL HIGH (ref 0.44–1.00)
GFR calc Af Amer: 46 mL/min — ABNORMAL LOW (ref >60)
GFR calc non Af Amer: 39 mL/min — ABNORMAL LOW (ref >60)
Glucose, Bld: 136 mg/dL — ABNORMAL HIGH (ref 65–99)
Potassium: 4.6 mmol/L (ref 3.5–5.1)
Sodium: 140 mmol/L (ref 135–145)
Total Bilirubin: 0.5 mg/dL (ref 0.3–1.2)
Total Protein: 7.4 g/dL (ref 6.5–8.1)

## 2015-08-06 LAB — URINALYSIS, ROUTINE W REFLEX MICROSCOPIC
Bilirubin Urine: NEGATIVE
Glucose, UA: NEGATIVE mg/dL
Hgb urine dipstick: NEGATIVE
Ketones, ur: NEGATIVE mg/dL
Leukocytes, UA: NEGATIVE
Nitrite: NEGATIVE
Protein, ur: NEGATIVE mg/dL
Specific Gravity, Urine: 1.017 (ref 1.005–1.030)
Urobilinogen, UA: 0.2 mg/dL (ref 0.0–1.0)
pH: 7.5 (ref 5.0–8.0)

## 2015-08-06 LAB — LIPASE, BLOOD: Lipase: 31 U/L (ref 22–51)

## 2015-08-06 MED ORDER — FENTANYL CITRATE (PF) 100 MCG/2ML IJ SOLN
INTRAMUSCULAR | Status: AC
  Filled 2015-08-06: qty 2

## 2015-08-06 MED ORDER — HEPARIN SODIUM (PORCINE) 5000 UNIT/ML IJ SOLN
5000.0000 [IU] | Freq: Three times a day (TID) | INTRAMUSCULAR | Status: DC
  Administered 2015-08-06 – 2015-08-22 (×44): 5000 [IU] via SUBCUTANEOUS
  Filled 2015-08-06 (×50): qty 1

## 2015-08-06 MED ORDER — MORPHINE SULFATE 4 MG/ML IJ SOLN
4.0000 mg | Freq: Once | INTRAMUSCULAR | Status: AC
  Administered 2015-08-06: 4 mg via INTRAVENOUS
  Filled 2015-08-06: qty 1

## 2015-08-06 MED ORDER — SODIUM CHLORIDE 0.9 % IV SOLN
Freq: Once | INTRAVENOUS | Status: AC
  Administered 2015-08-06: 06:00:00 via INTRAVENOUS

## 2015-08-06 MED ORDER — ONDANSETRON HCL 4 MG/2ML IJ SOLN
4.0000 mg | Freq: Once | INTRAMUSCULAR | Status: AC
  Administered 2015-08-06: 4 mg via INTRAVENOUS
  Filled 2015-08-06: qty 2

## 2015-08-06 MED ORDER — TIOTROPIUM BROMIDE MONOHYDRATE 18 MCG IN CAPS
1.0000 | ORAL_CAPSULE | Freq: Every day | RESPIRATORY_TRACT | Status: DC
  Administered 2015-08-14 – 2015-08-20 (×5): 18 ug via RESPIRATORY_TRACT
  Filled 2015-08-06 (×3): qty 5

## 2015-08-06 MED ORDER — MORPHINE SULFATE 2 MG/ML IJ SOLN
1.0000 mg | INTRAMUSCULAR | Status: DC | PRN
  Administered 2015-08-06 – 2015-08-07 (×5): 1 mg via INTRAVENOUS
  Administered 2015-08-08 (×2): 2 mg via INTRAVENOUS
  Administered 2015-08-08: 1 mg via INTRAVENOUS
  Administered 2015-08-09 (×2): 2 mg via INTRAVENOUS
  Administered 2015-08-10: 1 mg via INTRAVENOUS
  Administered 2015-08-10: 2 mg via INTRAVENOUS
  Administered 2015-08-11 (×3): 1 mg via INTRAVENOUS
  Administered 2015-08-11 – 2015-08-14 (×8): 2 mg via INTRAVENOUS
  Administered 2015-08-14: 3 mg via INTRAVENOUS
  Administered 2015-08-15 (×4): 2 mg via INTRAVENOUS
  Administered 2015-08-16: 1 mg via INTRAVENOUS
  Administered 2015-08-16: 2 mg via INTRAVENOUS
  Administered 2015-08-18: 1 mg via INTRAVENOUS
  Administered 2015-08-18 – 2015-08-20 (×5): 2 mg via INTRAVENOUS
  Filled 2015-08-06 (×7): qty 1
  Filled 2015-08-06: qty 2
  Filled 2015-08-06 (×27): qty 1

## 2015-08-06 MED ORDER — FENTANYL CITRATE (PF) 100 MCG/2ML IJ SOLN
50.0000 ug | Freq: Once | INTRAMUSCULAR | Status: AC
  Administered 2015-08-06: 50 ug via INTRAVENOUS

## 2015-08-06 MED ORDER — HYDRALAZINE HCL 20 MG/ML IJ SOLN
5.0000 mg | INTRAMUSCULAR | Status: DC | PRN
  Administered 2015-08-06 – 2015-08-17 (×3): 5 mg via INTRAVENOUS
  Filled 2015-08-06 (×3): qty 1

## 2015-08-06 MED ORDER — MORPHINE SULFATE 2 MG/ML IJ SOLN
INTRAMUSCULAR | Status: AC
  Filled 2015-08-06: qty 1

## 2015-08-06 MED ORDER — SODIUM CHLORIDE 0.9 % IV BOLUS (SEPSIS)
250.0000 mL | Freq: Once | INTRAVENOUS | Status: AC
  Administered 2015-08-06: 250 mL via INTRAVENOUS

## 2015-08-06 MED ORDER — SODIUM CHLORIDE 0.9 % IV SOLN
INTRAVENOUS | Status: DC
  Administered 2015-08-07 – 2015-08-10 (×4): via INTRAVENOUS

## 2015-08-06 MED ORDER — IOHEXOL 300 MG/ML  SOLN
50.0000 mL | Freq: Once | INTRAMUSCULAR | Status: DC | PRN
  Administered 2015-08-06: 50 mL via ORAL
  Filled 2015-08-06: qty 50

## 2015-08-06 MED ORDER — IOHEXOL 300 MG/ML  SOLN
100.0000 mL | Freq: Once | INTRAMUSCULAR | Status: AC | PRN
  Administered 2015-08-06: 80 mL via INTRAVENOUS

## 2015-08-06 NOTE — Consult Note (Signed)
Reason for Consult:  SBO/abdominal pain Referring Physician: Marzetta Board  Desiree Hutchinson is an 81 y.o. female.   HPI: Pt reports abdominal cramping after dinner last PM, BM last PM was normal.  She came to ED early this Am with generalized pain, no nausea or vomiting.  She was afebrile, some BP elevation, no tachycardia.   Labs last PM showed creatinine is up, H/H is up some, and WBC is normal. UA is negative.  Acute films show no significant bowel abnormalities.  CT scan of abdomen shows:  Moderate distension of the stomach. Small bowel loops are increased in caliber and there are multiple fluid levels. The small bowel loops measure up to 4.2 cm. Transition to decreased caliber distal small bowel loops identified in the right abdomen, image 25/series 3. No evidence for perforation or abscess. Normal caliber large bowel loops. Numerous colonic diverticula noted without acute inflammation. Pt just hospitalized 7/23-7/27/16 for diverticulosis of the colon with hemorrhage, anemia with transfusion, constipation, severe malnutrition.  CT during that admit showed diffuse colitis without obstruction or perforation. She was felt to be to malnourished and to fragile for endoscopy at that point. She was sent home on Miralax. We are ask to see.  Past Medical History  Diagnosis Date  . History of atherosclerotic cardiovascular disease   . Hypertension   . Left ventricular hypertrophy   . Gastritis   . Cough   . Personal history of tobacco use, presenting hazards to health   . Obesity   . Chest pain, non-cardiac   . Allergic rhinitis   . COPD (chronic obstructive pulmonary disease)   . History of small bowel obstruction   . Chronic diastolic heart failure   . Aortic valve disease     regurge > stenosis    Past Surgical History  Procedure Laterality Date  . Small bowel obstruction  06/1996  . Esophagogastroduodenoscopy  11/09/1999  . Adenoasine cardiolite  11/09/2004  . Transthoracic  cardiolite   11/03/2006  . Appendectomy    . Hernia repair      History reviewed. No pertinent family history.  Social History:  reports that she quit smoking about 5 years ago. Her smoking use included Cigarettes. She has a 3 pack-year smoking history. She has never used smokeless tobacco. She reports that she does not drink alcohol or use illicit drugs.  Allergies: No Known Allergies  Prior to Admission medications   Medication Sig Start Date End Date Taking? Authorizing Provider  feeding supplement, ENSURE ENLIVE, (ENSURE ENLIVE) LIQD Take 237 mLs by mouth every morning. 07/23/15  Yes Janece Canterbury, MD  ferrous sulfate 325 (65 FE) MG EC tablet Take 1 tablet (325 mg total) by mouth daily with breakfast. 07/23/15  Yes Janece Canterbury, MD  polyethylene glycol powder (GLYCOLAX/MIRALAX) powder Take 17 g by mouth daily. 07/23/15  Yes Janece Canterbury, MD  sodium chloride (OCEAN) 0.65 % SOLN nasal spray Place 1 spray into both nostrils as needed for congestion.   Yes Historical Provider, MD  Tiotropium Bromide Monohydrate (SPIRIVA RESPIMAT) 2.5 MCG/ACT AERS Inhale 2 puffs into the lungs daily. 04/23/15  Yes Elsie Stain, MD  feeding supplement (BOOST / RESOURCE BREEZE) LIQD Take 1 Container by mouth 3 (three) times daily between meals. Patient not taking: Reported on 08/05/2015 07/23/15   Janece Canterbury, MD    Scheduled: . heparin  5,000 Units Subcutaneous 3 times per day   Continuous: . sodium chloride     [id number] Anti-infectives    None  Results for orders placed or performed during the hospital encounter of 08/05/15 (from the past 48 hour(s))  Lipase, blood     Status: None   Collection Time: 08/05/15 11:33 PM  Result Value Ref Range   Lipase 31 22 - 51 U/L  Comprehensive metabolic panel     Status: Abnormal   Collection Time: 08/05/15 11:33 PM  Result Value Ref Range   Sodium 140 135 - 145 mmol/L   Potassium 4.6 3.5 - 5.1 mmol/L   Chloride 107 101 - 111 mmol/L   CO2  24 22 - 32 mmol/L   Glucose, Bld 136 (H) 65 - 99 mg/dL   BUN 20 6 - 20 mg/dL   Creatinine, Ser 1.16 (H) 0.44 - 1.00 mg/dL   Calcium 10.0 8.9 - 10.3 mg/dL   Total Protein 7.4 6.5 - 8.1 g/dL   Albumin 4.0 3.5 - 5.0 g/dL   AST 27 15 - 41 U/L   ALT 15 14 - 54 U/L   Alkaline Phosphatase 99 38 - 126 U/L   Total Bilirubin 0.5 0.3 - 1.2 mg/dL   GFR calc non Af Amer 39 (L) >60 mL/min   GFR calc Af Amer 46 (L) >60 mL/min    Comment: (NOTE) The eGFR has been calculated using the CKD EPI equation. This calculation has not been validated in all clinical situations. eGFR's persistently <60 mL/min signify possible Chronic Kidney Disease.    Anion gap 9 5 - 15  CBC     Status: Abnormal   Collection Time: 08/05/15 11:33 PM  Result Value Ref Range   WBC 9.3 4.0 - 10.5 K/uL   RBC 3.85 (L) 3.87 - 5.11 MIL/uL   Hemoglobin 11.7 (L) 12.0 - 15.0 g/dL   HCT 36.6 36.0 - 46.0 %   MCV 95.1 78.0 - 100.0 fL   MCH 30.4 26.0 - 34.0 pg   MCHC 32.0 30.0 - 36.0 g/dL   RDW 15.2 11.5 - 15.5 %   Platelets 220 150 - 400 K/uL  Urinalysis, Routine w reflex microscopic (not at [facility])     Status: None   Collection Time: 08/06/15  6:42 AM  Result Value Ref Range   Color, Urine YELLOW YELLOW   APPearance CLEAR CLEAR   Specific Gravity, Urine 1.017 1.005 - 1.030   pH 7.5 5.0 - 8.0   Glucose, UA NEGATIVE NEGATIVE mg/dL   Hgb urine dipstick NEGATIVE NEGATIVE   Bilirubin Urine NEGATIVE NEGATIVE   Ketones, ur NEGATIVE NEGATIVE mg/dL   Protein, ur NEGATIVE NEGATIVE mg/dL   Urobilinogen, UA 0.2 0.0 - 1.0 mg/dL   Nitrite NEGATIVE NEGATIVE   Leukocytes, UA NEGATIVE NEGATIVE    Comment: MICROSCOPIC NOT DONE ON URINES WITH NEGATIVE PROTEIN, BLOOD, LEUKOCYTES, NITRITE, OR GLUCOSE <1000 mg/dL.    Ct Abdomen Pelvis W Contrast  08/06/2015   CLINICAL DATA:  Generalized abdominal pain  EXAM: CT ABDOMEN AND PELVIS WITH CONTRAST  TECHNIQUE: Multidetector CT imaging of the abdomen and pelvis was performed using the standard  protocol following bolus administration of intravenous contrast.  CONTRAST:  30m OMNIPAQUE IOHEXOL 300 MG/ML SOLN, 874mOMNIPAQUE IOHEXOL 300 MG/ML SOLN  COMPARISON:  07/20/2015  FINDINGS: Lower chest: No pleural effusion identified. There are coarsened interstitial markings identified within both lung bases. Calcified granuloma identified within the right middle lobe. There is also calcified right hilar node.  Hepatobiliary: No suspicious liver abnormality identified. Stable cyst within the dome of liver measure 7 mm, image 12/series 2. The gallbladder appears normal. No  biliary dilatation.  Pancreas: Normal appearance of the pancreas.  Spleen: The spleen is unremarkable.  Adrenals/Urinary Tract: Normal appearance of the adrenal glands. Bilateral renal cortical volume loss identified. Cyst within the mid left kidney measures 1.2 cm, image 15/series 7. There is moderate distension of the urinary bladder.  Stomach/Bowel: Moderate distension of the stomach. Small bowel loops are increased in caliber and there are multiple fluid levels. The small bowel loops measure up to 4.2 cm, image 52/series 2. Transition to decreased caliber distal small bowel loops identified in the right abdomen, image 25/series 3. No evidence for perforation or abscess. Normal caliber large bowel loops. Numerous colonic diverticula noted without acute inflammation.  Vascular/Lymphatic: Aortic atherosclerosis identified. The infrarenal abdominal aorta measures 2.8 cm, image 33/series 2.  Reproductive: Partially calcified an atrophic uterus noted.  Other: Small amount of perihepatic ascites noted.  Musculoskeletal: The bones are appear diffusely osteopenic. Multi level degenerative disc disease identified.  IMPRESSION: 1. Findings consistent with small bowel obstruction. Transition to relative normal caliber distal small bowel loops noted in the right abdomen. 2. Aortic atherosclerosis. 3. Prior granulomatous disease. 4. Ectatic abdominal aorta.  5.   Electronically Signed   By: Kerby Moors M.D.   On: 08/06/2015 09:42   Dg Abd Acute W/chest  08/06/2015   CLINICAL DATA:  Acute onset of generalized abdominal pain. Initial encounter.  EXAM: DG ABDOMEN ACUTE W/ 1V CHEST  COMPARISON:  CT of the abdomen and pelvis from 07/20/2015, and chest radiograph performed 12/26/2013  FINDINGS: The lungs are well-aerated. Mild bibasilar atelectasis is noted. Mild vascular congestion is seen. There is no evidence of pleural effusion or pneumothorax. The cardiomediastinal silhouette is within normal limits.  The visualized bowel gas pattern is unremarkable. Scattered stool, fluid and air are seen within the colon; there is no evidence of small bowel dilatation to suggest obstruction. No free intra-abdominal air is identified on the provided upright view.  No acute osseous abnormalities are seen; the sacroiliac joints are unremarkable in appearance.  IMPRESSION: 1. Unremarkable bowel gas pattern; no free intra-abdominal air seen. Small amount of stool and fluid noted in the colon. 2. Mild bibasilar atelectasis noted.  Mild vascular congestion seen.   Electronically Signed   By: Garald Balding M.D.   On: 08/06/2015 06:10    Review of Systems  Constitutional: Positive for weight loss (50 pounds over 3 years).  Eyes: Negative.   Respiratory: Positive for shortness of breath (chronic, followed by Pulmonary Dr. Joya Gaskins). Negative for cough, hemoptysis, sputum production and wheezing.   Cardiovascular: Negative.   Gastrointestinal: Positive for abdominal pain and constipation. Negative for nausea, vomiting, diarrhea and blood in stool.       She has not noticed blood in stool since going home.  Musculoskeletal: Negative.   Skin: Negative.   Neurological: Positive for weakness.  Endo/Heme/Allergies: Negative.   Psychiatric/Behavioral: Negative.    Blood pressure 178/60, pulse 88, temperature 97.9 F (36.6 C), temperature source Oral, resp. rate 18, SpO2 98  %. Physical Exam  Constitutional: She is oriented to person, place, and time.  Frail 81 y/o, with NG in place. She continues to have abdominal pain, she has more than 1/2 cannister of clear looking fluid from the NG.    HENT:  Head: Normocephalic and atraumatic.  Nose: Nose normal.  NG in place  Eyes: Conjunctivae and EOM are normal. Right eye exhibits no discharge. Left eye exhibits no discharge. No scleral icterus.  Neck: Normal range of motion. Neck supple. No  JVD present. No tracheal deviation present. No thyromegaly present.  Cardiovascular: Normal rate, regular rhythm and intact distal pulses.   Murmur heard. Respiratory: Effort normal and breath sounds normal. No respiratory distress. She has no wheezes. She has no rales. She exhibits no tenderness.  GI: She exhibits distension. She exhibits no mass. There is tenderness. There is no rebound and no guarding.  Multiple abdominal surgeries in the past with scars midline  Lymphadenopathy:    She has no cervical adenopathy.  Neurological: She is alert and oriented to person, place, and time. No cranial nerve deficit.  Skin: Skin is warm and dry. No rash noted. No erythema. No pallor.  Very dry appears dehydrated.  Psychiatric: She has a normal mood and affect. Her behavior is normal. Judgment and thought content normal.    Assessment/Plan: SBO Hx of multiple abdominal surgeries 1990's Recent hospitalization for colitis and lower GI bleed/transfused COPD/Hx of tobacco use followed by Dr. Joya Gaskins Aortic valve disease/hx of DCHF/LVH Hx of hypertension Hx of significant weight loss Malnutrition/deconditioning   Plan:  Very frail appearing woman with NG in place.  Still having some discomfort.  We will check tube placement, i ask family to talk with floor staff, NG taping looks tenuous at this point.  Agree with NG decompression, hydration and bowel rest.  Watch H/h also. We will follow with you.  Film in AM  also.      Brayli Klingbeil 08/06/2015, 11:11 AM

## 2015-08-06 NOTE — H&P (Signed)
History and Physical   INGE WALDROUP ZOX:[id number] DOB: 08-22-1920 DOA: 08/05/2015  Referring physician: EDP PCP: [name], MD  Specialists: General surgery   Chief Complaint: Abdominal pain  HPI: Desiree Hutchinson is a 81 y.o. female has a past medical history significant for diverticular disease, HTN, LVH, COPD and aortic stenosis who presented to the ED for diffuse, cramping abdominal pain that began last night after dinner. She denies nausea, vomiting, fever, and chills. Pain fluctuates in intensity up to an 8/10. She had a normal bowel movement yesterday, but has not had one since, but did pass flatus today. Does note her stools have been darker than normal since she began taking iron supplements. She also complains of generalized weakness x 2 weeks with no falls. She has a history of 2 previous abdominal surgeries and does not have a history of colon cancer. She was recently hospitalized 7/23-7/27/16 for diverticulosis of the colon with hemorrhage and required a transfusion for anemia. CT at that time demonstrated diffuse colitis without obstruction or perforation. While in the ED, an abdominal CT demonstrated findings consistent with a small bowel obstruction with no abscesses or perforation. An NG tube was placed and general surgery was consulted. Patient was afebrile, not tachycardic and mildly hypertensive. [facility] asked for admission for SBO.  Review of Systems: + weight loss, 24 lbs since 12/2013 and 33 lbs since 03/2013.; Denies nausea, vomiting, diarrhea, constipation, chest pain, shortness of breath, difficulty breathing, fever, and chills.  Past Medical History  Diagnosis Date  . History of atherosclerotic cardiovascular disease   . Hypertension   . Left ventricular hypertrophy   . Gastritis   . Cough   . Personal history of tobacco use, presenting hazards to health   . Obesity   . Chest pain, non-cardiac   . Allergic rhinitis   . COPD (chronic obstructive pulmonary disease)    . History of small bowel obstruction   . Chronic diastolic heart failure   . Aortic valve disease     regurge > stenosis   Past Surgical History  Procedure Laterality Date  . Small bowel obstruction  06/1996  . Esophagogastroduodenoscopy  11/09/1999  . Adenoasine cardiolite  11/09/2004  . Transthoracic cardiolite   11/03/2006  . Appendectomy    . Hernia repair     Social History:  reports that she quit smoking about 5 years ago. Her smoking use included Cigarettes. She has a 3 pack-year smoking history. She has never used smokeless tobacco. She reports that she does not drink alcohol or use illicit drugs.  No Known Allergies Past Medical History: Denies history of colon cancer and previous bowel obstructions  Family History: Son - cancer, but unknown what type; daughter - breast cancer. Does not think there is a family history of colon cancer.   Prior to Admission medications   Medication Sig Start Date End Date Taking? Authorizing Provider  feeding supplement, ENSURE ENLIVE, (ENSURE ENLIVE) LIQD Take 237 mLs by mouth every morning. 07/23/15  Yes Renae Fickle, MD  ferrous sulfate 325 (65 FE) MG EC tablet Take 1 tablet (325 mg total) by mouth daily with breakfast. 07/23/15  Yes Renae Fickle, MD  polyethylene glycol powder (GLYCOLAX/MIRALAX) powder Take 17 g by mouth daily. 07/23/15  Yes Renae Fickle, MD  sodium chloride (OCEAN) 0.65 % SOLN nasal spray Place 1 spray into both nostrils as needed for congestion.   Yes Historical Provider, MD  Tiotropium Bromide Monohydrate (SPIRIVA RESPIMAT) 2.5 MCG/ACT AERS Inhale 2 puffs  into the lungs daily. 04/23/15  Yes Storm Frisk, MD  feeding supplement (BOOST / RESOURCE BREEZE) LIQD Take 1 Container by mouth 3 (three) times daily between meals. Patient not taking: Reported on 08/05/2015 07/23/15   Renae Fickle, MD   Physical Exam: Filed Vitals:   08/06/15 0733 08/06/15 0830 08/06/15 0900 08/06/15 1004  BP: 186/59 151/79 178/60   Pulse:  78 70 61   Temp:    97.9 F (36.6 C)  TempSrc:    Oral  Resp: 18   18  SpO2: 100% 95% 100%       GENERAL: Initially, patient appeared very anxious and was grunting in pain in the ED. Once she was moved from the ED to her inpatient room, she was lying in bed in no acute distress with NG tube in place. She is alert with clear and fluent speech.   HEENT: Head is normocephalic and atraumatic. Pupils are equal, round, and reactive to light and accommodation. Nares appeared normal, NG tube in place. Mucous membranes mildly dry.   NECK: Supple. No lymphadenopathy.  LUNGS: Clear to auscultation. No wheezing or crackles  HEART: Regular rate and rhythm, murmur heard, 2+ pulses, no JVD, no peripheral edema.  ABDOMEN: Soft, distended, diffuse tenderness to palpation. Positive bowel sounds.  EXTREMITIES: Without any cyanosis, clubbing, rash, lesions or edema.  NEUROLOGIC: Grossly intact, able to follow commands appropriately.  PSYCHIATRIC: Normal mood and affect  SKIN: No ulceration or induration present.   Labs on Admission:  Basic Metabolic Panel:  Recent Labs Lab 08/05/15 2333  NA 140  K 4.6  CL 107  CO2 24  GLUCOSE 136*  BUN 20  CREATININE 1.16*  CALCIUM 10.0   Liver Function Tests:  Recent Labs Lab 08/05/15 2333  AST 27  ALT 15  ALKPHOS 99  BILITOT 0.5  PROT 7.4  ALBUMIN 4.0    Recent Labs Lab 08/05/15 2333  LIPASE 31   CBC:  Recent Labs Lab 08/05/15 2333  WBC 9.3  HGB 11.7*  HCT 36.6  MCV 95.1  PLT 220    Radiological Exams on Admission: Ct Abdomen Pelvis W Contrast  08/06/2015   CLINICAL DATA:  Generalized abdominal pain  EXAM: CT ABDOMEN AND PELVIS WITH CONTRAST  TECHNIQUE: Multidetector CT imaging of the abdomen and pelvis was performed using the standard protocol following bolus administration of intravenous contrast.  CONTRAST:  50mL OMNIPAQUE IOHEXOL 300 MG/ML SOLN, 80mL OMNIPAQUE IOHEXOL 300 MG/ML SOLN  COMPARISON:  07/20/2015  FINDINGS:  Lower chest: No pleural effusion identified. There are coarsened interstitial markings identified within both lung bases. Calcified granuloma identified within the right middle lobe. There is also calcified right hilar node.  Hepatobiliary: No suspicious liver abnormality identified. Stable cyst within the dome of liver measure 7 mm, image 12/series 2. The gallbladder appears normal. No biliary dilatation.  Pancreas: Normal appearance of the pancreas.  Spleen: The spleen is unremarkable.  Adrenals/Urinary Tract: Normal appearance of the adrenal glands. Bilateral renal cortical volume loss identified. Cyst within the mid left kidney measures 1.2 cm, image 15/series 7. There is moderate distension of the urinary bladder.  Stomach/Bowel: Moderate distension of the stomach. Small bowel loops are increased in caliber and there are multiple fluid levels. The small bowel loops measure up to 4.2 cm, image 52/series 2. Transition to decreased caliber distal small bowel loops identified in the right abdomen, image 25/series 3. No evidence for perforation or abscess. Normal caliber large bowel loops. Numerous colonic diverticula noted without acute  inflammation.  Vascular/Lymphatic: Aortic atherosclerosis identified. The infrarenal abdominal aorta measures 2.8 cm, image 33/series 2.  Reproductive: Partially calcified an atrophic uterus noted.  Other: Small amount of perihepatic ascites noted.  Musculoskeletal: The bones are appear diffusely osteopenic. Multi level degenerative disc disease identified.  IMPRESSION: 1. Findings consistent with small bowel obstruction. Transition to relative normal caliber distal small bowel loops noted in the right abdomen. 2. Aortic atherosclerosis. 3. Prior granulomatous disease. 4. Ectatic abdominal aorta. 5.   Electronically Signed   By: Signa Kell M.D.   On: 08/06/2015 09:42   Dg Abd Acute W/chest  08/06/2015   CLINICAL DATA:  Acute onset of generalized abdominal pain. Initial  encounter.  EXAM: DG ABDOMEN ACUTE W/ 1V CHEST  COMPARISON:  CT of the abdomen and pelvis from 07/20/2015, and chest radiograph performed 12/26/2013  FINDINGS: The lungs are well-aerated. Mild bibasilar atelectasis is noted. Mild vascular congestion is seen. There is no evidence of pleural effusion or pneumothorax. The cardiomediastinal silhouette is within normal limits.  The visualized bowel gas pattern is unremarkable. Scattered stool, fluid and air are seen within the colon; there is no evidence of small bowel dilatation to suggest obstruction. No free intra-abdominal air is identified on the provided upright view.  No acute osseous abnormalities are seen; the sacroiliac joints are unremarkable in appearance.  IMPRESSION: 1. Unremarkable bowel gas pattern; no free intra-abdominal air seen. Small amount of stool and fluid noted in the colon. 2. Mild bibasilar atelectasis noted.  Mild vascular congestion seen.   Electronically Signed   By: Roanna Raider M.D.   On: 08/06/2015 06:10    EKG: Independently reviewed. Normal sinus rhythm with occasional PVC.  Assessment/Plan Principal Problem:   SBO (small bowel obstruction) Active Problems:   Essential hypertension   Protein-calorie malnutrition   AKI (acute kidney injury)  Small bowel obstruction - CT findings consistent with SBO, patient has generalized cramping abdominal pain with no nausea or vomiting - Consulted surgery - recommend conservative treatment with NG decompression, hydration and bowel rest.  - Abdominal xray in AM  Hypertension - Hypertensive 174/55, - prn IV hydralazine  Acute Kidney Injury - Creatinine 1.16, likely due to dehydration.  - IVF and monitor.   COPD - Currently stable, continue nebs  Protein-Calorie Malnutrition - NPO due to SBO, consider nutrition consult after resolution of SBO  Recent GI bleed - monitor with serial CBCs, no evidence of bleeding currently    Diet: NPO Fluids: IVF DVT Prophylaxis:  Heparin  Code Status: Full Family Communication: Daughter and great-granddaughter at bedside  Disposition Plan: Admit to inpatient  Chanda Busing, PA-S [place] M. Elvera Lennox, MD [facility] Pager [phone]  If 7PM-7AM, please contact night-coverage [email or web address] Password [email or web address] 08/06/2015, 10:52 AM

## 2015-08-06 NOTE — ED Provider Notes (Signed)
CSN: [id number]     Arrival date & time 08/05/15  2235 History   First MD Initiated Contact with Patient 08/06/15 [phone]     Chief Complaint  Patient presents with  . Abdominal Pain     (Consider location/radiation/quality/duration/timing/severity/associated sxs/prior Treatment) HPI Comments: This is a 81 year old lady who has a history of diverticular disease which was diagnosed several months ago.  Tonight was eating chicken."  Currently, when she developed crampy abdominal pain, diffuse in nature.  No nausea, vomiting.  States she had a normal bowel movement yesterday.  Her daughter gave her some mural lacks approximately hour before she arrived, and she has not had a bowel movement as a result of it and no pain relief  Patient is a 81 y.o. female presenting with abdominal pain. The history is provided by the patient and a relative.  Abdominal Pain Pain location:  Generalized Pain quality: aching   Pain severity:  Moderate Onset quality:  Gradual Timing:  Constant Progression:  Unchanged Chronicity:  Recurrent Context: eating   Relieved by:  None tried Worsened by:  Nothing tried Ineffective treatments:  None tried Associated symptoms: nausea   Associated symptoms: no constipation, no fever and no vomiting     Past Medical History  Diagnosis Date  . History of atherosclerotic cardiovascular disease   . Hypertension   . Left ventricular hypertrophy   . Gastritis   . Cough   . Personal history of tobacco use, presenting hazards to health   . Obesity   . Chest pain, non-cardiac   . Allergic rhinitis   . COPD (chronic obstructive pulmonary disease)   . History of small bowel obstruction   . Chronic diastolic heart failure   . Aortic valve disease     regurge > stenosis   Past Surgical History  Procedure Laterality Date  . Small bowel obstruction  06/1996  . Esophagogastroduodenoscopy  11/09/1999  . Adenoasine cardiolite  11/09/2004  . Transthoracic cardiolite   11/03/2006   . Appendectomy    . Hernia repair     History reviewed. No pertinent family history. Social History  Substance Use Topics  . Smoking status: Former Smoker -- 0.10 packs/day for 30 years    Types: Cigarettes    Quit date: 01/24/2010  . Smokeless tobacco: Never Used     Comment: smoked 1-2 cigarettes/day  . Alcohol Use: No   OB History    No data available     Review of Systems  Constitutional: Negative for fever.  Gastrointestinal: Positive for nausea and abdominal pain. Negative for vomiting, constipation and abdominal distention.  All other systems reviewed and are negative.     Allergies  Review of patient's allergies indicates no known allergies.  Home Medications   Prior to Admission medications   Medication Sig Start Date End Date Taking? Authorizing Provider  feeding supplement, ENSURE ENLIVE, (ENSURE ENLIVE) LIQD Take 237 mLs by mouth every morning. 07/23/15  Yes Renae Fickle, MD  ferrous sulfate 325 (65 FE) MG EC tablet Take 1 tablet (325 mg total) by mouth daily with breakfast. 07/23/15  Yes Renae Fickle, MD  polyethylene glycol powder (GLYCOLAX/MIRALAX) powder Take 17 g by mouth daily. 07/23/15  Yes Renae Fickle, MD  sodium chloride (OCEAN) 0.65 % SOLN nasal spray Place 1 spray into both nostrils as needed for congestion.   Yes Historical Provider, MD  Tiotropium Bromide Monohydrate (SPIRIVA RESPIMAT) 2.5 MCG/ACT AERS Inhale 2 puffs into the lungs daily. 04/23/15  Yes Storm Frisk, MD  feeding supplement (BOOST / RESOURCE BREEZE) LIQD Take 1 Container by mouth 3 (three) times daily between meals. Patient not taking: Reported on 08/05/2015 07/23/15   Renae Fickle, MD   BP 188/56 mmHg  Pulse 78  Temp(Src) 97.1 F (36.2 C) (Axillary)  Resp 18  SpO2 98% Physical Exam  Constitutional: She appears well-developed and well-nourished.  HENT:  Mouth/Throat: Oropharynx is clear and moist.  Eyes: Pupils are equal, round, and reactive to light.  Neck: Normal  range of motion.  Cardiovascular: Normal rate and regular rhythm.   Pulmonary/Chest: Effort normal.  Abdominal: Soft. She exhibits no distension. There is tenderness.  Musculoskeletal: Normal range of motion.  Neurological: She is alert.  Skin: Skin is warm. No rash noted. No erythema.  Nursing note and vitals reviewed.   ED Course  Procedures (including critical care time) Labs Review Labs Reviewed  COMPREHENSIVE METABOLIC PANEL - Abnormal; Notable for the following:    Glucose, Bld 136 (*)    Creatinine, Ser 1.16 (*)    GFR calc non Af Amer 39 (*)    GFR calc Af Amer 46 (*)    All other components within normal limits  CBC - Abnormal; Notable for the following:    RBC 3.85 (*)    Hemoglobin 11.7 (*)    All other components within normal limits  LIPASE, BLOOD  URINALYSIS, ROUTINE W REFLEX MICROSCOPIC (NOT AT [facility])    Imaging Review No results found.   EKG Interpretation None     Patient's labs are unremarkable.  I am waiting for acute abdomen to rule out obstruction and a urine MDM   Final diagnoses:  Abdominal pain         Earley Favor, NP 08/06/15 [id number]  Paula Libra, MD 08/06/15 [phone]

## 2015-08-06 NOTE — ED Notes (Addendum)
Delay in transporting pt up to floor d/t Surgery PA at bedside.  Will transport pt gave provider is done.

## 2015-08-06 NOTE — ED Provider Notes (Signed)
  Physical Exam  BP 188/56 mmHg  Pulse 78  Temp(Src) 97.1 F (36.2 C) (Axillary)  Resp 18  SpO2 98%  Physical Exam  ED Course  Procedures Patient was signed out to me by Sharen Hones. KUB and urine pending. Plan is to discharge the patient if no findings.   Recheck: Patient states she is in a lot of pain. I discussed KUB and lab findings with the patient. I also discussed the plan to obtain a CT abdomen. Patient developed crampy abdominal pain after eating chicken. Daughter states she gave her MiraLAX one hour prior to coming to the ED. She denies any nausea or vomiting. Last bowel movement was yesterday morning.  Patient had CT abdomen on 07/20/2015 that showed colitis with possible C. Difficile but no abscess or bowel perferation. CT abdomen on 04/29/15 showed constipation.   Today's CT abdomen shows small bowel obstruction. Patient last ate at 6 PM yesterday. She is not on any anticoagulation medication. I spoke to [employer] with general surgery regarding small bowel obstruction. He recommended admitting her and that he would see her for further evaluation to see if she would be a surgical candidate. Patient's vitals are stable and she is in no acute distress. I spoke to the hospitalist regarding small bowel obstruction and Gen. surgery's plan. She'll be admitted to MedSurg.  Catha Gosselin, PA-C 08/06/15 1030

## 2015-08-06 NOTE — ED Notes (Signed)
Pt can go to floor at 11:40/ 

## 2015-08-06 NOTE — ED Notes (Signed)
Pt c/o generalized abdominal pain x several hours.  Pt states pain is constant.  Pt denies nausea, vomiting, dysuria.  Pt states she had normal bowel movement 1day ago.

## 2015-08-06 NOTE — ED Notes (Signed)
Patient transported to X-ray 

## 2015-08-07 ENCOUNTER — Inpatient Hospital Stay (HOSPITAL_COMMUNITY): Payer: Medicare Other

## 2015-08-07 LAB — CBC
HCT: 37.7 % (ref 36.0–46.0)
Hemoglobin: 12.3 g/dL (ref 12.0–15.0)
MCH: 31.1 pg (ref 26.0–34.0)
MCHC: 32.6 g/dL (ref 30.0–36.0)
MCV: 95.4 fL (ref 78.0–100.0)
Platelets: 228 10*3/uL (ref 150–400)
RBC: 3.95 MIL/uL (ref 3.87–5.11)
RDW: 15.4 % (ref 11.5–15.5)
WBC: 8.1 10*3/uL (ref 4.0–10.5)

## 2015-08-07 LAB — BASIC METABOLIC PANEL
Anion gap: 8 (ref 5–15)
BUN: 17 mg/dL (ref 6–20)
CO2: 24 mmol/L (ref 22–32)
Calcium: 9.4 mg/dL (ref 8.9–10.3)
Chloride: 108 mmol/L (ref 101–111)
Creatinine, Ser: 1.11 mg/dL — ABNORMAL HIGH (ref 0.44–1.00)
GFR calc Af Amer: 48 mL/min — ABNORMAL LOW (ref >60)
GFR calc non Af Amer: 41 mL/min — ABNORMAL LOW (ref >60)
Glucose, Bld: 104 mg/dL — ABNORMAL HIGH (ref 65–99)
Potassium: 4.4 mmol/L (ref 3.5–5.1)
Sodium: 140 mmol/L (ref 135–145)

## 2015-08-07 NOTE — Progress Notes (Signed)
Subjective: She is more animated this AM, more interactive, she thinks she may be better, but distension seems about the same.  I check her NG, not much out.  She says she was nauseated but no vomiting.   Objective: Vital signs in last 24 hours: Temp:  [97.7 F (36.5 C)-99.3 F (37.4 C)] 99.1 F (37.3 C) (08/11 0509) Pulse Rate:  [76-94] 94 (08/11 0509) Resp:  [14-18] 16 (08/11 0509) BP: (153-181)/(55-73) 170/73 mmHg (08/11 0509) SpO2:  [98 %-100 %] 98 % (08/11 0509) Weight:  [41.867 kg (92 lb 4.8 oz)] 41.867 kg (92 lb 4.8 oz) (08/10 1200) Last BM Date: 08/03/15 75 from NG  NPO Emesis 550 recorded Urine 500 recorded TM 99.3, BP up some, Creatinine is better, WBC is also normal  Film pending:  Still very distended. Intake/Output from previous day: 08/10 0701 - 08/11 0700 In: 1188.3 [I.V.:1178.3; NG/GT:10] Out: 1125 [Urine:500; Emesis/NG output:625] Intake/Output this shift: Total I/O In: -  Out: 200 [Urine:200]  General appearance: alert, cooperative and no distress GI: soft, non-tender; bowel sounds normal; no masses,  no organomegaly  Lab Results:   Recent Labs  08/05/15 2333 08/07/15 0845  WBC 9.3 8.1  HGB 11.7* 12.3  HCT 36.6 37.7  PLT 220 228    BMET  Recent Labs  08/05/15 2333 08/07/15 0845  NA 140 140  K 4.6 4.4  CL 107 108  CO2 24 24  GLUCOSE 136* 104*  BUN 20 17  CREATININE 1.16* 1.11*  CALCIUM 10.0 9.4   PT/INR No results for input(s): LABPROT, INR in the last 72 hours.   Recent Labs Lab 08/05/15 2333  AST 27  ALT 15  ALKPHOS 99  BILITOT 0.5  PROT 7.4  ALBUMIN 4.0     Lipase     Component Value Date/Time   LIPASE 31 08/05/2015 2333     Studies/Results: Dg Abd 1 View  08/06/2015   CLINICAL DATA:  NG tube placement.SBO. Abdominal pain.Hx appendectomy. Hx hernia repair.  EXAM: ABDOMEN - 1 VIEW  COMPARISON:  08/06/2015 at 11:15 a.m.  FINDINGS: There is air-filled, mildly dilated bowel throughout the abdomen similar to the  prior study. Nasogastric tube curls within the stomach which is partly decompressed.  IMPRESSION: Persistent mildly dilated air-filled bowel consistent with a partial small bowel obstruction.  Well-positioned nasogastric tube curling within the stomach.   Electronically Signed   By: Amie Portland M.D.   On: 08/06/2015 13:21   Dg Abd 1 View  08/06/2015   CLINICAL DATA:  NG placement  EXAM: ABDOMEN - 1 VIEW  COMPARISON:  CT abdomen 08/06/2015  FINDINGS: NG tube coiled in the body of the stomach. Mild ileus. Negative for bowel obstruction.  IMPRESSION: NG tube in the body the stomach.  Mild ileus.   Electronically Signed   By: Marlan Palau M.D.   On: 08/06/2015 11:26   Ct Abdomen Pelvis W Contrast  08/06/2015   CLINICAL DATA:  Generalized abdominal pain  EXAM: CT ABDOMEN AND PELVIS WITH CONTRAST  TECHNIQUE: Multidetector CT imaging of the abdomen and pelvis was performed using the standard protocol following bolus administration of intravenous contrast.  CONTRAST:  50mL OMNIPAQUE IOHEXOL 300 MG/ML SOLN, 80mL OMNIPAQUE IOHEXOL 300 MG/ML SOLN  COMPARISON:  07/20/2015  FINDINGS: Lower chest: No pleural effusion identified. There are coarsened interstitial markings identified within both lung bases. Calcified granuloma identified within the right middle lobe. There is also calcified right hilar node.  Hepatobiliary: No suspicious liver abnormality identified. Stable cyst  within the dome of liver measure 7 mm, image 12/series 2. The gallbladder appears normal. No biliary dilatation.  Pancreas: Normal appearance of the pancreas.  Spleen: The spleen is unremarkable.  Adrenals/Urinary Tract: Normal appearance of the adrenal glands. Bilateral renal cortical volume loss identified. Cyst within the mid left kidney measures 1.2 cm, image 15/series 7. There is moderate distension of the urinary bladder.  Stomach/Bowel: Moderate distension of the stomach. Small bowel loops are increased in caliber and there are multiple  fluid levels. The small bowel loops measure up to 4.2 cm, image 52/series 2. Transition to decreased caliber distal small bowel loops identified in the right abdomen, image 25/series 3. No evidence for perforation or abscess. Normal caliber large bowel loops. Numerous colonic diverticula noted without acute inflammation.  Vascular/Lymphatic: Aortic atherosclerosis identified. The infrarenal abdominal aorta measures 2.8 cm, image 33/series 2.  Reproductive: Partially calcified an atrophic uterus noted.  Other: Small amount of perihepatic ascites noted.  Musculoskeletal: The bones are appear diffusely osteopenic. Multi level degenerative disc disease identified.  IMPRESSION: 1. Findings consistent with small bowel obstruction. Transition to relative normal caliber distal small bowel loops noted in the right abdomen. 2. Aortic atherosclerosis. 3. Prior granulomatous disease. 4. Ectatic abdominal aorta. 5.   Electronically Signed   By: Signa Kell M.D.   On: 08/06/2015 09:42   Dg Chest Port 1 View  08/06/2015   CLINICAL DATA:  Evaluate nasogastric tube placement.  EXAM: PORTABLE CHEST - 1 VIEW  COMPARISON:  08/06/2015 at 0531 hr  FINDINGS: Nasogastric tube has been placed. The side hole of the tube is in the gastric body region. The tip of the tube is not visualized but likely in the gastric body. There appears to be gaseous distension of the stomach. There is concern for new densities at the left lung base. Heart size is normal. Atherosclerotic calcifications involving the aortic arch.  IMPRESSION: Nasogastric tube in the stomach.  New densities at the left lung base. Findings could represent atelectasis based on the recent development. Recommend follow-up to exclude an area of infection or aspiration.   Electronically Signed   By: Richarda Overlie M.D.   On: 08/06/2015 11:26   Dg Abd Acute W/chest  08/06/2015   CLINICAL DATA:  Acute onset of generalized abdominal pain. Initial encounter.  EXAM: DG ABDOMEN ACUTE W/  1V CHEST  COMPARISON:  CT of the abdomen and pelvis from 07/20/2015, and chest radiograph performed 12/26/2013  FINDINGS: The lungs are well-aerated. Mild bibasilar atelectasis is noted. Mild vascular congestion is seen. There is no evidence of pleural effusion or pneumothorax. The cardiomediastinal silhouette is within normal limits.  The visualized bowel gas pattern is unremarkable. Scattered stool, fluid and air are seen within the colon; there is no evidence of small bowel dilatation to suggest obstruction. No free intra-abdominal air is identified on the provided upright view.  No acute osseous abnormalities are seen; the sacroiliac joints are unremarkable in appearance.  IMPRESSION: 1. Unremarkable bowel gas pattern; no free intra-abdominal air seen. Small amount of stool and fluid noted in the colon. 2. Mild bibasilar atelectasis noted.  Mild vascular congestion seen.   Electronically Signed   By: Roanna Raider M.D.   On: 08/06/2015 06:10    Medications: . heparin  5,000 Units Subcutaneous 3 times per day  . tiotropium  1 capsule Inhalation Daily   . sodium chloride      Assessment/Plan SBO Hx of multiple abdominal surgeries 1990's Recent hospitalization for colitis and lower  GI bleed/transfused COPD/Hx of tobacco use followed by Dr. Delford Field Aortic valve disease/hx of DCHF/LVH Hx of hypertension Mild renal insuffiencey  Hx of significant weight loss Malnutrition/deconditioning Antibiotics:  None DVT: Heparin/SCD added    Plan:  I will wait for the report, continue her NG, NPO.  She needs some fluids and if no improvement I would consider TNA for her sooner than later..  If we have to operated she would be very high risk.  LOS: 1 day    Desiree Hutchinson 08/07/2015

## 2015-08-07 NOTE — Progress Notes (Signed)
Initial Nutrition Assessment  DOCUMENTATION CODES:   Severe malnutrition in context of chronic illness  INTERVENTION:   If pt expected to remain NPO for >7 days, consider nutrition support. Pt severely malnourished.  Diet advancement per MD If diet advanced, recommend Ensure Enlive po TID, each supplement provides 350 kcal and 20 grams of protein RD to continue to monitor  NUTRITION DIAGNOSIS:   Malnutrition related to chronic illness as evidenced by severe depletion of body fat, severe depletion of muscle mass.  GOAL:   Patient will meet greater than or equal to 90% of their needs  MONITOR:   Diet advancement, Labs, Weight trends, Skin, I & O's  REASON FOR ASSESSMENT:   Malnutrition Screening Tool    ASSESSMENT:   81 y.o. female has a past medical history significant for diverticular disease, HTN, LVH, COPD and aortic stenosis who presented to the ED for diffuse, cramping abdominal pain that began last night after dinner.   Pt in room with great-grandson at bedside. Per pt, she drinks Ensure at home, one a day. Encouraged her to drink increase to 2-3 daily. Per weight history, pt has lost 12 lb since 10/02/14 (12% weight loss x 10 months, insignificant for time frame).  Nutrition-Focused physical exam completed. Findings are severe fat depletion, severe muscle depletion, and no edema.   Labs reviewed: Elevated Creatinine  Diet Order:   NPO  Skin:  Reviewed, no issues  Last BM:  8/7  Height:   Ht Readings from Last 1 Encounters:  08/06/15  (1.499 m)    Weight:   Wt Readings from Last 1 Encounters:  08/06/15 92 lb 4.8 oz (41.867 kg)    Ideal Body Weight:  44.1 kg  BMI:  Body mass index is 18.63 kg/(m^2).  Estimated Nutritional Needs:   Kcal:  1250-1450  Protein:  65-75g  Fluid:  1.5L/day  EDUCATION NEEDS:   No education needs identified at this time  Tilda Franco, MS, RD, LDN Pager: [phone] After Hours Pager: [phone]

## 2015-08-07 NOTE — Progress Notes (Signed)
PROGRESS NOTE  Desiree Hutchinson:[id number] DOB: Dec 02, 1920 DOA: 08/05/2015 PCP: [name], MD   HPI: 81 year old patient with diverticular disease, HTN, LVH, COPD and aortic stenosis presented to the ED on 08/06/15 for diffusing, cramping abdominal pain that began on 08/05/15 after eating dinner. She denies nausea, vomiting, fever and chills. Pain fluctuates in intensity up to 8/10. She had a normal bowel movement on 08/05/15. Does note her bowel movements have been darker since taking iron supplements. She also complains of generalized weakness x 2 weeks. She has a history of 2 previous abdominal surgeries and does not have a history of colon cancer. She was recently hospitalized 7/23-7/27/16 for diverticulosis of the colon with hemorrhage and required a transfusion for anemia. Abdominal CT in the ED on 08/06/15 demonstrated findings consistent with a small bowel obstruction with no abscesses or perforation.  Subjective / 24 H Interval events Patient reports her pain has improved, 0/10 at rest and 7/10 with cough and sitting up. Has not had a BM or passed flatus. She felt nauseated early this morning with no vomiting that has subsided.    Assessment/Plan: Principal Problem:   SBO (small bowel obstruction) Active Problems:   Essential hypertension   Protein-calorie malnutrition   AKI (acute kidney injury)   Small bowel obstruction - CT findings consistent with SBO. Patient still feels distended, but notes her abdominal pain has decreased. She has not had a bowel movement or passed flatus since admission. 1 episode of nausea without vomiting, no fever or chills. - management per general surgery - recommending continuing conservative treatment with NG decompression, IV hydration and bowel rest. Consider TNA. - UA negative, lipase WNL - Abdominal xray with persistent SBP pattern  Hypertension - Hypertensive 170/73 - PRN IV hydralazine if SBP >180 since she is NPO - if blood pressure  continues to be high, will add scheduled IV metoprolol  Acute Kidney Injury - Likely due to dehydration. Creatinine trending down to 1.11 from 1.16  - IVF and monitor.   COPD - Currently stable, continue nebs - no wheezing noted  Protein-Calorie Malnutrition - NPO due to SBO, consider nutrition consult after resolution of SBO - will consider TNA if patient remains NPO.  Recent GI bleed - Monitor with serial CBCs, no evidence of bleeding currently, H/H stable today   Diet:  NPO Fluids: IVF DVT Prophylaxis: Heparin  Code Status: Full Code Family Communication: Great-grandson at bedside Disposition Plan: Remain inpatient  Consultants:  General surgery  Procedures:  None  Antibiotics  None   Studies  Dg Abd 1 View  08/06/2015   CLINICAL DATA:  NG tube placement.SBO. Abdominal pain.Hx appendectomy. Hx hernia repair.  EXAM: ABDOMEN - 1 VIEW  COMPARISON:  08/06/2015 at 11:15 a.m.  FINDINGS: There is air-filled, mildly dilated bowel throughout the abdomen similar to the prior study. Nasogastric tube curls within the stomach which is partly decompressed.  IMPRESSION: Persistent mildly dilated air-filled bowel consistent with a partial small bowel obstruction.  Well-positioned nasogastric tube curling within the stomach.   Electronically Signed   By: Amie Portland M.D.   On: 08/06/2015 13:21   Dg Abd 1 View  08/06/2015   CLINICAL DATA:  NG placement  EXAM: ABDOMEN - 1 VIEW  COMPARISON:  CT abdomen 08/06/2015  FINDINGS: NG tube coiled in the body of the stomach. Mild ileus. Negative for bowel obstruction.  IMPRESSION: NG tube in the body the stomach.  Mild ileus.   Electronically Signed   By: Leonette Most  Chestine Spore M.D.   On: 08/06/2015 11:26   Ct Abdomen Pelvis W Contrast  08/06/2015   CLINICAL DATA:  Generalized abdominal pain  EXAM: CT ABDOMEN AND PELVIS WITH CONTRAST  TECHNIQUE: Multidetector CT imaging of the abdomen and pelvis was performed using the standard protocol following  bolus administration of intravenous contrast.  CONTRAST:  50mL OMNIPAQUE IOHEXOL 300 MG/ML SOLN, 80mL OMNIPAQUE IOHEXOL 300 MG/ML SOLN  COMPARISON:  07/20/2015  FINDINGS: Lower chest: No pleural effusion identified. There are coarsened interstitial markings identified within both lung bases. Calcified granuloma identified within the right middle lobe. There is also calcified right hilar node.  Hepatobiliary: No suspicious liver abnormality identified. Stable cyst within the dome of liver measure 7 mm, image 12/series 2. The gallbladder appears normal. No biliary dilatation.  Pancreas: Normal appearance of the pancreas.  Spleen: The spleen is unremarkable.  Adrenals/Urinary Tract: Normal appearance of the adrenal glands. Bilateral renal cortical volume loss identified. Cyst within the mid left kidney measures 1.2 cm, image 15/series 7. There is moderate distension of the urinary bladder.  Stomach/Bowel: Moderate distension of the stomach. Small bowel loops are increased in caliber and there are multiple fluid levels. The small bowel loops measure up to 4.2 cm, image 52/series 2. Transition to decreased caliber distal small bowel loops identified in the right abdomen, image 25/series 3. No evidence for perforation or abscess. Normal caliber large bowel loops. Numerous colonic diverticula noted without acute inflammation.  Vascular/Lymphatic: Aortic atherosclerosis identified. The infrarenal abdominal aorta measures 2.8 cm, image 33/series 2.  Reproductive: Partially calcified an atrophic uterus noted.  Other: Small amount of perihepatic ascites noted.  Musculoskeletal: The bones are appear diffusely osteopenic. Multi level degenerative disc disease identified.  IMPRESSION: 1. Findings consistent with small bowel obstruction. Transition to relative normal caliber distal small bowel loops noted in the right abdomen. 2. Aortic atherosclerosis. 3. Prior granulomatous disease. 4. Ectatic abdominal aorta. 5.    Electronically Signed   By: Signa Kell M.D.   On: 08/06/2015 09:42   Dg Chest Port 1 View  08/06/2015   CLINICAL DATA:  Evaluate nasogastric tube placement.  EXAM: PORTABLE CHEST - 1 VIEW  COMPARISON:  08/06/2015 at 0531 hr  FINDINGS: Nasogastric tube has been placed. The side hole of the tube is in the gastric body region. The tip of the tube is not visualized but likely in the gastric body. There appears to be gaseous distension of the stomach. There is concern for new densities at the left lung base. Heart size is normal. Atherosclerotic calcifications involving the aortic arch.  IMPRESSION: Nasogastric tube in the stomach.  New densities at the left lung base. Findings could represent atelectasis based on the recent development. Recommend follow-up to exclude an area of infection or aspiration.   Electronically Signed   By: Richarda Overlie M.D.   On: 08/06/2015 11:26   Dg Abd Acute W/chest  08/06/2015   CLINICAL DATA:  Acute onset of generalized abdominal pain. Initial encounter.  EXAM: DG ABDOMEN ACUTE W/ 1V CHEST  COMPARISON:  CT of the abdomen and pelvis from 07/20/2015, and chest radiograph performed 12/26/2013  FINDINGS: The lungs are well-aerated. Mild bibasilar atelectasis is noted. Mild vascular congestion is seen. There is no evidence of pleural effusion or pneumothorax. The cardiomediastinal silhouette is within normal limits.  The visualized bowel gas pattern is unremarkable. Scattered stool, fluid and air are seen within the colon; there is no evidence of small bowel dilatation to suggest obstruction. No free intra-abdominal air  is identified on the provided upright view.  No acute osseous abnormalities are seen; the sacroiliac joints are unremarkable in appearance.  IMPRESSION: 1. Unremarkable bowel gas pattern; no free intra-abdominal air seen. Small amount of stool and fluid noted in the colon. 2. Mild bibasilar atelectasis noted.  Mild vascular congestion seen.   Electronically Signed   By:  Roanna Raider M.D.   On: 08/06/2015 06:10    Objective  Filed Vitals:   08/06/15 1431 08/06/15 1522 08/06/15 2155 08/07/15 0509  BP: 180/70 167/62 153/60 170/73  Pulse:   88 94  Temp:   99.3 F (37.4 C) 99.1 F (37.3 C)  TempSrc:   Oral Oral  Resp:   14 16  Height:      Weight:      SpO2:   99% 98%    Intake/Output Summary (Last 24 hours) at 08/07/15 1013 Last data filed at 08/07/15 0800  Gross per 24 hour  Intake 1188.33 ml  Output   1325 ml  Net -136.67 ml   Filed Weights   08/06/15 1200  Weight: 41.867 kg (92 lb 4.8 oz)    Exam:  GENERAL: Patient lying in bed in no acute distress. She is alert with clear and fluent speech.   HEENT: NCAT. Moist mucus membranes. NG tube in place.   NECK: Supple. No lymphadenopathy.   LUNGS: Clear to auscultation. Normal respiratory effort.   HEART: RRR with murmur. No JVD or LE edema. 2+ radial and DP pulses bilaterally.  ABDOMEN: + BS, soft, distended, TTP hypogastric area, RLQ and RUQ. Mild referred tenderness when palpating LLQ to RLQ.   EXTREMITIES: No cyanosis, rash, lesions or edema.   NEUROLOGIC: Alert and oriented x3. Strength 5/5 in all 4 extremities  PSYCHIATRIC: Normal mood and affect  SKIN: No ulceration or induration present. Diffuse dry skin.   Data Reviewed: Basic Metabolic Panel:  Recent Labs Lab 08/05/15 2333 08/07/15 0845  NA 140 140  K 4.6 4.4  CL 107 108  CO2 24 24  GLUCOSE 136* 104*  BUN 20 17  CREATININE 1.16* 1.11*  CALCIUM 10.0 9.4   Liver Function Tests:  Recent Labs Lab 08/05/15 2333  AST 27  ALT 15  ALKPHOS 99  BILITOT 0.5  PROT 7.4  ALBUMIN 4.0    Recent Labs Lab 08/05/15 2333  LIPASE 31   CBC:  Recent Labs Lab 08/05/15 2333 08/07/15 0845  WBC 9.3 8.1  HGB 11.7* 12.3  HCT 36.6 37.7  MCV 95.1 95.4  PLT 220 228    Scheduled Meds: . heparin  5,000 Units Subcutaneous 3 times per day  . tiotropium  1 capsule Inhalation Daily   Continuous Infusions: .  sodium chloride      Chanda Busing, PA-S  Pamella Pert, MD [facility] Pager [phone]. If 7 PM - 7 AM, please contact night-coverage at [email or web address], password [facility] 08/07/2015, 10:13 AM  LOS: 1 day

## 2015-08-08 NOTE — Progress Notes (Signed)
Subjective: She may be a little less distended, still sore, but no pain.  No flatus or BM so far.  Objective: Vital signs in last 24 hours: Temp:  [98.7 F (37.1 C)-99 F (37.2 C)] 98.8 F (37.1 C) (08/12 0545) Pulse Rate:  [78-91] 83 (08/12 0545) Resp:  [14-15] 14 (08/12 0545) BP: (165-170)/(66-74) 166/67 mmHg (08/12 0545) SpO2:  [99 %-100 %] 100 % (08/12 0545) Last BM Date: 08/03/15 75 from the NG still NPO No BM recorded Afebrile, VSS No labs Intake/Output from previous day: 08/11 0701 - 08/12 0700 In: 600 [I.V.:600] Out: 825 [Urine:750; Emesis/NG output:75] Intake/Output this shift:    General appearance: alert, cooperative and no distress GI: still distended, sore, but not tender.  No BS, No flatus, no BM  Lab Results:   Recent Labs  08/05/15 2333 08/07/15 0845  WBC 9.3 8.1  HGB 11.7* 12.3  HCT 36.6 37.7  PLT 220 228    BMET  Recent Labs  08/05/15 2333 08/07/15 0845  NA 140 140  K 4.6 4.4  CL 107 108  CO2 24 24  GLUCOSE 136* 104*  BUN 20 17  CREATININE 1.16* 1.11*  CALCIUM 10.0 9.4   PT/INR No results for input(s): LABPROT, INR in the last 72 hours.   Recent Labs Lab 08/05/15 2333  AST 27  ALT 15  ALKPHOS 99  BILITOT 0.5  PROT 7.4  ALBUMIN 4.0     Lipase     Component Value Date/Time   LIPASE 31 08/05/2015 2333     Studies/Results: Dg Abd 1 View  08/06/2015   CLINICAL DATA:  NG tube placement.SBO. Abdominal pain.Hx appendectomy. Hx hernia repair.  EXAM: ABDOMEN - 1 VIEW  COMPARISON:  08/06/2015 at 11:15 a.m.  FINDINGS: There is air-filled, mildly dilated bowel throughout the abdomen similar to the prior study. Nasogastric tube curls within the stomach which is partly decompressed.  IMPRESSION: Persistent mildly dilated air-filled bowel consistent with a partial small bowel obstruction.  Well-positioned nasogastric tube curling within the stomach.   Electronically Signed   By: Amie Portland M.D.   On: 08/06/2015 13:21   Dg Abd 1  View  08/06/2015   CLINICAL DATA:  NG placement  EXAM: ABDOMEN - 1 VIEW  COMPARISON:  CT abdomen 08/06/2015  FINDINGS: NG tube coiled in the body of the stomach. Mild ileus. Negative for bowel obstruction.  IMPRESSION: NG tube in the body the stomach.  Mild ileus.   Electronically Signed   By: Marlan Palau M.D.   On: 08/06/2015 11:26   Ct Abdomen Pelvis W Contrast  08/06/2015   CLINICAL DATA:  Generalized abdominal pain  EXAM: CT ABDOMEN AND PELVIS WITH CONTRAST  TECHNIQUE: Multidetector CT imaging of the abdomen and pelvis was performed using the standard protocol following bolus administration of intravenous contrast.  CONTRAST:  50mL OMNIPAQUE IOHEXOL 300 MG/ML SOLN, 80mL OMNIPAQUE IOHEXOL 300 MG/ML SOLN  COMPARISON:  07/20/2015  FINDINGS: Lower chest: No pleural effusion identified. There are coarsened interstitial markings identified within both lung bases. Calcified granuloma identified within the right middle lobe. There is also calcified right hilar node.  Hepatobiliary: No suspicious liver abnormality identified. Stable cyst within the dome of liver measure 7 mm, image 12/series 2. The gallbladder appears normal. No biliary dilatation.  Pancreas: Normal appearance of the pancreas.  Spleen: The spleen is unremarkable.  Adrenals/Urinary Tract: Normal appearance of the adrenal glands. Bilateral renal cortical volume loss identified. Cyst within the mid left kidney measures 1.2 cm, image  15/series 7. There is moderate distension of the urinary bladder.  Stomach/Bowel: Moderate distension of the stomach. Small bowel loops are increased in caliber and there are multiple fluid levels. The small bowel loops measure up to 4.2 cm, image 52/series 2. Transition to decreased caliber distal small bowel loops identified in the right abdomen, image 25/series 3. No evidence for perforation or abscess. Normal caliber large bowel loops. Numerous colonic diverticula noted without acute inflammation.  Vascular/Lymphatic:  Aortic atherosclerosis identified. The infrarenal abdominal aorta measures 2.8 cm, image 33/series 2.  Reproductive: Partially calcified an atrophic uterus noted.  Other: Small amount of perihepatic ascites noted.  Musculoskeletal: The bones are appear diffusely osteopenic. Multi level degenerative disc disease identified.  IMPRESSION: 1. Findings consistent with small bowel obstruction. Transition to relative normal caliber distal small bowel loops noted in the right abdomen. 2. Aortic atherosclerosis. 3. Prior granulomatous disease. 4. Ectatic abdominal aorta. 5.   Electronically Signed   By: Signa Kell M.D.   On: 08/06/2015 09:42   Dg Chest Port 1 View  08/06/2015   CLINICAL DATA:  Evaluate nasogastric tube placement.  EXAM: PORTABLE CHEST - 1 VIEW  COMPARISON:  08/06/2015 at 0531 hr  FINDINGS: Nasogastric tube has been placed. The side hole of the tube is in the gastric body region. The tip of the tube is not visualized but likely in the gastric body. There appears to be gaseous distension of the stomach. There is concern for new densities at the left lung base. Heart size is normal. Atherosclerotic calcifications involving the aortic arch.  IMPRESSION: Nasogastric tube in the stomach.  New densities at the left lung base. Findings could represent atelectasis based on the recent development. Recommend follow-up to exclude an area of infection or aspiration.   Electronically Signed   By: Richarda Overlie M.D.   On: 08/06/2015 11:26   Dg Abd 2 Views  08/07/2015   CLINICAL DATA:  Abdominal distention and nausea  EXAM: ABDOMEN - 2 VIEW  COMPARISON:  08/06/2015  FINDINGS: A nasogastric catheter is again noted coiled within the stomach. Scattered large and small bowel gas is noted. Mild persistent dilatation of the small bowel is seen stable from the previous exam. Small amount of colonic air is noted consistent with a partial small bowel obstruction. No free air is seen. No abnormal mass lesion is noted. Contrast  material from prior CT is noted within the colon.  IMPRESSION: Persistent small bowel dilatation consistent with a partial small bowel obstruction. No significant change from the previous day is noted.   Electronically Signed   By: Alcide Clever M.D.   On: 08/07/2015 11:08    Medications: . heparin  5,000 Units Subcutaneous 3 times per day  . tiotropium  1 capsule Inhalation Daily    Assessment/Plan SBO Hx of multiple abdominal surgeries 1990's Recent hospitalization for colitis and lower GI bleed/transfused COPD/Hx of tobacco use followed by Dr. Delford Field Aortic valve disease/hx of DCHF/LVH Hx of hypertension Mild renal insuffiencey  Hx of significant weight loss Malnutrition/deconditioning Antibiotics: None DVT: Heparin/SCD added    Plan:  Try to get her up to chair and ambulate some.  Continue NG and hydration. Check film and labs in AM  LOS: 2 days    Sorayah Schrodt 08/08/2015

## 2015-08-08 NOTE — Progress Notes (Signed)
PROGRESS NOTE  Desiree Hutchinson:[id number] DOB: December 23, 1920 DOA: 08/05/2015 PCP: [name], MD   HPI: 81 year old patient with diverticular disease, HTN, LVH, COPD and aortic stenosis presented to the ED on 08/06/15 for diffusing, cramping abdominal pain that began on 08/05/15 after eating dinner. She denies nausea, vomiting, fever and chills. Pain fluctuates in intensity up to 8/10. She had a normal bowel movement on 08/05/15. Does note her bowel movements have been darker since taking iron supplements. She also complains of generalized weakness x 2 weeks. She has a history of 2 previous abdominal surgeries and does not have a history of colon cancer. She was recently hospitalized 7/23-7/27/16 for diverticulosis of the colon with hemorrhage and required a transfusion for anemia. Abdominal CT in the ED on 08/06/15 demonstrated findings consistent with a small bowel obstruction with no abscesses or perforation.  Subjective / 24 H Interval events Patient reports feeling better today with intermittent abdominal pain that reaches 7/10. Per patient and nurse, she has passed flatus this morning, no bowel movement. She had one episode of nausea this morning that resolved. She was able to walk with assistance and noticed increased abdominal discomfort.  Assessment/Plan: Principal Problem:   SBO (small bowel obstruction) Active Problems:   Essential hypertension   Protein-calorie malnutrition   AKI (acute kidney injury)   Small bowel obstruction - CT findings consistent with SBO. Abdominal pain is decreasing, still distended, passing flatus this morning, no bowel movement - Management per general surgery - Continue conservative treatment with NG decompression, IV hydration and bowel rest. Consider TNA if patient remains TNA. Encourage walking.  - UA negative, lipase WNL  Hypertension - PRN IV hydralazine if SBP >180 since she is NPO - Hypertensive 166/67, asymptomatic, BP is stable, continue to  monitor, if consistent > 180 will add scheduled metop  Acute Kidney Injury - Likely due to dehydration.  - IVF and monitor.  - renal function improving, continue to monitor  COPD - Currently stable, ambulated with assistance and noted some difficulty breathing that resolved once she stopped walked, continue nebs PRN - No wheezing noted  Protein-Calorie Malnutrition - NPO due to SBO, consider nutrition consult after resolution of SBO - Will consider TNA if patient remains NPO.  Recent GI bleed - Monitor with serial CBCs, no evidence of bleeding currently, H/H stable today   Diet:  NPO Fluids: IVF DVT Prophylaxis: Heparin  Code Status: Full Code Family Communication: Daughter at bedside Disposition Plan: Remain Inpatient  Consultants:  General surgery  Procedures:  None  Antibiotics  None    Studies  Dg Abd 1 View  08/06/2015   CLINICAL DATA:  NG tube placement.SBO. Abdominal pain.Hx appendectomy. Hx hernia repair.  EXAM: ABDOMEN - 1 VIEW  COMPARISON:  08/06/2015 at 11:15 a.m.  FINDINGS: There is air-filled, mildly dilated bowel throughout the abdomen similar to the prior study. Nasogastric tube curls within the stomach which is partly decompressed.  IMPRESSION: Persistent mildly dilated air-filled bowel consistent with a partial small bowel obstruction.  Well-positioned nasogastric tube curling within the stomach.   Electronically Signed   By: Amie Portland M.D.   On: 08/06/2015 13:21   Dg Abd 1 View  08/06/2015   CLINICAL DATA:  NG placement  EXAM: ABDOMEN - 1 VIEW  COMPARISON:  CT abdomen 08/06/2015  FINDINGS: NG tube coiled in the body of the stomach. Mild ileus. Negative for bowel obstruction.  IMPRESSION: NG tube in the body the stomach.  Mild ileus.   Electronically  Signed   By: Marlan Palau M.D.   On: 08/06/2015 11:26   Ct Abdomen Pelvis W Contrast  08/06/2015   CLINICAL DATA:  Generalized abdominal pain  EXAM: CT ABDOMEN AND PELVIS WITH CONTRAST  TECHNIQUE:  Multidetector CT imaging of the abdomen and pelvis was performed using the standard protocol following bolus administration of intravenous contrast.  CONTRAST:  50mL OMNIPAQUE IOHEXOL 300 MG/ML SOLN, 80mL OMNIPAQUE IOHEXOL 300 MG/ML SOLN  COMPARISON:  07/20/2015  FINDINGS: Lower chest: No pleural effusion identified. There are coarsened interstitial markings identified within both lung bases. Calcified granuloma identified within the right middle lobe. There is also calcified right hilar node.  Hepatobiliary: No suspicious liver abnormality identified. Stable cyst within the dome of liver measure 7 mm, image 12/series 2. The gallbladder appears normal. No biliary dilatation.  Pancreas: Normal appearance of the pancreas.  Spleen: The spleen is unremarkable.  Adrenals/Urinary Tract: Normal appearance of the adrenal glands. Bilateral renal cortical volume loss identified. Cyst within the mid left kidney measures 1.2 cm, image 15/series 7. There is moderate distension of the urinary bladder.  Stomach/Bowel: Moderate distension of the stomach. Small bowel loops are increased in caliber and there are multiple fluid levels. The small bowel loops measure up to 4.2 cm, image 52/series 2. Transition to decreased caliber distal small bowel loops identified in the right abdomen, image 25/series 3. No evidence for perforation or abscess. Normal caliber large bowel loops. Numerous colonic diverticula noted without acute inflammation.  Vascular/Lymphatic: Aortic atherosclerosis identified. The infrarenal abdominal aorta measures 2.8 cm, image 33/series 2.  Reproductive: Partially calcified an atrophic uterus noted.  Other: Small amount of perihepatic ascites noted.  Musculoskeletal: The bones are appear diffusely osteopenic. Multi level degenerative disc disease identified.  IMPRESSION: 1. Findings consistent with small bowel obstruction. Transition to relative normal caliber distal small bowel loops noted in the right abdomen. 2.  Aortic atherosclerosis. 3. Prior granulomatous disease. 4. Ectatic abdominal aorta. 5.   Electronically Signed   By: Signa Kell M.D.   On: 08/06/2015 09:42   Dg Chest Port 1 View  08/06/2015   CLINICAL DATA:  Evaluate nasogastric tube placement.  EXAM: PORTABLE CHEST - 1 VIEW  COMPARISON:  08/06/2015 at 0531 hr  FINDINGS: Nasogastric tube has been placed. The side hole of the tube is in the gastric body region. The tip of the tube is not visualized but likely in the gastric body. There appears to be gaseous distension of the stomach. There is concern for new densities at the left lung base. Heart size is normal. Atherosclerotic calcifications involving the aortic arch.  IMPRESSION: Nasogastric tube in the stomach.  New densities at the left lung base. Findings could represent atelectasis based on the recent development. Recommend follow-up to exclude an area of infection or aspiration.   Electronically Signed   By: Richarda Overlie M.D.   On: 08/06/2015 11:26   Dg Abd 2 Views  08/07/2015   CLINICAL DATA:  Abdominal distention and nausea  EXAM: ABDOMEN - 2 VIEW  COMPARISON:  08/06/2015  FINDINGS: A nasogastric catheter is again noted coiled within the stomach. Scattered large and small bowel gas is noted. Mild persistent dilatation of the small bowel is seen stable from the previous exam. Small amount of colonic air is noted consistent with a partial small bowel obstruction. No free air is seen. No abnormal mass lesion is noted. Contrast material from prior CT is noted within the colon.  IMPRESSION: Persistent small bowel dilatation consistent with  a partial small bowel obstruction. No significant change from the previous day is noted.   Electronically Signed   By: Alcide Clever M.D.   On: 08/07/2015 11:08    Objective  Filed Vitals:   08/07/15 0509 08/07/15 1434 08/07/15 2146 08/08/15 0545  BP: 170/73 170/66 165/74 166/67  Pulse: 94 78 91 83  Temp: 99.1 F (37.3 C) 99 F (37.2 C) 98.7 F (37.1 C) 98.8  F (37.1 C)  TempSrc: Oral Oral Oral Oral  Resp: 16 15 14 14   Height:      Weight:      SpO2: 98% 99% 99% 100%    Intake/Output Summary (Last 24 hours) at 08/08/15 0905 Last data filed at 08/08/15 0730  Gross per 24 hour  Intake    720 ml  Output    625 ml  Net     95 ml   Filed Weights   08/06/15 1200  Weight: 41.867 kg (92 lb 4.8 oz)   Exam:  GENERAL: Patient is seated in chair in no distress. She is pleasant with clear speech.  HEENT: NCAT. Moist mucus membranes.   LUNGS: Clear to auscultation. No wheezing or crackles  HEART: Regular rate and rhythm. Murmur heard. No peripheral edema  ABDOMEN: BS+, mildly distended with TTP in RUQ and RLQ  EXTREMITIES: Without any rash, lesions or edema.  NEUROLOGIC: Alert and oriented x3  Data Reviewed: Basic Metabolic Panel:  Recent Labs Lab 08/05/15 2333 08/07/15 0845  NA 140 140  K 4.6 4.4  CL 107 108  CO2 24 24  GLUCOSE 136* 104*  BUN 20 17  CREATININE 1.16* 1.11*  CALCIUM 10.0 9.4   Liver Function Tests:  Recent Labs Lab 08/05/15 2333  AST 27  ALT 15  ALKPHOS 99  BILITOT 0.5  PROT 7.4  ALBUMIN 4.0    Recent Labs Lab 08/05/15 2333  LIPASE 31   CBC:  Recent Labs Lab 08/05/15 2333 08/07/15 0845  WBC 9.3 8.1  HGB 11.7* 12.3  HCT 36.6 37.7  MCV 95.1 95.4  PLT 220 228   Scheduled Meds: . heparin  5,000 Units Subcutaneous 3 times per day  . tiotropium  1 capsule Inhalation Daily   Continuous Infusions: . sodium chloride 50 mL/hr at 08/08/15 1610    Chanda Busing, PA-S  Pamella Pert, MD [facility] Pager [phone]. If 7 PM - 7 AM, please contact night-coverage at [email or web address], password [facility] 08/08/2015, 9:05 AM  LOS: 2 days

## 2015-08-08 NOTE — Evaluation (Signed)
Physical Therapy Evaluation Patient Details Name: Desiree Hutchinson MRN: [id number] DOB: 03-Jan-1920 Today's Date: 08/08/2015   History of Present Illness  81 yo female adm with SBO; PMHx:HTN  Clinical Impression  Pt admitted with above diagnosis. Pt currently with functional limitations due to the deficits listed below (see PT Problem List).  Pt will benefit from skilled PT to increase their independence and safety with mobility to allow discharge to the venue listed below.    Will continue to follow for home needs and D/C plan; pt reports she was independent at home but is limited at time of PT eval by weakness and fatigue; Pt with recent, previous hospital adm.      Follow Up Recommendations Home health PT;Supervision for mobility/OOB    Equipment Recommendations  Other (comment) (TBA)    Recommendations for Other Services       Precautions / Restrictions Precautions Precautions: Fall      Mobility  Bed Mobility Overal bed mobility: Needs Assistance Bed Mobility: Supine to Sit     Supine to sit: Min assist;Mod assist     General bed mobility comments: cues for self assist  Transfers Overall transfer level: Needs assistance Equipment used: Rolling walker (2 wheeled) Transfers: Sit to/from Stand Sit to Stand: Min guard;Min assist         General transfer comment:  VCs hand placement,   Ambulation/Gait Ambulation/Gait assistance: Min assist Ambulation Distance (Feet): 140 Feet Assistive device: None;1 person hand held assist   Gait velocity: decreased   General Gait Details: cues for incr step length; 4 standing rests d/t DOE; sats 96% on RA  Stairs            Wheelchair Mobility    Modified Rankin (Stroke Patients Only)       Balance           Standing balance support: During functional activity;Single extremity supported;No upper extremity supported Standing balance-Leahy Scale: Fair                               Pertinent  Vitals/Pain Pain Assessment: No/denies pain    Home Living Family/patient expects to be discharged to:: Private residence   Available Help at Discharge: Family Type of Home: House Home Access: Stairs to enter   Secretary/administrator of Steps: 2 Home Layout: One level Home Equipment: None      Prior Function Level of Independence: Independent               Hand Dominance        Extremity/Trunk Assessment   Upper Extremity Assessment: Generalized weakness           Lower Extremity Assessment: Generalized weakness         Communication   Communication: No difficulties  Cognition Arousal/Alertness: Awake/alert Behavior During Therapy: WFL for tasks assessed/performed Overall Cognitive Status: Within Functional Limits for tasks assessed                      General Comments      Exercises        Assessment/Plan    PT Assessment Patient needs continued PT services  PT Diagnosis Difficulty walking;Generalized weakness   PT Problem List Decreased strength;Decreased activity tolerance;Decreased balance;Decreased mobility;Decreased knowledge of use of DME  PT Treatment Interventions DME instruction;Gait training;Functional mobility training;Therapeutic activities;Therapeutic exercise;Patient/family education;Balance training   PT Goals (Current goals can be found in the Care Plan  section) Acute Rehab PT Goals Patient Stated Goal: home, will go to one of my family member's but not sure which one PT Goal Formulation: With patient/family Time For Goal Achievement: 08/15/15 Potential to Achieve Goals: Good    Frequency Min 3X/week   Barriers to discharge        Co-evaluation               End of Session   Activity Tolerance: Patient limited by fatigue Patient left: in bed;with call bell/phone within reach           Time: 1459-1515 PT Time Calculation (min) (ACUTE ONLY): 16 min   Charges:   PT Evaluation $Initial PT  Evaluation Tier I: 1 Procedure     PT G CodesDrucilla Chalet 2015/08/25, 4:00 PM

## 2015-08-08 NOTE — Care Management Important Message (Signed)
Important Message  Patient Details  Name: Desiree Hutchinson MRN: [id number] Date of Birth: Oct 12, 1920   Medicare Important Message Given:  Yes-second notification given    Haskell Flirt 08/08/2015, 10:38 AMImportant Message  Patient Details  Name: Desiree Hutchinson MRN: [id number] Date of Birth: 07-19-1920   Medicare Important Message Given:  Yes-second notification given    Haskell Flirt 08/08/2015, 10:37 AM

## 2015-08-08 NOTE — Progress Notes (Signed)
Ng tube to low intermittent suction. Minimal output, 25ml out for the night. Patient + flatus this morning.

## 2015-08-09 ENCOUNTER — Inpatient Hospital Stay (HOSPITAL_COMMUNITY): Payer: Medicare Other

## 2015-08-09 LAB — BASIC METABOLIC PANEL
Anion gap: 10 (ref 5–15)
BUN: 27 mg/dL — ABNORMAL HIGH (ref 6–20)
CO2: 19 mmol/L — ABNORMAL LOW (ref 22–32)
Calcium: 9 mg/dL (ref 8.9–10.3)
Chloride: 112 mmol/L — ABNORMAL HIGH (ref 101–111)
Creatinine, Ser: 1.01 mg/dL — ABNORMAL HIGH (ref 0.44–1.00)
GFR calc Af Amer: 54 mL/min — ABNORMAL LOW (ref >60)
GFR calc non Af Amer: 46 mL/min — ABNORMAL LOW (ref >60)
Glucose, Bld: 74 mg/dL (ref 65–99)
Potassium: 4.5 mmol/L (ref 3.5–5.1)
Sodium: 141 mmol/L (ref 135–145)

## 2015-08-09 LAB — PREALBUMIN: Prealbumin: 13.5 mg/dL — ABNORMAL LOW (ref 18–38)

## 2015-08-09 NOTE — Progress Notes (Addendum)
Patient ID: Desiree Hutchinson, female   DOB: 10-17-1920, 81 y.o.   MRN: [id number]    Subjective: Feels okay right now. Required some pain medication a couple of hours ago. Has had some flatus but no bowel movements.  Objective: Vital signs in last 24 hours: Temp:  [98.3 F (36.8 C)-98.6 F (37 C)] 98.4 F (36.9 C) (08/13 0620) Pulse Rate:  [76-82] 76 (08/13 0620) Resp:  [15-16] 16 (08/13 0620) BP: (139-174)/(56-69) 139/56 mmHg (08/13 0620) SpO2:  [98 %-99 %] 98 % (08/13 0620) Last BM Date: 08/03/15  Intake/Output from previous day: 08/12 0701 - 08/13 0700 In: 1920 [I.V.:1800; NG/GT:120] Out: 450 [Urine:100; Emesis/NG output:350] Intake/Output this shift:    General appearance: alert, cooperative and no distress GI: bowel sounds present. Mild distention. Mild to moderate right-sided tenderness without guarding or peritoneal signs.  Lab Results:   Recent Labs  Aug 14, 2015 0845  WBC 8.1  HGB 12.3  HCT 37.7  PLT 228   BMET  Recent Labs  August 14, 2015 0845 08/09/15 0450  NA 140 141  K 4.4 4.5  CL 108 112*  CO2 24 19*  GLUCOSE 104* 74  BUN 17 27*  CREATININE 1.11* 1.01*  CALCIUM 9.4 9.0     Studies/Results: Dg Abd 2 Views  Aug 14, 2015   CLINICAL DATA:  Abdominal distention and nausea  EXAM: ABDOMEN - 2 VIEW  COMPARISON:  08/06/2015  FINDINGS: A nasogastric catheter is again noted coiled within the stomach. Scattered large and small bowel gas is noted. Mild persistent dilatation of the small bowel is seen stable from the previous exam. Small amount of colonic air is noted consistent with a partial small bowel obstruction. No free air is seen. No abnormal mass lesion is noted. Contrast material from prior CT is noted within the colon.  IMPRESSION: Persistent small bowel dilatation consistent with a partial small bowel obstruction. No significant change from the previous day is noted.   Electronically Signed   By: Alcide Clever M.D.   On: August 14, 2015 11:08     Anti-infectives: Anti-infectives    None      Assessment/Plan: Partial small bowel obstruction likely secondary to adhesions. Seems stable on exam but likely not resolved. Abdominal x-rays from today pending. Malnourished. May benefit from TUNA.    LOS: 3 days    Tammey Deeg T 08/09/2015 Abdominal x ray without improvement.  Continue current management today but may need laparotomy if not improvement tomorrow

## 2015-08-09 NOTE — Progress Notes (Signed)
PROGRESS NOTE  Desiree Hutchinson ZOX:[id number] DOB: [birth year] DOA: 08/05/2015 PCP: [name], MD   HPI: 81 year old patient with diverticular disease, HTN, LVH, COPD and aortic stenosis presented to the ED on 08/06/15 for diffusing, cramping abdominal pain that began on 08/05/15 after eating dinner. She denies nausea, vomiting, fever and chills. Pain fluctuates in intensity up to 8/10. She had a normal bowel movement on 08/05/15. Does note her bowel movements have been darker since taking iron supplements. She also complains of generalized weakness x 2 weeks. She has a history of 2 previous abdominal surgeries and does not have a history of colon cancer. She was recently hospitalized 7/23-7/27/16 for diverticulosis of the colon with hemorrhage and required a transfusion for anemia. Abdominal CT in the ED on 08/06/15 demonstrated findings consistent with a small bowel obstruction with no abscesses or perforation.  Subjective / 24 H Interval events Patient reports feeling better today with intermittent abdominal pain that reaches 7/10. Per patient and nurse, she has passed flatus this morning, no bowel movement. She had one episode of nausea this morning that resolved. She was able to walk with assistance and noticed increased abdominal discomfort.  Assessment/Plan: Principal Problem:   SBO (small bowel obstruction) Active Problems:   Essential hypertension   Protein-calorie malnutrition   AKI (acute kidney injury)   Small bowel obstruction - CT findings consistent with SBO.  - general surgery following - she doesn't have much improvement today, no BMs - discussed with Dr. Johna Sheriff this morning, will start TNA  Hypertension - PRN IV hydralazine if SBP >180 since she is NPO - blood pressure better this morning to 139/56, continue to monitor.   Acute Kidney Injury - Likely due to dehydration.  - IVF and monitor.  - renal function improving, continue to monitor with daily BMPs  COPD -  Currently stable, no wheezing  Protein-Calorie Malnutrition - NPO due to SBO, consider nutrition consult after resolution of SBO - prealbumin low at 13.5 - PICC line today, start TNA after placement  Recent GI bleed - Monitor with serial CBCs, no evidence of bleeding currently - repeat CBC tomorrow   Diet:  NPO Fluids: IVF DVT Prophylaxis: Heparin  Code Status: Full Code Family Communication: Daughter at bedside Disposition Plan: Remain Inpatient  Consultants:  General surgery  Procedures:  None  Antibiotics  None    Studies  Dg Abd 1 View  08/09/2015   CLINICAL DATA:  Small bowel obstruction, abdominal distension  EXAM: ABDOMEN - 1 VIEW  COMPARISON:  08/07/2015  FINDINGS: Upper abdomen is excluded from the field of view. Numerous gas-filled dilated loops of small bowel which appears similar to the prior exam. There is no bowel dilatation to suggest obstruction. There is no evidence of pneumoperitoneum, portal venous gas or pneumatosis. There are no pathologic calcifications along the expected course of the ureters.The osseous structures are unremarkable.  IMPRESSION: Persistent numerous gas-filled dilated loops of small bowel similar to the prior exam consistent with small-bowel obstruction.   Electronically Signed   By: Elige Ko   On: 08/09/2015 09:08   Objective  Filed Vitals:   08/08/15 0545 08/08/15 1429 08/08/15 2158 08/09/15 0620  BP: 166/67 174/69 173/66 139/56  Pulse: 83 81 82 76  Temp: 98.8 F (37.1 C) 98.6 F (37 C) 98.3 F (36.8 C) 98.4 F (36.9 C)  TempSrc: Oral Oral Oral Oral  Resp: 14 15 16 16   Height:      Weight:      SpO2:  100% 99% 98% 98%    Intake/Output Summary (Last 24 hours) at 08/09/15 1053 Last data filed at 08/09/15 1610  Gross per 24 hour  Intake   1000 ml  Output    450 ml  Net    550 ml   Filed Weights   08/06/15 1200  Weight: 41.867 kg (92 lb 4.8 oz)   Exam:  GENERAL: NAD  HEENT: NCAT. Moist mucus membranes.    LUNGS: Clear to auscultation. No wheezing or crackles  HEART: Regular rate and rhythm. Murmur heard. No peripheral edema  ABDOMEN: BS+, mildly distended with TTP in RUQ and RLQ  EXTREMITIES: Without any rash, lesions or edema.  NEUROLOGIC: Alert and oriented x3  Data Reviewed: Basic Metabolic Panel:  Recent Labs Lab 08/05/15 2333 08/07/15 0845 08/09/15 0450  NA 140 140 141  K 4.6 4.4 4.5  CL 107 108 112*  CO2 24 24 19*  GLUCOSE 136* 104* 74  BUN 20 17 27*  CREATININE 1.16* 1.11* 1.01*  CALCIUM 10.0 9.4 9.0   Liver Function Tests:  Recent Labs Lab 08/05/15 2333  AST 27  ALT 15  ALKPHOS 99  BILITOT 0.5  PROT 7.4  ALBUMIN 4.0    Recent Labs Lab 08/05/15 2333  LIPASE 31   CBC:  Recent Labs Lab 08/05/15 2333 08/07/15 0845  WBC 9.3 8.1  HGB 11.7* 12.3  HCT 36.6 37.7  MCV 95.1 95.4  PLT 220 228   Scheduled Meds: . heparin  5,000 Units Subcutaneous 3 times per day  . tiotropium  1 capsule Inhalation Daily   Continuous Infusions: . sodium chloride 75 mL/hr at 08/09/15 0810    Desiree Pert, MD [facility] Pager [phone]. If 7 PM - 7 AM, please contact night-coverage at [email or web address], password [facility] 08/09/2015, 10:53 AM  LOS: 3 days

## 2015-08-10 ENCOUNTER — Inpatient Hospital Stay (HOSPITAL_COMMUNITY): Payer: Medicare Other | Admitting: Certified Registered Nurse Anesthetist

## 2015-08-10 ENCOUNTER — Inpatient Hospital Stay (HOSPITAL_COMMUNITY): Payer: Medicare Other

## 2015-08-10 ENCOUNTER — Encounter (HOSPITAL_COMMUNITY)
Admission: EM | Disposition: A | Discharge status: Skilled Nursing Facility | Payer: Self-pay | Source: Home / Self Care | Attending: Internal Medicine

## 2015-08-10 ENCOUNTER — Encounter (HOSPITAL_COMMUNITY): Payer: Self-pay | Admitting: Certified Registered Nurse Anesthetist

## 2015-08-10 HISTORY — PX: LAPAROTOMY: SHX154

## 2015-08-10 HISTORY — PX: BOWEL RESECTION: SHX1257

## 2015-08-10 HISTORY — PX: LYSIS OF ADHESION: SHX5961

## 2015-08-10 LAB — CBC
HCT: 31.1 % — ABNORMAL LOW (ref 36.0–46.0)
Hemoglobin: 10 g/dL — ABNORMAL LOW (ref 12.0–15.0)
MCH: 31 pg (ref 26.0–34.0)
MCHC: 32.2 g/dL (ref 30.0–36.0)
MCV: 96.3 fL (ref 78.0–100.0)
Platelets: 157 10*3/uL (ref 150–400)
RBC: 3.23 MIL/uL — ABNORMAL LOW (ref 3.87–5.11)
RDW: 14.8 % (ref 11.5–15.5)
WBC: 4.5 10*3/uL (ref 4.0–10.5)

## 2015-08-10 LAB — COMPREHENSIVE METABOLIC PANEL
ALT: 11 U/L — ABNORMAL LOW (ref 14–54)
AST: 19 U/L (ref 15–41)
Albumin: 3.1 g/dL — ABNORMAL LOW (ref 3.5–5.0)
Alkaline Phosphatase: 63 U/L (ref 38–126)
Anion gap: 9 (ref 5–15)
BUN: 26 mg/dL — ABNORMAL HIGH (ref 6–20)
CO2: 19 mmol/L — ABNORMAL LOW (ref 22–32)
Calcium: 8.9 mg/dL (ref 8.9–10.3)
Chloride: 113 mmol/L — ABNORMAL HIGH (ref 101–111)
Creatinine, Ser: 0.93 mg/dL (ref 0.44–1.00)
GFR calc Af Amer: 60 mL/min — ABNORMAL LOW (ref >60)
GFR calc non Af Amer: 51 mL/min — ABNORMAL LOW (ref >60)
Glucose, Bld: 65 mg/dL (ref 65–99)
Potassium: 4.2 mmol/L (ref 3.5–5.1)
Sodium: 141 mmol/L (ref 135–145)
Total Bilirubin: 0.8 mg/dL (ref 0.3–1.2)
Total Protein: 5.6 g/dL — ABNORMAL LOW (ref 6.5–8.1)

## 2015-08-10 LAB — SURGICAL PCR SCREEN
MRSA, PCR: NEGATIVE
Staphylococcus aureus: NEGATIVE

## 2015-08-10 SURGERY — EXCISION, SMALL INTESTINE
Anesthesia: General | Site: Abdomen

## 2015-08-10 MED ORDER — DEXAMETHASONE SODIUM PHOSPHATE 10 MG/ML IJ SOLN
INTRAMUSCULAR | Status: AC
  Filled 2015-08-10: qty 1

## 2015-08-10 MED ORDER — ROCURONIUM BROMIDE 100 MG/10ML IV SOLN
INTRAVENOUS | Status: DC | PRN
  Administered 2015-08-10: 35 mg via INTRAVENOUS

## 2015-08-10 MED ORDER — NEOSTIGMINE METHYLSULFATE 10 MG/10ML IV SOLN
INTRAVENOUS | Status: DC | PRN
  Administered 2015-08-10: 5 mg via INTRAVENOUS

## 2015-08-10 MED ORDER — HYDROMORPHONE HCL 1 MG/ML IJ SOLN
INTRAMUSCULAR | Status: AC
  Filled 2015-08-10: qty 1

## 2015-08-10 MED ORDER — SODIUM CHLORIDE 0.9 % IJ SOLN
10.0000 mL | Freq: Two times a day (BID) | INTRAMUSCULAR | Status: DC
  Administered 2015-08-10 – 2015-08-13 (×3): 10 mL
  Administered 2015-08-14: 20 mL

## 2015-08-10 MED ORDER — ONDANSETRON HCL 4 MG/2ML IJ SOLN
4.0000 mg | Freq: Four times a day (QID) | INTRAMUSCULAR | Status: DC | PRN
  Administered 2015-08-14 – 2015-08-15 (×3): 4 mg via INTRAVENOUS
  Filled 2015-08-10 (×3): qty 2

## 2015-08-10 MED ORDER — ROCURONIUM BROMIDE 100 MG/10ML IV SOLN
INTRAVENOUS | Status: AC
  Filled 2015-08-10: qty 1

## 2015-08-10 MED ORDER — PROPOFOL 10 MG/ML IV BOLUS
INTRAVENOUS | Status: DC | PRN
Start: 2015-08-10 — End: 2015-08-10
  Administered 2015-08-10: 80 mg via INTRAVENOUS

## 2015-08-10 MED ORDER — LABETALOL HCL 5 MG/ML IV SOLN
INTRAVENOUS | Status: DC | PRN
  Administered 2015-08-10: 2.5 mg via INTRAVENOUS
  Administered 2015-08-10 (×2): 5 mg via INTRAVENOUS
  Administered 2015-08-10 (×3): 2.5 mg via INTRAVENOUS

## 2015-08-10 MED ORDER — 0.9 % SODIUM CHLORIDE (POUR BTL) OPTIME
TOPICAL | Status: DC | PRN
  Administered 2015-08-10: 2000 mL

## 2015-08-10 MED ORDER — SODIUM CHLORIDE 0.9 % IJ SOLN
10.0000 mL | INTRAMUSCULAR | Status: DC | PRN
  Administered 2015-08-12 – 2015-08-22 (×6): 10 mL
  Filled 2015-08-10 (×6): qty 40

## 2015-08-10 MED ORDER — PROPOFOL 10 MG/ML IV BOLUS
INTRAVENOUS | Status: AC
  Filled 2015-08-10: qty 20

## 2015-08-10 MED ORDER — ETOMIDATE 2 MG/ML IV SOLN
INTRAVENOUS | Status: AC
  Filled 2015-08-10: qty 10

## 2015-08-10 MED ORDER — FENTANYL CITRATE (PF) 100 MCG/2ML IJ SOLN
INTRAMUSCULAR | Status: AC
  Filled 2015-08-10: qty 4

## 2015-08-10 MED ORDER — MEPERIDINE HCL 50 MG/ML IJ SOLN: 6.2500 mg | INTRAMUSCULAR | Status: DC | PRN

## 2015-08-10 MED ORDER — ONDANSETRON HCL 4 MG/2ML IJ SOLN
INTRAMUSCULAR | Status: AC
  Filled 2015-08-10: qty 2

## 2015-08-10 MED ORDER — LACTATED RINGERS IV SOLN
INTRAVENOUS | Status: DC | PRN
  Administered 2015-08-10 (×2): via INTRAVENOUS

## 2015-08-10 MED ORDER — LIDOCAINE HCL (CARDIAC) 20 MG/ML IV SOLN
INTRAVENOUS | Status: DC | PRN
  Administered 2015-08-10: 100 mg via INTRAVENOUS

## 2015-08-10 MED ORDER — GLYCOPYRROLATE 0.2 MG/ML IJ SOLN
INTRAMUSCULAR | Status: DC | PRN
  Administered 2015-08-10: 0.6 mg via INTRAVENOUS

## 2015-08-10 MED ORDER — CHLORHEXIDINE GLUCONATE 0.12 % MT SOLN
15.0000 mL | Freq: Two times a day (BID) | OROMUCOSAL | Status: DC
  Administered 2015-08-10 – 2015-08-22 (×24): 15 mL via OROMUCOSAL
  Filled 2015-08-10 (×23): qty 15

## 2015-08-10 MED ORDER — LABETALOL HCL 5 MG/ML IV SOLN
INTRAVENOUS | Status: AC
  Filled 2015-08-10: qty 4

## 2015-08-10 MED ORDER — MIDAZOLAM HCL 2 MG/2ML IJ SOLN
INTRAMUSCULAR | Status: AC
  Filled 2015-08-10: qty 2

## 2015-08-10 MED ORDER — LIDOCAINE HCL (CARDIAC) 20 MG/ML IV SOLN
INTRAVENOUS | Status: AC
  Filled 2015-08-10: qty 5

## 2015-08-10 MED ORDER — ESMOLOL HCL 10 MG/ML IV SOLN
INTRAVENOUS | Status: DC | PRN
  Administered 2015-08-10: 40 mg via INTRAVENOUS
  Administered 2015-08-10: 20 mg via INTRAVENOUS
  Administered 2015-08-10: 40 mg via INTRAVENOUS

## 2015-08-10 MED ORDER — SODIUM CHLORIDE 0.9 % IV SOLN
INTRAVENOUS | Status: DC
  Administered 2015-08-10: 100 mL/h via INTRAVENOUS
  Administered 2015-08-10: 21:00:00 via INTRAVENOUS

## 2015-08-10 MED ORDER — CETYLPYRIDINIUM CHLORIDE 0.05 % MT LIQD
7.0000 mL | Freq: Two times a day (BID) | OROMUCOSAL | Status: DC
  Administered 2015-08-10 – 2015-08-21 (×16): 7 mL via OROMUCOSAL

## 2015-08-10 MED ORDER — HYDROMORPHONE HCL 1 MG/ML IJ SOLN
0.2500 mg | INTRAMUSCULAR | Status: DC | PRN
  Administered 2015-08-10 (×4): 0.5 mg via INTRAVENOUS

## 2015-08-10 MED ORDER — PHENYLEPHRINE HCL 10 MG/ML IJ SOLN
INTRAMUSCULAR | Status: DC | PRN
  Administered 2015-08-10: 80 ug via INTRAVENOUS

## 2015-08-10 MED ORDER — CEFOXITIN SODIUM 2 G IV SOLR
2.0000 g | INTRAVENOUS | Status: AC
  Administered 2015-08-10: 2 g via INTRAVENOUS

## 2015-08-10 MED ORDER — ESMOLOL HCL 10 MG/ML IV SOLN
INTRAVENOUS | Status: AC
  Filled 2015-08-10: qty 10

## 2015-08-10 MED ORDER — FENTANYL CITRATE (PF) 100 MCG/2ML IJ SOLN
INTRAMUSCULAR | Status: DC | PRN
  Administered 2015-08-10 (×2): 50 ug via INTRAVENOUS
  Administered 2015-08-10: 100 ug via INTRAVENOUS

## 2015-08-10 MED ORDER — SUCCINYLCHOLINE CHLORIDE 20 MG/ML IJ SOLN
INTRAMUSCULAR | Status: DC | PRN
  Administered 2015-08-10: 100 mg via INTRAVENOUS

## 2015-08-10 MED ORDER — DEXTROSE 5 % IV SOLN
INTRAVENOUS | Status: AC
  Filled 2015-08-10: qty 2

## 2015-08-10 MED ORDER — FENTANYL CITRATE (PF) 100 MCG/2ML IJ SOLN
INTRAMUSCULAR | Status: AC
  Filled 2015-08-10: qty 2

## 2015-08-10 MED ORDER — DEXAMETHASONE SODIUM PHOSPHATE 10 MG/ML IJ SOLN
INTRAMUSCULAR | Status: DC | PRN
  Administered 2015-08-10: 10 mg via INTRAVENOUS

## 2015-08-10 MED ORDER — ONDANSETRON HCL 4 MG/2ML IJ SOLN: 4.0000 mg | Freq: Once | INTRAMUSCULAR | Status: DC | PRN

## 2015-08-10 MED ORDER — ONDANSETRON HCL 4 MG/2ML IJ SOLN
INTRAMUSCULAR | Status: DC | PRN
Start: 2015-08-10 — End: 2015-08-10
  Administered 2015-08-10: 4 mg via INTRAVENOUS

## 2015-08-10 MED ORDER — NEOSTIGMINE METHYLSULFATE 10 MG/10ML IV SOLN
INTRAVENOUS | Status: AC
  Filled 2015-08-10: qty 1

## 2015-08-10 MED ORDER — GLYCOPYRROLATE 0.2 MG/ML IJ SOLN
INTRAMUSCULAR | Status: AC
  Filled 2015-08-10: qty 3

## 2015-08-10 SURGICAL SUPPLY — 50 items
APPLICATOR COTTON TIP 6IN STRL (MISCELLANEOUS) ×6 IMPLANT
BLADE EXTENDED COATED 6.5IN (ELECTRODE) IMPLANT
BLADE HEX COATED 2.75 (ELECTRODE) ×3 IMPLANT
BLADE SURG SZ10 CARB STEEL (BLADE) ×3 IMPLANT
CLIP TI LARGE 6 (CLIP) IMPLANT
COVER MAYO STAND STRL (DRAPES) ×3 IMPLANT
COVER SURGICAL LIGHT HANDLE (MISCELLANEOUS) ×3 IMPLANT
DRAPE LAPAROSCOPIC ABDOMINAL (DRAPES) ×3 IMPLANT
DRAPE SHEET LG 3/4 BI-LAMINATE (DRAPES) IMPLANT
DRAPE WARM FLUID 44X44 (DRAPE) ×3 IMPLANT
DRSG OPSITE POSTOP 4X10 (GAUZE/BANDAGES/DRESSINGS) ×1 IMPLANT
ELECT REM PT RETURN 9FT ADLT (ELECTROSURGICAL) ×3
ELECTRODE REM PT RTRN 9FT ADLT (ELECTROSURGICAL) ×2 IMPLANT
GAUZE SPONGE 4X4 12PLY STRL (GAUZE/BANDAGES/DRESSINGS) ×3 IMPLANT
GLOVE BIOGEL PI IND STRL 7.0 (GLOVE) ×2 IMPLANT
GLOVE BIOGEL PI IND STRL 7.5 (GLOVE) ×2 IMPLANT
GLOVE BIOGEL PI INDICATOR 7.0 (GLOVE) ×1
GLOVE BIOGEL PI INDICATOR 7.5 (GLOVE) ×1
GLOVE ECLIPSE 7.5 STRL STRAW (GLOVE) ×6 IMPLANT
GOWN STRL REUS W/TWL LRG LVL3 (GOWN DISPOSABLE) ×3 IMPLANT
GOWN STRL REUS W/TWL XL LVL3 (GOWN DISPOSABLE) ×6 IMPLANT
KIT BASIN OR (CUSTOM PROCEDURE TRAY) ×3 IMPLANT
LEGGING LITHOTOMY PAIR STRL (DRAPES) IMPLANT
LIGASURE IMPACT 36 18CM CVD LR (INSTRUMENTS) IMPLANT
NS IRRIG 1000ML POUR BTL (IV SOLUTION) ×6 IMPLANT
PACK GENERAL/GYN (CUSTOM PROCEDURE TRAY) ×3 IMPLANT
RETAINER VISCERA MED (MISCELLANEOUS) ×1 IMPLANT
SHEARS HARMONIC ACE PLUS 36CM (ENDOMECHANICALS) IMPLANT
STAPLER VISISTAT 35W (STAPLE) ×3 IMPLANT
SUCTION POOLE TIP (SUCTIONS) ×3 IMPLANT
SUT NOV 1 T60/GS (SUTURE) IMPLANT
SUT NOVA 1 T20/GS 25DT (SUTURE) IMPLANT
SUT NOVA NAB DX-16 0-1 5-0 T12 (SUTURE) IMPLANT
SUT NOVA T20/GS 25 (SUTURE) IMPLANT
SUT PDS AB 1 CTX 36 (SUTURE) IMPLANT
SUT PDS AB 1 TP1 96 (SUTURE) IMPLANT
SUT SILK 2 0 (SUTURE) ×3
SUT SILK 2 0 SH CR/8 (SUTURE) ×3 IMPLANT
SUT SILK 2 0SH CR/8 30 (SUTURE) IMPLANT
SUT SILK 2-0 18XBRD TIE 12 (SUTURE) ×2 IMPLANT
SUT SILK 2-0 30XBRD TIE 12 (SUTURE) IMPLANT
SUT SILK 3 0 (SUTURE) ×6
SUT SILK 3 0 SH CR/8 (SUTURE) ×3 IMPLANT
SUT SILK 3-0 18XBRD TIE 12 (SUTURE) ×4 IMPLANT
SUT VIC AB 3-0 54XBRD REEL (SUTURE) IMPLANT
SUT VIC AB 3-0 BRD 54 (SUTURE)
TOWEL OR 17X26 10 PK STRL BLUE (TOWEL DISPOSABLE) ×3 IMPLANT
TRAY FOLEY W/METER SILVER 14FR (SET/KITS/TRAYS/PACK) ×3 IMPLANT
TRAY FOLEY W/METER SILVER 16FR (SET/KITS/TRAYS/PACK) ×3 IMPLANT
YANKAUER SUCT BULB TIP NO VENT (SUCTIONS) ×3 IMPLANT

## 2015-08-10 NOTE — Progress Notes (Signed)
Peripherally Inserted Central Catheter/Midline Placement  The IV Nurse has discussed with the patient and/or persons authorized to consent for the patient, the purpose of this procedure and the potential benefits and risks involved with this procedure.  The benefits include less needle sticks, lab draws from the catheter and patient may be discharged home with the catheter.  Risks include, but not limited to, infection, bleeding, blood clot (thrombus formation), and puncture of an artery; nerve damage and irregular heat beat.  Alternatives to this procedure were also discussed.    Pt signed consent, daughter at bedside also in agreement with procedure.  Both are aware of transport to OR and the PICC is not to be placed at this time, only obtaining consent.  Dr. Johna Sheriff on unit also aware and requests not to place PICC at this time due to going to OR.  All are aware that attempt to place PICC this afternoon or tomorrow is possible.  PICC/Midline Placement Documentation        Desiree Hutchinson 08/10/2015, 9:56 AM

## 2015-08-10 NOTE — Anesthesia Preprocedure Evaluation (Addendum)
Anesthesia Evaluation  Patient identified by MRN, date of birth, ID band Patient awake    Reviewed: Allergy & Precautions, NPO status , Patient's Chart, lab work & pertinent test results  Airway Mallampati: I  TM Distance: >3 FB Neck ROM: Full    Dental   Pulmonary shortness of breath, with exertion and at rest, former smoker,    Pulmonary exam normal       Cardiovascular hypertension, Pt. on medications Normal cardiovascular exam+ Valvular Problems/Murmurs AS + Systolic murmurs Known AS. Pt of Dr Excell Seltzer Last ECHO 3/201 Study Conclusions  - Left ventricle: The cavity size was normal. Wall thickness was increased in a pattern of severe LVH. Systolic function was vigorous. The estimated ejection fraction was in the range of 65% to 70%. Doppler parameters are consistent with abnormal left ventricular relaxation (grade 1 diastolic dysfunction). The E/e&' ratio is between 8-15, suggesting indeterminate LV filling pressure. - Aortic valve: Moderately calcified aortic valves. There is moderate aortic stenosis - peak and mean gradients of 54 and 28 mmHg, respectively. Based on an LVOT diameter of 2.0 cm, the calculated AVA is 1.1 cm2. There was moderate, posteriorly-directed regurgitation. Valve area (VTI): 1.08 cm^2. - Mitral valve: Moderate posterior MAC. Mildly thickened leaflets . - Left atrium: Moderately dilated at 41 ml/m2. - Tricuspid valve: There was mild regurgitation. - Pulmonary arteries: PA peak pressure: 31 mm Hg (S). - Inferior vena cava: The vessel was normal in size. The respirophasic diameter changes were in the normal range (>= 50%), consistent with normal central venous pressure.  Impressions:  - Compared to the prior echo in 2014, the aortic valve may be minimally more stenotic, but remains moderate.    Neuro/Psych    GI/Hepatic   Endo/Other    Renal/GU       Musculoskeletal   Abdominal   Peds  Hematology   Anesthesia Other Findings   Reproductive/Obstetrics                            Anesthesia Physical Anesthesia Plan  ASA: IV  Anesthesia Plan: General   Post-op Pain Management:    Induction: Intravenous, Rapid sequence and Cricoid pressure planned  Airway Management Planned: Oral ETT  Additional Equipment: Arterial line  Intra-op Plan:   Post-operative Plan: Extubation in OR  Informed Consent: I have reviewed the patients History and Physical, chart, labs and discussed the procedure including the risks, benefits and alternatives for the proposed anesthesia with the patient or authorized representative who has indicated his/her understanding and acceptance.     Plan Discussed with: CRNA and Surgeon  Anesthesia Plan Comments:         Anesthesia Quick Evaluation

## 2015-08-10 NOTE — Anesthesia Procedure Notes (Signed)
Procedure Name: Intubation Date/Time: 08/10/2015 10:31 AM Performed by: Orest Dikes Pre-anesthesia Checklist: Patient identified, Emergency Drugs available, Suction available and Patient being monitored Patient Re-evaluated:Patient Re-evaluated prior to inductionOxygen Delivery Method: Circle system utilized Preoxygenation: Pre-oxygenation with 100% oxygen Intubation Type: IV induction, Rapid sequence and Cricoid Pressure applied Laryngoscope Size: Mac and 3 Grade View: Grade I Tube type: Oral Tube size: 7.0 mm Number of attempts: 1 Placement Confirmation: ETT inserted through vocal cords under direct vision,  positive ETCO2 and breath sounds checked- equal and bilateral Secured at: 20 cm Tube secured with: Tape Dental Injury: Teeth and Oropharynx as per pre-operative assessment

## 2015-08-10 NOTE — Progress Notes (Signed)
Patient ID: Desiree Hutchinson, female   DOB: 01/09/1920, 81 y.o.   MRN: [id number]    Subjective: No major complaints. States her abdomen hurts when someone presses but otherwise denies pain. Has not had flatus or bowel movements.  Objective: Vital signs in last 24 hours: Temp:  [97.7 F (36.5 C)-99 F (37.2 C)] 99 F (37.2 C) (08/14 0605) Pulse Rate:  [65-67] 65 (08/14 0605) Resp:  [16] 16 (08/14 0605) BP: (134-149)/(50-56) 134/56 mmHg (08/14 0605) SpO2:  [97 %-100 %] 100 % (08/14 0605) Last BM Date: 08/03/15  Intake/Output from previous day: 08/13 0701 - 08/14 0700 In: 1745.8 [I.V.:1745.8] Out: 1540 [Urine:1250; Emesis/NG output:290] Intake/Output this shift:    General appearance: alert, cooperative and no distress Resp: clear to auscultation bilaterally and no increased work of breathing GI: distended. A few bowel sounds. Mild to moderate diffuse tenderness. No palpable hernias.  Lab Results:   Recent Labs  08/10/15 0428  WBC 4.5  HGB 10.0*  HCT 31.1*  PLT 157   BMET  Recent Labs  08/09/15 0450 08/10/15 0428  NA 141 141  K 4.5 4.2  CL 112* 113*  CO2 19* 19*  GLUCOSE 74 65  BUN 27* 26*  CREATININE 1.01* 0.93  CALCIUM 9.0 8.9     Studies/Results: Dg Abd 1 View  08/09/2015   CLINICAL DATA:  Small bowel obstruction, abdominal distension  EXAM: ABDOMEN - 1 VIEW  COMPARISON:  08/07/2015  FINDINGS: Upper abdomen is excluded from the field of view. Numerous gas-filled dilated loops of small bowel which appears similar to the prior exam. There is no bowel dilatation to suggest obstruction. There is no evidence of pneumoperitoneum, portal venous gas or pneumatosis. There are no pathologic calcifications along the expected course of the ureters.The osseous structures are unremarkable.  IMPRESSION: Persistent numerous gas-filled dilated loops of small bowel similar to the prior exam consistent with small-bowel obstruction.   Electronically Signed   By: Elige Ko   On:  08/09/2015 09:08    Anti-infectives: Anti-infectives    None      Assessment/Plan: Persistent small bowel obstruction. I have reviewed today's abdominal x-rays which show multiple loops of dilated small bowel unchanged from previous exams. There is no evidence of improvement with 4 days of conservative management. I believe she will require laparotomy. This was discussed with the patient and her daughter. We discussed that with her age and comorbidities there is significant risk to the procedure. We discussed risks of anesthetic complications, cardiorespiratory complications, bleeding, infection, intestinal injury. We discussed risk of death. However she is making no progress and I believe that without surgery she is not going to recover. They understand and desire to proceed with surgery.    LOS: 4 days    Desiree Hutchinson T 08/10/2015

## 2015-08-10 NOTE — Anesthesia Postprocedure Evaluation (Signed)
Anesthesia Post Note  Patient: Desiree Hutchinson  Procedure(s) Performed: Procedure(s) (LRB): SMALL BOWEL OBSTRUCTION (N/A) EXPLORATORY LAPAROTOMY LYSIS OF ADHESION  Anesthesia type: general  Patient location: PACU  Post pain: Pain level controlled  Post assessment: Patient's Cardiovascular Status Stable  Last Vitals:  Filed Vitals:   08/10/15 1730  BP:   Pulse: 61  Temp:   Resp: 11    Post vital signs: Reviewed and stable  Level of consciousness: sedated  Complications: No apparent anesthesia complications

## 2015-08-10 NOTE — Transfer of Care (Signed)
Immediate Anesthesia Transfer of Care Note  Patient: Desiree Hutchinson  Procedure(s) Performed: Procedure(s): SMALL BOWEL OBSTRUCTION (N/A) EXPLORATORY LAPAROTOMY LYSIS OF ADHESION  Patient Location: PACU  Anesthesia Type:General  Level of Consciousness:  sedated, patient cooperative and responds to stimulation  Airway & Oxygen Therapy:Patient Spontanous Breathing and Patient connected to face mask oxgen  Post-op Assessment:  Report given to PACU RN and Post -op Vital signs reviewed and stable  Post vital signs:  Reviewed and stable  Last Vitals:  Filed Vitals:   08/10/15 0938  BP: 155/47  Pulse: 63  Temp: 36.5 C  Resp: 16    Complications: No apparent anesthesia complications

## 2015-08-10 NOTE — Progress Notes (Signed)
Peripherally Inserted Central Catheter/Midline Placement  The IV Nurse has discussed with the patient and/or persons authorized to consent for the patient, the purpose of this procedure and the potential benefits and risks involved with this procedure.  The benefits include less needle sticks, lab draws from the catheter and patient may be discharged home with the catheter.  Risks include, but not limited to, infection, bleeding, blood clot (thrombus formation), and puncture of an artery; nerve damage and irregular heat beat.  Alternatives to this procedure were also discussed. Pt instructed again on the procedure.  Pt is lethargic but oriented and able to answer questions.  Verbalizes approval for PICC placement at this time. PICC/Midline Placement Documentation  PICC / Midline Double Lumen 08/10/15 PICC Right Brachial 34 cm 0 cm (Active)  Indication for Insertion or Continuance of Line Administration of hyperosmolar/irritating solutions (i.e. TPN, Vancomycin, etc.);Limited venous access - need for IV therapy >5 days (PICC only) 08/10/2015  6:44 PM  Exposed Catheter (cm) 0 cm 08/10/2015  6:44 PM  Site Assessment Clean;Dry;Intact 08/10/2015  6:44 PM  Lumen #1 Status Flushed;Saline locked;Blood return noted 08/10/2015  6:44 PM  Lumen #2 Status Flushed;Saline locked;Blood return noted 08/10/2015  6:44 PM  Dressing Type Transparent 08/10/2015  6:44 PM  Dressing Status Clean;Dry;Intact;Antimicrobial disc in place 08/10/2015  6:44 PM  Line Care Connections checked and tightened 08/10/2015  6:44 PM  Line Adjustment (NICU/IV Team Only) No 08/10/2015  6:44 PM  Dressing Intervention New dressing 08/10/2015  6:44 PM  Dressing Change Due 08/17/15 08/10/2015  6:44 PM       Desiree Hutchinson 08/10/2015, 6:45 PM

## 2015-08-10 NOTE — Op Note (Signed)
Preoperative Diagnosis: small bowel obstruction secondary to adhesions  Postoprative Diagnosis: Same  Procedure: Procedure(s):  EXPLORATORY LAPAROTOMY LYSIS OF ADHESIONS FOR SMALL BOWEL OBSTRUCTION   Surgeon: Glenna Fellows T   Assistants: Romie Levee  Anesthesia:  General endotracheal anesthesia  Indications: patient is a 81 year old female with a history of previous abdominal surgery and previous laparotomy for bowel obstruction and number of years ago who presents with small bowel obstruction. She has been treated with conservative management for 4 days with nasogastric tube and bowel rest and shows no evidence of improvement. After discussion with the patient and the family today we have elected to proceed with laparotomy and lysis of adhesions for persistent small bowel obstruction. The procedure and risks have been discussed extensively and detailed elsewhere.  Procedure Detail:  Patient was brought to the operating room, placed in the supine position on the operating table, and general endotracheal anesthesia induced. Foley catheter was placed. She received preoperative IV antibiotics. PAS were in place. The abdomen was widely sterilely prepped and draped. Patient timeout was performed and correct procedure verified. The previous midline incision skirting the umbilicus was used and dissection was carried down through the subcutaneous tissue and midline fascia using cautery. Dissection was carefully carried down to the peritoneum and fairly dense small bowel adhesions to the midline incision were encountered. Previous suture material was removed. Careful sharp dissection was used to further open the incision and tediously dissect small bowel adhesions away from the midline incision out in all directions. This dissection proceeded adequately and toward the pelvis we entered the free abdominal cavity with no pelvic adhesions. There were fairly dense adhesions laterally on either side of  the incision that were completely freed out in both directions. As I dissected out in the right mid abdomen I encountered a dense bandlike adhesion extending from the abdominal wall down to the mesentery which had trapped a loop of small intestine. This was divided and we brought up a clearly obstructed loop with a band impression across it and completely decompressed distal ileum. There was no evidence of bowel injury. This clearly was the point of obstruction. The bowel proximal to this was all quite distended. We had lysed adhesions up to the upper abdomen and freed all anterior abdominal wall adhesions but there were still some interloop adhesions proximally and adhesions up in the left upper quadrant but I did not feel that it would be beneficial to take these down as we would risk injury and we clearly had the point of obstruction relieved. There were fairly dense adhesions up over the stomach and left upper quadrant that I did not take down. The abdomen was thoroughly irrigated and the bowel inspected. There was a thinned area in the proximal small bowel that had been taken down from the midline incision that was reinforced with several 3-0 silk sutures. The bowel was returned to its anatomic position. The midline fascia was closed with looped running #1 PDS begun at either end of the incision and tied centrally with several interrupted reinforcing #1 Novafil sutures as well. Septated tissue was irrigated and skin closed with staples. The patient was stable and tolerated the procedure well. Sponge needle and instrument counts were correct.    Findings: As above  Estimated Blood Loss:  Minimal         Drains: none  Blood Given: none          Specimens: none        Complications:  * No complications  entered in OR log *         Disposition: PACU - hemodynamically stable.         Condition: stable

## 2015-08-10 NOTE — Progress Notes (Signed)
PROGRESS NOTE  Desiree Hutchinson ZOX:[id number] DOB: Apr 20, 1920 DOA: 08/05/2015 PCP: [name], MD   HPI: 81 year old patient with diverticular disease, HTN, LVH, COPD and aortic stenosis presented to the ED on 08/06/15 for diffusing, cramping abdominal pain that began on 08/05/15 after eating dinner. She denies nausea, vomiting, fever and chills. Pain fluctuates in intensity up to 8/10. She had a normal bowel movement on 08/05/15. Does note her bowel movements have been darker since taking iron supplements. She also complains of generalized weakness x 2 weeks. She has a history of 2 previous abdominal surgeries and does not have a history of colon cancer. She was recently hospitalized 7/23-7/27/16 for diverticulosis of the colon with hemorrhage and required a transfusion for anemia. Abdominal CT in the ED on 08/06/15 demonstrated findings consistent with a small bowel obstruction with no abscesses or perforation.  Subjective / 24 H Interval events - doesn't feel any improvement. - she is passing minimal gas and no BMs - walked three times yesterday - unfortunately she did not get her PICC Line, I am not sure why  Assessment/Plan: Principal Problem:   SBO (small bowel obstruction) Active Problems:   Essential hypertension   Protein-calorie malnutrition   AKI (acute kidney injury)   Small bowel obstruction - CT findings consistent with SBO.  - general surgery following - her abdomen appears more tender today - no significant improvement - PICC line placement today, start TPN afterwards  Hypertension - PRN IV hydralazine if SBP >180 since she is NPO - stable  Acute Kidney Injury - Likely due to dehydration.  - stable, monitor  COPD - Currently stable, no wheezing  Protein-Calorie Malnutrition - TPN to start as above  Recent GI bleed - Monitor with serial CBCs, no evidence of bleeding currently, mild drop in Hb likely dilutional   Diet:  NPO Fluids: IVF DVT Prophylaxis:  Heparin  Code Status: Full Code Family Communication: Daughter at bedside Disposition Plan: Remain Inpatient  Consultants:  General surgery  Procedures:  None  Antibiotics  None    Studies  Dg Abd 1 View  08/09/2015   CLINICAL DATA:  Small bowel obstruction, abdominal distension  EXAM: ABDOMEN - 1 VIEW  COMPARISON:  08/07/2015  FINDINGS: Upper abdomen is excluded from the field of view. Numerous gas-filled dilated loops of small bowel which appears similar to the prior exam. There is no bowel dilatation to suggest obstruction. There is no evidence of pneumoperitoneum, portal venous gas or pneumatosis. There are no pathologic calcifications along the expected course of the ureters.The osseous structures are unremarkable.  IMPRESSION: Persistent numerous gas-filled dilated loops of small bowel similar to the prior exam consistent with small-bowel obstruction.   Electronically Signed   By: Elige Ko   On: 08/09/2015 09:08   Objective  Filed Vitals:   08/08/15 2158 08/09/15 0620 08/09/15 2153 08/10/15 0605  BP: 173/66 139/56 149/50 134/56  Pulse: 82 76 67 65  Temp: 98.3 F (36.8 C) 98.4 F (36.9 C) 97.7 F (36.5 C) 99 F (37.2 C)  TempSrc: Oral Oral Oral Oral  Resp: 16 16 16 16   Height:      Weight:      SpO2: 98% 98% 97% 100%    Intake/Output Summary (Last 24 hours) at 08/10/15 0745 Last data filed at 08/10/15 0600  Gross per 24 hour  Intake 1745.83 ml  Output   1540 ml  Net 205.83 ml   Filed Weights   08/06/15 1200  Weight: 41.867 kg (92  lb 4.8 oz)   Exam:  GENERAL: NAD  HEENT: NCAT. Moist mucus membranes.   LUNGS: Clear to auscultation. No wheezing or crackles  HEART: Regular rate and rhythm. Murmur heard. No peripheral edema  ABDOMEN: BS+, mildly distended, more tender today  NEUROLOGIC: Alert and oriented x3  Data Reviewed: Basic Metabolic Panel:  Recent Labs Lab 08/05/15 2333 08/07/15 0845 08/09/15 0450 08/10/15 0428  NA 140 140 141  141  K 4.6 4.4 4.5 4.2  CL 107 108 112* 113*  CO2 24 24 19* 19*  GLUCOSE 136* 104* 74 65  BUN 20 17 27* 26*  CREATININE 1.16* 1.11* 1.01* 0.93  CALCIUM 10.0 9.4 9.0 8.9   Liver Function Tests:  Recent Labs Lab 08/05/15 2333 08/10/15 0428  AST 27 19  ALT 15 11*  ALKPHOS 99 63  BILITOT 0.5 0.8  PROT 7.4 5.6*  ALBUMIN 4.0 3.1*    Recent Labs Lab 08/05/15 2333  LIPASE 31   CBC:  Recent Labs Lab 08/05/15 2333 08/07/15 0845 08/10/15 0428  WBC 9.3 8.1 4.5  HGB 11.7* 12.3 10.0*  HCT 36.6 37.7 31.1*  MCV 95.1 95.4 96.3  PLT 220 228 157   Scheduled Meds: . antiseptic oral rinse  7 mL Mouth Rinse q12n4p  . chlorhexidine  15 mL Mouth Rinse BID  . heparin  5,000 Units Subcutaneous 3 times per day  . tiotropium  1 capsule Inhalation Daily   Continuous Infusions: . sodium chloride 75 mL/hr at 08/10/15 4098    Pamella Pert, MD [facility] Pager [phone]. If 7 PM - 7 AM, please contact night-coverage at [email or web address], password [email or web address] 08/10/2015, 7:45 AM  LOS: 4 days

## 2015-08-11 ENCOUNTER — Encounter (HOSPITAL_COMMUNITY): Payer: Self-pay | Admitting: General Surgery

## 2015-08-11 LAB — CBC
HCT: 30.6 % — ABNORMAL LOW (ref 36.0–46.0)
Hemoglobin: 9.8 g/dL — ABNORMAL LOW (ref 12.0–15.0)
MCH: 30.1 pg (ref 26.0–34.0)
MCHC: 32 g/dL (ref 30.0–36.0)
MCV: 93.9 fL (ref 78.0–100.0)
Platelets: 151 10*3/uL (ref 150–400)
RBC: 3.26 MIL/uL — ABNORMAL LOW (ref 3.87–5.11)
RDW: 14.8 % (ref 11.5–15.5)
WBC: 9 10*3/uL (ref 4.0–10.5)

## 2015-08-11 LAB — BASIC METABOLIC PANEL
Anion gap: 9 (ref 5–15)
BUN: 25 mg/dL — ABNORMAL HIGH (ref 6–20)
CO2: 20 mmol/L — ABNORMAL LOW (ref 22–32)
Calcium: 8.7 mg/dL — ABNORMAL LOW (ref 8.9–10.3)
Chloride: 115 mmol/L — ABNORMAL HIGH (ref 101–111)
Creatinine, Ser: 1.03 mg/dL — ABNORMAL HIGH (ref 0.44–1.00)
GFR calc Af Amer: 53 mL/min — ABNORMAL LOW (ref >60)
GFR calc non Af Amer: 45 mL/min — ABNORMAL LOW (ref >60)
Glucose, Bld: 104 mg/dL — ABNORMAL HIGH (ref 65–99)
Potassium: 4.5 mmol/L (ref 3.5–5.1)
Sodium: 144 mmol/L (ref 135–145)

## 2015-08-11 LAB — GLUCOSE, CAPILLARY
Glucose-Capillary: 138 mg/dL — ABNORMAL HIGH (ref 65–99)
Glucose-Capillary: 85 mg/dL (ref 65–99)
Glucose-Capillary: 92 mg/dL (ref 65–99)

## 2015-08-11 LAB — PHOSPHORUS: Phosphorus: 3.2 mg/dL (ref 2.5–4.6)

## 2015-08-11 LAB — MAGNESIUM: Magnesium: 1.9 mg/dL (ref 1.7–2.4)

## 2015-08-11 MED ORDER — TRACE MINERALS CR-CU-MN-SE-ZN 10-1000-500-60 MCG/ML IV SOLN
INTRAVENOUS | Status: AC
  Administered 2015-08-11: 18:00:00 via INTRAVENOUS
  Filled 2015-08-11: qty 960

## 2015-08-11 MED ORDER — INSULIN ASPART 100 UNIT/ML ~~LOC~~ SOLN
0.0000 [IU] | Freq: Four times a day (QID) | SUBCUTANEOUS | Status: DC
  Administered 2015-08-12 (×3): 1 [IU] via SUBCUTANEOUS

## 2015-08-11 MED ORDER — SODIUM CHLORIDE 0.9 % IV SOLN
INTRAVENOUS | Status: DC
  Administered 2015-08-11 – 2015-08-12 (×2): via INTRAVENOUS

## 2015-08-11 MED ORDER — FAT EMULSION 20 % IV EMUL
240.0000 mL | INTRAVENOUS | Status: AC
  Administered 2015-08-11: 240 mL via INTRAVENOUS
  Filled 2015-08-11: qty 250

## 2015-08-11 NOTE — Progress Notes (Signed)
Nutrition Follow-up  DOCUMENTATION CODES:   Severe malnutrition in context of chronic illness  INTERVENTION:  - Continue TPN per pharmacy - RD will continue to monitor for needs  NUTRITION DIAGNOSIS:   Malnutrition related to chronic illness as evidenced by severe depletion of body fat, severe depletion of muscle mass. -ongoing  GOAL:   Patient will meet greater than or equal to 90% of their needs -unmet  MONITOR:   Diet advancement, Labs, Weight trends, Skin, I & O's  REASON FOR ASSESSMENT:   Consult New TPN/TNA  ASSESSMENT:   81 y.o. female has a past medical history significant for diverticular disease, HTN, LVH, COPD and aortic stenosis who presented to the ED for diffuse, cramping abdominal pain that began last night after dinner.   8/15 New consult received for new TPN. Pt POD 1 ex lap with lysis of adhesions. Pt transferred from ICU today. She has been NPO since admission and unable to meet needs. Needs calculated during initially assessment remain appropriate although higher ranges of kcal and protein should be achieved, if possible due to recent surgery.   Current order in place for Clinimix E 5/15 @ 40 mL/hr with 20% lipids @ 10 mL/hr which provides 48 grams protein, 1162 kcal which does not meet needs. Plan for goal: Clinimix E 5/15 @ 60 mL/hr with 20% lipids @ 10 mL/hr to provide 72 grams protein, 1502 kcal.   Plan per pharmacy today: At 1800 today:  Start Clinimix E 5/15 at 40 ml/hr.  20% fat emulsion at 10 ml/hr.  Plan to advance as tolerated to the goal rate.  TPN to contain standard multivitamins and trace elements.  Reduce IVF to 60 ml/hr.  Add Novolog sensitive scale q6hr  TPN lab panels on Mondays & Thursdays.  Not currently meeting needs. Medications reviewed. Labs reviewed; Cl: 115 mmol/L, BUN/creatinine elevated, Ca: 8.7 mg/dL, GFR: 53.    1/61 - Per pt, she drinks Ensure at home, one a day. Encouraged her to drink increase to 2-3  daily. - Per weight history, pt has lost 12 lb since 10/02/14 (12% weight loss x 10 months, insignificant for time frame).   Diet Order:  TPN (CLINIMIX-E) Adult  Skin:  Reviewed, no issues  Last BM:  8/7  Height:   Ht Readings from Last 1 Encounters:  08/11/15  (1.499 m)    Weight:   Wt Readings from Last 1 Encounters:  08/11/15 108 lb 11 oz (49.3 kg)    Ideal Body Weight:  44.1 kg  BMI:  Body mass index is 21.94 kg/(m^2).  Estimated Nutritional Needs:   Kcal:  1250-1450  Protein:  65-75g  Fluid:  1.5L/day  EDUCATION NEEDS:   No education needs identified at this time     Trenton Gammon, RD, LDN Inpatient Clinical Dietitian Pager # [phone] After hours/weekend pager # [phone]

## 2015-08-11 NOTE — Care Management Important Message (Signed)
Important Message  Patient Details  Name: KAYZLEE WIRTANEN MRN: [id number] Date of Birth: 1920/10/26   Medicare Important Message Given:  Yes-third notification given    Renie Ora 08/11/2015, 4:29 PMImportant Message  Patient Details  Name: TYRENE NADER MRN: [id number] Date of Birth: [birth year]   Medicare Important Message Given:  Yes-third notification given    Renie Ora 08/11/2015, 4:29 PM

## 2015-08-11 NOTE — Progress Notes (Addendum)
PROGRESS NOTE  Desiree Hutchinson NFA:[id number] DOB: February 19, 1920 DOA: 08/05/2015 PCP: [name], MD   HPI: 81 year old patient with diverticular disease, HTN, LVH, COPD and aortic stenosis presented to the ED on 08/06/15 for diffusing, cramping abdominal pain that began on 08/05/15 after eating dinner. She denies nausea, vomiting, fever and chills. Pain fluctuates in intensity up to 8/10. She had a normal bowel movement on 08/05/15. Does note her bowel movements have been darker since taking iron supplements. She also complains of generalized weakness x 2 weeks. She has a history of 2 previous abdominal surgeries and does not have a history of colon cancer. She was recently hospitalized 7/23-7/27/16 for diverticulosis of the colon with hemorrhage and required a transfusion for anemia. Abdominal CT in the ED on 08/06/15 demonstrated findings consistent with a small bowel obstruction with no abscesses or perforation.  Patient was initially managed conservatively with NPO, NG tube and IVF, however failed to improve and she was taken to OR on 8/14. She was observed in SDU post op and transferred to floor on 8/15.  Subjective / 24 H Interval events - looks better, very talkative this morning - complains of abdominal "soreness"  Assessment/Plan: Principal Problem:   SBO (small bowel obstruction) Active Problems:   Essential hypertension   Protein-calorie malnutrition   AKI (acute kidney injury)   Small bowel obstruction - patient underwent ex lap with LOA on 8/14 per general surgery, doing well post op - care per surgery, keep NG tune in until tomorrow. TPN until taking po.  Hypertension - PRN IV hydralazine if SBP >180 since she is NPO - stable  Acute Kidney Injury - Likely due to dehydration.  - stable, monitor  COPD - Currently stable, no wheezing  Protein-Calorie Malnutrition - TPN to start as above  Recent GI bleed - Monitor with serial CBCs, no evidence of bleeding  currently  ABLA - post op, monitor CBC   Diet:  NPO Fluids: IVF DVT Prophylaxis: Heparin  Code Status: Full Code Family Communication: no family bedside Disposition Plan: transfer to floor  Consultants:  General surgery  Procedures:  None  Antibiotics  None    Studies  Dg Abd 2 Views  08/10/2015   CLINICAL DATA:  Abdominal distension  EXAM: ABDOMEN - 2 VIEW  COMPARISON:  08/09/2015  FINDINGS: Persistent significant gaseous distended small bowel loops consistent with small bowel obstruction. NG tube coiled within stomach with tip in proximal stomach. No free abdominal air.  IMPRESSION: Persistent gaseous distended small bowel loops consistent with small bowel obstruction.   Electronically Signed   By: Natasha Mead M.D.   On: 08/10/2015 10:44   Objective  Filed Vitals:   08/11/15 0728 08/11/15 0754 08/11/15 0800 08/11/15 0950  BP:    174/43  Pulse: 61 68 64 70  Temp:   97.8 F (36.6 C)   TempSrc:   Oral   Resp: 15 19 13 12   Height:      Weight:      SpO2: 100% 97% 97% 94%    Intake/Output Summary (Last 24 hours) at 08/11/15 1019 Last data filed at 08/11/15 0755  Gross per 24 hour  Intake   3465 ml  Output   1055 ml  Net   2410 ml   Filed Weights   08/06/15 1200 08/10/15 1334 08/11/15 0400  Weight: 41.867 kg (92 lb 4.8 oz) 47.8 kg (105 lb 6.1 oz) 49.5 kg (109 lb 2 oz)   Exam:  GENERAL: NAD  HEENT:  NCAT. Moist mucus membranes.   LUNGS: Clear to auscultation. No wheezing or crackles  HEART: Regular rate and rhythm. Murmur heard. No peripheral edema  ABDOMEN: BS+, mildly distended, more tender today  NEUROLOGIC: Alert and oriented x3  Data Reviewed: Basic Metabolic Panel:  Recent Labs Lab 08/05/15 2333 08/07/15 0845 08/09/15 0450 08/10/15 0428 08/11/15 0522  NA 140 140 141 141 144  K 4.6 4.4 4.5 4.2 4.5  CL 107 108 112* 113* 115*  CO2 24 24 19* 19* 20*  GLUCOSE 136* 104* 74 65 104*  BUN 20 17 27* 26* 25*  CREATININE 1.16* 1.11* 1.01*  0.93 1.03*  CALCIUM 10.0 9.4 9.0 8.9 8.7*   Liver Function Tests:  Recent Labs Lab 08/05/15 2333 08/10/15 0428  AST 27 19  ALT 15 11*  ALKPHOS 99 63  BILITOT 0.5 0.8  PROT 7.4 5.6*  ALBUMIN 4.0 3.1*    Recent Labs Lab 08/05/15 2333  LIPASE 31   CBC:  Recent Labs Lab 08/05/15 2333 08/07/15 0845 08/10/15 0428 08/11/15 0522  WBC 9.3 8.1 4.5 9.0  HGB 11.7* 12.3 10.0* 9.8*  HCT 36.6 37.7 31.1* 30.6*  MCV 95.1 95.4 96.3 93.9  PLT 220 228 157 151   Scheduled Meds: . antiseptic oral rinse  7 mL Mouth Rinse q12n4p  . chlorhexidine  15 mL Mouth Rinse BID  . heparin  5,000 Units Subcutaneous 3 times per day  . sodium chloride  10-40 mL Intracatheter Q12H  . tiotropium  1 capsule Inhalation Daily   Continuous Infusions: . sodium chloride 100 mL/hr at 08/10/15 2121    Pamella Pert, MD [facility] Pager [phone]. If 7 PM - 7 AM, please contact night-coverage at [email or web address], password [facility] 08/11/2015, 10:19 AM  LOS: 5 days

## 2015-08-11 NOTE — Progress Notes (Signed)
Pt transferred to 1407 with all belongings. Daughter informed of room change. Pain medicine given before transfer. Report given to Florentina Addison, Charity fundraiser.

## 2015-08-11 NOTE — Care Management Note (Signed)
Case Management Note  Patient Details  Name: Desiree Hutchinson MRN: [id number] Date of Birth: Sep 01, 1920  Subjective/Objective:    Transfer from SDU. SBO-resection today. From home. PT-HH. Will offer [facility] agency list, then await choice.                Action/Plan:d/c home w/HHC.   Expected Discharge Date:   (UNKNOWN)               Expected Discharge Plan:  Home w Home Health Services  In-House Referral:  NA  Discharge planning Services  CM Consult  Post Acute Care Choice:  NA Choice offered to:  Patient  DME Arranged:  N/A DME Agency:  NA  HH Arranged:    HH Agency:  NA  Status of Service:  In process, will continue to follow  Medicare Important Message Given:  Yes-second notification given Date Medicare IM Given:    Medicare IM give by:    Date Additional Medicare IM Given:    Additional Medicare Important Message give by:     If discussed at Long Length of Stay Meetings, dates discussed:    Additional Comments:  Lanier Clam, RN 08/11/2015, 2:59 PM

## 2015-08-11 NOTE — Evaluation (Signed)
Physical Therapy Re-evaluation Patient Details Name: Desiree Hutchinson MRN: [id number] DOB: 1920/09/28 Today's Date: 08/11/2015   History of Present Illness  81 yo female adm with SBO; PMHx:HTN; s/p exp lap with lysis of adhesions for SBO 08/10/15  Clinical Impression  Pt s/p exp lap for SBO presents with functional mobility limitations 2* generalized weakness, limited endurance, and post op pain.  Pt very motivated to progress to dc home with family assist and would benefit from follow up HHPT.    Follow Up Recommendations Home health PT;Supervision for mobility/OOB    Equipment Recommendations  Rolling walker with 5" wheels    Recommendations for Other Services OT consult     Precautions / Restrictions Precautions Precautions: Fall Restrictions Weight Bearing Restrictions: No      Mobility  Bed Mobility Overal bed mobility: Needs Assistance Bed Mobility: Supine to Sit     Supine to sit: Mod assist     General bed mobility comments: cues for log roll technique with physical assist for LE management and to bring trunk to upright  Transfers Overall transfer level: Needs assistance Equipment used: Rolling walker (2 wheeled) Transfers: Sit to/from Stand Sit to Stand: From elevated surface;Min assist;Mod assist;+2 physical assistance         General transfer comment: cues for use of UEs to self assist  Ambulation/Gait Ambulation/Gait assistance: Min assist;+2 safety/equipment Ambulation Distance (Feet): 74 Feet Assistive device: Rolling walker (2 wheeled) Gait Pattern/deviations: Step-through pattern;Decreased step length - right;Decreased step length - left;Shuffle;Trunk flexed Gait velocity: decreased   General Gait Details: cues for posture and position from RW.  Multiple standing rests to complete task *@ fatigue and abdominal discomfort  Stairs            Wheelchair Mobility    Modified Rankin (Stroke Patients Only)       Balance                                              Pertinent Vitals/Pain Pain Assessment: 0-10 Pain Score: 6  Pain Location: lower abdomen Pain Descriptors / Indicators: Sore Pain Intervention(s): Limited activity within patient's tolerance;Monitored during session;Patient requesting pain meds-RN notified    Home Living Family/patient expects to be discharged to:: Private residence Living Arrangements: Other relatives Available Help at Discharge: Family Type of Home: House Home Access: Stairs to enter   Secretary/administrator of Steps: 2 Home Layout: One level Home Equipment: None      Prior Function Level of Independence: Independent               Hand Dominance        Extremity/Trunk Assessment   Upper Extremity Assessment: Generalized weakness           Lower Extremity Assessment: Generalized weakness      Cervical / Trunk Assessment: Kyphotic  Communication   Communication: No difficulties  Cognition Arousal/Alertness: Awake/alert Behavior During Therapy: WFL for tasks assessed/performed Overall Cognitive Status: Within Functional Limits for tasks assessed                      General Comments      Exercises        Assessment/Plan    PT Assessment Patient needs continued PT services  PT Diagnosis Difficulty walking;Generalized weakness   PT Problem List Decreased strength;Decreased activity tolerance;Decreased balance;Decreased mobility;Decreased knowledge of use of  DME  PT Treatment Interventions DME instruction;Gait training;Functional mobility training;Therapeutic activities;Therapeutic exercise;Patient/family education;Balance training   PT Goals (Current goals can be found in the Care Plan section) Acute Rehab PT Goals Patient Stated Goal: home, will go to one of my family member's but not sure which one PT Goal Formulation: With patient/family Time For Goal Achievement: 08/15/15 Potential to Achieve Goals: Good    Frequency  Min 3X/week   Barriers to discharge        Co-evaluation               End of Session Equipment Utilized During Treatment: Oxygen Activity Tolerance: Patient limited by fatigue Patient left: in chair;with call bell/phone within reach;with family/visitor present Nurse Communication: Mobility status         Time: 4098-1191 PT Time Calculation (min) (ACUTE ONLY): 29 min   Charges:   PT Evaluation $PT Re-evaluation: 1 Procedure (Post surgery) PT Treatments $Gait Training: 8-22 mins   PT G Codes:        Gabbriella Presswood 09/04/2015, 11:44 AM

## 2015-08-11 NOTE — Progress Notes (Signed)
PARENTERAL NUTRITION CONSULT NOTE - INITIAL  Pharmacy Consult for TPN Indication: SBO  No Known Allergies  Patient Measurements: Height: 4\' 11"  (149.9 cm) Weight: 109 lb 2 oz (49.5 kg) IBW/kg (Calculated) : 43.2  Vital Signs: Temp: 97.8 F (36.6 C) (08/15 0400) Temp Source: Oral (08/15 0400) BP: 156/48 mmHg (08/14 2100) Pulse Rate: 68 (08/15 0754) Intake/Output from previous day: 08/14 0701 - 08/15 0700 In: 3570.8 [I.V.:3490.8; NG/GT:30; IV Piggyback:50] Out: 1365 [Urine:1015; Emesis/NG output:300; Blood:50] Intake/Output from this shift: Total I/O In: 191.7 [I.V.:191.7] Out: 40 [Emesis/NG output:40]  Labs:  Recent Labs  08/10/15 0428 08/11/15 0522  WBC 4.5 9.0  HGB 10.0* 9.8*  HCT 31.1* 30.6*  PLT 157 151    Recent Labs  08/09/15 0450 08/10/15 0428 08/11/15 0522  NA 141 141 144  K 4.5 4.2 4.5  CL 112* 113* 115*  CO2 19* 19* 20*  GLUCOSE 74 65 104*  BUN 27* 26* 25*  CREATININE 1.01* 0.93 1.03*  CALCIUM 9.0 8.9 8.7*  PROT  --  5.6*  --   ALBUMIN  --  3.1*  --   AST  --  19  --   ALT  --  11*  --   ALKPHOS  --  63  --   BILITOT  --  0.8  --   PREALBUMIN 13.5*  --   --    Estimated Creatinine Clearance: 23.3 mL/min (by C-G formula based on Cr of 1.03).   No results for input(s): GLUCAP in the last 72 hours.  Medical History: Past Medical History  Diagnosis Date  . History of atherosclerotic cardiovascular disease   . Hypertension   . Left ventricular hypertrophy   . Gastritis   . Cough   . Personal history of tobacco use, presenting hazards to health   . Obesity   . Chest pain, non-cardiac   . Allergic rhinitis   . COPD (chronic obstructive pulmonary disease)   . History of small bowel obstruction   . Chronic diastolic heart failure   . Aortic valve disease     regurge > stenosis   Medications:  Scheduled:  . antiseptic oral rinse  7 mL Mouth Rinse q12n4p  . chlorhexidine  15 mL Mouth Rinse BID  . heparin  5,000 Units Subcutaneous 3  times per day  . insulin aspart  0-9 Units Subcutaneous 4 times per day  . sodium chloride  10-40 mL Intracatheter Q12H  . tiotropium  1 capsule Inhalation Daily   Infusions:  . sodium chloride    . Marland KitchenTPN (CLINIMIX-E) Adult     And  . fat emulsion     Insulin Requirements: none  Current Nutrition: NPO  IVF: NS at 100 ml/hr  Central access:  8/14 TPN start date:  8/15  ASSESSMENT                                                                                                          HPI: 66 yoF admit 8/10 with abd pain, hx of diverticular disease, SBO. On 8/14 underwent Expl  Lap & LOA for persistent SBO after 4 days conservative management. Begin TPN 8/15, Pharmacy to dose.  Significant events:   Today:   Glucose - Serum glucose 104  Electrolytes - wnl,   Renal - wnl  LFTs - wnl  TGs - pending  Prealbumin - pending  NUTRITIONAL GOALS                                                                                             RD recs: pending re-assessment for TPN Clinimix E 5/15 at a goal rate of 60 ml/hr + 20% fat emulsion at 10 ml/hr to provide: 72 g/day protein, 1500 Kcal/day.  PLAN                                                                                                                         At 1800 today:  Start Clinimix E 5/15 at 40 ml/hr.  20% fat emulsion at 10 ml/hr.  Plan to advance as tolerated to the goal rate.  TPN to contain standard multivitamins and trace elements.  Reduce IVF to 60 ml/hr.  Add Novolog sensitive scale q6hr  TPN lab panels on Mondays & Thursdays.  F/u daily.  Otho Bellows PharmD Pager [phone] 08/11/2015, 11:02 AM

## 2015-08-11 NOTE — Care Management Note (Signed)
Case Management Note  Patient Details  Name: Desiree Hutchinson MRN: [id number] Date of Birth: Jan 10, 1920  Subjective/Objective:               Bowel resection     Action/Plan:Date:  August 11, 2015 U.R. performed for needs and level of care. Will continue to follow for Case Management needs.  Marcelle Smiling, RN, BSN, [place]   [phone]   Expected Discharge Date:   (UNKNOWN)               Expected Discharge Plan:  Home/Self Care  In-House Referral:  NA  Discharge planning Services  CM Consult  Post Acute Care Choice:  NA Choice offered to:  NA  DME Arranged:  N/A DME Agency:  NA  HH Arranged:  NA HH Agency:  NA  Status of Service:  In process, will continue to follow  Medicare Important Message Given:  Yes-second notification given Date Medicare IM Given:    Medicare IM give by:    Date Additional Medicare IM Given:    Additional Medicare Important Message give by:     If discussed at Long Length of Stay Meetings, dates discussed:    Additional Comments:  Golda Acre, RN 08/11/2015, 10:13 AM

## 2015-08-11 NOTE — Progress Notes (Signed)
General Surgery Note  LOS: 5 days  POD -  1 Day Post-Op  Assessment/Plan: 1.  SMALL BOWEL OBSTRUCTION, EXPLORATORY LAPAROTOMY, LYSIS OF ADHESION - 08/10/2015 - [place]  Looks surprisingly good  Okay to get A line and foley out  Will leave NGT for one more day  Okay to move out of ICU  Will ambulate  Discussed with Dr. Elvera Lennox about starting TPN  2.  HTN 3.  COPD 4.  Aortic stenosis 5.  DVT prophylaxis - Heparin SQ 6.  Anemia -  Hgb - 9.8 - 08/11/2015   Principal Problem:   SBO (small bowel obstruction) Active Problems:   Essential hypertension   Protein-calorie malnutrition   AKI (acute kidney injury)   Subjective:  Looks better than expected.  No specific problem.    In room by self. Objective:   Filed Vitals:   08/11/15 0600  BP:   Pulse: 60  Temp:   Resp: 14     Intake/Output from previous day:  08/14 0701 - 08/15 0700 In: 3570.8 [I.V.:3490.8; NG/GT:30; IV Piggyback:50] Out: 1365 [Urine:1015; Emesis/NG output:300; Blood:50]  Intake/Output this shift:      Physical Exam:   General: Thin older AA F who is alert and oriented.    HEENT: Normal. Pupils equal. .   Lungs: Clear.  Pulls 1,000 cc on IS   Abdomen: Rare BS   Wound: Some staining at bandage.   Lab Results:    Recent Labs  08/10/15 0428 08/11/15 0522  WBC 4.5 9.0  HGB 10.0* 9.8*  HCT 31.1* 30.6*  PLT 157 151    BMET   Recent Labs  08/10/15 0428 08/11/15 0522  NA 141 144  K 4.2 4.5  CL 113* 115*  CO2 19* 20*  GLUCOSE 65 104*  BUN 26* 25*  CREATININE 0.93 1.03*  CALCIUM 8.9 8.7*    PT/INR  No results for input(s): LABPROT, INR in the last 72 hours.  ABG  No results for input(s): PHART, HCO3 in the last 72 hours.  Invalid input(s): PCO2, PO2   Studies/Results:  Dg Abd 1 View  August 10, 2015   CLINICAL DATA:  Small bowel obstruction, abdominal distension  EXAM: ABDOMEN - 1 VIEW  COMPARISON:  08/07/2015  FINDINGS: Upper abdomen is excluded from the field of view. Numerous  gas-filled dilated loops of small bowel which appears similar to the prior exam. There is no bowel dilatation to suggest obstruction. There is no evidence of pneumoperitoneum, portal venous gas or pneumatosis. There are no pathologic calcifications along the expected course of the ureters.The osseous structures are unremarkable.  IMPRESSION: Persistent numerous gas-filled dilated loops of small bowel similar to the prior exam consistent with small-bowel obstruction.   Electronically Signed   By: Elige Ko   On: 08/10/15 09:08   Dg Abd 2 Views  08/10/2015   CLINICAL DATA:  Abdominal distension  EXAM: ABDOMEN - 2 VIEW  COMPARISON:  08-10-15  FINDINGS: Persistent significant gaseous distended small bowel loops consistent with small bowel obstruction. NG tube coiled within stomach with tip in proximal stomach. No free abdominal air.  IMPRESSION: Persistent gaseous distended small bowel loops consistent with small bowel obstruction.   Electronically Signed   By: Natasha Mead M.D.   On: 08/10/2015 10:44     Anti-infectives:   Anti-infectives    Start     Dose/Rate Route Frequency Ordered Stop   08/10/15 0931  cefOXitin (MEFOXIN) 2 g in dextrose 5 % 50 mL IVPB  2 g 100 mL/hr over 30 Minutes Intravenous 30 min pre-op 08/10/15 0929 08/10/15 1039      Ovidio Kin, MD, FACS Pager: [phone] Surgery Office: [phone] 08/11/2015

## 2015-08-12 LAB — CBC
HCT: 26 % — ABNORMAL LOW (ref 36.0–46.0)
Hemoglobin: 8.4 g/dL — ABNORMAL LOW (ref 12.0–15.0)
MCH: 30.2 pg (ref 26.0–34.0)
MCHC: 32.3 g/dL (ref 30.0–36.0)
MCV: 93.5 fL (ref 78.0–100.0)
Platelets: 128 10*3/uL — ABNORMAL LOW (ref 150–400)
RBC: 2.78 MIL/uL — ABNORMAL LOW (ref 3.87–5.11)
RDW: 15.1 % (ref 11.5–15.5)
WBC: 7.3 10*3/uL (ref 4.0–10.5)

## 2015-08-12 LAB — COMPREHENSIVE METABOLIC PANEL
ALT: 10 U/L — ABNORMAL LOW (ref 14–54)
AST: 17 U/L (ref 15–41)
Albumin: 2.5 g/dL — ABNORMAL LOW (ref 3.5–5.0)
Alkaline Phosphatase: 44 U/L (ref 38–126)
Anion gap: 4 — ABNORMAL LOW (ref 5–15)
BUN: 20 mg/dL (ref 6–20)
CO2: 25 mmol/L (ref 22–32)
Calcium: 8.4 mg/dL — ABNORMAL LOW (ref 8.9–10.3)
Chloride: 111 mmol/L (ref 101–111)
Creatinine, Ser: 0.77 mg/dL (ref 0.44–1.00)
GFR calc Af Amer: >60 mL/min (ref >60)
GFR calc non Af Amer: >60 mL/min (ref >60)
Glucose, Bld: 155 mg/dL — ABNORMAL HIGH (ref 65–99)
Potassium: 3.5 mmol/L (ref 3.5–5.1)
Sodium: 140 mmol/L (ref 135–145)
Total Bilirubin: 0.5 mg/dL (ref 0.3–1.2)
Total Protein: 4.6 g/dL — ABNORMAL LOW (ref 6.5–8.1)

## 2015-08-12 LAB — TRIGLYCERIDES: Triglycerides: 87 mg/dL (ref <150)

## 2015-08-12 LAB — GLUCOSE, CAPILLARY
Glucose-Capillary: 109 mg/dL — ABNORMAL HIGH (ref 65–99)
Glucose-Capillary: 121 mg/dL — ABNORMAL HIGH (ref 65–99)
Glucose-Capillary: 122 mg/dL — ABNORMAL HIGH (ref 65–99)
Glucose-Capillary: 141 mg/dL — ABNORMAL HIGH (ref 65–99)
Glucose-Capillary: 142 mg/dL — ABNORMAL HIGH (ref 65–99)

## 2015-08-12 LAB — MAGNESIUM: Magnesium: 2.1 mg/dL (ref 1.7–2.4)

## 2015-08-12 LAB — PREALBUMIN: Prealbumin: 7.6 mg/dL — ABNORMAL LOW (ref 18–38)

## 2015-08-12 LAB — PHOSPHORUS: Phosphorus: 1.8 mg/dL — ABNORMAL LOW (ref 2.5–4.6)

## 2015-08-12 MED ORDER — FAT EMULSION 20 % IV EMUL
240.0000 mL | INTRAVENOUS | Status: AC
  Administered 2015-08-12: 240 mL via INTRAVENOUS
  Filled 2015-08-12: qty 250

## 2015-08-12 MED ORDER — POTASSIUM PHOSPHATES 15 MMOLE/5ML IV SOLN
15.0000 mmol | Freq: Once | INTRAVENOUS | Status: AC
  Administered 2015-08-12: 15 mmol via INTRAVENOUS
  Filled 2015-08-12: qty 5

## 2015-08-12 MED ORDER — ACETAMINOPHEN 325 MG PO TABS: 325.0000 mg | ORAL_TABLET | Freq: Four times a day (QID) | ORAL | Status: DC | PRN

## 2015-08-12 MED ORDER — INSULIN ASPART 100 UNIT/ML ~~LOC~~ SOLN
0.0000 [IU] | SUBCUTANEOUS | Status: DC
  Administered 2015-08-12 – 2015-08-15 (×6): 1 [IU] via SUBCUTANEOUS

## 2015-08-12 MED ORDER — TRACE MINERALS CR-CU-MN-SE-ZN 10-1000-500-60 MCG/ML IV SOLN
INTRAVENOUS | Status: AC
  Administered 2015-08-12: 18:00:00 via INTRAVENOUS
  Filled 2015-08-12: qty 1440

## 2015-08-12 NOTE — Clinical Documentation Improvement (Signed)
For accurate Dx specificity & severity can noted "Protein-calorie malnutrition" be further specified with severity. Thank you . Severity: --Mild (first degree) --Moderate (second degree) --Severe (third degree) . Avoid documenting a range of severity, such as "moderate to severe" --Other . Document any associated diagnoses/conditions  Supporting Information: See full Nutrition evaluation for TPN initiation 08/11/15 with noted severity  Thank You, Toribio Harbour, RN, BSN, CCDS Certified Clinical Documentation Specialist Pager: [phone] [place]: Health Information Management

## 2015-08-12 NOTE — Progress Notes (Signed)
[facility] PROGRESS NOTE  Desiree Hutchinson:[id number] DOB: 1920/11/26 DOA: 08/05/2015 PCP: [name], MD  HPI: 81 year old patient with diverticular disease, HTN, LVH, COPD and aortic stenosis presented to the ED on 08/06/15 for diffusing, cramping abdominal pain that began on 08/05/15 after eating dinner. She denies nausea, vomiting, fever and chills. Pain fluctuates in intensity up to 8/10. She had a normal bowel movement on 08/05/15. Does note her bowel movements have been darker since taking iron supplements. She also complains of generalized weakness x 2 weeks. She has a history of 2 previous abdominal surgeries and does not have a history of colon cancer. She was recently hospitalized 7/23-7/27/16 for diverticulosis of the colon with hemorrhage and required a transfusion for anemia. Abdominal CT in the ED on 08/06/15 demonstrated findings consistent with a small bowel obstruction with no abscesses or perforation.  Patient was initially managed conservatively with NPO, NG tube and IVF, however failed to improve and she was taken to OR on 8/14. She was observed in SDU post op and transferred to floor on 8/15.  NG was successfully clamped on 8/16 and pt started on clears.  Assessment/Plan: Small bowel obstruction - patient underwent ex lap with LOA on 8/14 per general surgery, doing well post op - Pt has tolerated clamping of NG tube and has started clears per Surgery.  Hypertension - PRN IV hydralazine if SBP >180 since she is NPO - remains stable  Acute Kidney Injury - Likely due to dehydration.  - Cr remains stable, monitor renal panel  COPD - Currently stable, no wheezing  Protein-Calorie Malnutrition - TPN continued - Ultimate plans to stop when reliably tolerating PO nutrition  Recent GI bleed - Monitor with serial CBCs, no evidence of bleeding currently  ABLA - post op, monitor CBC  Code Status: Full Family Communication: Pt in room (indicate person spoken with,  relationship, and if by phone, the number) Disposition Plan: Pending  Consultants:  General Surgery  Procedures:  Ex lap with lysis of adhesion 8/14  Antibiotics:    HPI/Subjective: No complaints. Denies nausea  Objective: Filed Vitals:   08/11/15 1433 08/11/15 2355 08/12/15 0510 08/12/15 1350  BP: 153/60 143/51 146/64 155/52  Pulse: 70 68 70 61  Temp: 97.5 F (36.4 C) 98.2 F (36.8 C) 98.4 F (36.9 C) 98.6 F (37 C)  TempSrc: Oral Oral Oral Oral  Resp: Height:      Weight:      SpO2: 98% 92% 95% 100%    Intake/Output Summary (Last 24 hours) at 08/12/15 1828 Last data filed at 08/12/15 1500  Gross per 24 hour  Intake 2667.16 ml  Output   1150 ml  Net 1517.16 ml   Filed Weights   08/10/15 1334 08/11/15 0400 08/11/15 1147  Weight: 47.8 kg (105 lb 6.1 oz) 49.5 kg (109 lb 2 oz) 49.3 kg (108 lb 11 oz)    Exam:   General:  Awake, in nad, NG in place, clamped  Cardiovascular: regular, s1, s2  Respiratory: normal resp effort, no wheezing  Abdomen: soft, nondistended  Musculoskeletal: perfused, no clubbing   Data Reviewed: Basic Metabolic Panel:  Recent Labs Lab 08/07/15 0845 08/09/15 0450 08/10/15 0428 08/11/15 0522 08/11/15 0900 08/12/15 0420  NA 140 141 141 144  --  140  K 4.4 4.5 4.2 4.5  --  3.5  CL 108 112* 113* 115*  --  111  CO2 24 19* 19* 20*  --  25  GLUCOSE  104* 74 65 104*  --  155*  BUN 17 27* 26* 25*  --  20  CREATININE 1.11* 1.01* 0.93 1.03*  --  0.77  CALCIUM 9.4 9.0 8.9 8.7*  --  8.4*  MG  --   --   --   --  1.9 2.1  PHOS  --   --   --   --  3.2 1.8*   Liver Function Tests:  Recent Labs Lab 08/05/15 2333 08/10/15 0428 08/12/15 0420  AST 27 19 17   ALT 15 11* 10*  ALKPHOS 99 63 44  BILITOT 0.5 0.8 0.5  PROT 7.4 5.6* 4.6*  ALBUMIN 4.0 3.1* 2.5*    Recent Labs Lab 08/05/15 2333  LIPASE 31   No results for input(s): AMMONIA in the last 168 hours. CBC:  Recent Labs Lab 08/05/15 2333 08/07/15 0845  08/10/15 0428 08/11/15 0522 08/12/15 0420  WBC 9.3 8.1 4.5 9.0 7.3  HGB 11.7* 12.3 10.0* 9.8* 8.4*  HCT 36.6 37.7 31.1* 30.6* 26.0*  MCV 95.1 95.4 96.3 93.9 93.5  PLT 220 228 157 151 128*   Cardiac Enzymes: No results for input(s): CKTOTAL, CKMB, CKMBINDEX, TROPONINI in the last 168 hours. BNP (last 3 results) No results for input(s): BNP in the last 8760 hours.  ProBNP (last 3 results) No results for input(s): PROBNP in the last 8760 hours.  CBG:  Recent Labs Lab 08/11/15 1817 08/11/15 2354 08/12/15 0622 08/12/15 1204 08/12/15 1628  GLUCAP 92 138* 142* 141* 121*    Recent Results (from the past 240 hour(s))  Surgical pcr screen     Status: None   Collection Time: 08/10/15  9:40 AM  Result Value Ref Range Status   MRSA, PCR NEGATIVE NEGATIVE Final   Staphylococcus aureus NEGATIVE NEGATIVE Final    Comment:        The Xpert SA Assay (FDA approved for NASAL specimens in patients over 32 years of age), is one component of a comprehensive surveillance program.  Test performance has been validated by [facility] for patients greater than or equal to 78 year old. It is not intended to diagnose infection nor to guide or monitor treatment.      Studies: No results found.  Scheduled Meds: . antiseptic oral rinse  7 mL Mouth Rinse q12n4p  . chlorhexidine  15 mL Mouth Rinse BID  . heparin  5,000 Units Subcutaneous 3 times per day  . insulin aspart  0-9 Units Subcutaneous 6 times per day  . potassium phosphate IVPB (mmol)  15 mmol Intravenous Once  . sodium chloride  10-40 mL Intracatheter Q12H  . tiotropium  1 capsule Inhalation Daily   Continuous Infusions: . Marland KitchenTPN (CLINIMIX-E) Adult 60 mL/hr at 08/12/15 1811   And  . fat emulsion 240 mL (08/12/15 1811)  . sodium chloride 60 mL/hr at 08/12/15 1112    Principal Problem:   SBO (small bowel obstruction) Active Problems:   Essential hypertension   Protein-calorie malnutrition   AKI (acute kidney  injury)   Stephana Morell K  [facility] Pager [phone]. If 7PM-7AM, please contact night-coverage at [email or web address], password [facility] 08/12/2015, 6:28 PM  LOS: 6 days

## 2015-08-12 NOTE — Progress Notes (Signed)
Patient ID: Desiree Hutchinson, female   DOB: July 29, 1920, 81 y.o.   MRN: [id number]     [name]      [address] [place], [place] [address]    Phone: [phone] FAX: [phone]     Subjective: Passing flatus.  No n/v.  Thirsty.  Little NGT output.    Objective:  Vital signs:  Filed Vitals:   08/11/15 1147 08/11/15 1433 08/11/15 2355 08/12/15 0510  BP: 173/68 153/60 143/51 146/64  Pulse: 72 70 68 70  Temp: 97.6 F (36.4 C) 97.5 F (36.4 C) 98.2 F (36.8 C) 98.4 F (36.9 C)  TempSrc: Oral Oral Oral Oral  Resp: 18 18 18 18   Height: 4' 11"  (1.499 m)     Weight: 49.3 kg (108 lb 11 oz)     SpO2: 100% 98% 92% 95%    Last BM Date: 08/03/15  Intake/Output   Yesterday:  08/15 0701 - 08/16 0700 In: 1714.7 [P.O.:120; I.V.:949.7; TPN:645] Out: 240 [Urine:200; Emesis/NG output:40] This shift: I/O last 3 completed shifts: In: 2844.7 [P.O.:120; I.V.:2049.7; NG/GT:30] Out: 705 [Urine:565; Emesis/NG output:140]    Physical Exam: General: Pt awake/alert/oriented x4 in no acute distress  Abdomen: Soft.  Nondistended. Tender over incision, dressing removed, staples in place, no erythema or drainage.  No evidence of peritonitis.  No incarcerated hernias.   Problem List:   Principal Problem:   SBO (small bowel obstruction) Active Problems:   Essential hypertension   Protein-calorie malnutrition   AKI (acute kidney injury)    Results:   Labs: Results for orders placed or performed during the hospital encounter of 08/05/15 (from the past 70 hour(s))  Surgical pcr screen     Status: None   Collection Time: 08/10/15  9:40 AM  Result Value Ref Range   MRSA, PCR NEGATIVE NEGATIVE   Staphylococcus aureus NEGATIVE NEGATIVE    Comment:        The Xpert SA Assay (FDA approved for NASAL specimens in patients over 65 years of age), is one component of a comprehensive surveillance program.  Test performance has been  validated by [facility] for patients greater than or equal to 1 year old. It is not intended to diagnose infection nor to guide or monitor treatment.   Basic metabolic panel     Status: Abnormal   Collection Time: 08/11/15  5:22 AM  Result Value Ref Range   Sodium 144 135 - 145 mmol/L   Potassium 4.5 3.5 - 5.1 mmol/L   Chloride 115 (H) 101 - 111 mmol/L   CO2 20 (L) 22 - 32 mmol/L   Glucose, Bld 104 (H) 65 - 99 mg/dL   BUN 25 (H) 6 - 20 mg/dL   Creatinine, Ser 1.03 (H) 0.44 - 1.00 mg/dL   Calcium 8.7 (L) 8.9 - 10.3 mg/dL   GFR calc non Af Amer 45 (L) >60 mL/min   GFR calc Af Amer 53 (L) >60 mL/min    Comment: (NOTE) The eGFR has been calculated using the CKD EPI equation. This calculation has not been validated in all clinical situations. eGFR's persistently <60 mL/min signify possible Chronic Kidney Disease.    Anion gap 9 5 - 15  CBC     Status: Abnormal   Collection Time: 08/11/15  5:22 AM  Result Value Ref Range   WBC 9.0 4.0 - 10.5 K/uL   RBC 3.26 (L) 3.87 - 5.11 MIL/uL   Hemoglobin 9.8 (L) 12.0 - 15.0 g/dL  HCT 30.6 (L) 36.0 - 46.0 %   MCV 93.9 78.0 - 100.0 fL   MCH 30.1 26.0 - 34.0 pg   MCHC 32.0 30.0 - 36.0 g/dL   RDW 14.8 11.5 - 15.5 %   Platelets 151 150 - 400 K/uL  Magnesium     Status: None   Collection Time: 08/11/15  9:00 AM  Result Value Ref Range   Magnesium 1.9 1.7 - 2.4 mg/dL  Phosphorus     Status: None   Collection Time: 08/11/15  9:00 AM  Result Value Ref Range   Phosphorus 3.2 2.5 - 4.6 mg/dL  Glucose, capillary     Status: None   Collection Time: 08/11/15  2:35 PM  Result Value Ref Range   Glucose-Capillary 85 65 - 99 mg/dL  Glucose, capillary     Status: None   Collection Time: 08/11/15  6:17 PM  Result Value Ref Range   Glucose-Capillary 92 65 - 99 mg/dL  Glucose, capillary     Status: Abnormal   Collection Time: 08/11/15 11:54 PM  Result Value Ref Range   Glucose-Capillary 138 (H) 65 - 99 mg/dL  Comprehensive metabolic panel      Status: Abnormal   Collection Time: 08/12/15  4:20 AM  Result Value Ref Range   Sodium 140 135 - 145 mmol/L   Potassium 3.5 3.5 - 5.1 mmol/L    Comment: RESULT REPEATED AND VERIFIED DELTA CHECK NOTED    Chloride 111 101 - 111 mmol/L   CO2 25 22 - 32 mmol/L   Glucose, Bld 155 (H) 65 - 99 mg/dL   BUN 20 6 - 20 mg/dL   Creatinine, Ser 0.77 0.44 - 1.00 mg/dL   Calcium 8.4 (L) 8.9 - 10.3 mg/dL   Total Protein 4.6 (L) 6.5 - 8.1 g/dL   Albumin 2.5 (L) 3.5 - 5.0 g/dL   AST 17 15 - 41 U/L   ALT 10 (L) 14 - 54 U/L   Alkaline Phosphatase 44 38 - 126 U/L   Total Bilirubin 0.5 0.3 - 1.2 mg/dL   GFR calc non Af Amer >60 >60 mL/min   GFR calc Af Amer >60 >60 mL/min    Comment: (NOTE) The eGFR has been calculated using the CKD EPI equation. This calculation has not been validated in all clinical situations. eGFR's persistently <60 mL/min signify possible Chronic Kidney Disease.    Anion gap 4 (L) 5 - 15  Magnesium     Status: None   Collection Time: 08/12/15  4:20 AM  Result Value Ref Range   Magnesium 2.1 1.7 - 2.4 mg/dL  Phosphorus     Status: Abnormal   Collection Time: 08/12/15  4:20 AM  Result Value Ref Range   Phosphorus 1.8 (L) 2.5 - 4.6 mg/dL  CBC     Status: Abnormal   Collection Time: 08/12/15  4:20 AM  Result Value Ref Range   WBC 7.3 4.0 - 10.5 K/uL   RBC 2.78 (L) 3.87 - 5.11 MIL/uL   Hemoglobin 8.4 (L) 12.0 - 15.0 g/dL   HCT 26.0 (L) 36.0 - 46.0 %   MCV 93.5 78.0 - 100.0 fL   MCH 30.2 26.0 - 34.0 pg   MCHC 32.3 30.0 - 36.0 g/dL   RDW 15.1 11.5 - 15.5 %   Platelets 128 (L) 150 - 400 K/uL  Glucose, capillary     Status: Abnormal   Collection Time: 08/12/15  6:22 AM  Result Value Ref Range   Glucose-Capillary 142 (H) 65 -  99 mg/dL    Imaging / Studies: Dg Abd 2 Views  08/10/2015   CLINICAL DATA:  Abdominal distension  EXAM: ABDOMEN - 2 VIEW  COMPARISON:  08/09/2015  FINDINGS: Persistent significant gaseous distended small bowel loops consistent with small bowel  obstruction. NG tube coiled within stomach with tip in proximal stomach. No free abdominal air.  IMPRESSION: Persistent gaseous distended small bowel loops consistent with small bowel obstruction.   Electronically Signed   By: Lahoma Crocker M.D.   On: 08/10/2015 10:44    Medications / Allergies:  Scheduled Meds: . antiseptic oral rinse  7 mL Mouth Rinse q12n4p  . chlorhexidine  15 mL Mouth Rinse BID  . heparin  5,000 Units Subcutaneous 3 times per day  . insulin aspart  0-9 Units Subcutaneous 4 times per day  . sodium chloride  10-40 mL Intracatheter Q12H  . tiotropium  1 capsule Inhalation Daily   Continuous Infusions: . sodium chloride 60 mL/hr at 08/11/15 1817  . Marland KitchenTPN (CLINIMIX-E) Adult 40 mL/hr at 08/11/15 1801   And  . fat emulsion 240 mL (08/11/15 1801)   PRN Meds:.hydrALAZINE, iohexol, morphine injection, ondansetron, sodium chloride  Antibiotics: Anti-infectives    Start     Dose/Rate Route Frequency Ordered Stop   08/10/15 0931  cefOXitin (MEFOXIN) 2 g in dextrose 5 % 50 mL IVPB     2 g 100 mL/hr over 30 Minutes Intravenous 30 min pre-op 08/10/15 0929 08/10/15 1039        Assessment/Plan SBO POD#2 exploratory laparotomy, LOA---Dr. Excell Seltzer   -start clamping trial.  DC in 6 hours if residual <300 and start clears  -up to air, mobilize, PT  -add IS and tylenol PCM-continue TPN VTE prophylaxis-SCD/heparin HTN-on PRN hydralazine  COPD-pulmonary toilet Aortic stenosis Anemia-stable h&h Dispo-ileus  Erby Pian, ANP-[facility]  08/12/2015 8:10 AM  Agree with above. NGT is now out.  Abdomen soft.   Alphonsa Overall, MD, [facility] Pager: [phone] Office phone:  [phone]

## 2015-08-12 NOTE — Progress Notes (Signed)
PARENTERAL NUTRITION CONSULT NOTE  Pharmacy Consult for TPN Indication: SBO  No Known Allergies  Patient Measurements: Height:  (149.9 cm) Weight: 108 lb 11 oz (49.3 kg) IBW/kg (Calculated) : 43.2  Vital Signs: Temp: 98.4 F (36.9 C) (08/16 0510) Temp Source: Oral (08/16 0510) BP: 146/64 mmHg (08/16 0510) Pulse Rate: 70 (08/16 0510) Intake/Output from previous day: 08/15 0701 - 08/16 0700 In: 1714.7 [P.O.:120; I.V.:949.7; TPN:645] Out: 240 [Urine:200; Emesis/NG output:40] Intake/Output from this shift: Total I/O In: -  Out: 400 [Urine:400]  Labs:  Recent Labs  08/10/15 0428 08/11/15 0522 08/12/15 0420  WBC 4.5 9.0 7.3  HGB 10.0* 9.8* 8.4*  HCT 31.1* 30.6* 26.0*  PLT 157 151 128*    Recent Labs  08/10/15 0428 08/11/15 0522 08/11/15 0900 08/12/15 0420  NA 141 144  --  140  K 4.2 4.5  --  3.5  CL 113* 115*  --  111  CO2 19* 20*  --  25  GLUCOSE 65 104*  --  155*  BUN 26* 25*  --  20  CREATININE 0.93 1.03*  --  0.77  CALCIUM 8.9 8.7*  --  8.4*  MG  --   --  1.9 2.1  PHOS  --   --  3.2 1.8*  PROT 5.6*  --   --  4.6*  ALBUMIN 3.1*  --   --  2.5*  AST 19  --   --  17  ALT 11*  --   --  10*  ALKPHOS 63  --   --  44  BILITOT 0.8  --   --  0.5  PREALBUMIN  --   --   --  7.6*  TRIG  --   --   --  87   Estimated Creatinine Clearance: 30 mL/min (by C-G formula based on Cr of 0.77).    Recent Labs  08/11/15 1817 08/11/15 2354 08/12/15 0622  GLUCAP 92 138* 142*    Medical History: Past Medical History  Diagnosis Date  . History of atherosclerotic cardiovascular disease   . Hypertension   . Left ventricular hypertrophy   . Gastritis   . Cough   . Personal history of tobacco use, presenting hazards to health   . Obesity   . Chest pain, non-cardiac   . Allergic rhinitis   . COPD (chronic obstructive pulmonary disease)   . History of small bowel obstruction   . Chronic diastolic heart failure   . Aortic valve disease     regurge >  stenosis   Medications:  Scheduled:  . antiseptic oral rinse  7 mL Mouth Rinse q12n4p  . chlorhexidine  15 mL Mouth Rinse BID  . heparin  5,000 Units Subcutaneous 3 times per day  . insulin aspart  0-9 Units Subcutaneous 4 times per day  . sodium chloride  10-40 mL Intracatheter Q12H  . tiotropium  1 capsule Inhalation Daily   Infusions:  . sodium chloride 60 mL/hr at 08/12/15 1112  . Marland KitchenTPN (CLINIMIX-E) Adult 40 mL/hr at 08/11/15 1801   And  . fat emulsion 240 mL (08/11/15 1801)   Insulin Requirements: 2 units since TPN started  Current Nutrition: NPO  IVF: NS at 60 ml/hr  Central access:  8/14 TPN start date:  8/15  ASSESSMENT  HPI: 65 yoF admit 8/10 with abd pain, hx of diverticular disease, SBO. On 8/14 underwent Expl Lap & LOA for persistent SBO after 4 days conservative management. Begin TPN 8/15, Pharmacy to dose.  Significant events:  8/15: TPN started  Today:   Glucose - Serum glucose 155  Electrolytes - K+ 3.5, Phos 1.8, other electrolytes wnl  Renal - wnl  LFTs - wnl  TGs - 87  Prealbumin - 7.6  NUTRITIONAL GOALS                                                                                             RD recs:  Kcal: 1250-1450 Protein: 65-75g Fluid: 1.5L/day  Clinimix E 5/15 at a goal rate of 60 ml/hr + 20% fat emulsion at 10 ml/hr to provide: 72 g/day protein, 1500 Kcal/day.  PLAN                                                                                                                         At 1800 today:  Increase Clinimix E 5/15 to 60 ml/hr (goal rate).  20% fat emulsion at 10 ml/hr.  TPN to contain standard multivitamins and trace elements.  Reduce IVF to 40 ml/hr.  Change insulin coverage to Novolog sensitive scale q4h  TPN lab panels on Mondays & Thursdays.  Replete potassium & phosphorus with KPhos IV  x1  F/u daily.  Junita Push, PharmD, BCPS Pager: [phone] 08/12/2015, 12:24 PM

## 2015-08-12 NOTE — Progress Notes (Signed)
Pt NG Tube clamped for 5.5 hours. Pt had no complaints of nausea. Suction turned on for about 15 minutes. Very small amount of green liquid was suctioned out (approximately 20mL). Pt did not have any complaints about nausea. NG tube was removed per MD order. Pt is starting clear liquid diet.  Craig Staggers, RN

## 2015-08-13 LAB — GLUCOSE, CAPILLARY
Glucose-Capillary: 104 mg/dL — ABNORMAL HIGH (ref 65–99)
Glucose-Capillary: 115 mg/dL — ABNORMAL HIGH (ref 65–99)
Glucose-Capillary: 103 mg/dL — ABNORMAL HIGH (ref 65–99)
Glucose-Capillary: 121 mg/dL — ABNORMAL HIGH (ref 65–99)
Glucose-Capillary: 106 mg/dL — ABNORMAL HIGH (ref 65–99)

## 2015-08-13 MED ORDER — HYDROCODONE-ACETAMINOPHEN 5-325 MG PO TABS
1.0000 | ORAL_TABLET | ORAL | Status: DC | PRN
  Administered 2015-08-13 – 2015-08-14 (×3): 2 via ORAL
  Administered 2015-08-16 – 2015-08-17 (×4): 1 via ORAL
  Administered 2015-08-18 – 2015-08-20 (×3): 2 via ORAL
  Administered 2015-08-21: 1 via ORAL
  Administered 2015-08-21: 2 via ORAL
  Filled 2015-08-13: qty 2
  Filled 2015-08-13: qty 1
  Filled 2015-08-13: qty 2
  Filled 2015-08-13: qty 1
  Filled 2015-08-13 (×3): qty 2
  Filled 2015-08-13 (×2): qty 1
  Filled 2015-08-13: qty 2
  Filled 2015-08-13: qty 1
  Filled 2015-08-13: qty 2

## 2015-08-13 MED ORDER — SODIUM CHLORIDE 0.9 % IV SOLN
INTRAVENOUS | Status: DC
  Administered 2015-08-13 – 2015-08-15 (×3): via INTRAVENOUS

## 2015-08-13 MED ORDER — FAT EMULSION 20 % IV EMUL
240.0000 mL | INTRAVENOUS | Status: AC
  Administered 2015-08-13: 240 mL via INTRAVENOUS
  Filled 2015-08-13: qty 250

## 2015-08-13 MED ORDER — TRACE MINERALS CR-CU-MN-SE-ZN 10-1000-500-60 MCG/ML IV SOLN
INTRAVENOUS | Status: AC
  Administered 2015-08-13: 18:00:00 via INTRAVENOUS
  Filled 2015-08-13: qty 1440

## 2015-08-13 NOTE — Progress Notes (Signed)
Nutrition Follow-up  DOCUMENTATION CODES:   Severe malnutrition in context of chronic illness  INTERVENTION:  - Continue TPN per pharmacy - RD will continue to monitor for needs  NUTRITION DIAGNOSIS:   Malnutrition related to chronic illness as evidenced by severe depletion of body fat, severe depletion of muscle mass. -ongoing  GOAL:   Patient will meet greater than or equal to 90% of their needs -met with TPN  MONITOR:   Weight trends, Labs, I & O's, Other (Comment) (TPN regimen)  ASSESSMENT:   81 y.o. female has a past medical history significant for diverticular disease, HTN, LVH, COPD and aortic stenosis who presented to the ED for diffuse, cramping abdominal pain that began last night after dinner.   8/17 Pt now POD #3 ex lap with lysis of adhesions. NGT was removed yesterday (8/16) afternoon. Pt currently receiving TPN at goal rate: Clinimix E 5/15 @ 60 mL/hr with 20% lipids @ 10 mL/hr which is providing 72 grams protein,  1502 kcal which is meeting needs.  Plan per pharmacy today: At 1800 today:  Increase Clinimix E 5/15 to 60 ml/hr (goal rate). (yet RD noted TPN running at this rate at time of visit this AM)  20% fat emulsion at 10 ml/hr.  TPN to contain standard multivitamins and trace elements.  Reduce IVF to 40 ml/hr.  Change insulin coverage to Novolog sensitive scale q4h  TPN lab panels on Mondays & Thursdays; f/u K/Phos repletion  Medications reviewed. Labs reviewed; CBGs: 85-142 mg/dL, Ca: 8.4 mmol/L, Phos: 1.8 mg/dL.    8/15 - New consult received for new TPN. Pt POD 1 ex lap with lysis of adhesions.  - Pt transferred from ICU today.  - She has been NPO since admission and unable to meet needs. Needs calculated during initially assessment remain appropriate although higher ranges of kcal and protein should be achieved, if possible due to recent surgery.  - Current order in place for Clinimix E 5/15 @ 40 mL/hr with 20% lipids @ 10 mL/hr which  provides 48 grams protein, 1162 kcal which does not meet needs. Plan for goal: Clinimix E 5/15 @ 60 mL/hr with 20% lipids @ 10 mL/hr to provide 72 grams protein, 1502 kcal.  - Plan per pharmacy today: At 1800 today:  Start Clinimix E 5/15 at 40 ml/hr.  20% fat emulsion at 10 ml/hr.  Plan to advance as tolerated to the goal rate.  TPN to contain standard multivitamins and trace elements.  Reduce IVF to 60 ml/hr.  Add Novolog sensitive scale q6hr  TPN lab panels on Mondays & Thursdays.  8/11 - Per pt, she drinks Ensure at home, one a day. Encouraged her to drink increase to 2-3 daily. - Per weight history, pt has lost 12 lb since 10/02/14 (12% weight loss x 10 months, insignificant for time frame).   Diet Order:  .TPN (CLINIMIX-E) Adult Diet clear liquid Room service appropriate?: Yes; Fluid consistency:: Thin .TPN (CLINIMIX-E) Adult  Skin:  Reviewed, no issues  Last BM:  PTA  Height:   Ht Readings from Last 1 Encounters:  08/11/15 4' 11" (1.499 m)    Weight:   Wt Readings from Last 1 Encounters:  08/11/15 108 lb 11 oz (49.3 kg)    Ideal Body Weight:  44.1 kg  BMI:  Body mass index is 21.94 kg/(m^2).  Estimated Nutritional Needs:   Kcal:  1250-1450  Protein:  65-75g  Fluid:  1.5L/day  EDUCATION NEEDS:   No education needs identified at this  time     Jarome Matin, RD, LDN Inpatient Clinical Dietitian Pager # [phone] After hours/weekend pager # [phone]

## 2015-08-13 NOTE — Progress Notes (Signed)
PARENTERAL NUTRITION CONSULT NOTE  Pharmacy Consult for TPN Indication: SBO  No Known Allergies  Patient Measurements: Height:  (149.9 cm) Weight: 108 lb 11 oz (49.3 kg) IBW/kg (Calculated) : 43.2  Vital Signs: Temp: 98.7 F (37.1 C) (08/17 0409) Temp Source: Oral (08/17 0409) BP: 144/47 mmHg (08/17 0409) Pulse Rate: 63 (08/17 0409) Intake/Output from previous day: 08/16 0701 - 08/17 0700 In: 3148.3 [I.V.:1439; IV Piggyback:255; TPN:1454.3] Out: 2075 [Urine:2075] Intake/Output from this shift:    Labs:  Recent Labs  08/11/15 0522 08/12/15 0420  WBC 9.0 7.3  HGB 9.8* 8.4*  HCT 30.6* 26.0*  PLT 151 128*    Recent Labs  08/11/15 0522 08/11/15 0900 08/12/15 0420  NA 144  --  140  K 4.5  --  3.5  CL 115*  --  111  CO2 20*  --  25  GLUCOSE 104*  --  155*  BUN 25*  --  20  CREATININE 1.03*  --  0.77  CALCIUM 8.7*  --  8.4*  MG  --  1.9 2.1  PHOS  --  3.2 1.8*  PROT  --   --  4.6*  ALBUMIN  --   --  2.5*  AST  --   --  17  ALT  --   --  10*  ALKPHOS  --   --  44  BILITOT  --   --  0.5  PREALBUMIN  --   --  7.6*  TRIG  --   --  87   Estimated Creatinine Clearance: 30 mL/min (by C-G formula based on Cr of 0.77).    Recent Labs  08/12/15 2003 08/12/15 2348 08/13/15 0407  GLUCAP 109* 122* 104*    Medical History: Past Medical History  Diagnosis Date  . History of atherosclerotic cardiovascular disease   . Hypertension   . Left ventricular hypertrophy   . Gastritis   . Cough   . Personal history of tobacco use, presenting hazards to health   . Obesity   . Chest pain, non-cardiac   . Allergic rhinitis   . COPD (chronic obstructive pulmonary disease)   . History of small bowel obstruction   . Chronic diastolic heart failure   . Aortic valve disease     regurge > stenosis   Medications:  Scheduled:  . antiseptic oral rinse  7 mL Mouth Rinse q12n4p  . chlorhexidine  15 mL Mouth Rinse BID  . heparin  5,000 Units Subcutaneous 3 times  per day  . insulin aspart  0-9 Units Subcutaneous 6 times per day  . sodium chloride  10-40 mL Intracatheter Q12H  . tiotropium  1 capsule Inhalation Daily   Infusions:  . [place] KitchenTPN (CLINIMIX-E) Adult 60 mL/hr at 08/12/15 1811   And  . fat emulsion 240 mL (08/12/15 1811)  . sodium chloride 60 mL/hr at 08/12/15 1112   Insulin Requirements: 4 units Novolog on 8/16  Current Nutrition: NPO  IVF: NS at 60 ml/hr  Central access:  8/14 TPN start date:  8/15  ASSESSMENT  HPI: 29 yoF admit 8/10 with abd pain, hx of diverticular disease, SBO. On 8/14 underwent Expl Lap & LOA for persistent SBO after 4 days conservative management. Begin TPN 8/15, Pharmacy to dose.  Significant events:  8/15: TPN started  Today:   Glucose: CBGs at goal < 150  Electrolytes - no new labs (K/Phos low yesterday; repleted)  Renal - wnl  LFTs - wnl  TGs - 87  Prealbumin - 7.6  NUTRITIONAL GOALS                                                                                             RD recs:  Kcal: 1250-1450 Protein: 65-75g Fluid: 1.5L/day  Clinimix E 5/15 at a goal rate of 60 ml/hr + 20% fat emulsion at 10 ml/hr to provide: 72 g/day protein, 1500 Kcal/day.  PLAN                                                                                                                         At 1800 today:  Increase Clinimix E 5/15 to 60 ml/hr (goal rate).  20% fat emulsion at 10 ml/hr.  TPN to contain standard multivitamins and trace elements.  Reduce IVF to 40 ml/hr.  Change insulin coverage to Novolog sensitive scale q4h  TPN lab panels on Mondays & Thursdays; f/u K/Phos repletion  F/u daily.  Bernadene Person, PharmD, BCPS Pager: [phone] 08/13/2015, 8:07 AM

## 2015-08-13 NOTE — Progress Notes (Signed)
[facility] PROGRESS NOTE  Desiree Hutchinson NGE:[id number] DOB: 10/07/1920 DOA: 08/05/2015 PCP: [name], MD  HPI: 81 year old patient with diverticular disease, HTN, LVH, COPD and aortic stenosis presented to the ED on 08/06/15 for diffusing, cramping abdominal pain that began on 08/05/15 after eating dinner. She denies nausea, vomiting, fever and chills. Pain fluctuates in intensity up to 8/10. She had a normal bowel movement on 08/05/15. Does note her bowel movements have been darker since taking iron supplements. She also complains of generalized weakness x 2 weeks. She has a history of 2 previous abdominal surgeries and does not have a history of colon cancer. She was recently hospitalized 7/23-7/27/16 for diverticulosis of the colon with hemorrhage and required a transfusion for anemia. Abdominal CT in the ED on 08/06/15 demonstrated findings consistent with a small bowel obstruction with no abscesses or perforation.  Patient was initially managed conservatively with NPO, NG tube and IVF, however failed to improve and she was taken to OR on 8/14. She was observed in SDU post op and transferred to floor on 8/15.  NG was successfully clamped on 8/16 and pt started on clears with diet advancing  Assessment/Plan: Small bowel obstruction - patient underwent ex lap with LOA on 8/14 per general surgery, doing well post op - NG tube successfully d/c'd and has started advancing diet.  Hypertension - PRN IV hydralazine if SBP >180   Acute Kidney Injury - Likely due to dehydration.  - Cr remains stable, monitor renal panel  COPD - Currently stable, no wheezing  Protein-Calorie Malnutrition - TPN continued, weaning as tolerated - Ultimate plans to eventually stop when reliably tolerating PO nutrition  Recent GI bleed - Monitor with serial CBCs, no evidence of bleeding currently  ABLA - post op, monitor CBC  Code Status: Full Family Communication: Pt in room Disposition Plan:  Pending  Consultants:  General Surgery  Procedures:  Ex lap with lysis of adhesion 8/14  Antibiotics:    HPI/Subjective: Complains of post-op pain.  Objective: Filed Vitals:   08/12/15 1350 08/12/15 2109 08/13/15 0409 08/13/15 1357  BP: 155/52 125/41 144/47 155/52  Pulse: 61 65 63 71  Temp: 98.6 F (37 C) 98.3 F (36.8 C) 98.7 F (37.1 C) 98.3 F (36.8 C)  TempSrc: Oral Oral Oral Oral  Resp: Height:      Weight:      SpO2: 100% 98% 100% 100%    Intake/Output Summary (Last 24 hours) at 08/13/15 1517 Last data filed at 08/13/15 1500  Gross per 24 hour  Intake 2769.5 ml  Output   1225 ml  Net 1544.5 ml   Filed Weights   08/10/15 1334 08/11/15 0400 08/11/15 1147  Weight: 47.8 kg (105 lb 6.1 oz) 49.5 kg (109 lb 2 oz) 49.3 kg (108 lb 11 oz)    Exam:   General:  Awake, sitting upright in bed, eating  Cardiovascular: regular, s1, s2  Respiratory: normal resp effort, no wheezing  Abdomen: soft, nondistended, mildly tender  Musculoskeletal: perfused, no clubbing   Data Reviewed: Basic Metabolic Panel:  Recent Labs Lab 08/07/15 0845 08/09/15 0450 08/10/15 0428 08/11/15 0522 08/11/15 0900 08/12/15 0420  NA 140 141 141 144  --  140  Hutchinson 4.4 4.5 4.2 4.5  --  3.5  CL 108 112* 113* 115*  --  111  CO2 24 19* 19* 20*  --  25  GLUCOSE 104* 74 65 104*  --  155*  BUN 17 27* 26*  25*  --  20  CREATININE 1.11* 1.01* 0.93 1.03*  --  0.77  CALCIUM 9.4 9.0 8.9 8.7*  --  8.4*  MG  --   --   --   --  1.9 2.1  PHOS  --   --   --   --  3.2 1.8*   Liver Function Tests:  Recent Labs Lab 08/10/15 0428 08/12/15 0420  AST 19 17  ALT 11* 10*  ALKPHOS 63 44  BILITOT 0.8 0.5  PROT 5.6* 4.6*  ALBUMIN 3.1* 2.5*   No results for input(s): LIPASE, AMYLASE in the last 168 hours. No results for input(s): AMMONIA in the last 168 hours. CBC:  Recent Labs Lab 08/07/15 0845 08/10/15 0428 08/11/15 0522 08/12/15 0420  WBC 8.1 4.5 9.0 7.3  HGB 12.3  10.0* 9.8* 8.4*  HCT 37.7 31.1* 30.6* 26.0*  MCV 95.4 96.3 93.9 93.5  PLT 228 157 151 128*   Cardiac Enzymes: No results for input(s): CKTOTAL, CKMB, CKMBINDEX, TROPONINI in the last 168 hours. BNP (last 3 results) No results for input(s): BNP in the last 8760 hours.  ProBNP (last 3 results) No results for input(s): PROBNP in the last 8760 hours.  CBG:  Recent Labs Lab 08/12/15 2003 08/12/15 2348 08/13/15 0407 08/13/15 0809 08/13/15 1137  GLUCAP 109* 122* 104* 115* 103*    Recent Results (from the past 240 hour(s))  Surgical pcr screen     Status: None   Collection Time: 08/10/15  9:40 AM  Result Value Ref Range Status   MRSA, PCR NEGATIVE NEGATIVE Final   Staphylococcus aureus NEGATIVE NEGATIVE Final    Comment:        The Xpert SA Assay (FDA approved for NASAL specimens in patients over 56 years of age), is one component of a comprehensive surveillance program.  Test performance has been validated by [facility] for patients greater than or equal to 55 year old. It is not intended to diagnose infection nor to guide or monitor treatment.      Studies: No results found.  Scheduled Meds: . antiseptic oral rinse  7 mL Mouth Rinse q12n4p  . chlorhexidine  15 mL Mouth Rinse BID  . heparin  5,000 Units Subcutaneous 3 times per day  . insulin aspart  0-9 Units Subcutaneous 6 times per day  . sodium chloride  10-40 mL Intracatheter Q12H  . tiotropium  1 capsule Inhalation Daily   Continuous Infusions: . [place] KitchenTPN (CLINIMIX-E) Adult 60 mL/hr at 08/12/15 1811   And  . fat emulsion 240 mL (08/12/15 1811)  . [place] KitchenTPN (CLINIMIX-E) Adult     And  . fat emulsion    . sodium chloride 40 mL/hr at 08/13/15 9604    Principal Problem:   SBO (small bowel obstruction) Active Problems:   Essential hypertension   Protein-calorie malnutrition   AKI (acute kidney injury)   Desiree Hutchinson  [facility] Pager [phone]. If 7PM-7AM, please contact night-coverage at  [email or web address], password [facility] 08/13/2015, 3:17 PM  LOS: 7 days

## 2015-08-13 NOTE — Progress Notes (Signed)
Physical Therapy Treatment Patient Details Name: Desiree Hutchinson MRN: [id number] DOB: [birth year] Today's Date: 08/13/2015    History of Present Illness 81 yo female adm with SBO; PMHx:HTN; s/p exp lap with lysis of adhesions for SBO 08/10/15    PT Comments    Mod assist for mobility. Increased time and effort for all tasks. Max encouragement for participation. Pt c/o significant pain during session (medicated prior to session per RN). Poor mobility on today compared to last session. Updated d/c recommendations-now recommending ST [facility]hab at [facility].   Follow Up Recommendations  SNF;Supervision/Assistance - 24 hour (unless mobility improves significantly)     Equipment Recommendations  Rolling walker with 5" wheels (youth)    Recommendations for Other Services       Precautions / Restrictions Precautions Precautions: Fall Precaution Comments: abd surgery-logroll Restrictions Weight Bearing Restrictions: No    Mobility  Bed Mobility Overal bed mobility: Needs Assistance Bed Mobility: Rolling;Sidelying to Sit Rolling: Min assist Sidelying to sit: Mod assist       General bed mobility comments: cues for log roll technique with physical assist for LE management and to bring trunk to upright. Increased time  Transfers Overall transfer level: Needs assistance Equipment used: Rolling walker (2 wheeled) Transfers: Sit to/from Stand Sit to Stand: Mod assist;From elevated surface         General transfer comment: Assist to rise, stabilize, control descent. Multimodal cues for safety, technique, hand placement. Max encouragement for progression beyond standing on today.  Ambulation/Gait Ambulation/Gait assistance: Min assist Ambulation Distance (Feet): 4 Feet Assistive device: Rolling walker (2 wheeled) Gait Pattern/deviations: Decreased stride length;Trunk flexed;Step-through pattern;Decreased step length - right;Decreased step length - left     General Gait Details: Cues for  safety, posture, distance from RW. Pt maintaining flexed trunk and knee posture.Assist to stabilize and maneuver with RW. Pt reporting she felt like she would pass out so brought recliner up behind pt to sit.    Stairs            Wheelchair Mobility    Modified Rankin (Stroke Patients Only)       Balance Overall balance assessment: Needs assistance         Standing balance support: Bilateral upper extremity supported;During functional activity Standing balance-Leahy Scale: Poor                      Cognition Arousal/Alertness: Awake/Hutchinson Behavior During Therapy: WFL for tasks assessed/performed Overall Cognitive Status: Within Functional Limits for tasks assessed                      Exercises      General Comments        Pertinent Vitals/Pain Pain Assessment: Faces Pain Score: 8  Pain Location: abdomen Pain Descriptors / Indicators: Sore;Grimacing;Guarding Pain Intervention(s): Monitored during session;Repositioned    Home Living                      Prior Function            PT Goals (current goals can now be found in the care plan section) Progress towards PT goals: Progressing toward goals (very slowly)    Frequency  Min 3X/week    PT Plan Discharge plan needs to be updated    Co-evaluation             End of Session   Activity Tolerance: Patient limited by fatigue;Patient limited by pain Patient left:  in chair;with call bell/phone within reach     Time: 1506-1529 PT Time Calculation (min) (ACUTE ONLY): 23 min  Charges:  $Gait Training: 8-22 mins $Therapeutic Activity: 8-22 mins                    G Codes:      Desiree Hutchinson, MPT Pager: [phone]

## 2015-08-13 NOTE — Progress Notes (Signed)
Patient ID: Desiree Hutchinson, female   DOB: 1920/09/02, 81 y.o.   MRN: [id number]     [facility]      [address] [place], [place] [address]    Phone: [phone] FAX: [phone]     Subjective: Passing flatus.  Tolerating clears. No dysuria.   Objective:  Vital signs:  Filed Vitals:   08/12/15 0510 08/12/15 1350 08/12/15 2109 08/13/15 0409  BP: 146/64 155/52 125/41 144/47  Pulse: 70 61 65 63  Temp: 98.4 F (36.9 C) 98.6 F (37 C) 98.3 F (36.8 C) 98.7 F (37.1 C)  TempSrc: Oral Oral Oral Oral  Resp: _0 Height:      Weight:      SpO2: 95% 100% 98% 100%    Last BM Date: 08/03/15  Intake/Output   Yesterday:  08/16 0701 - 08/17 0700 In: 3148.3 [I.V.:1439; IV Piggyback:255; IFO:2774.1] Out: 2075 [Urine:2075] This shift:  Total I/O In: -  Out: 300 [Urine:300]   Physical Exam: General: Pt awake/alert/oriented x4 in no acute distress  Abdomen: Soft. Nondistended. Tender over incision,, staples in place, no erythema or drainage. No evidence of peritonitis. No incarcerated hernias.   Problem List:   Principal Problem:   SBO (small bowel obstruction) Active Problems:   Essential hypertension   Protein-calorie malnutrition   AKI (acute kidney injury)    Results:   Labs: Results for orders placed or performed during the hospital encounter of 08/05/15 (from the past 11 hour(s))  Glucose, capillary     Status: None   Collection Time: 08/11/15  2:35 PM  Result Value Ref Range   Glucose-Capillary 85 65 - 99 mg/dL  Glucose, capillary     Status: None   Collection Time: 08/11/15  6:17 PM  Result Value Ref Range   Glucose-Capillary 92 65 - 99 mg/dL  Glucose, capillary     Status: Abnormal   Collection Time: 08/11/15 11:54 PM  Result Value Ref Range   Glucose-Capillary 138 (H) 65 - 99 mg/dL  Comprehensive metabolic panel     Status: Abnormal   Collection Time: 08/12/15  4:20 AM  Result Value Ref  Range   Sodium 140 135 - 145 mmol/L   Potassium 3.5 3.5 - 5.1 mmol/L    Comment: RESULT REPEATED AND VERIFIED DELTA CHECK NOTED    Chloride 111 101 - 111 mmol/L   CO2 25 22 - 32 mmol/L   Glucose, Bld 155 (H) 65 - 99 mg/dL   BUN 20 6 - 20 mg/dL   Creatinine, Ser 0.77 0.44 - 1.00 mg/dL   Calcium 8.4 (L) 8.9 - 10.3 mg/dL   Total Protein 4.6 (L) 6.5 - 8.1 g/dL   Albumin 2.5 (L) 3.5 - 5.0 g/dL   AST 17 15 - 41 U/L   ALT 10 (L) 14 - 54 U/L   Alkaline Phosphatase 44 38 - 126 U/L   Total Bilirubin 0.5 0.3 - 1.2 mg/dL   GFR calc non Af Amer >60 >60 mL/min   GFR calc Af Amer >60 >60 mL/min    Comment: (NOTE) The eGFR has been calculated using the CKD EPI equation. This calculation has not been validated in all clinical situations. eGFR's persistently <60 mL/min signify possible Chronic Kidney Disease.    Anion gap 4 (L) 5 - 15  Prealbumin     Status: Abnormal   Collection Time: 08/12/15  4:20 AM  Result Value Ref Range   Prealbumin 7.6 (L)  18 - 38 mg/dL    Comment: Performed at [facility]  Magnesium     Status: None   Collection Time: 08/12/15  4:20 AM  Result Value Ref Range   Magnesium 2.1 1.7 - 2.4 mg/dL  Phosphorus     Status: Abnormal   Collection Time: 08/12/15  4:20 AM  Result Value Ref Range   Phosphorus 1.8 (L) 2.5 - 4.6 mg/dL  Triglycerides     Status: None   Collection Time: 08/12/15  4:20 AM  Result Value Ref Range   Triglycerides 87 <150 mg/dL    Comment: Performed at [facility]  CBC     Status: Abnormal   Collection Time: 08/12/15  4:20 AM  Result Value Ref Range   WBC 7.3 4.0 - 10.5 K/uL   RBC 2.78 (L) 3.87 - 5.11 MIL/uL   Hemoglobin 8.4 (L) 12.0 - 15.0 g/dL   HCT 26.0 (L) 36.0 - 46.0 %   MCV 93.5 78.0 - 100.0 fL   MCH 30.2 26.0 - 34.0 pg   MCHC 32.3 30.0 - 36.0 g/dL   RDW 15.1 11.5 - 15.5 %   Platelets 128 (L) 150 - 400 K/uL  Glucose, capillary     Status: Abnormal   Collection Time: 08/12/15  6:22 AM  Result Value Ref Range    Glucose-Capillary 142 (H) 65 - 99 mg/dL  Glucose, capillary     Status: Abnormal   Collection Time: 08/12/15 12:04 PM  Result Value Ref Range   Glucose-Capillary 141 (H) 65 - 99 mg/dL  Glucose, capillary     Status: Abnormal   Collection Time: 08/12/15  4:28 PM  Result Value Ref Range   Glucose-Capillary 121 (H) 65 - 99 mg/dL  Glucose, capillary     Status: Abnormal   Collection Time: 08/12/15  8:03 PM  Result Value Ref Range   Glucose-Capillary 109 (H) 65 - 99 mg/dL  Glucose, capillary     Status: Abnormal   Collection Time: 08/12/15 11:48 PM  Result Value Ref Range   Glucose-Capillary 122 (H) 65 - 99 mg/dL  Glucose, capillary     Status: Abnormal   Collection Time: 08/13/15  4:07 AM  Result Value Ref Range   Glucose-Capillary 104 (H) 65 - 99 mg/dL  Glucose, capillary     Status: Abnormal   Collection Time: 08/13/15  8:09 AM  Result Value Ref Range   Glucose-Capillary 115 (H) 65 - 99 mg/dL    Imaging / Studies: No results found.  Medications / Allergies:  Scheduled Meds: . antiseptic oral rinse  7 mL Mouth Rinse q12n4p  . chlorhexidine  15 mL Mouth Rinse BID  . heparin  5,000 Units Subcutaneous 3 times per day  . insulin aspart  0-9 Units Subcutaneous 6 times per day  . sodium chloride  10-40 mL Intracatheter Q12H  . tiotropium  1 capsule Inhalation Daily   Continuous Infusions: . [place] KitchenTPN (CLINIMIX-E) Adult 60 mL/hr at 08/12/15 1811   And  . fat emulsion 240 mL (08/12/15 1811)  . [place] KitchenTPN (CLINIMIX-E) Adult     And  . fat emulsion    . sodium chloride 40 mL/hr at 08/13/15 0952   PRN Meds:.acetaminophen, hydrALAZINE, iohexol, morphine injection, ondansetron, sodium chloride  Antibiotics: Anti-infectives    Start     Dose/Rate Route Frequency Ordered Stop   08/10/15 0931  cefOXitin (MEFOXIN) 2 g in dextrose 5 % 50 mL IVPB     2 g 100 mL/hr over 30 Minutes Intravenous  30 min pre-op 08/10/15 8264 08/10/15 1039       Assessment/Plan SBO POD#3 exploratory laparotomy,  LOA---Dr. Excell Seltzer  -advance to fulls -up to air, mobilize, PT -IS and pain control, add norco PCM-continue TPN, if able to tolerate fulls today, wean to DC tomorrow VTE prophylaxis-SCD/heparin HTN-on PRN hydralazine  COPD-pulmonary toilet Aortic stenosis Anemia-stable h&h Dispo-advancing diet.  PT recs HH PT, supervision   Erby Pian, ANP-BC [facility]   08/13/2015 11:03 AM  Agree with above. She seems to be progressing well.  Alphonsa Overall, MD, [facility] Pager: [phone] Office phone:  [phone]

## 2015-08-14 LAB — COMPREHENSIVE METABOLIC PANEL
ALT: 11 U/L — ABNORMAL LOW (ref 14–54)
AST: 23 U/L (ref 15–41)
Albumin: 2.6 g/dL — ABNORMAL LOW (ref 3.5–5.0)
Alkaline Phosphatase: 42 U/L (ref 38–126)
Anion gap: 6 (ref 5–15)
BUN: 21 mg/dL — ABNORMAL HIGH (ref 6–20)
CO2: 26 mmol/L (ref 22–32)
Calcium: 8.5 mg/dL — ABNORMAL LOW (ref 8.9–10.3)
Chloride: 107 mmol/L (ref 101–111)
Creatinine, Ser: 0.69 mg/dL (ref 0.44–1.00)
GFR calc Af Amer: >60 mL/min (ref >60)
GFR calc non Af Amer: >60 mL/min (ref >60)
Glucose, Bld: 105 mg/dL — ABNORMAL HIGH (ref 65–99)
Potassium: 3.6 mmol/L (ref 3.5–5.1)
Sodium: 139 mmol/L (ref 135–145)
Total Bilirubin: 0.4 mg/dL (ref 0.3–1.2)
Total Protein: 5 g/dL — ABNORMAL LOW (ref 6.5–8.1)

## 2015-08-14 LAB — GLUCOSE, CAPILLARY
Glucose-Capillary: 100 mg/dL — ABNORMAL HIGH (ref 65–99)
Glucose-Capillary: 101 mg/dL — ABNORMAL HIGH (ref 65–99)
Glucose-Capillary: 103 mg/dL — ABNORMAL HIGH (ref 65–99)
Glucose-Capillary: 107 mg/dL — ABNORMAL HIGH (ref 65–99)
Glucose-Capillary: 117 mg/dL — ABNORMAL HIGH (ref 65–99)
Glucose-Capillary: 128 mg/dL — ABNORMAL HIGH (ref 65–99)

## 2015-08-14 LAB — MAGNESIUM: Magnesium: 2 mg/dL (ref 1.7–2.4)

## 2015-08-14 LAB — PHOSPHORUS: Phosphorus: 3.1 mg/dL (ref 2.5–4.6)

## 2015-08-14 MED ORDER — FAT EMULSION 20 % IV EMUL
240.0000 mL | INTRAVENOUS | Status: AC
  Administered 2015-08-14: 240 mL via INTRAVENOUS
  Filled 2015-08-14: qty 250

## 2015-08-14 MED ORDER — TRACE MINERALS CR-CU-MN-SE-ZN 10-1000-500-60 MCG/ML IV SOLN
INTRAVENOUS | Status: AC
  Administered 2015-08-14: 17:00:00 via INTRAVENOUS
  Filled 2015-08-14: qty 1440

## 2015-08-14 MED ORDER — BOOST / RESOURCE BREEZE PO LIQD
1.0000 | Freq: Three times a day (TID) | ORAL | Status: DC
  Administered 2015-08-14 – 2015-08-22 (×12): 1 via ORAL

## 2015-08-14 NOTE — Progress Notes (Signed)
General Surgery Note  LOS: 8 days  POD -  4 Days Post-Op  Assessment/Plan: 1.  SMALL BOWEL OBSTRUCTION, EXPLORATORY LAPAROTOMY, LYSIS OF ADHESION - 08/10/2015 - [place]  The patient does not like the choices on full liquids, so I will advance to reg diet.  But her po intake is not good and she has not had full bowel recovery.  I would continue TPN for now.  2.  HTN 3.  COPD 4.  Aortic stenosis 5.  DVT prophylaxis - Heparin SQ 6.  Anemia -  Hgb - 8.4 - 08/12/2015 7.  Malnutrition - on TPN  I would continue this for now, despite on diet.  I don't think that she is eating that much. 8.  Limited physical ability.  She is a two person assist to get out of bed.   Principal Problem:   SBO (small bowel obstruction) Active Problems:   Essential hypertension   Protein-calorie malnutrition   AKI (acute kidney injury)   Subjective:  Still sore.  Passing some flatus, but no BM.  In room by self. Objective:   Filed Vitals:   08/14/15 0424  BP: 148/54  Pulse: 78  Temp: 98.5 F (36.9 C)  Resp: 20     Intake/Output from previous day:  08/17 0701 - 08/18 0700 In: 1196.8 [I.V.:368.7; TPN:828.2] Out: 1250 [Urine:1250]  Intake/Output this shift:  Total I/O In: -  Out: 125 [Urine:125]   Physical Exam:   General: Thin older AA F who is alert and oriented.    HEENT: Normal. Pupils equal. .   Lungs: Clear.  Pulls 1,000 cc on IS   Abdomen: Mild distention.  Has BS.   Wound: Looks clean.   Rectal:  Some stool.  No impaction.   Lab Results:     Recent Labs  08/12/15 0420  WBC 7.3  HGB 8.4*  HCT 26.0*  PLT 128*    BMET    Recent Labs  08/12/15 0420 08/14/15 0520  NA 140 139  K 3.5 3.6  CL 111 107  CO2 25 26  GLUCOSE 155* 105*  BUN 20 21*  CREATININE 0.77 0.69  CALCIUM 8.4* 8.5*    PT/INR  No results for input(s): LABPROT, INR in the last 72 hours.  ABG  No results for input(s): PHART, HCO3 in the last 72 hours.  Invalid input(s): PCO2,  PO2   Studies/Results:  No results found.   Anti-infectives:   Anti-infectives    Start     Dose/Rate Route Frequency Ordered Stop   08/10/15 0931  cefOXitin (MEFOXIN) 2 g in dextrose 5 % 50 mL IVPB     2 g 100 mL/hr over 30 Minutes Intravenous 30 min pre-op 08/10/15 0929 08/10/15 1039      Ovidio Kin, MD, FACS Pager: [phone] [facility] Office: [phone] 08/14/2015

## 2015-08-14 NOTE — Care Management Important Message (Signed)
Important Message  Patient Details  Name: Desiree Hutchinson MRN: [id number] Date of Birth: [birth year]   Medicare Important Message Given:  Yes-third notification given    Haskell Flirt 08/14/2015, 1:06 PMImportant Message  Patient Details  Name: Desiree Hutchinson MRN: [id number] Date of Birth: 09-20-1920   Medicare Important Message Given:  Yes-third notification given    Haskell Flirt 08/14/2015, 1:06 PM

## 2015-08-14 NOTE — Progress Notes (Signed)
PARENTERAL NUTRITION CONSULT NOTE  Pharmacy Consult for TPN Indication: SBO  No Known Allergies  Patient Measurements: Height:  (149.9 cm) Weight: 108 lb 11 oz (49.3 kg) IBW/kg (Calculated) : 43.2  Vital Signs: Temp: 98.5 F (36.9 C) (08/Desiree 0424) Temp Source: Oral (08/Desiree 0424) BP: 148/54 mmHg (08/Desiree 0424) Pulse Rate: 78 (08/Desiree 0424) Intake/Output from previous day: 08/17 0701 - 08/Desiree 0700 In: 1196.8 [I.V.:368.7; TPN:828.2] Out: 1500 [Urine:1500] Intake/Output from this shift: Total I/O In: -  Out: 125 [Urine:125]  Labs:  Recent Labs  08/12/15 0420  WBC 7.3  HGB 8.4*  HCT 26.0*  PLT 128*    Recent Labs  08/11/15 0900 08/12/15 0420 08/Desiree/16 0520  NA  --  140 139  K  --  3.5 3.6  CL  --  111 107  CO2  --  25 26  GLUCOSE  --  155* 105*  BUN  --  20 21*  CREATININE  --  0.77 0.69  CALCIUM  --  8.4* 8.5*  MG 1.9 2.1 2.0  PHOS 3.2 1.8* 3.1  PROT  --  4.6* 5.0*  ALBUMIN  --  2.5* 2.6*  AST  --  17 23  ALT  --  10* 11*  ALKPHOS  --  44 42  BILITOT  --  0.5 0.4  PREALBUMIN  --  7.6*  --   TRIG  --  87  --    Estimated Creatinine Clearance: 30 mL/min (by C-G formula based on Cr of 0.69).    Recent Labs  08/Desiree/16 0009 08/Desiree/16 0421 08/Desiree/16 0757  GLUCAP 100* 101* 107*    Medical History: Past Medical History  Diagnosis Date  . History of atherosclerotic cardiovascular disease   . Hypertension   . Left ventricular hypertrophy   . Gastritis   . Cough   . Personal history of tobacco use, presenting hazards to health   . Obesity   . Chest pain, non-cardiac   . Allergic rhinitis   . COPD (chronic obstructive pulmonary disease)   . History of small bowel obstruction   . Chronic diastolic heart failure   . Aortic valve disease     regurge > stenosis   Medications:  Scheduled:  . antiseptic oral rinse  7 mL Mouth Rinse q12n4p  . chlorhexidine  15 mL Mouth Rinse BID  . heparin  5,000 Units Subcutaneous 3 times per day  . insulin aspart   0-9 Units Subcutaneous 6 times per day  . sodium chloride  10-40 mL Intracatheter Q12H  . tiotropium  1 capsule Inhalation Daily   Infusions:  . Marland KitchenTPN (CLINIMIX-E) Adult 60 mL/hr at 08/13/15 1741   And  . fat emulsion 240 mL (08/13/15 1742)  . sodium chloride 40 mL/hr at 08/Desiree/16 0050   Insulin Requirements: 2 units Novolog on 8/17  Current Nutrition: full liquid, advancing to regular today  IVF: NS at 40 ml/hr  Central access:  8/14 TPN start date:  8/15  ASSESSMENT  HPI: Desiree Hutchinson admit 8/10 with abd pain, hx of diverticular disease, SBO. On 8/14 underwent Expl Lap & LOA for persistent SBO after 4 days conservative management. Begin TPN 8/15, Pharmacy to dose.  Significant events:  8/15: TPN started 8/Desiree: Didn't tolerate full liquids that well (didn't like menu items), so going to regular diet.  Today:   Glucose: CBGs at goal < 150  Electrolytes: all wnl  Renal: slight bump in SCr on 8/9 resolved; CrCl 30 d/t advanced age and low weight.  LFTs: wnl  TGs: 87  Prealbumin: 7.6  NUTRITIONAL GOALS                                                                                             RD recs:  Kcal: 1250-1450 Protein: 65-75g Fluid: 1.5L/day  Clinimix E 5/15 at a goal rate of 60 ml/hr + 20% fat emulsion at 10 ml/hr to provide: 72 g/day protein, 1500 Kcal/day.  PLAN                                                                                                                         At 1800 today:  Continue Clinimix E 5/15 at 60 ml/hr (goal rate).  Intake was poor yesterday and bowel function not fully returned, so MD wants to continue full TPN for now.  20% fat emulsion at 10 ml/hr.  TPN to contain standard multivitamins and trace elements.  Continue IVF at 40 ml/hr.  Continue insulin coverage with Novolog sensitive scale q4h  TPN lab panels on Mondays  & Thursdays; f/u K/Phos repletion  F/u daily.  Bernadene Person, PharmD, BCPS Pager: [phone] 8/Desiree/2016, 8:58 AM

## 2015-08-14 NOTE — Clinical Social Work Note (Signed)
Clinical Social Work Assessment  Patient Details  Name: Desiree Hutchinson MRN: [id number] Date of Birth: 12/03/1920  Date of referral:  08/14/15               Reason for consult:  Facility Placement                Permission sought to share information with:  Facility Industrial/product designer granted to share information::  Yes, Verbal Permission Granted  Name::        Agency::     Relationship::     Contact Information:     Housing/Transportation Living arrangements for the past 2 months:  Single Family Home Source of Information:  Patient, Other (Comment Required) (patient's grandaughter, Desiree Hutchinson) Patient Interpreter Needed:  None Criminal Activity/Legal Involvement Pertinent to Current Situation/Hospitalization:  No - Comment as needed Significant Relationships:  Adult Children, Other Family Members Lives with:  Other (Comment) (patient's grandaughter, Desiree Hutchinson) Do you feel safe going back to the place where you live?  No Need for family participation in patient care:  Yes (Comment)  Care giving concerns:  CSW reviewed PT evaluation recommending SNF at discharge.    Social Worker assessment / plan:  CSW spoke with patient & grandaughter, Desiree Hutchinson at bedside - they are both agreeable with plan for SNF at discharge.   Employment status:  Retired Health and safety inspector:  Medicare PT Recommendations:  Skilled Nursing Facility Information / Referral to community resources:  Skilled Nursing Facility  Patient/Family's Response to care:  Patient & grandaughter are agreeable with plan for SNF - family is familiar with SNFs as they placed another family member in one a year ago and they are requesting [facility]. CSW confirmed with Olegario Messier at [facility] that they would be able to take patient when stable.   Patient/Family's Understanding of and Emotional Response to Diagnosis, Current Treatment, and Prognosis:  Patient's grandaughter is pleased to know we are  working towards discharge.   Emotional Assessment Appearance:  Appears younger than stated age Attitude/Demeanor/Rapport:    Affect (typically observed):  Accepting, Adaptable, Calm, Happy, Pleasant, Quiet Orientation:  Oriented to Self, Oriented to Place, Oriented to  Time, Oriented to Situation Alcohol / Substance use:    Psych involvement (Current and /or in the community):     Discharge Needs  Concerns to be addressed:    Readmission within the last 30 days:    Current discharge risk:    Barriers to Discharge:      Arlyss Repress, LCSW 08/14/2015, 11:06 AM

## 2015-08-14 NOTE — Clinical Social Work Placement (Signed)
   CLINICAL SOCIAL WORK PLACEMENT  NOTE  Date:  08/14/2015  Patient Details  Name: Desiree Hutchinson MRN: [id number] Date of Birth: 04-25-1920  Clinical Social Work is seeking post-discharge placement for this patient at the Skilled  Nursing Facility level of care (*CSW will initial, date and re-position this form in  chart as items are completed):  Yes   Patient/family provided with [facility] Clinical Social Work Department's list of facilities offering this level of care within the geographic area requested by the patient (or if unable, by the patient's family).  Yes   Patient/family informed of their freedom to choose among providers that offer the needed level of care, that participate in Medicare, Medicaid or managed care program needed by the patient, have an available bed and are willing to accept the patient.  Yes   Patient/family informed of [facility]'s ownership interest in [facility] and [facility], as well as of the fact that they are under no obligation to receive care at these facilities.  PASRR submitted to EDS on 08/14/15     PASRR number received on 08/14/15     Existing PASRR number confirmed on       FL2 transmitted to all facilities in geographic area requested by pt/family on 08/14/15     FL2 transmitted to all facilities within larger geographic area on       Patient informed that his/her managed care company has contracts with or will negotiate with certain facilities, including the following:        Yes   Patient/family informed of bed offers received.  Patient chooses bed at [facility]     Physician recommends and patient chooses bed at      Patient to be transferred to [facility] on  .  Patient to be transferred to facility by       Patient family notified on   of transfer.  Name of family member notified:        PHYSICIAN       Additional Comment:    _______________________________________________ Arlyss Repress, LCSW 08/14/2015, 11:10 AM

## 2015-08-14 NOTE — Progress Notes (Signed)
[facility] PROGRESS NOTE  Desiree Hutchinson:[id number] DOB: 1920-11-04 DOA: 08/05/2015 PCP: [name], MD  HPI: 81 year old patient with diverticular disease, HTN, LVH, COPD and aortic stenosis presented to the ED on 08/06/15 for diffusing, cramping abdominal pain that began on 08/05/15 after eating dinner. She denies nausea, vomiting, fever and chills. Pain fluctuates in intensity up to 8/10. She had a normal bowel movement on 08/05/15. Does note her bowel movements have been darker since taking iron supplements. She also complains of generalized weakness x 2 weeks. She has a history of 2 previous abdominal surgeries and does not have a history of colon cancer. She was recently hospitalized 7/23-7/27/16 for diverticulosis of the colon with hemorrhage and required a transfusion for anemia. Abdominal CT in the ED on 08/06/15 demonstrated findings consistent with a small bowel obstruction with no abscesses or perforation.  Patient was initially managed conservatively with NPO, NG tube and IVF, however failed to improve and she was taken to OR on 8/14. She was observed in SDU post op and transferred to floor on 8/15.  NG was successfully clamped on 8/16 and pt started on clears with diet advancing.  Assessment/Plan: Small bowel obstruction - patient underwent ex lap with LOA on 8/14 per general surgery, doing well post op - NG tube successfully d/c'd with diet being advanced - Patient reports continued pain with food. Pos BS.  Hypertension - PRN IV hydralazine if SBP >180   Acute Kidney Injury - Likely due to dehydration.  - Cr remains stable, monitor renal panel  COPD - Currently remains stable, no wheezing  Protein-Calorie Malnutrition - TPN is continued as pt is not tolerating much PO - Ultimate plans to eventually stop when reliably tolerating PO nutrition  Recent GI bleed - Monitor with serial CBCs, no evidence of bleeding currently  ABLA - post op, monitor CBC  Code  Status: Full Family Communication: Pt in room Disposition Plan: Pending  Consultants:  General Surgery  Procedures:  Ex lap with lysis of adhesion 8/14  Antibiotics:    HPI/Subjective: Patient reports abd pain with eating.   Objective: Filed Vitals:   08/13/15 2034 08/14/15 0424 08/14/15 0944 08/14/15 1421  BP: 152/58 148/54  156/69  Pulse: 75 78 91 102  Temp: 97.8 F (36.6 C) 98.5 F (36.9 C)  97.7 F (36.5 C)  TempSrc: Oral Oral  Oral  Resp: Height:      Weight:      SpO2: 99% 98% 96% 100%    Intake/Output Summary (Last 24 hours) at 08/14/15 1711 Last data filed at 08/14/15 1355  Gross per 24 hour  Intake 431.49 ml  Output   1625 ml  Net -1193.51 ml   Filed Weights   08/10/15 1334 08/11/15 0400 08/11/15 1147  Weight: 47.8 kg (105 lb 6.1 oz) 49.5 kg (109 lb 2 oz) 49.3 kg (108 lb 11 oz)    Exam:   General:  Awake, sitting in bed attempting to eat  Cardiovascular: regular, s1, s2  Respiratory: normal resp effort, no wheezing  Abdomen: soft, nondistended, tender mainly on R, pos BS  Musculoskeletal: perfused, no clubbing   Data Reviewed: Basic Metabolic Panel:  Recent Labs Lab 08/09/15 0450 08/10/15 0428 08/11/15 0522 08/11/15 0900 08/12/15 0420 08/14/15 0520  NA 141 141 144  --  140 139  K 4.5 4.2 4.5  --  3.5 3.6  CL 112* 113* 115*  --  111 107  CO2 19* 19* 20*  --  25 26  GLUCOSE 74 65 104*  --  155* 105*  BUN 27* 26* 25*  --  20 21*  CREATININE 1.01* 0.93 1.03*  --  0.77 0.69  CALCIUM 9.0 8.9 8.7*  --  8.4* 8.5*  MG  --   --   --  1.9 2.1 2.0  PHOS  --   --   --  3.2 1.8* 3.1   Liver Function Tests:  Recent Labs Lab 08/10/15 0428 08/12/15 0420 08/14/15 0520  AST 19 17 23   ALT 11* 10* 11*  ALKPHOS 63 44 42  BILITOT 0.8 0.5 0.4  PROT 5.6* 4.6* 5.0*  ALBUMIN 3.1* 2.5* 2.6*   No results for input(s): LIPASE, AMYLASE in the last 168 hours. No results for input(s): AMMONIA in the last 168 hours. CBC:  Recent  Labs Lab 08/10/15 0428 08/11/15 0522 08/12/15 0420  WBC 4.5 9.0 7.3  HGB 10.0* 9.8* 8.4*  HCT 31.1* 30.6* 26.0*  MCV 96.3 93.9 93.5  PLT 157 151 128*   Cardiac Enzymes: No results for input(s): CKTOTAL, CKMB, CKMBINDEX, TROPONINI in the last 168 hours. BNP (last 3 results) No results for input(s): BNP in the last 8760 hours.  ProBNP (last 3 results) No results for input(s): PROBNP in the last 8760 hours.  CBG:  Recent Labs Lab 08/13/15 2030 08/14/15 0009 08/14/15 0421 08/14/15 0757 08/14/15 1122  GLUCAP 106* 100* 101* 107* 103*    Recent Results (from the past 240 hour(s))  Surgical pcr screen     Status: None   Collection Time: 08/10/15  9:40 AM  Result Value Ref Range Status   MRSA, PCR NEGATIVE NEGATIVE Final   Staphylococcus aureus NEGATIVE NEGATIVE Final    Comment:        The Xpert SA Assay (FDA approved for NASAL specimens in patients over 84 years of age), is one component of a comprehensive surveillance program.  Test performance has been validated by [facility] for patients greater than or equal to 65 year old. It is not intended to diagnose infection nor to guide or monitor treatment.      Studies: No results found.  Scheduled Meds: . antiseptic oral rinse  7 mL Mouth Rinse q12n4p  . chlorhexidine  15 mL Mouth Rinse BID  . feeding supplement  1 Container Oral TID BM  . heparin  5,000 Units Subcutaneous 3 times per day  . insulin aspart  0-9 Units Subcutaneous 6 times per day  . sodium chloride  10-40 mL Intracatheter Q12H  . tiotropium  1 capsule Inhalation Daily   Continuous Infusions: . Marland KitchenTPN (CLINIMIX-E) Adult 60 mL/hr at 08/13/15 1741   And  . fat emulsion 240 mL (08/13/15 1742)  . Marland KitchenTPN (CLINIMIX-E) Adult     And  . fat emulsion    . sodium chloride 40 mL/hr at 08/14/15 0050    Principal Problem:   SBO (small bowel obstruction) Active Problems:   Essential hypertension   Protein-calorie malnutrition   AKI (acute kidney  injury)   CHIU, STEPHEN K  [facility] Pager [phone]. If 7PM-7AM, please contact night-coverage at [email or web address], password [facility] 08/14/2015, 5:11 PM  LOS: 8 days

## 2015-08-14 NOTE — Care Management Note (Signed)
Case Management Note  Patient Details  Name: Desiree Hutchinson MRN: [id number] Date of Birth: 05-14-1920  Subjective/Objective:  Will screen for LTAC.MD notified.                  Action/Plan:   Expected Discharge Date:   (UNKNOWN)               Expected Discharge Plan:  Home w Home Health Services  In-House Referral:  NA  Discharge planning Services  CM Consult  Post Acute Care Choice:  NA Choice offered to:  Patient  DME Arranged:  N/A DME Agency:  NA  HH Arranged:    HH Agency:  NA  Status of Service:  In process, will continue to follow  Medicare Important Message Given:  Yes-third notification given Date Medicare IM Given:    Medicare IM give by:    Date Additional Medicare IM Given:    Additional Medicare Important Message give by:     If discussed at Long Length of Stay Meetings, dates discussed:    Additional Comments:  Lanier Clam, RN 08/14/2015, 1:51 PM

## 2015-08-14 NOTE — Progress Notes (Signed)
Nutrition Follow-up  DOCUMENTATION CODES:   Severe malnutrition in context of chronic illness  INTERVENTION:  - Continue TPN/TPN wean per pharmacy and surgical decisions - Will order Boost Breeze TID, each supplement provides 250 kcal and 9 grams of protein - RD will continue to monitor for needs  NUTRITION DIAGNOSIS:   Malnutrition related to chronic illness as evidenced by severe depletion of body fat, severe depletion of muscle mass. -ongoing  GOAL:   Patient will meet greater than or equal to 90% of their needs -met with TPN  MONITOR:   Weight trends, Labs, I & O's, Other (Comment) (TPN regimen)  ASSESSMENT:   81 y.o. female has a past medical history significant for diverticular disease, HTN, LVH, COPD and aortic stenosis who presented to the ED for diffuse, cramping abdominal pain that began last night after dinner.   8/18 Pt continues with Clinimix 5/15 @ 60 mL/hr with 20% lipids @ 10 mL/hr which is providing 72 grams protein, 1502 kcal which is meeting needs.  Diet advancement as follows: 8/16 @ 8338: CLD 8/17 @ 1104: FLD 8/18 @ 0916: Regular  Per discussion with pharmacist, pt not taking much PO on liquid diets; diet advanced to Regular in order to give her more options and to assess gut function and tolerance.  Talked with pt's family member who reports that pt does not eat large portions, on average, and that she began steadily losing weight 2.5 years ago when her (pt's sister) passed away and has been unable to gain weight since that time. Family member states that she was purchasing items for pt to eat and also providing her with Ensure which pt would consume variably. She also indicates that pt had Boost Breeze on previous admissions and liked this supplement; will order Boost Breeze TID.  Will monitor PO intakes and tolerance. Should gut function be suitable for PO nutrition, will monitor GOC as TPN would not be a suitable long-term solution for inadequate  intakes.  Medications reviewed. Labs reviewed; CBGs: 100-142 mg/dL, BUN elevated, Ca: 8.5 mg/dL.  8/17 Pt now POD #3 ex lap with lysis of adhesions. NGT was removed yesterday (8/16) afternoon. Pt currently receiving TPN at goal rate: Clinimix E 5/15 @ 60 mL/hr with 20% lipids @ 10 mL/hr which is providing 72 grams protein, 1502 kcal which is meeting needs.  Plan per pharmacy today: At 1800 today:  Increase Clinimix E 5/15 to 60 ml/hr (goal rate). (yet RD noted TPN running at this rate at time of visit this AM)  20% fat emulsion at 10 ml/hr.  TPN to contain standard multivitamins and trace elements.  Reduce IVF to 40 ml/hr.  Change insulin coverage to Novolog sensitive scale q4h  TPN lab panels on Mondays & Thursdays; f/u K/Phos repletion  8/15 - New consult received for new TPN. Pt POD 1 ex lap with lysis of adhesions.  - Pt transferred from ICU today.  - She has been NPO since admission and unable to meet needs. Needs calculated during initially assessment remain appropriate although higher ranges of kcal and protein should be achieved, if possible due to recent surgery.  - Current order in place for Clinimix E 5/15 @ 40 mL/hr with 20% lipids @ 10 mL/hr which provides 48 grams protein, 1162 kcal which does not meet needs. Plan for goal: Clinimix E 5/15 @ 60 mL/hr with 20% lipids @ 10 mL/hr to provide 72 grams protein, 1502 kcal.  - Plan per pharmacy today: At 1800 today:  Start  Clinimix E 5/15 at 40 ml/hr.  20% fat emulsion at 10 ml/hr.  Plan to advance as tolerated to the goal rate.  TPN to contain standard multivitamins and trace elements.  Reduce IVF to 60 ml/hr.  Add Novolog sensitive scale q6hr  TPN lab panels on Mondays & Thursdays.  8/11 - Per pt, she drinks Ensure at home, one a day. Encouraged her to drink increase to 2-3 daily. - Per weight history, pt has lost 12 lb since 10/02/14 (12% weight loss x 10 months, insignificant for time frame).  Diet  Order:  .TPN (CLINIMIX-E) Adult Diet regular Room service appropriate?: Yes; Fluid consistency:: Thin .TPN (CLINIMIX-E) Adult  Skin:  Reviewed, no issues  Last BM:  PTA  Height:   Ht Readings from Last 1 Encounters:  08/11/15 4' 11"  (1.499 m)    Weight:   Wt Readings from Last 1 Encounters:  08/11/15 108 lb 11 oz (49.3 kg)    Ideal Body Weight:  44.1 kg  BMI:  Body mass index is 21.94 kg/(m^2).  Estimated Nutritional Needs:   Kcal:  1250-1450  Protein:  65-75g  Fluid:  1.5L/day  EDUCATION NEEDS:   No education needs identified at this time     Jarome Matin, RD, LDN Inpatient Clinical Dietitian Pager # [phone] After hours/weekend pager # [phone]

## 2015-08-14 NOTE — Progress Notes (Signed)
Patient has indigestion. PCP on call was paged.

## 2015-08-15 ENCOUNTER — Ambulatory Visit: Payer: Medicare Other | Admitting: Endocrinology

## 2015-08-15 ENCOUNTER — Inpatient Hospital Stay (HOSPITAL_COMMUNITY): Payer: Medicare Other

## 2015-08-15 DIAGNOSIS — K913 Postprocedural intestinal obstruction: Secondary | ICD-10-CM

## 2015-08-15 LAB — GLUCOSE, CAPILLARY
Glucose-Capillary: 125 mg/dL — ABNORMAL HIGH (ref 65–99)
Glucose-Capillary: 132 mg/dL — ABNORMAL HIGH (ref 65–99)
Glucose-Capillary: 133 mg/dL — ABNORMAL HIGH (ref 65–99)
Glucose-Capillary: 142 mg/dL — ABNORMAL HIGH (ref 65–99)
Glucose-Capillary: 143 mg/dL — ABNORMAL HIGH (ref 65–99)
Glucose-Capillary: 154 mg/dL — ABNORMAL HIGH (ref 65–99)

## 2015-08-15 MED ORDER — TRACE MINERALS CR-CU-MN-SE-ZN 10-1000-500-60 MCG/ML IV SOLN
INTRAVENOUS | Status: AC
  Administered 2015-08-15: 17:00:00 via INTRAVENOUS
  Filled 2015-08-15: qty 1440

## 2015-08-15 MED ORDER — INSULIN ASPART 100 UNIT/ML ~~LOC~~ SOLN
0.0000 [IU] | Freq: Four times a day (QID) | SUBCUTANEOUS | Status: DC
  Administered 2015-08-15 – 2015-08-16 (×3): 1 [IU] via SUBCUTANEOUS

## 2015-08-15 MED ORDER — FAT EMULSION 20 % IV EMUL
240.0000 mL | INTRAVENOUS | Status: AC
  Administered 2015-08-15: 240 mL via INTRAVENOUS
  Filled 2015-08-15: qty 250

## 2015-08-15 NOTE — Progress Notes (Addendum)
[facility] PROGRESS NOTE  Desiree Hutchinson XBM:[id number] DOB: 1920-02-05 DOA: 08/05/2015 PCP: [name], MD  HPI: 81 year old patient with diverticular disease, HTN, LVH, COPD and aortic stenosis presented to the ED on 08/06/15 for diffusing, cramping abdominal pain that began on 08/05/15 after eating dinner. She denies nausea, vomiting, fever and chills. Pain fluctuates in intensity up to 8/10. She had a normal bowel movement on 08/05/15. Does note her bowel movements have been darker since taking iron supplements. She also complains of generalized weakness x 2 weeks. She has a history of 2 previous abdominal surgeries and does not have a history of colon cancer. She was recently hospitalized 7/23-7/27/16 for diverticulosis of the colon with hemorrhage and required a transfusion for anemia. Abdominal CT in the ED on 08/06/15 demonstrated findings consistent with a small bowel obstruction with no abscesses or perforation.  Patient was initially managed conservatively with NPO, NG tube and IVF, however failed to improve and she was taken to OR on 8/14. She was observed in SDU post op and transferred to floor on 8/15.  NG was successfully clamped on 8/16 and pt started on clears. She noted increasing abd pains with nausea/vomiting on 8/19 with abd xray demonstrating post-op ileus.  Assessment/Plan: Small bowel obstruction - patient underwent ex lap with LOA on 8/14 per general surgery - NG tube was d/c'd post-op with diet being advanced - Patient has reported continued pain with food. With nausea/vomiting today. - Abd xray has demonstrated post-op ileus. Pt NPO - Surgery continues to follow and does not recommend d/c or leaving this facility yet  Post-Op Ileus - Per above. Noted on abd xray - NPO for now  Hypertension - PRN IV hydralazine if SBP >180   Acute Kidney Injury - Likely due to dehydration.  - Cr remains stable, monitor renal panel  COPD - Currently remains stable, no  wheezing  Protein-Calorie Malnutrition - TPN is continued - Ultimate plans to eventually stop when reliably tolerating PO nutrition - NPO currently  Recent GI bleed - Monitor with serial CBCs, no evidence of bleeding currently  ABLA - post op, monitor CBC  Code Status: Full Family Communication: Pt in room Disposition Plan: Pending  Consultants:  General Surgery  Procedures:  Ex lap with lysis of adhesion 8/14  Antibiotics:    HPI/Subjective: Complains of increased abd pain and nausea/vomiting  Objective: Filed Vitals:   08/15/15 0447 08/15/15 0553 08/15/15 0954 08/15/15 1417  BP: 181/85 166/75  163/73  Pulse: 108 102  116  Temp: 97.8 F (36.6 C)   98.8 F (37.1 C)  TempSrc: Oral   Oral  Resp: 20   20  Height:      Weight: 50.1 kg (110 lb 7.2 oz)     SpO2: 98%  93% 96%    Intake/Output Summary (Last 24 hours) at 08/15/15 1557 Last data filed at 08/15/15 1400  Gross per 24 hour  Intake   1330 ml  Output    950 ml  Net    380 ml   Filed Weights   08/11/15 0400 08/11/15 1147 08/15/15 0447  Weight: 49.5 kg (109 lb 2 oz) 49.3 kg (108 lb 11 oz) 50.1 kg (110 lb 7.2 oz)    Exam:   General:  Awake, laying in bed, appears uncomfortable  Cardiovascular: regular, s1, s2  Respiratory: normal resp effort, no wheezing  Abdomen: soft, generally tender, decerased BS  Musculoskeletal: perfused, no clubbing   Data Reviewed: Basic Metabolic Panel:  Recent Labs  Lab 08/09/15 0450 08/10/15 0428 08/11/15 0522 08/11/15 0900 08/12/15 0420 08/14/15 0520  NA 141 141 144  --  140 139  K 4.5 4.2 4.5  --  3.5 3.6  CL 112* 113* 115*  --  111 107  CO2 19* 19* 20*  --  25 26  GLUCOSE 74 65 104*  --  155* 105*  BUN 27* 26* 25*  --  20 21*  CREATININE 1.01* 0.93 1.03*  --  0.77 0.69  CALCIUM 9.0 8.9 8.7*  --  8.4* 8.5*  MG  --   --   --  1.9 2.1 2.0  PHOS  --   --   --  3.2 1.8* 3.1   Liver Function Tests:  Recent Labs Lab 08/10/15 0428 08/12/15 0420  08/14/15 0520  AST 19 17 23   ALT 11* 10* 11*  ALKPHOS 63 44 42  BILITOT 0.8 0.5 0.4  PROT 5.6* 4.6* 5.0*  ALBUMIN 3.1* 2.5* 2.6*   No results for input(s): LIPASE, AMYLASE in the last 168 hours. No results for input(s): AMMONIA in the last 168 hours. CBC:  Recent Labs Lab 08/10/15 0428 08/11/15 0522 08/12/15 0420  WBC 4.5 9.0 7.3  HGB 10.0* 9.8* 8.4*  HCT 31.1* 30.6* 26.0*  MCV 96.3 93.9 93.5  PLT 157 151 128*   Cardiac Enzymes: No results for input(s): CKTOTAL, CKMB, CKMBINDEX, TROPONINI in the last 168 hours. BNP (last 3 results) No results for input(s): BNP in the last 8760 hours.  ProBNP (last 3 results) No results for input(s): PROBNP in the last 8760 hours.  CBG:  Recent Labs Lab 08/14/15 2016 08/15/15 0007 08/15/15 0447 08/15/15 0759 08/15/15 1140  GLUCAP 128* 143* 142* 154* 133*    Recent Results (from the past 240 hour(s))  Surgical pcr screen     Status: None   Collection Time: 08/10/15  9:40 AM  Result Value Ref Range Status   MRSA, PCR NEGATIVE NEGATIVE Final   Staphylococcus aureus NEGATIVE NEGATIVE Final    Comment:        The Xpert SA Assay (FDA approved for NASAL specimens in patients over 73 years of age), is one component of a comprehensive surveillance program.  Test performance has been validated by [facility] for patients greater than or equal to 32 year old. It is not intended to diagnose infection nor to guide or monitor treatment.      Studies: Dg Abd Portable 1v  08/15/2015   CLINICAL DATA:  Abdominal pain. Nausea and vomiting. Initial encounter.  EXAM: PORTABLE ABDOMEN - 1 VIEW  COMPARISON:  08/10/2015.  FINDINGS: Gaseous distension of large and small bowel is present. Midline abdominal staples are present compatible with laparotomy. No gross plain film evidence of free air. There may be a tiny amount of gas in the region of the rectosigmoid. Gaseous distension is diffuse.  IMPRESSION: Diffuse gaseous distension of bowel,  most compatible with postoperative ileus.   Electronically Signed   By: Andreas Newport M.D.   On: 08/15/2015 13:45    Scheduled Meds: . antiseptic oral rinse  7 mL Mouth Rinse q12n4p  . chlorhexidine  15 mL Mouth Rinse BID  . feeding supplement  1 Container Oral TID BM  . heparin  5,000 Units Subcutaneous 3 times per day  . insulin aspart  0-9 Units Subcutaneous 4 times per day  . sodium chloride  10-40 mL Intracatheter Q12H  . tiotropium  1 capsule Inhalation Daily   Continuous Infusions: . Marland KitchenTPN (CLINIMIX-E)  Adult 60 mL/hr at 08/14/15 1717   And  . fat emulsion 240 mL (08/14/15 1717)  . Marland KitchenTPN (CLINIMIX-E) Adult     And  . fat emulsion    . sodium chloride 40 mL/hr at 08/15/15 0053    Principal Problem:   SBO (small bowel obstruction) Active Problems:   Essential hypertension   Protein-calorie malnutrition   AKI (acute kidney injury)   CHIU, STEPHEN K  [facility] Pager [phone]. If 7PM-7AM, please contact night-coverage at [email or web address], password [facility] 08/15/2015, 3:57 PM  LOS: 9 days

## 2015-08-15 NOTE — Progress Notes (Signed)
Patient ID: Desiree Hutchinson, female   DOB: [birth year], 81 y.o.   MRN: [id number]     [facility]      [address] [place], [place] [address]    Phone: [phone] FAX: [phone]     Subjective: Nausea and vomiting.    Objective:  Vital signs:  Filed Vitals:   08/14/15 2027 08/15/15 0447 08/15/15 0553 08/15/15 0954  BP: 165/79 181/85 166/75   Pulse: 99 108 102   Temp: 97.9 F (36.6 C) 97.8 F (36.6 C)    TempSrc: Oral Oral    Resp: 18 20    Height:      Weight:  50.1 kg (110 lb 7.2 oz)    SpO2: 99% 98%  93%    Last BM Date: 08/03/15  Intake/Output   Yesterday:  08/18 0701 - 08/19 0700 In: 1560 [P.O.:240; I.V.:480; TPN:840] Out: 975 [Urine:975] This shift:  Total I/O In: -  Out: 175 [Urine:175]  Physical Exam: General: Pt awake/alert/oriented x4 in no acute distress  Abdomen: Soft. +BS.  distended. Tender over incision,, staples in place, no erythema or drainage. No evidence of peritonitis. No incarcerated hernias.  Problem List:   Principal Problem:   SBO (small bowel obstruction) Active Problems:   Essential hypertension   Protein-calorie malnutrition   AKI (acute kidney injury)    Results:   Labs: Results for orders placed or performed during the hospital encounter of 08/05/15 (from the past 40 hour(s))  Glucose, capillary     Status: Abnormal   Collection Time: 08/13/15  4:31 PM  Result Value Ref Range   Glucose-Capillary 121 (H) 65 - 99 mg/dL  Glucose, capillary     Status: Abnormal   Collection Time: 08/13/15  8:30 PM  Result Value Ref Range   Glucose-Capillary 106 (H) 65 - 99 mg/dL  Glucose, capillary     Status: Abnormal   Collection Time: 08/14/15 12:09 AM  Result Value Ref Range   Glucose-Capillary 100 (H) 65 - 99 mg/dL  Glucose, capillary     Status: Abnormal   Collection Time: 08/14/15  4:21 AM  Result Value Ref Range   Glucose-Capillary 101 (H) 65 - 99 mg/dL  Comprehensive  metabolic panel     Status: Abnormal   Collection Time: 08/14/15  5:20 AM  Result Value Ref Range   Sodium 139 135 - 145 mmol/L   Potassium 3.6 3.5 - 5.1 mmol/L   Chloride 107 101 - 111 mmol/L   CO2 26 22 - 32 mmol/L   Glucose, Bld 105 (H) 65 - 99 mg/dL   BUN 21 (H) 6 - 20 mg/dL   Creatinine, Ser 0.69 0.44 - 1.00 mg/dL   Calcium 8.5 (L) 8.9 - 10.3 mg/dL   Total Protein 5.0 (L) 6.5 - 8.1 g/dL   Albumin 2.6 (L) 3.5 - 5.0 g/dL   AST 23 15 - 41 U/L   ALT 11 (L) 14 - 54 U/L   Alkaline Phosphatase 42 38 - 126 U/L   Total Bilirubin 0.4 0.3 - 1.2 mg/dL   GFR calc non Af Amer >60 >60 mL/min   GFR calc Af Amer >60 >60 mL/min    Comment: (NOTE) The eGFR has been calculated using the CKD EPI equation. This calculation has not been validated in all clinical situations. eGFR's persistently <60 mL/min signify possible Chronic Kidney Disease.    Anion gap 6 5 - 15  Magnesium     Status: None  Collection Time: 08/14/15  5:20 AM  Result Value Ref Range   Magnesium 2.0 1.7 - 2.4 mg/dL  Phosphorus     Status: None   Collection Time: 08/14/15  5:20 AM  Result Value Ref Range   Phosphorus 3.1 2.5 - 4.6 mg/dL  Glucose, capillary     Status: Abnormal   Collection Time: 08/14/15  7:57 AM  Result Value Ref Range   Glucose-Capillary 107 (H) 65 - 99 mg/dL  Glucose, capillary     Status: Abnormal   Collection Time: 08/14/15 11:22 AM  Result Value Ref Range   Glucose-Capillary 103 (H) 65 - 99 mg/dL  Glucose, capillary     Status: Abnormal   Collection Time: 08/14/15  4:56 PM  Result Value Ref Range   Glucose-Capillary 117 (H) 65 - 99 mg/dL  Glucose, capillary     Status: Abnormal   Collection Time: 08/14/15  8:16 PM  Result Value Ref Range   Glucose-Capillary 128 (H) 65 - 99 mg/dL   Comment 1 Notify RN   Glucose, capillary     Status: Abnormal   Collection Time: 08/15/15 12:07 AM  Result Value Ref Range   Glucose-Capillary 143 (H) 65 - 99 mg/dL  Glucose, capillary     Status: Abnormal    Collection Time: 08/15/15  4:47 AM  Result Value Ref Range   Glucose-Capillary 142 (H) 65 - 99 mg/dL  Glucose, capillary     Status: Abnormal   Collection Time: 08/15/15  7:59 AM  Result Value Ref Range   Glucose-Capillary 154 (H) 65 - 99 mg/dL  Glucose, capillary     Status: Abnormal   Collection Time: 08/15/15 11:40 AM  Result Value Ref Range   Glucose-Capillary 133 (H) 65 - 99 mg/dL    Imaging / Studies: No results found.  Medications / Allergies:  Scheduled Meds: . antiseptic oral rinse  7 mL Mouth Rinse q12n4p  . chlorhexidine  15 mL Mouth Rinse BID  . feeding supplement  1 Container Oral TID BM  . heparin  5,000 Units Subcutaneous 3 times per day  . insulin aspart  0-9 Units Subcutaneous 4 times per day  . sodium chloride  10-40 mL Intracatheter Q12H  . tiotropium  1 capsule Inhalation Daily   Continuous Infusions: . Marland KitchenTPN (CLINIMIX-E) Adult 60 mL/hr at 08/14/15 1717   And  . fat emulsion 240 mL (08/14/15 1717)  . Marland KitchenTPN (CLINIMIX-E) Adult     And  . fat emulsion    . sodium chloride 40 mL/hr at 08/15/15 0053   PRN Meds:.acetaminophen, hydrALAZINE, HYDROcodone-acetaminophen, iohexol, morphine injection, ondansetron, sodium chloride  Antibiotics: Anti-infectives    Start     Dose/Rate Route Frequency Ordered Stop   08/10/15 0931  cefOXitin (MEFOXIN) 2 g in dextrose 5 % 50 mL IVPB     2 g 100 mL/hr over 30 Minutes Intravenous 30 min pre-op 08/10/15 0929 08/10/15 1039         Assessment/Plan SBO POD#5 exploratory laparotomy, LOA- 08/10/2015 --Dr. Excell Seltzer   -back down to NPO x ice chips  -NGT if she continues to vomit  -up to air, mobilize, PT, IS PCM-continue TPN, DC when PO is adequate. VTE prophylaxis-SCD/heparin HTN-on PRN hydralazine  COPD-pulmonary toilet Aortic stenosis Anemia-stable h&h Dispo-ileus.  Not ready for DC  Erby Pian, [facility] Pager [phone])   08/15/2015 12:52 PM  Agree with  above. Appears to have gone backwards.  Alphonsa Overall, MD, [facility] Pager: [phone] Office phone:  [phone]

## 2015-08-15 NOTE — Care Management Note (Signed)
Case Management Note  Patient Details  Name: MAUDEAN HOFFMANN MRN: [id number] Date of Birth: 1920/05/19  Subjective/Objective:   LTAC appropriate for [facility]. Per attending-not ready for LTAC-[facility] managing. POD#5 exp lap,SBO,ileus-TPN. CSW also following for SNF.                 Action/Plan:LTAC/SNF   Expected Discharge Date:   (UNKNOWN)               Expected Discharge Plan:  Skilled Nursing Facility  In-House Referral:  NA  Discharge planning Services  CM Consult  Post Acute Care Choice:  NA Choice offered to:  Patient  DME Arranged:  N/A DME Agency:  NA  HH Arranged:    HH Agency:  NA  Status of Service:  In process, will continue to follow  Medicare Important Message Given:  Yes-third notification given Date Medicare IM Given:    Medicare IM give by:    Date Additional Medicare IM Given:    Additional Medicare Important Message give by:     If discussed at Long Length of Stay Meetings, dates discussed:    Additional Comments:  Lanier Clam, RN 08/15/2015, 3:04 PM

## 2015-08-15 NOTE — Progress Notes (Signed)
Nutrition Follow-up  DOCUMENTATION CODES:   Severe malnutrition in context of chronic illness  INTERVENTION:  - Continue TPN per pharmacy - Continue Boost Breeze TID - RD will continue to monitor for needs  NUTRITION DIAGNOSIS:   Malnutrition related to chronic illness as evidenced by severe depletion of body fat, severe depletion of muscle mass. -ongoing  GOAL:   Patient will meet greater than or equal to 90% of their needs -met with TPN  MONITOR:   Weight trends, Labs, I & O's, Other (Comment) (TPN regimen)  ASSESSMENT:   81 y.o. female has a past medical history significant for diverticular disease, HTN, LVH, COPD and aortic stenosis who presented to the ED for diffuse, cramping abdominal pain that began last night after dinner.   8/19 Pt continues with Clinimix 5/15 @ 60 mL/hr with 20% lipids @ 10 mL/hr which is providing 72 grams protein, 1502 kcal which is meeting needs. Per chart review, pt ate 5% of dinner last night. She had episode of emesis at 0009 today. Will monitor for ability to tolerate oral/enteral route of nutrition. If gut is function, it would be preferential to transition from TPN to TF if long-term nutrition support will be needed and if within [place].  No pharmacy note yet today. Medications reviewed. Labs reviewed; CBGs: 100-154 mg/dL, BUN elevated, Ca: 8.5 mg/dL.  **Family had asked if Boost Gwyneth Revels was available at [facility] on [address]; [place] called and Ensure Clear (an equivalent) is offered at this facility.    8/18 - Pt continues with Clinimix 5/15 @ 60 mL/hr with 20% lipids @ 10 mL/hr which is providing 72 grams protein, 1502 kcal which is meeting needs. - Per discussion with pharmacist, pt not taking much PO on liquid diets; diet advanced to Regular in order to give her more options and to assess gut function and tolerance. -  Pt's family member reports pt does not eat large portions, on average, and that she began steadily losing  weight 2.5 years ago and has been unable to gain weight since that time.  - Family member states that she purchased Ensure which pt would consume variably. - She also indicates that pt had Boost Breeze on previous admissions and liked this.   - Will monitor PO intakes and tolerance. Should gut function be suitable for PO nutrition, will monitor GOC as TPN would not be a suitable long-term solution for inadequate intakes.  8/17 - Pt now POD #3 ex lap with lysis of adhesions. NGT was removed yesterday (8/16) afternoon.  - Pt currently receiving TPN at goal rate: Clinimix E 5/15 @ 60 mL/hr with 20% lipids @ 10 mL/hr which is providing 72 grams protein, 1502 kcal which is meeting needs.  8/15 - New consult received for new TPN. Pt POD 1 ex lap with lysis of adhesions.  - Pt transferred from ICU today.  - She has been NPO since admission and unable to meet needs. Needs calculated during initially assessment remain appropriate although higher ranges of kcal and protein should be achieved, if possible due to recent surgery.  - Current order in place for Clinimix E 5/15 @ 40 mL/hr with 20% lipids @ 10 mL/hr which provides 48 grams protein, 1162 kcal which does not meet needs. Plan for goal: Clinimix E 5/15 @ 60 mL/hr with 20% lipids @ 10 mL/hr to provide 72 grams protein, 1502 kcal.   8/11 - Per pt, she drinks Ensure at home, one a day. Encouraged her to drink  increase to 2-3 daily. - Per weight history, pt has lost 12 lb since 10/02/14 (12% weight loss x 10 months, insignificant for time frame).  Diet Order:  .TPN (CLINIMIX-E) Adult Diet clear liquid Room service appropriate?: Yes; Fluid consistency:: Thin .TPN (CLINIMIX-E) Adult  Skin:  Reviewed, no issues  Last BM:  PTA  Height:   Ht Readings from Last 1 Encounters:  08/11/15 4' 11"  (1.499 m)    Weight:   Wt Readings from Last 1 Encounters:  08/15/15 110 lb 7.2 oz (50.1 kg)    Ideal Body Weight:  44.1 kg  BMI:  Body mass  index is 22.3 kg/(m^2).  Estimated Nutritional Needs:   Kcal:  1250-1450  Protein:  65-75g  Fluid:  1.5L/day  EDUCATION NEEDS:   No education needs identified at this time     Jarome Matin, RD, LDN Inpatient Clinical Dietitian Pager # [phone] After hours/weekend pager # [phone]

## 2015-08-15 NOTE — Progress Notes (Signed)
Patient had an episode of emesis. PCP on call was notified

## 2015-08-15 NOTE — Progress Notes (Signed)
PARENTERAL NUTRITION CONSULT NOTE  Pharmacy Consult for TPN Indication: SBO  No Known Allergies  Patient Measurements: Height: 4\' 11"  (149.9 cm) Weight: 110 lb 7.2 oz (50.1 kg) IBW/kg (Calculated) : 43.2  Vital Signs: Temp: 97.8 F (36.6 C) (08/19 0447) Temp Source: Oral (08/19 0447) BP: 166/75 mmHg (08/19 0553) Pulse Rate: 102 (08/19 0553) Intake/Output from previous day: 08/18 0701 - 08/19 0700 In: 1560 [P.O.:240; I.V.:480; TPN:840] Out: 975 [Urine:975] Intake/Output from this shift:    Labs: No results for input(s): WBC, HGB, HCT, PLT, APTT, INR in the last 72 hours.  Recent Labs  08/14/15 0520  NA 139  K 3.6  CL 107  CO2 26  GLUCOSE 105*  BUN 21*  CREATININE 0.69  CALCIUM 8.5*  MG 2.0  PHOS 3.1  PROT 5.0*  ALBUMIN 2.6*  AST 23  ALT 11*  ALKPHOS 42  BILITOT 0.4   Estimated Creatinine Clearance: 30 mL/min (by C-G formula based on Cr of 0.69).    Recent Labs  08/14/15 2016 08/15/15 0007 08/15/15 0447  GLUCAP 128* 143* 142*    Medical History: Past Medical History  Diagnosis Date  . History of atherosclerotic cardiovascular disease   . Hypertension   . Left ventricular hypertrophy   . Gastritis   . Cough   . Personal history of tobacco use, presenting hazards to health   . Obesity   . Chest pain, non-cardiac   . Allergic rhinitis   . COPD (chronic obstructive pulmonary disease)   . History of small bowel obstruction   . Chronic diastolic heart failure   . Aortic valve disease     regurge > stenosis   Medications:  Scheduled:  . antiseptic oral rinse  7 mL Mouth Rinse q12n4p  . chlorhexidine  15 mL Mouth Rinse BID  . feeding supplement  1 Container Oral TID BM  . heparin  5,000 Units Subcutaneous 3 times per day  . insulin aspart  0-9 Units Subcutaneous 6 times per day  . sodium chloride  10-40 mL Intracatheter Q12H  . tiotropium  1 capsule Inhalation Daily   Infusions:  . Marland KitchenTPN (CLINIMIX-E) Adult 60 mL/hr at 08/14/15 1717   And   . fat emulsion 240 mL (08/14/15 1717)  . sodium chloride 40 mL/hr at 08/15/15 0053   Insulin Requirements: 1 unit Novolog on 8/18  Current Nutrition: now back to CLD  IVF: NS at 40 ml/hr  Central access:  8/14 TPN start date:  8/15  ASSESSMENT                                                                                                          HPI: 31 yoF admit 8/10 with abd pain, hx of diverticular disease, SBO. On 8/14 underwent Expl Lap & LOA for persistent SBO after 4 days conservative management. Begin TPN 8/15, Pharmacy to dose.  Significant events:  8/15: TPN started 8/18: Didn't tolerate full liquids that well (didn't like menu items), so going to regular diet. 8/19: experienced indigestion followed by emesis on regular diet; back on  CLD today  Today:   Glucose: CBGs at goal < 150  Electrolytes: all wnl as of 8/17  Renal: slight bump in SCr on 8/9 resolved; CrCl 30 d/t advanced age and low weight.  UOP good  LFTs: wnl  TGs: 87  Prealbumin: 7.6  NUTRITIONAL GOALS                                                                                             RD recs:  Kcal: 1250-1450 Protein: 65-75g Fluid: 1.5L/day  Clinimix E 5/15 at a goal rate of 60 ml/hr + 20% fat emulsion at 10 ml/hr to provide: 72 g/day protein, 1500 Kcal/day.  PLAN                                                                                                                         At 1800 today:  Continue Clinimix E 5/15 at 60 ml/hr (goal rate).  Doesn't appear to be mechanically tolerating diet, so expect to hold off on weaning for now  20% fat emulsion at 10 ml/hr.  TPN to contain standard multivitamins and trace elements.  Continue IVF at 40 ml/hr.  Will reduce insulin coverage to q6 on sensitive SSI  TPN lab panels on Mondays & Thursdays  Will check phos over the weekend as all other lytes have been stable wnl  F/u daily.  Bernadene Person, PharmD, BCPS Pager:  [phone] 08/15/2015, 7:55 AM

## 2015-08-15 NOTE — Care Management Note (Signed)
Case Management Note  Patient Details  Name: Desiree Hutchinson MRN: [id number] Date of Birth: [birth year]  Subjective/Objective:LTAC appropriate for Desiree Hutchinson can manage services. Per attending-not ready for LTAC per [facility].POD#5 exp lap,sbo,ileus,ngt.                  Action/Plan:d/c plan LTAC/SNF.   Expected Discharge Date:   (UNKNOWN)               Expected Discharge Plan:  Skilled Nursing Facility  In-House Referral:  NA  Discharge planning Services  CM Consult  Post Acute Care Choice:  NA Choice offered to:  Patient  DME Arranged:  N/A DME Agency:  NA  HH Arranged:    HH Agency:  NA  Status of Service:  In process, will continue to follow  Medicare Important Message Given:  Yes-third notification given Date Medicare IM Given:    Medicare IM give by:    Date Additional Medicare IM Given:    Additional Medicare Important Message give by:     If discussed at Long Length of Stay Meetings, dates discussed:    Additional Comments:  Lanier Clam, RN 08/15/2015, 2:24 PM

## 2015-08-16 LAB — PHOSPHORUS: Phosphorus: 3.2 mg/dL (ref 2.5–4.6)

## 2015-08-16 LAB — GLUCOSE, CAPILLARY
Glucose-Capillary: 110 mg/dL — ABNORMAL HIGH (ref 65–99)
Glucose-Capillary: 111 mg/dL — ABNORMAL HIGH (ref 65–99)
Glucose-Capillary: 124 mg/dL — ABNORMAL HIGH (ref 65–99)
Glucose-Capillary: 90 mg/dL (ref 65–99)
Glucose-Capillary: 98 mg/dL (ref 65–99)

## 2015-08-16 MED ORDER — FAT EMULSION 20 % IV EMUL
240.0000 mL | INTRAVENOUS | Status: AC
  Administered 2015-08-16: 240 mL via INTRAVENOUS
  Filled 2015-08-16: qty 250

## 2015-08-16 MED ORDER — TRACE MINERALS CR-CU-MN-SE-ZN 10-1000-500-60 MCG/ML IV SOLN
INTRAVENOUS | Status: AC
  Administered 2015-08-16: 18:00:00 via INTRAVENOUS
  Filled 2015-08-16: qty 1440

## 2015-08-16 NOTE — Progress Notes (Signed)
[facility] PROGRESS NOTE  Desiree Hutchinson:[id number] DOB: July 04, 1920 DOA: 08/05/2015 PCP: [name], MD  HPI: 81 year old patient with diverticular disease, HTN, LVH, COPD and aortic stenosis presented to the ED on 08/06/15 for diffusing, cramping abdominal pain that began on 08/05/15 after eating dinner. She denies nausea, vomiting, fever and chills. Pain fluctuates in intensity up to 8/10. She had a normal bowel movement on 08/05/15. Does note her bowel movements have been darker since taking iron supplements. She also complains of generalized weakness x 2 weeks. She has a history of 2 previous abdominal surgeries and does not have a history of colon cancer. She was recently hospitalized 7/23-7/27/16 for diverticulosis of the colon with hemorrhage and required a transfusion for anemia. Abdominal CT in the ED on 08/06/15 demonstrated findings consistent with a small bowel obstruction with no abscesses or perforation.  Patient was initially managed conservatively with NPO, NG tube and IVF, however failed to improve and she was taken to OR on 8/14. She was observed in SDU post op and transferred to floor on 8/15.  NG was successfully clamped on 8/16 and pt started on clears. She noted increasing abd pains with nausea/vomiting on 8/19 with abd xray demonstrating post-op ileus. The patient was kept NPO.  Assessment/Plan: Small bowel obstruction - patient underwent ex lap with LOA on 8/14 per general surgery - NG tube was d/c'd post-op with diet being advanced - Patient has reported continued pain with food. With nausea/vomiting today. - Abd xray has demonstrated post-op ileus. Pt remains NPO with TPN - Surgery continues to follow and does not recommend d/c or leaving this facility yet  Post-Op Ileus - Per above. Noted on abd xray - NPO for now  Hypertension - PRN IV hydralazine if SBP >180   Acute Kidney Injury - Likely due to dehydration.  - Cr remains stable, monitor renal  panel  COPD - Currently remains stable, no wheezing  Protein-Calorie Malnutrition - TPN is continued - Patient remains NPO currently  Recent GI bleed - Monitor with serial CBCs, no evidence of bleeding currently  ABLA - post op, monitor CBC  Code Status: Full Family Communication: Pt in room Disposition Plan: Pending  Consultants:  General Surgery  Procedures:  Ex lap with lysis of adhesion 8/14  Antibiotics:    HPI/Subjective: Complains of abd pain. Reports passing some flatus with improvement in symptoms   Objective: Filed Vitals:   08/15/15 1417 08/15/15 2007 08/16/15 0537 08/16/15 1336  BP: 163/73 157/69 142/50 121/55  Pulse: 116 120 102 92  Temp: 98.8 F (37.1 C) 98.2 F (36.8 C) 99.3 F (37.4 C) 98.5 F (36.9 C)  TempSrc: Oral Oral Oral Oral  Resp: 20 18 18 16   Height:      Weight:   50 kg (110 lb 3.7 oz)   SpO2: 96% 93% 96% 95%    Intake/Output Summary (Last 24 hours) at 08/16/15 1358 Last data filed at 08/16/15 1300  Gross per 24 hour  Intake 1260.33 ml  Output   1900 ml  Net -639.67 ml   Filed Weights   08/11/15 1147 08/15/15 0447 08/16/15 0537  Weight: 49.3 kg (108 lb 11 oz) 50.1 kg (110 lb 7.2 oz) 50 kg (110 lb 3.7 oz)    Exam:   General:  Awake, laying in bed, appears uncomfortable  Cardiovascular: regular, s1, s2  Respiratory: normal resp effort, no wheezing  Abdomen: soft, generally tender, pos BS, mildly distended  Musculoskeletal: perfused, no clubbing   Data  Reviewed: Basic Metabolic Panel:  Recent Labs Lab 08/10/15 0428 08/11/15 0522 08/11/15 0900 08/12/15 0420 08/14/15 0520 08/16/15 0545  NA 141 144  --  140 139  --   K 4.2 4.5  --  3.5 3.6  --   CL 113* 115*  --  111 107  --   CO2 19* 20*  --  25 26  --   GLUCOSE 65 104*  --  155* 105*  --   BUN 26* 25*  --  20 21*  --   CREATININE 0.93 1.03*  --  0.77 0.69  --   CALCIUM 8.9 8.7*  --  8.4* 8.5*  --   MG  --   --  1.9 2.1 2.0  --   PHOS  --   --  3.2  1.8* 3.1 3.2   Liver Function Tests:  Recent Labs Lab 08/10/15 0428 08/12/15 0420 08/14/15 0520  AST ALT 11* 10* 11*  ALKPHOS 63 44 42  BILITOT 0.8 0.5 0.4  PROT 5.6* 4.6* 5.0*  ALBUMIN 3.1* 2.5* 2.6*   No results for input(s): LIPASE, AMYLASE in the last 168 hours. No results for input(s): AMMONIA in the last 168 hours. CBC:  Recent Labs Lab 08/10/15 0428 08/11/15 0522 08/12/15 0420  WBC 4.5 9.0 7.3  HGB 10.0* 9.8* 8.4*  HCT 31.1* 30.6* 26.0*  MCV 96.3 93.9 93.5  PLT 157 151 128*   Cardiac Enzymes: No results for input(s): CKTOTAL, CKMB, CKMBINDEX, TROPONINI in the last 168 hours. BNP (last 3 results) No results for input(s): BNP in the last 8760 hours.  ProBNP (last 3 results) No results for input(s): PROBNP in the last 8760 hours.  CBG:  Recent Labs Lab 08/15/15 1626 08/15/15 2004 08/16/15 0016 08/16/15 0633 08/16/15 1137  GLUCAP 125* 132* 124* 111* 110*    Recent Results (from the past 240 hour(s))  Surgical pcr screen     Status: None   Collection Time: 08/10/15  9:40 AM  Result Value Ref Range Status   MRSA, PCR NEGATIVE NEGATIVE Final   Staphylococcus aureus NEGATIVE NEGATIVE Final    Comment:        The Xpert SA Assay (FDA approved for NASAL specimens in patients over 10 years of age), is one component of a comprehensive surveillance program.  Test performance has been validated by [facility] for patients greater than or equal to 23 year old. It is not intended to diagnose infection nor to guide or monitor treatment.      Studies: Dg Abd Portable 1v  08/15/2015   CLINICAL DATA:  Abdominal pain. Nausea and vomiting. Initial encounter.  EXAM: PORTABLE ABDOMEN - 1 VIEW  COMPARISON:  08/10/2015.  FINDINGS: Gaseous distension of large and small bowel is present. Midline abdominal staples are present compatible with laparotomy. No gross plain film evidence of free air. There may be a tiny amount of gas in the region of the  rectosigmoid. Gaseous distension is diffuse.  IMPRESSION: Diffuse gaseous distension of bowel, most compatible with postoperative ileus.   Electronically Signed   By: Andreas Newport M.D.   On: 08/15/2015 13:45    Scheduled Meds: . antiseptic oral rinse  7 mL Mouth Rinse q12n4p  . chlorhexidine  15 mL Mouth Rinse BID  . feeding supplement  1 Container Oral TID BM  . heparin  5,000 Units Subcutaneous 3 times per day  . insulin aspart  0-9 Units Subcutaneous 4 times per day  . sodium  chloride  10-40 mL Intracatheter Q12H  . tiotropium  1 capsule Inhalation Daily   Continuous Infusions: . Marland KitchenTPN (CLINIMIX-E) Adult 60 mL/hr at 08/15/15 1703   And  . fat emulsion 240 mL (08/15/15 1702)  . Marland KitchenTPN (CLINIMIX-E) Adult     And  . fat emulsion    . sodium chloride 40 mL/hr at 08/15/15 0053    Principal Problem:   SBO (small bowel obstruction) Active Problems:   Essential hypertension   Protein-calorie malnutrition   AKI (acute kidney injury)   CHIU, STEPHEN K  [facility] Pager [phone]. If 7PM-7AM, please contact night-coverage at [email or web address], password [facility] 08/16/2015, 1:58 PM  LOS: 10 days

## 2015-08-16 NOTE — Progress Notes (Signed)
PARENTERAL NUTRITION CONSULT NOTE - Follow up  Pharmacy Consult for TPN Indication: SBO  No Known Allergies  Patient Measurements: Height:  (149.9 cm) Weight: 110 lb 3.7 oz (50 kg) IBW/kg (Calculated) : 43.2  Vital Signs: Temp: 99.3 F (37.4 C) (08/20 0537) Temp Source: Oral (08/20 0537) BP: 142/50 mmHg (08/20 0537) Pulse Rate: 102 (08/20 0537) Intake/Output from previous day: 08/19 0701 - 08/20 0700 In: 1260.3 [I.V.:458.7; TPN:801.7] Out: 1600 [Urine:1600] Intake/Output from this shift:    Labs: No results for input(s): WBC, HGB, HCT, PLT, APTT, INR in the last 72 hours.  Recent Labs  08/14/15 0520 08/16/15 0545  NA 139  --   K 3.6  --   CL 107  --   CO2 26  --   GLUCOSE 105*  --   BUN 21*  --   CREATININE 0.69  --   CALCIUM 8.5*  --   MG 2.0  --   PHOS 3.1 3.2  PROT 5.0*  --   ALBUMIN 2.6*  --   AST 23  --   ALT 11*  --   ALKPHOS 42  --   BILITOT 0.4  --    Estimated Creatinine Clearance: 30 mL/min (by C-G formula based on Cr of 0.69).    Recent Labs  08/15/15 2004 08/16/15 0016 08/16/15 0633  GLUCAP 132* 124* 111*    Medical History: Past Medical History  Diagnosis Date  . History of atherosclerotic cardiovascular disease   . Hypertension   . Left ventricular hypertrophy   . Gastritis   . Cough   . Personal history of tobacco use, presenting hazards to health   . Obesity   . Chest pain, non-cardiac   . Allergic rhinitis   . COPD (chronic obstructive pulmonary disease)   . History of small bowel obstruction   . Chronic diastolic heart failure   . Aortic valve disease     regurge > stenosis   Medications:  Scheduled:  . antiseptic oral rinse  7 mL Mouth Rinse q12n4p  . chlorhexidine  15 mL Mouth Rinse BID  . feeding supplement  1 Container Oral TID BM  . heparin  5,000 Units Subcutaneous 3 times per day  . insulin aspart  0-9 Units Subcutaneous 4 times per day  . sodium chloride  10-40 mL Intracatheter Q12H  . tiotropium  1  capsule Inhalation Daily   Infusions:  . Marland KitchenTPN (CLINIMIX-E) Adult 60 mL/hr at 08/15/15 1703   And  . fat emulsion 240 mL (08/15/15 1702)  . sodium chloride 40 mL/hr at 08/15/15 0053   Insulin Requirements: 3 units Novolog on 8/19  Current Nutrition: NPO + TPN  IVF: NS at 40 ml/hr  Central access:  8/14 TPN start date:  8/15  ASSESSMENT                                                                                                          HPI: 62 yoF admit 8/10 with abd pain, hx of diverticular disease, SBO. On 8/14 underwent Expl Lap &  LOA for persistent SBO after 4 days conservative management. Begin TPN 8/15, Pharmacy to dose.  Significant events:  8/15: TPN started 8/18: Didn't tolerate full liquids that well (didn't like menu items), so going to regular diet. 8/19: experienced indigestion followed by emesis on regular diet; back to NPO.  Today:   Glucose: CBGs at goal < 150  Electrolytes: all wnl as of 8/17. Phos wnl 8/20.  Renal: wnl. CrCl 30 d/t advanced age and low weight.  LFTs: wnl  TGs: 87  Prealbumin: 7.6  NUTRITIONAL GOALS                                                                                             RD recs: 1250-1450 KCal, 65-75g protein, 1.5 L/day. Clinimix E 5/15 at a goal rate of 60 ml/hr + 20% fat emulsion at 10 ml/hr to provide: 72 g/day protein, 1500 Kcal/day.  PLAN                                                                                                                         At 1800 today:  Cont Clinimix E 5/15 at 60 ml/hr(goal rate).  20% fat emulsion at 10 ml/hr.  TPN to contain standard multivitamins and trace elements.  Cont IVF at 40 ml/hr.  Will reduce insulin coverage to q6h on sensitive SSI.  TPN lab panels on Mondays & Thursdays.  Check Bmet on 8/21.  F/u daily.  Charolotte Eke, PharmD, pager [phone]. 08/16/2015,8:55 AM.

## 2015-08-16 NOTE — Progress Notes (Signed)
Report received from A. Helsabeck,RN. No change in assessment. Continue plan of care. Field Staniszewski Johnson 

## 2015-08-16 NOTE — Progress Notes (Signed)
Patient ID: Desiree Hutchinson, female   DOB: Mar 11, 1920, 81 y.o.   MRN: [id number] [facility] Progress Note:   6 Days Post-Op  Subjective: Mental status is clear for her age.  No new complaints Objective: Vital signs in last 24 hours: Temp:  [98.2 F (36.8 C)-99.3 F (37.4 C)] 99.3 F (37.4 C) (08/20 0537) Pulse Rate:  [102-120] 102 (08/20 0537) Resp:  [18-20] 18 (08/20 0537) BP: (142-163)/(50-73) 142/50 mmHg (08/20 0537) SpO2:  [93 %-96 %] 96 % (08/20 0537) Weight:  [50 kg (110 lb 3.7 oz)] 50 kg (110 lb 3.7 oz) (08/20 0537)  Intake/Output from previous day: 08/19 0701 - 08/20 0700 In: 1260.3 [I.V.:458.7; TPN:801.7] Out: 1600 [Urine:1600] Intake/Output this shift: Total I/O In: -  Out: 300 [Urine:300]  Physical Exam: Work of breathing is not labored or elevated.  Abdomen sore with staples in place and no evidence of wound infection.    Lab Results:  Results for orders placed or performed during the hospital encounter of 08/05/15 (from the past 48 hour(s))  Glucose, capillary     Status: Abnormal   Collection Time: 08/14/15 11:22 AM  Result Value Ref Range   Glucose-Capillary 103 (H) 65 - 99 mg/dL  Glucose, capillary     Status: Abnormal   Collection Time: 08/14/15  4:56 PM  Result Value Ref Range   Glucose-Capillary 117 (H) 65 - 99 mg/dL  Glucose, capillary     Status: Abnormal   Collection Time: 08/14/15  8:16 PM  Result Value Ref Range   Glucose-Capillary 128 (H) 65 - 99 mg/dL   Comment 1 Notify RN   Glucose, capillary     Status: Abnormal   Collection Time: 08/15/15 12:07 AM  Result Value Ref Range   Glucose-Capillary 143 (H) 65 - 99 mg/dL  Glucose, capillary     Status: Abnormal   Collection Time: 08/15/15  4:47 AM  Result Value Ref Range   Glucose-Capillary 142 (H) 65 - 99 mg/dL  Glucose, capillary     Status: Abnormal   Collection Time: 08/15/15  7:59 AM  Result Value Ref Range   Glucose-Capillary 154 (H) 65 - 99 mg/dL  Glucose, capillary      Status: Abnormal   Collection Time: 08/15/15 11:40 AM  Result Value Ref Range   Glucose-Capillary 133 (H) 65 - 99 mg/dL  Glucose, capillary     Status: Abnormal   Collection Time: 08/15/15  4:26 PM  Result Value Ref Range   Glucose-Capillary 125 (H) 65 - 99 mg/dL  Glucose, capillary     Status: Abnormal   Collection Time: 08/15/15  8:04 PM  Result Value Ref Range   Glucose-Capillary 132 (H) 65 - 99 mg/dL  Glucose, capillary     Status: Abnormal   Collection Time: 08/16/15 12:16 AM  Result Value Ref Range   Glucose-Capillary 124 (H) 65 - 99 mg/dL  Phosphorus     Status: None   Collection Time: 08/16/15  5:45 AM  Result Value Ref Range   Phosphorus 3.2 2.5 - 4.6 mg/dL  Glucose, capillary     Status: Abnormal   Collection Time: 08/16/15  6:33 AM  Result Value Ref Range   Glucose-Capillary 111 (H) 65 - 99 mg/dL    Radiology/Results: Dg Abd Portable 1v  08/15/2015   CLINICAL DATA:  Abdominal pain. Nausea and vomiting. Initial encounter.  EXAM: PORTABLE ABDOMEN - 1 VIEW  COMPARISON:  08/10/2015.  FINDINGS: Gaseous distension of large and small bowel is present. Midline abdominal  staples are present compatible with laparotomy. No gross plain film evidence of free air. There may be a tiny amount of gas in the region of the rectosigmoid. Gaseous distension is diffuse.  IMPRESSION: Diffuse gaseous distension of bowel, most compatible with postoperative ileus.   Electronically Signed   By: Andreas Newport M.D.   On: 08/15/2015 13:45    Anti-infectives: Anti-infectives    Start     Dose/Rate Route Frequency Ordered Stop   08/10/15 0931  cefOXitin (MEFOXIN) 2 g in dextrose 5 % 50 mL IVPB     2 g 100 mL/hr over 30 Minutes Intravenous 30 min pre-op 08/10/15 8119 08/10/15 1039      Assessment/Plan: Problem List: Patient Active Problem List   Diagnosis Date Noted  . SBO (small bowel obstruction) 08/06/2015  . AKI (acute kidney injury) 08/06/2015  . Protein-calorie malnutrition 07/21/2015   . Lower GI bleed 07/19/2015  . Diverticulosis of colon with hemorrhage 07/19/2015  . Acute blood loss anemia 07/19/2015  . Constipation 01/10/2015  . Loss of weight 01/10/2015  . Memory loss of 01/10/2015  . Hearing loss 01/08/2014  . Renal insufficiency 03/27/2013  . Encounter for long-term (current) use of other medications 03/27/2013  . Aortic valve disorder 06/15/2010  . CHRONIC RHINITIS 06/26/2009  . SHORTNESS OF BREATH 05/29/2009  . OTHER DYSPNEA AND RESPIRATORY ABNORMALITIES 05/29/2009  . PRECORDIAL PAIN 05/29/2009  . COPD with emphysema Gold Stage A 05/28/2009  . SMALL BOWEL OBSTRUCTION, HX OF 05/28/2009  . GASTRITIS 03/28/2008  . COUGH 12/07/2007  . OBESITY 10/14/2007  . Essential hypertension 10/14/2007  . Cardiovascular disease 10/14/2007  . VENTRICULAR HYPERTROPHY, LEFT 10/14/2007  . ALLERGIC RHINITIS 10/14/2007  . CHEST PAIN, NON-CARDIAC 10/14/2007  . TOBACCO USE, QUIT 10/14/2007    Slow progress post laparotomy for SBO 6 Days Post-Op    LOS: 10 days   Matt B. Daphine Deutscher, MD, [facility] [phone] beeper [phone]  08/16/2015 8:58 AM

## 2015-08-17 LAB — GLUCOSE, CAPILLARY
Glucose-Capillary: 101 mg/dL — ABNORMAL HIGH (ref 65–99)
Glucose-Capillary: 104 mg/dL — ABNORMAL HIGH (ref 65–99)
Glucose-Capillary: 109 mg/dL — ABNORMAL HIGH (ref 65–99)
Glucose-Capillary: 96 mg/dL (ref 65–99)

## 2015-08-17 LAB — BASIC METABOLIC PANEL
Anion gap: 3 — ABNORMAL LOW (ref 5–15)
BUN: 25 mg/dL — ABNORMAL HIGH (ref 6–20)
CO2: 26 mmol/L (ref 22–32)
Calcium: 8.1 mg/dL — ABNORMAL LOW (ref 8.9–10.3)
Chloride: 109 mmol/L (ref 101–111)
Creatinine, Ser: 0.74 mg/dL (ref 0.44–1.00)
GFR calc Af Amer: >60 mL/min (ref >60)
GFR calc non Af Amer: >60 mL/min (ref >60)
Glucose, Bld: 101 mg/dL — ABNORMAL HIGH (ref 65–99)
Potassium: 3.6 mmol/L (ref 3.5–5.1)
Sodium: 138 mmol/L (ref 135–145)

## 2015-08-17 LAB — CBC
HCT: 25.7 % — ABNORMAL LOW (ref 36.0–46.0)
Hemoglobin: 8.1 g/dL — ABNORMAL LOW (ref 12.0–15.0)
MCH: 30 pg (ref 26.0–34.0)
MCHC: 31.5 g/dL (ref 30.0–36.0)
MCV: 95.2 fL (ref 78.0–100.0)
Platelets: 165 10*3/uL (ref 150–400)
RBC: 2.7 MIL/uL — ABNORMAL LOW (ref 3.87–5.11)
RDW: 15.6 % — ABNORMAL HIGH (ref 11.5–15.5)
WBC: 5.8 10*3/uL (ref 4.0–10.5)

## 2015-08-17 MED ORDER — FAT EMULSION 20 % IV EMUL
240.0000 mL | INTRAVENOUS | Status: AC
  Administered 2015-08-17: 240 mL via INTRAVENOUS
  Filled 2015-08-17: qty 250

## 2015-08-17 MED ORDER — INSULIN ASPART 100 UNIT/ML ~~LOC~~ SOLN
0.0000 [IU] | Freq: Three times a day (TID) | SUBCUTANEOUS | Status: DC
  Administered 2015-08-19: 1 [IU] via SUBCUTANEOUS

## 2015-08-17 MED ORDER — BISACODYL 10 MG RE SUPP
10.0000 mg | Freq: Once | RECTAL | Status: AC
  Administered 2015-08-17: 10 mg via RECTAL
  Filled 2015-08-17: qty 1

## 2015-08-17 MED ORDER — TRACE MINERALS CR-CU-MN-SE-ZN 10-1000-500-60 MCG/ML IV SOLN
INTRAVENOUS | Status: AC
  Administered 2015-08-17: 18:00:00 via INTRAVENOUS
  Filled 2015-08-17: qty 1440

## 2015-08-17 NOTE — Progress Notes (Signed)
Patient ID: Desiree Hutchinson, female   DOB: 28-May-1920, 81 y.o.   MRN: [id number] [facility] Progress Note:   7 Days Post-Op  Subjective: Mental status is surprising clear.  No pain.  Passing gas Objective: Vital signs in last 24 hours: Temp:  [97.9 F (36.6 C)-99.1 F (37.3 C)] 97.9 F (36.6 C) (08/21 0538) Pulse Rate:  [88-94] 88 (08/21 0538) Resp:  [16-20] 20 (08/21 0538) BP: (121-142)/(53-58) 142/53 mmHg (08/21 0538) SpO2:  [95 %-100 %] 100 % (08/21 0538)  Intake/Output from previous day: 08/20 0701 - 08/21 0700 In: 3908.7 [I.V.:1421.3; TPN:2487.3] Out: 2900 [Urine:2900] Intake/Output this shift: Total I/O In: -  Out: 500 [Urine:500]  Physical Exam: Work of breathing is not labored.  Incision with staples and is bland.  Abdomen is nontender.  Lab Results:  Results for orders placed or performed during the hospital encounter of 08/05/15 (from the past 48 hour(s))  Glucose, capillary     Status: Abnormal   Collection Time: 08/15/15 11:40 AM  Result Value Ref Range   Glucose-Capillary 133 (H) 65 - 99 mg/dL  Glucose, capillary     Status: Abnormal   Collection Time: 08/15/15  4:26 PM  Result Value Ref Range   Glucose-Capillary 125 (H) 65 - 99 mg/dL  Glucose, capillary     Status: Abnormal   Collection Time: 08/15/15  8:04 PM  Result Value Ref Range   Glucose-Capillary 132 (H) 65 - 99 mg/dL  Glucose, capillary     Status: Abnormal   Collection Time: 08/16/15 12:16 AM  Result Value Ref Range   Glucose-Capillary 124 (H) 65 - 99 mg/dL  Phosphorus     Status: None   Collection Time: 08/16/15  5:45 AM  Result Value Ref Range   Phosphorus 3.2 2.5 - 4.6 mg/dL  Glucose, capillary     Status: Abnormal   Collection Time: 08/16/15  6:33 AM  Result Value Ref Range   Glucose-Capillary 111 (H) 65 - 99 mg/dL  Glucose, capillary     Status: Abnormal   Collection Time: 08/16/15 11:37 AM  Result Value Ref Range   Glucose-Capillary 110 (H) 65 - 99 mg/dL  Glucose,  capillary     Status: None   Collection Time: 08/16/15  6:46 PM  Result Value Ref Range   Glucose-Capillary 98 65 - 99 mg/dL  Glucose, capillary     Status: None   Collection Time: 08/16/15 11:47 PM  Result Value Ref Range   Glucose-Capillary 90 65 - 99 mg/dL  Basic metabolic panel     Status: Abnormal   Collection Time: 08/17/15  5:25 AM  Result Value Ref Range   Sodium 138 135 - 145 mmol/L   Potassium 3.6 3.5 - 5.1 mmol/L   Chloride 109 101 - 111 mmol/L   CO2 26 22 - 32 mmol/L   Glucose, Bld 101 (H) 65 - 99 mg/dL   BUN 25 (H) 6 - 20 mg/dL   Creatinine, Ser 0.74 0.44 - 1.00 mg/dL   Calcium 8.1 (L) 8.9 - 10.3 mg/dL   GFR calc non Af Amer >60 >60 mL/min   GFR calc Af Amer >60 >60 mL/min    Comment: (NOTE) The eGFR has been calculated using the CKD EPI equation. This calculation has not been validated in all clinical situations. eGFR's persistently <60 mL/min signify possible Chronic Kidney Disease.    Anion gap 3 (L) 5 - 15  CBC     Status: Abnormal   Collection Time: 08/17/15  5:25  AM  Result Value Ref Range   WBC 5.8 4.0 - 10.5 K/uL   RBC 2.70 (L) 3.87 - 5.11 MIL/uL   Hemoglobin 8.1 (L) 12.0 - 15.0 g/dL   HCT 25.7 (L) 36.0 - 46.0 %   MCV 95.2 78.0 - 100.0 fL   MCH 30.0 26.0 - 34.0 pg   MCHC 31.5 30.0 - 36.0 g/dL   RDW 15.6 (H) 11.5 - 15.5 %   Platelets 165 150 - 400 K/uL  Glucose, capillary     Status: None   Collection Time: 08/17/15  5:37 AM  Result Value Ref Range   Glucose-Capillary 96 65 - 99 mg/dL    Radiology/Results: Dg Abd Portable 1v  08/15/2015   CLINICAL DATA:  Abdominal pain. Nausea and vomiting. Initial encounter.  EXAM: PORTABLE ABDOMEN - 1 VIEW  COMPARISON:  08/10/2015.  FINDINGS: Gaseous distension of large and small bowel is present. Midline abdominal staples are present compatible with laparotomy. No gross plain film evidence of free air. There may be a tiny amount of gas in the region of the rectosigmoid. Gaseous distension is diffuse.  IMPRESSION:  Diffuse gaseous distension of bowel, most compatible with postoperative ileus.   Electronically Signed   By: Dereck Ligas M.D.   On: 08/15/2015 13:45    Anti-infectives: Anti-infectives    Start     Dose/Rate Route Frequency Ordered Stop   08/10/15 0931  cefOXitin (MEFOXIN) 2 g in dextrose 5 % 50 mL IVPB     2 g 100 mL/hr over 30 Minutes Intravenous 30 min pre-op 08/10/15 8403 08/10/15 1039      Assessment/Plan: Problem List: Patient Active Problem List   Diagnosis Date Noted  . SBO (small bowel obstruction) 08/06/2015  . AKI (acute kidney injury) 08/06/2015  . Protein-calorie malnutrition 07/21/2015  . Lower GI bleed 07/19/2015  . Diverticulosis of colon with hemorrhage 07/19/2015  . Acute blood loss anemia 07/19/2015  . Constipation 01/10/2015  . Loss of weight 01/10/2015  . Memory loss of 01/10/2015  . Hearing loss 01/08/2014  . Renal insufficiency 03/27/2013  . Encounter for long-term (current) use of other medications 03/27/2013  . Aortic valve disorder 06/15/2010  . CHRONIC RHINITIS 06/26/2009  . SHORTNESS OF BREATH 05/29/2009  . OTHER DYSPNEA AND RESPIRATORY ABNORMALITIES 05/29/2009  . PRECORDIAL PAIN 05/29/2009  . COPD with emphysema Gold Stage A 05/28/2009  . SMALL BOWEL OBSTRUCTION, HX OF 05/28/2009  . GASTRITIS 03/28/2008  . COUGH 12/07/2007  . OBESITY 10/14/2007  . Essential hypertension 10/14/2007  . Cardiovascular disease 10/14/2007  . VENTRICULAR HYPERTROPHY, LEFT 10/14/2007  . ALLERGIC RHINITIS 10/14/2007  . CHEST PAIN, NON-CARDIAC 10/14/2007  . TOBACCO USE, QUIT 10/14/2007    Will begin clear liquids PO.  Dulcolax suppository to stimulate BM (none since hospitalized) 7 Days Post-Op    LOS: 11 days   Matt B. Hassell Done, MD, [facility] [phone] beeper [phone]  08/17/2015 9:11 AM

## 2015-08-17 NOTE — Progress Notes (Signed)
Pt given per MD Orders Dulcolax Supp. Per rectum this am. Pt has passed some flatus but only one episode of a very small smear of brown stool noted during the day. Maintain current plan of care.

## 2015-08-17 NOTE — Progress Notes (Signed)
PARENTERAL NUTRITION CONSULT NOTE - Follow up  Pharmacy Consult for TPN Indication: SBO  No Known Allergies  Patient Measurements: Height:  (149.9 cm) Weight: 110 lb 3.7 oz (50 kg) IBW/kg (Calculated) : 43.2  Vital Signs: Temp: 97.9 F (36.6 C) (08/21 0538) Temp Source: Oral (08/21 0538) BP: 142/53 mmHg (08/21 0538) Pulse Rate: 88 (08/21 0538) Intake/Output from previous day: 08/20 0701 - 08/21 0700 In: 3908.7 [I.V.:1421.3; TPN:2487.3] Out: 2900 [Urine:2900] Intake/Output from this shift: Total I/O In: -  Out: 250 [Urine:250]  Labs:  Recent Labs  08/17/15 0525  WBC 5.8  HGB 8.1*  HCT 25.7*  PLT 165    Recent Labs  08/16/15 0545 08/17/15 0525  NA  --  138  K  --  3.6  CL  --  109  CO2  --  26  GLUCOSE  --  101*  BUN  --  25*  CREATININE  --  0.74  CALCIUM  --  8.1*  PHOS 3.2  --    Estimated Creatinine Clearance: 30 mL/min (by C-G formula based on Cr of 0.74).    Recent Labs  08/16/15 1846 08/16/15 2347 08/17/15 0537  GLUCAP 98 90 96    Medical History: Past Medical History  Diagnosis Date  . History of atherosclerotic cardiovascular disease   . Hypertension   . Left ventricular hypertrophy   . Gastritis   . Cough   . Personal history of tobacco use, presenting hazards to health   . Obesity   . Chest pain, non-cardiac   . Allergic rhinitis   . COPD (chronic obstructive pulmonary disease)   . History of small bowel obstruction   . Chronic diastolic heart failure   . Aortic valve disease     regurge > stenosis   Medications:  Scheduled:  . antiseptic oral rinse  7 mL Mouth Rinse q12n4p  . chlorhexidine  15 mL Mouth Rinse BID  . feeding supplement  1 Container Oral TID BM  . heparin  5,000 Units Subcutaneous 3 times per day  . insulin aspart  0-9 Units Subcutaneous 4 times per day  . sodium chloride  10-40 mL Intracatheter Q12H  . tiotropium  1 capsule Inhalation Daily   Infusions:  . Marland KitchenTPN (CLINIMIX-E) Adult 60 mL/hr at  08/16/15 1754   And  . fat emulsion 240 mL (08/16/15 1754)  . sodium chloride 40 mL/hr at 08/15/15 0053   Insulin Requirements: 1 unit Novolog on 8/20  Current Nutrition: NPO + TPN  IVF: NS at 40 ml/hr  Central access:  8/14 TPN start date:  8/15  ASSESSMENT                                                                                                          HPI: 7 yoF admit 8/10 with abd pain, hx of diverticular disease, SBO. On 8/14 underwent Expl Lap & LOA for persistent SBO after 4 days conservative management. Begin TPN 8/15, Pharmacy to dose.  Significant events:  8/15: TPN started 8/18: Didn't tolerate full liquids that well (  didn't like menu items), so going to regular diet. 8/19: experienced indigestion followed by emesis on regular diet; back to NPO.  Today:   Glucose: CBGs at goal < 150  Electrolytes: wnl, CorrCa 9.2. Phos wnl 8/20.  Renal: wnl. CrCl 30 d/t advanced age and low weight.  LFTs: wnl  TGs: 87  Prealbumin: 7.6  NUTRITIONAL GOALS                                                                                             RD recs: 1250-1450 KCal, 65-75g protein, 1.5 L/day. Clinimix E 5/15 at a goal rate of 60 ml/hr + 20% fat emulsion at 10 ml/hr to provide: 72 g/day protein, 1500 Kcal/day.  PLAN                                                                                                                         At 1800 today:  Cont Clinimix E 5/15 at 60 ml/hr(goal rate).  20% fat emulsion at 10 ml/hr.  TPN to contain standard multivitamins and trace elements.  Cont IVF at 40 ml/hr.  Reduce Sensitive SSI to q8h.  TPN lab panels on Mondays & Thursdays.  F/u daily.  Charolotte Eke, PharmD, pager [phone]. 08/17/2015,8:16 AM.

## 2015-08-17 NOTE — Progress Notes (Signed)
[facility] PROGRESS NOTE  Desiree Hutchinson UJW:[id number] DOB: 09-03-1920 DOA: 08/05/2015 PCP: [name], MD  HPI: 81 year old patient with diverticular disease, HTN, LVH, COPD and aortic stenosis presented to the ED on 08/06/15 for diffusing, cramping abdominal pain that began on 08/05/15 after eating dinner. She denies nausea, vomiting, fever and chills. Pain fluctuates in intensity up to 8/10. She had a normal bowel movement on 08/05/15. Does note her bowel movements have been darker since taking iron supplements. She also complains of generalized weakness x 2 weeks. She has a history of 2 previous abdominal surgeries and does not have a history of colon cancer. She was recently hospitalized 7/23-7/27/16 for diverticulosis of the colon with hemorrhage and required a transfusion for anemia. Abdominal CT in the ED on 08/06/15 demonstrated findings consistent with a small bowel obstruction with no abscesses or perforation.  Patient was initially managed conservatively with NPO, NG tube and IVF, however failed to improve and she was taken to OR on 8/14. She was observed in SDU post op and transferred to floor on 8/15.  NG was successfully clamped on 8/16 and pt started on clears. She noted increasing abd pains with nausea/vomiting on 8/19 with abd xray demonstrating post-op ileus. The patient was kept NPO. The patient was noted to have bowel sounds and was passing flatus. She was restarted on clears.  Assessment/Plan: Small bowel obstruction - patient underwent ex lap with LOA on 8/14 per general surgery - NG tube was d/c'd post-op with diet being advanced - Patient has reported continued pain with food. With nausea/vomiting today. - Abd xray has demonstrated post-op ileus. - Surgery continues to follow - Pos BS on exam. Clears started per General Surgery with cathartics ordered  Post-Op Ileus - Per above. Noted on abd xray - pos bs on exam. Cathartics ordered. Clears  resumed  Hypertension - PRN IV hydralazine if SBP >180   Acute Kidney Injury - Likely due to dehydration.  - Cr remains stable, monitor renal panel  COPD - Currently remains stable, no wheezing  Protein-Calorie Malnutrition - TPN is continued - Patient remains NPO currently  Recent GI bleed - Monitor with serial CBCs, no evidence of bleeding currently  ABLA - post op, monitor CBC  Code Status: Full Family Communication: Pt in room Disposition Plan: Pending  Consultants:  General Surgery  Procedures:  Ex lap with lysis of adhesion 8/14  Antibiotics:    HPI/Subjective: States feeling better today after passing flatus.  Objective: Filed Vitals:   08/16/15 1336 08/16/15 2216 08/17/15 0538 08/17/15 1351  BP: 121/55 126/58 142/53 133/50  Pulse: 92 94 88 92  Temp: 98.5 F (36.9 C) 99.1 F (37.3 C) 97.9 F (36.6 C) 98.4 F (36.9 C)  TempSrc: Oral Oral Oral Oral  Resp: 16 20 20 20   Height:      Weight:      SpO2: 95% 97% 100% 99%    Intake/Output Summary (Last 24 hours) at 08/17/15 1623 Last data filed at 08/17/15 1506  Gross per 24 hour  Intake   1690 ml  Output   2750 ml  Net  -1060 ml   Filed Weights   08/11/15 1147 08/15/15 0447 08/16/15 0537  Weight: 49.3 kg (108 lb 11 oz) 50.1 kg (110 lb 7.2 oz) 50 kg (110 lb 3.7 oz)    Exam:   General:  Awake, laying in bed, in nad  Cardiovascular: regular, s1, s2  Respiratory: normal resp effort, no wheezing  Abdomen: soft, generally tender,  pos BS, less distended  Musculoskeletal: perfused, no clubbing   Data Reviewed: Basic Metabolic Panel:  Recent Labs Lab 08/11/15 0522 08/11/15 0900 08/12/15 0420 08/14/15 0520 08/16/15 0545 08/17/15 0525  NA 144  --  140 139  --  138  K 4.5  --  3.5 3.6  --  3.6  CL 115*  --  111 107  --  109  CO2 20*  --  25 26  --  26  GLUCOSE 104*  --  155* 105*  --  101*  BUN 25*  --  20 21*  --  25*  CREATININE 1.03*  --  0.77 0.69  --  0.74  CALCIUM 8.7*   --  8.4* 8.5*  --  8.1*  MG  --  1.9 2.1 2.0  --   --   PHOS  --  3.2 1.8* 3.1 3.2  --    Liver Function Tests:  Recent Labs Lab 08/12/15 0420 08/14/15 0520  AST 17 23  ALT 10* 11*  ALKPHOS 44 42  BILITOT 0.5 0.4  PROT 4.6* 5.0*  ALBUMIN 2.5* 2.6*   No results for input(s): LIPASE, AMYLASE in the last 168 hours. No results for input(s): AMMONIA in the last 168 hours. CBC:  Recent Labs Lab 08/11/15 0522 08/12/15 0420 08/17/15 0525  WBC 9.0 7.3 5.8  HGB 9.8* 8.4* 8.1*  HCT 30.6* 26.0* 25.7*  MCV 93.9 93.5 95.2  PLT 151 128* 165   Cardiac Enzymes: No results for input(s): CKTOTAL, CKMB, CKMBINDEX, TROPONINI in the last 168 hours. BNP (last 3 results) No results for input(s): BNP in the last 8760 hours.  ProBNP (last 3 results) No results for input(s): PROBNP in the last 8760 hours.  CBG:  Recent Labs Lab 08/16/15 1137 08/16/15 1846 08/16/15 2347 08/17/15 0537 08/17/15 1151  GLUCAP 110* 98 90 96 109*    Recent Results (from the past 240 hour(s))  Surgical pcr screen     Status: None   Collection Time: 08/10/15  9:40 AM  Result Value Ref Range Status   MRSA, PCR NEGATIVE NEGATIVE Final   Staphylococcus aureus NEGATIVE NEGATIVE Final    Comment:        The Xpert SA Assay (FDA approved for NASAL specimens in patients over 31 years of age), is one component of a comprehensive surveillance program.  Test performance has been validated by [facility] for patients greater than or equal to 75 year old. It is not intended to diagnose infection nor to guide or monitor treatment.      Studies: No results found.  Scheduled Meds: . antiseptic oral rinse  7 mL Mouth Rinse q12n4p  . chlorhexidine  15 mL Mouth Rinse BID  . feeding supplement  1 Container Oral TID BM  . heparin  5,000 Units Subcutaneous 3 times per day  . insulin aspart  0-9 Units Subcutaneous 3 times per day  . sodium chloride  10-40 mL Intracatheter Q12H  . tiotropium  1 capsule  Inhalation Daily   Continuous Infusions: . Marland KitchenTPN (CLINIMIX-E) Adult 60 mL/hr at 08/16/15 1754   And  . fat emulsion 240 mL (08/16/15 1754)  . Marland KitchenTPN (CLINIMIX-E) Adult     And  . fat emulsion    . sodium chloride 40 mL/hr at 08/15/15 0053    Principal Problem:   SBO (small bowel obstruction) Active Problems:   Essential hypertension   Protein-calorie malnutrition   AKI (acute kidney injury)   Dala Breault K  [facility] Pager [phone]. If 7PM-7AM, please contact night-coverage at [email or web address], password [facility] 08/17/2015, 4:23 PM  LOS: 11 days

## 2015-08-18 LAB — COMPREHENSIVE METABOLIC PANEL
ALT: 18 U/L (ref 14–54)
AST: 24 U/L (ref 15–41)
Albumin: 2.4 g/dL — ABNORMAL LOW (ref 3.5–5.0)
Alkaline Phosphatase: 49 U/L (ref 38–126)
Anion gap: 5 (ref 5–15)
BUN: 23 mg/dL — ABNORMAL HIGH (ref 6–20)
CO2: 26 mmol/L (ref 22–32)
Calcium: 8.2 mg/dL — ABNORMAL LOW (ref 8.9–10.3)
Chloride: 108 mmol/L (ref 101–111)
Creatinine, Ser: 0.72 mg/dL (ref 0.44–1.00)
GFR calc Af Amer: >60 mL/min (ref >60)
GFR calc non Af Amer: >60 mL/min (ref >60)
Glucose, Bld: 105 mg/dL — ABNORMAL HIGH (ref 65–99)
Potassium: 3.5 mmol/L (ref 3.5–5.1)
Sodium: 139 mmol/L (ref 135–145)
Total Bilirubin: 0.5 mg/dL (ref 0.3–1.2)
Total Protein: 4.6 g/dL — ABNORMAL LOW (ref 6.5–8.1)

## 2015-08-18 LAB — DIFFERENTIAL
Basophils Absolute: 0 10*3/uL (ref 0.0–0.1)
Basophils Relative: 0 % (ref 0–1)
Eosinophils Absolute: 0.2 10*3/uL (ref 0.0–0.7)
Eosinophils Relative: 4 % (ref 0–5)
Lymphocytes Relative: 23 % (ref 12–46)
Lymphs Abs: 1.3 10*3/uL (ref 0.7–4.0)
Monocytes Absolute: 0.6 10*3/uL (ref 0.1–1.0)
Monocytes Relative: 10 % (ref 3–12)
Neutro Abs: 3.7 10*3/uL (ref 1.7–7.7)
Neutrophils Relative %: 63 % (ref 43–77)

## 2015-08-18 LAB — PHOSPHORUS: Phosphorus: 3.6 mg/dL (ref 2.5–4.6)

## 2015-08-18 LAB — GLUCOSE, CAPILLARY
Glucose-Capillary: 98 mg/dL (ref 65–99)
Glucose-Capillary: 115 mg/dL — ABNORMAL HIGH (ref 65–99)

## 2015-08-18 LAB — TRIGLYCERIDES: Triglycerides: 68 mg/dL (ref <150)

## 2015-08-18 LAB — URINALYSIS, ROUTINE W REFLEX MICROSCOPIC
Bilirubin Urine: NEGATIVE
Glucose, UA: NEGATIVE mg/dL
Hgb urine dipstick: NEGATIVE
Ketones, ur: NEGATIVE mg/dL
Leukocytes, UA: NEGATIVE
Nitrite: NEGATIVE
Protein, ur: NEGATIVE mg/dL
Specific Gravity, Urine: 1.01 (ref 1.005–1.030)
Urobilinogen, UA: 0.2 mg/dL (ref 0.0–1.0)
pH: 7 (ref 5.0–8.0)

## 2015-08-18 LAB — CBC
HCT: 26.9 % — ABNORMAL LOW (ref 36.0–46.0)
Hemoglobin: 8.8 g/dL — ABNORMAL LOW (ref 12.0–15.0)
MCH: 31.1 pg (ref 26.0–34.0)
MCHC: 32.7 g/dL (ref 30.0–36.0)
MCV: 95.1 fL (ref 78.0–100.0)
Platelets: 203 10*3/uL (ref 150–400)
RBC: 2.83 MIL/uL — ABNORMAL LOW (ref 3.87–5.11)
RDW: 15.6 % — ABNORMAL HIGH (ref 11.5–15.5)
WBC: 5.7 10*3/uL (ref 4.0–10.5)

## 2015-08-18 LAB — MAGNESIUM: Magnesium: 2.1 mg/dL (ref 1.7–2.4)

## 2015-08-18 LAB — PREALBUMIN: Prealbumin: 10.1 mg/dL — ABNORMAL LOW (ref 18–38)

## 2015-08-18 MED ORDER — POTASSIUM CHLORIDE 10 MEQ/100ML IV SOLN
10.0000 meq | INTRAVENOUS | Status: AC
  Administered 2015-08-18 (×2): 10 meq via INTRAVENOUS
  Filled 2015-08-18 (×2): qty 100

## 2015-08-18 MED ORDER — FAT EMULSION 20 % IV EMUL
240.0000 mL | INTRAVENOUS | Status: DC
  Administered 2015-08-18: 250 mL via INTRAVENOUS
  Filled 2015-08-18: qty 250

## 2015-08-18 MED ORDER — TRACE MINERALS CR-CU-MN-SE-ZN 10-1000-500-60 MCG/ML IV SOLN
INTRAVENOUS | Status: DC
  Administered 2015-08-18: 18:00:00 via INTRAVENOUS
  Filled 2015-08-18: qty 1440

## 2015-08-18 NOTE — Care Management Important Message (Signed)
Important Message  Patient Details  Name: Desiree Hutchinson MRN: [id number] Date of Birth: October 29, 1920   Medicare Important Message Given:  Yes-second notification given    Lanier Clam, RN 08/18/2015, 11:31 AM

## 2015-08-18 NOTE — Progress Notes (Signed)
[facility] PROGRESS NOTE  Desiree Hutchinson XBJ:[id number] DOB: 1920/02/06 DOA: 08/05/2015 PCP: [name], MD  HPI: 81 year old patient with diverticular disease, HTN, LVH, COPD and aortic stenosis presented to the ED on 08/06/15 for diffusing, cramping abdominal pain that began on 08/05/15 after eating dinner. She denies nausea, vomiting, fever and chills. Pain fluctuates in intensity up to 8/10. She had a normal bowel movement on 08/05/15. Does note her bowel movements have been darker since taking iron supplements. She also complains of generalized weakness x 2 weeks. She has a history of 2 previous abdominal surgeries and does not have a history of colon cancer. She was recently hospitalized 7/23-7/27/16 for diverticulosis of the colon with hemorrhage and required a transfusion for anemia. Abdominal CT in the ED on 08/06/15 demonstrated findings consistent with a small bowel obstruction with no abscesses or perforation.  Patient was initially managed conservatively with NPO, NG tube and IVF, however failed to improve and she was taken to OR on 8/14. She was observed in SDU post op and transferred to floor on 8/15.  NG was successfully clamped on 8/16 and pt started on clears. She noted increasing abd pains with nausea/vomiting on 8/19 with abd xray demonstrating post-op ileus. The patient was kept NPO. The patient was noted to have bowel sounds and was passing flatus. She was restarted on clears.  Assessment/Plan: Small bowel obstruction - patient underwent ex lap with LOA on 8/14 per general surgery - NG tube was d/c'd post-op with diet being advanced - Patient has reported continued pain with food. With nausea/vomiting today. - Abd xray has demonstrated post-op ileus. - Surgery continues to follow - Pos BS on exam. Diet slowly being advanced to fulls today per Surgery  Post-Op Ileus - Per above. Noted on abd xray - Pos bs on exam. Cathartics ordered. Clears resumed  Hypertension - PRN  IV hydralazine if SBP >180   Acute Kidney Injury - Likely due to dehydration.  - Cr remains stable, monitor renal panel  COPD - Currently remains stable, no wheezing  Protein-Calorie Malnutrition - TPN is continued - Slowly advancing diet  Recent GI bleed - Monitor with serial CBCs, no evidence of bleeding currently  ABLA - post op, monitor CBC  Code Status: Full Family Communication: Pt in room Disposition Plan: Pending  Consultants:  General Surgery  Procedures:  Ex lap with lysis of adhesion 8/14  Antibiotics:    HPI/Subjective: Reports feeling better today and is passing flatus. Still complains of mild post-op pain thought  Objective: Filed Vitals:   08/17/15 2137 08/17/15 2339 08/18/15 0421 08/18/15 1120  BP: 189/66 150/55 143/64   Pulse: 85 88 91   Temp: 98.1 F (36.7 C) 98.3 F (36.8 C) 98 F (36.7 C)   TempSrc: Oral Oral Oral   Resp: Height:      Weight:      SpO2: 100% 98% 97% 98%    Intake/Output Summary (Last 24 hours) at 08/18/15 1310 Last data filed at 08/18/15 1210  Gross per 24 hour  Intake    200 ml  Output   3875 ml  Net  -3675 ml   Filed Weights   08/11/15 1147 08/15/15 0447 08/16/15 0537  Weight: 49.3 kg (108 lb 11 oz) 50.1 kg (110 lb 7.2 oz) 50 kg (110 lb 3.7 oz)    Exam:   General:  Awake, laying in bed, in nad  Cardiovascular: regular, s1, s2  Respiratory: normal resp effort, no  wheezing  Abdomen: soft, pos bowel sounds, generally tender  Musculoskeletal: perfused, no clubbing   Data Reviewed: Basic Metabolic Panel:  Recent Labs Lab 08/12/15 0420 08/14/15 0520 08/16/15 0545 08/17/15 0525 08/18/15 0531  NA 140 139  --  138 139  K 3.5 3.6  --  3.6 3.5  CL 111 107  --  109 108  CO2 25 26  --  26 26  GLUCOSE 155* 105*  --  101* 105*  BUN 20 21*  --  25* 23*  CREATININE 0.77 0.69  --  0.74 0.72  CALCIUM 8.4* 8.5*  --  8.1* 8.2*  MG 2.1 2.0  --   --  2.1  PHOS 1.8* 3.1 3.2  --  3.6   Liver  Function Tests:  Recent Labs Lab 08/12/15 0420 08/14/15 0520 08/18/15 0531  AST 17 23 24   ALT 10* 11* 18  ALKPHOS 44 42 49  BILITOT 0.5 0.4 0.5  PROT 4.6* 5.0* 4.6*  ALBUMIN 2.5* 2.6* 2.4*   No results for input(s): LIPASE, AMYLASE in the last 168 hours. No results for input(s): AMMONIA in the last 168 hours. CBC:  Recent Labs Lab 08/12/15 0420 08/17/15 0525 08/18/15 0531  WBC 7.3 5.8 5.7  NEUTROABS  --   --  3.7  HGB 8.4* 8.1* 8.8*  HCT 26.0* 25.7* 26.9*  MCV 93.5 95.2 95.1  PLT 128* 165 203   Cardiac Enzymes: No results for input(s): CKTOTAL, CKMB, CKMBINDEX, TROPONINI in the last 168 hours. BNP (last 3 results) No results for input(s): BNP in the last 8760 hours.  ProBNP (last 3 results) No results for input(s): PROBNP in the last 8760 hours.  CBG:  Recent Labs Lab 08/17/15 0537 08/17/15 1151 08/17/15 1619 08/17/15 2336 08/18/15 0743  GLUCAP 96 109* 104* 101* 98    Recent Results (from the past 240 hour(s))  Surgical pcr screen     Status: None   Collection Time: 08/10/15  9:40 AM  Result Value Ref Range Status   MRSA, PCR NEGATIVE NEGATIVE Final   Staphylococcus aureus NEGATIVE NEGATIVE Final    Comment:        The Xpert SA Assay (FDA approved for NASAL specimens in patients over 54 years of age), is one component of a comprehensive surveillance program.  Test performance has been validated by [facility] for patients greater than or equal to 50 year old. It is not intended to diagnose infection nor to guide or monitor treatment.      Studies: No results found.  Scheduled Meds: . antiseptic oral rinse  7 mL Mouth Rinse q12n4p  . chlorhexidine  15 mL Mouth Rinse BID  . feeding supplement  1 Container Oral TID BM  . heparin  5,000 Units Subcutaneous 3 times per day  . insulin aspart  0-9 Units Subcutaneous 3 times per day  . sodium chloride  10-40 mL Intracatheter Q12H  . tiotropium  1 capsule Inhalation Daily   Continuous  Infusions: . Marland KitchenTPN (CLINIMIX-E) Adult 60 mL/hr at 08/17/15 1745   And  . fat emulsion 240 mL (08/17/15 1745)  . Marland KitchenTPN (CLINIMIX-E) Adult     And  . fat emulsion    . sodium chloride 40 mL/hr at 08/15/15 0053    Principal Problem:   SBO (small bowel obstruction) Active Problems:   Essential hypertension   Protein-calorie malnutrition   AKI (acute kidney injury)   Jaxsen Bernhart K  [facility] Pager [phone]. If 7PM-7AM, please contact night-coverage at [email or web address],  password [email or web address] 08/18/2015, 1:10 PM  LOS: 12 days

## 2015-08-18 NOTE — Progress Notes (Signed)
Pt continues to have frequent urination.  Voiding approximately 250cc each occurrence.  Bladder scanned performed. Resulted >200cc. MD notified. Maeola Harman

## 2015-08-18 NOTE — Progress Notes (Signed)
PARENTERAL NUTRITION CONSULT NOTE - Follow up  Pharmacy Consult for TPN Indication: SBO  No Known Allergies  Patient Measurements: Height:  (149.9 cm) Weight: 110 lb 3.7 oz (50 kg) IBW/kg (Calculated) : 43.2  Vital Signs: Temp: 98 F (36.7 C) (08/22 0421) Temp Source: Oral (08/22 0421) BP: 143/64 mmHg (08/22 0421) Pulse Rate: 91 (08/22 0421) Intake/Output from previous day: 08/21 0701 - 08/22 0700 In: -  Out: 3725 [Urine:3725] Intake/Output from this shift: Total I/O In: -  Out: 450 [Urine:450]  Labs:  Recent Labs  08/17/15 0525 08/18/15 0531  WBC 5.8 5.7  HGB 8.1* 8.8*  HCT 25.7* 26.9*  PLT 165 203    Recent Labs  08/16/15 0545 08/17/15 0525 08/18/15 0531  NA  --  138 139  K  --  3.6 3.5  CL  --  109 108  CO2  --  26 26  GLUCOSE  --  101* 105*  BUN  --  25* 23*  CREATININE  --  0.74 0.72  CALCIUM  --  8.1* 8.2*  MG  --   --  2.1  PHOS 3.2  --  3.6  PROT  --   --  4.6*  ALBUMIN  --   --  2.4*  AST  --   --  24  ALT  --   --  18  ALKPHOS  --   --  49  BILITOT  --   --  0.5  PREALBUMIN  --   --  10.1*   Estimated Creatinine Clearance: 30 mL/min (by C-G formula based on Cr of 0.72).    Recent Labs  08/17/15 1619 08/17/15 2336 08/18/15 0743  GLUCAP 104* 101* 98    Medical History: Past Medical History  Diagnosis Date  . History of atherosclerotic cardiovascular disease   . Hypertension   . Left ventricular hypertrophy   . Gastritis   . Cough   . Personal history of tobacco use, presenting hazards to health   . Obesity   . Chest pain, non-cardiac   . Allergic rhinitis   . COPD (chronic obstructive pulmonary disease)   . History of small bowel obstruction   . Chronic diastolic heart failure   . Aortic valve disease     regurge > stenosis   Medications:  Scheduled:  . antiseptic oral rinse  7 mL Mouth Rinse q12n4p  . chlorhexidine  15 mL Mouth Rinse BID  . feeding supplement  1 Container Oral TID BM  . heparin  5,000 Units  Subcutaneous 3 times per day  . insulin aspart  0-9 Units Subcutaneous 3 times per day  . sodium chloride  10-40 mL Intracatheter Q12H  . tiotropium  1 capsule Inhalation Daily   Infusions:  . Marland KitchenTPN (CLINIMIX-E) Adult 60 mL/hr at 08/17/15 1745   And  . fat emulsion 240 mL (08/17/15 1745)  . sodium chloride 40 mL/hr at 08/15/15 0053   Insulin Requirements: no insulin in the past 24 hrs (on SSI q8h)  Current Nutrition:  TPN - BOOST TID started on 8/18 (pt taking some doses) - clear liquid diet started on 8/21  IVF: NS at 40 ml/hr  Central access:  8/14 TPN start date:  8/15  ASSESSMENT  HPI: 58 yoF admit 8/10 with abd pain, hx of diverticular disease, SBO. On 8/14 underwent Expl Lap & LOA for persistent SBO after 4 days conservative management. Begin TPN 8/15, Pharmacy to dose.  Significant events:  8/15: TPN started 8/18: Didn't tolerate full liquids that well (didn't like menu items), so going to regular diet. 8/19: experienced indigestion followed by emesis on regular diet; back to NPO. 8/22: starting clear liquid diet; Pt reported BM on 8/21  Today:   Glucose: CBGs at goal < 150  Electrolytes: k 3.5; CorrCa,  Phos and Mag wnl  Renal: wnl. CrCl 30 d/t advanced age and low weight.  LFTs: wnl  TGs: 87  Prealbumin: 10.1  NUTRITIONAL GOALS                                                                                             RD recs: 1250-1450 KCal, 65-75g protein, 1.5 L/day. Clinimix E 5/15 at a goal rate of 60 ml/hr + 20% fat emulsion at 10 ml/hr to provide: 72 g/day protein, 1500 Kcal/day.  PLAN                                                                                                                         1)  potassium chloride 10 mEq IV x2 2)  At 1800 today:  Cont Clinimix E 5/15 at 60 ml/hr(goal rate).  20% fat emulsion at 10 ml/hr.  TPN to  contain standard multivitamins and trace elements.  Cont IVF at 40 ml/hr.  continue Sensitive SSI to q8h.  TPN lab panels on Mondays & Thursdays.  F/u daily.  Dorna Leitz, PharmD, BCPS 08/18/2015 8:32 AM

## 2015-08-18 NOTE — Progress Notes (Signed)
PT Cancellation Note  Patient Details Name: Desiree Hutchinson MRN: [id number] DOB: 03-11-1920   Cancelled Treatment:    Reason Eval/Treat Not Completed: Fatigue/lethargy limiting ability to participate (pt reports foley just placed and feeling exhausted, agreeable to mobilize next visit)   Chadrick Sprinkle,KATHrine E 08/18/2015, 3:31 PM Zenovia Jarred, PT, DPT 08/18/2015 Pager: [phone]

## 2015-08-18 NOTE — Care Management Note (Signed)
Case Management Note  Patient Details  Name: Desiree Hutchinson MRN: [id number] Date of Birth: 1920/10/26  Subjective/Objective:   POD#8, fulls,advancing diet,TPN@60cc /hr.d/c SNF. Per dr/ccs not interested in LTAC.                Action/Plan:d/c SNF.   Expected Discharge Date:   (UNKNOWN)               Expected Discharge Plan:  Skilled Nursing Facility  In-House Referral:  NA  Discharge planning Services  CM Consult  Post Acute Care Choice:  NA Choice offered to:  Patient  DME Arranged:  N/A DME Agency:  NA  HH Arranged:    HH Agency:  NA  Status of Service:  In process, will continue to follow  Medicare Important Message Given:  Yes-third notification given Date Medicare IM Given:    Medicare IM give by:    Date Additional Medicare IM Given:    Additional Medicare Important Message give by:     If discussed at Long Length of Stay Meetings, dates discussed:    Additional Comments:  Lanier Clam, RN 08/18/2015, 11:27 AM

## 2015-08-18 NOTE — Progress Notes (Signed)
8 Days Post-Op  Subjective: Passing gas and tolerating clear liquid diet.  Objective: Vital signs in last 24 hours: Temp:  [98 F (36.7 C)-98.4 F (36.9 C)] 98 F (36.7 C) (08/22 0421) Pulse Rate:  [85-92] 91 (08/22 0421) Resp:  [20] 20 (08/22 0421) BP: (133-189)/(50-66) 143/64 mmHg (08/22 0421) SpO2:  [97 %-100 %] 97 % (08/22 0421) Last BM Date: 08/17/15 (per pt)  Intake/Output from previous day: 08/21 0701 - 08/22 0700 In: -  Out: 3725 [Urine:3725] Intake/Output this shift: Total I/O In: -  Out: 650 [Urine:650]  PE: General- In NAD Abdomen-soft, tender around midline incision was is clean and intact  Lab Results:   Recent Labs  08/17/15 0525 08/18/15 0531  WBC 5.8 5.7  HGB 8.1* 8.8*  HCT 25.7* 26.9*  PLT 165 203   BMET  Recent Labs  08/17/15 0525 08/18/15 0531  NA 138 139  K 3.6 3.5  CL 109 108  CO2 26 26  GLUCOSE 101* 105*  BUN 25* 23*  CREATININE 0.74 0.72  CALCIUM 8.1* 8.2*   PT/INR No results for input(s): LABPROT, INR in the last 72 hours. Comprehensive Metabolic Panel:    Component Value Date/Time   NA 139 08/18/2015 0531   NA 138 08/17/2015 0525   K 3.5 08/18/2015 0531   K 3.6 08/17/2015 0525   CL 108 08/18/2015 0531   CL 109 08/17/2015 0525   CO2 26 08/18/2015 0531   CO2 26 08/17/2015 0525   BUN 23* 08/18/2015 0531   BUN 25* 08/17/2015 0525   CREATININE 0.72 08/18/2015 0531   CREATININE 0.74 08/17/2015 0525   GLUCOSE 105* 08/18/2015 0531   GLUCOSE 101* 08/17/2015 0525   GLUCOSE 94 10/27/2006 1030   CALCIUM 8.2* 08/18/2015 0531   CALCIUM 8.1* 08/17/2015 0525   AST 24 08/18/2015 0531   AST 23 08/14/2015 0520   ALT 18 08/18/2015 0531   ALT 11* 08/14/2015 0520   ALKPHOS 49 08/18/2015 0531   ALKPHOS 42 08/14/2015 0520   BILITOT 0.5 08/18/2015 0531   BILITOT 0.4 08/14/2015 0520   PROT 4.6* 08/18/2015 0531   PROT 5.0* 08/14/2015 0520   ALBUMIN 2.4* 08/18/2015 0531   ALBUMIN 2.6* 08/14/2015 0520     Studies/Results: No  results found.  Anti-infectives: Anti-infectives    Start     Dose/Rate Route Frequency Ordered Stop   08/10/15 0931  cefOXitin (MEFOXIN) 2 g in dextrose 5 % 50 mL IVPB     2 g 100 mL/hr over 30 Minutes Intravenous 30 min pre-op 08/10/15 1610 08/10/15 1039      Assessment Principal Problem:   SBO (small bowel obstruction) s/p x-lap and LOA 08/10/15 (Dr. Veva Holes function has been returning Active Problems:     Protein-calorie malnutrition-on TPN and clear liquids       LOS: 12 days   Plan: Advance to full liquid diet.   Desiree Hutchinson Shela Commons 08/18/2015

## 2015-08-19 LAB — GLUCOSE, CAPILLARY
Glucose-Capillary: 109 mg/dL — ABNORMAL HIGH (ref 65–99)
Glucose-Capillary: 111 mg/dL — ABNORMAL HIGH (ref 65–99)
Glucose-Capillary: 124 mg/dL — ABNORMAL HIGH (ref 65–99)

## 2015-08-19 MED ORDER — TRACE MINERALS CR-CU-MN-SE-ZN 10-1000-500-60 MCG/ML IV SOLN
INTRAVENOUS | Status: DC
  Filled 2015-08-19: qty 1440

## 2015-08-19 MED ORDER — FAT EMULSION 20 % IV EMUL
240.0000 mL | INTRAVENOUS | Status: DC
  Filled 2015-08-19: qty 250

## 2015-08-19 MED ORDER — TRACE MINERALS CR-CU-MN-SE-ZN 10-1000-500-60 MCG/ML IV SOLN
INTRAVENOUS | Status: AC
  Administered 2015-08-19: 18:00:00 via INTRAVENOUS
  Filled 2015-08-19: qty 1440

## 2015-08-19 MED ORDER — FAT EMULSION 20 % IV EMUL
240.0000 mL | INTRAVENOUS | Status: AC
  Administered 2015-08-19: 240 mL via INTRAVENOUS
  Filled 2015-08-19: qty 250

## 2015-08-19 NOTE — Progress Notes (Signed)
Patient ID: Desiree Hutchinson, female   DOB: 30-Aug-1920, 81 y.o.   MRN: [id number]     [address] [place], [place] [address]    Phone: [phone] FAX: [phone]     Subjective: Passing flatus. no n/v.  Tolerating fulls, but intake isnt adequate Foley replaced due to retention.  Afebrile. Did not sit up in chair or walk yesterday.   Objective:  Vital signs:  Filed Vitals:   08/18/15 1120 08/18/15 1507 08/18/15 2214 08/19/15 0410  BP:  153/73 154/75 158/64  Pulse:  94 89 91  Temp:  97.3 F (36.3 C) 98 F (36.7 C) 97.6 F (36.4 C)  TempSrc:  Oral Oral Oral  Resp:  22 24 24   Height:      Weight:      SpO2: 98% 100% 99% 100%    Last BM Date: 08/17/15 (per pt)  Intake/Output   Yesterday:  08/22 0701 - 08/23 0700 In: 1496.8 [I.V.:472; IV Piggyback:200; TPN:824.8] Out: 2700 [Urine:2700] This shift: I/O last 3 completed shifts: In: 1496.8 [I.V.:472; IV Piggyback:200] Out: 4700 [Urine:4700]    Physical Exam: General: Pt awake/alert/oriented x4 in no acute distress Abdomen: Soft.  Nondistended.   Mildly tender at incisions only. Midline incision with staples in place, wound edges are approximated, no erythema or drainage.  No evidence of peritonitis.  No incarcerated hernias.    Problem List:   Principal Problem:   SBO (small bowel obstruction) Active Problems:   Essential hypertension   Protein-calorie malnutrition   AKI (acute kidney injury)    Results:   Labs: Results for orders placed or performed during the hospital encounter of 08/05/15 (from the past 5 hour(s))  Glucose, capillary     Status: Abnormal   Collection Time: 08/17/15 11:51 AM  Result Value Ref Range   Glucose-Capillary 109 (H) 65 - 99 mg/dL   Comment 1 Notify RN    Comment 2 Document in Chart   Glucose, capillary     Status: Abnormal   Collection Time: 08/17/15  4:19 PM  Result Value Ref Range   Glucose-Capillary  104 (H) 65 - 99 mg/dL   Comment 1 Notify RN    Comment 2 Document in Chart   Glucose, capillary     Status: Abnormal   Collection Time: 08/17/15 11:36 PM  Result Value Ref Range   Glucose-Capillary 101 (H) 65 - 99 mg/dL  Comprehensive metabolic panel     Status: Abnormal   Collection Time: 08/18/15  5:31 AM  Result Value Ref Range   Sodium 139 135 - 145 mmol/L   Potassium 3.5 3.5 - 5.1 mmol/L   Chloride 108 101 - 111 mmol/L   CO2 26 22 - 32 mmol/L   Glucose, Bld 105 (H) 65 - 99 mg/dL   BUN 23 (H) 6 - 20 mg/dL   Creatinine, Ser 0.72 0.44 - 1.00 mg/dL   Calcium 8.2 (L) 8.9 - 10.3 mg/dL   Total Protein 4.6 (L) 6.5 - 8.1 g/dL   Albumin 2.4 (L) 3.5 - 5.0 g/dL   AST 24 15 - 41 U/L   ALT 18 14 - 54 U/L   Alkaline Phosphatase 49 38 - 126 U/L   Total Bilirubin 0.5 0.3 - 1.2 mg/dL   GFR calc non Af Amer >60 >60 mL/min   GFR calc Af Amer >60 >60 mL/min    Comment: (NOTE) The eGFR has been calculated using the CKD  EPI equation. This calculation has not been validated in all clinical situations. eGFR's persistently <60 mL/min signify possible Chronic Kidney Disease.    Anion gap 5 5 - 15  Magnesium     Status: None   Collection Time: 08/18/15  5:31 AM  Result Value Ref Range   Magnesium 2.1 1.7 - 2.4 mg/dL  Phosphorus     Status: None   Collection Time: 08/18/15  5:31 AM  Result Value Ref Range   Phosphorus 3.6 2.5 - 4.6 mg/dL  CBC     Status: Abnormal   Collection Time: 08/18/15  5:31 AM  Result Value Ref Range   WBC 5.7 4.0 - 10.5 K/uL   RBC 2.83 (L) 3.87 - 5.11 MIL/uL   Hemoglobin 8.8 (L) 12.0 - 15.0 g/dL   HCT 26.9 (L) 36.0 - 46.0 %   MCV 95.1 78.0 - 100.0 fL   MCH 31.1 26.0 - 34.0 pg   MCHC 32.7 30.0 - 36.0 g/dL   RDW 15.6 (H) 11.5 - 15.5 %   Platelets 203 150 - 400 K/uL  Differential     Status: None   Collection Time: 08/18/15  5:31 AM  Result Value Ref Range   Neutrophils Relative % 63 43 - 77 %   Neutro Abs 3.7 1.7 - 7.7 K/uL   Lymphocytes Relative 23 12 - 46 %    Lymphs Abs 1.3 0.7 - 4.0 K/uL   Monocytes Relative 10 3 - 12 %   Monocytes Absolute 0.6 0.1 - 1.0 K/uL   Eosinophils Relative 4 0 - 5 %   Eosinophils Absolute 0.2 0.0 - 0.7 K/uL   Basophils Relative 0 0 - 1 %   Basophils Absolute 0.0 0.0 - 0.1 K/uL  Triglycerides     Status: None   Collection Time: 08/18/15  5:31 AM  Result Value Ref Range   Triglycerides 68 <150 mg/dL    Comment: Performed at [facility]  Prealbumin     Status: Abnormal   Collection Time: 08/18/15  5:31 AM  Result Value Ref Range   Prealbumin 10.1 (L) 18 - 38 mg/dL    Comment: Performed at [facility]  Glucose, capillary     Status: None   Collection Time: 08/18/15  7:43 AM  Result Value Ref Range   Glucose-Capillary 98 65 - 99 mg/dL   Comment 1 Notify RN    Comment 2 Document in Chart   Urinalysis, Routine w reflex microscopic (not at [facility])     Status: None   Collection Time: 08/18/15 11:09 AM  Result Value Ref Range   Color, Urine YELLOW YELLOW   APPearance CLEAR CLEAR   Specific Gravity, Urine 1.010 1.005 - 1.030   pH 7.0 5.0 - 8.0   Glucose, UA NEGATIVE NEGATIVE mg/dL   Hgb urine dipstick NEGATIVE NEGATIVE   Bilirubin Urine NEGATIVE NEGATIVE   Ketones, ur NEGATIVE NEGATIVE mg/dL   Protein, ur NEGATIVE NEGATIVE mg/dL   Urobilinogen, UA 0.2 0.0 - 1.0 mg/dL   Nitrite NEGATIVE NEGATIVE   Leukocytes, UA NEGATIVE NEGATIVE    Comment: MICROSCOPIC NOT DONE ON URINES WITH NEGATIVE PROTEIN, BLOOD, LEUKOCYTES, NITRITE, OR GLUCOSE <1000 mg/dL.  Glucose, capillary     Status: Abnormal   Collection Time: 08/18/15  4:11 PM  Result Value Ref Range   Glucose-Capillary 115 (H) 65 - 99 mg/dL   Comment 1 Notify RN    Comment 2 Document in Chart   Glucose, capillary     Status: Abnormal  Collection Time: 08/19/15 12:32 AM  Result Value Ref Range   Glucose-Capillary 109 (H) 65 - 99 mg/dL    Imaging / Studies: No results found.  Medications / Allergies:  Scheduled Meds: . antiseptic oral  rinse  7 mL Mouth Rinse q12n4p  . chlorhexidine  15 mL Mouth Rinse BID  . feeding supplement  1 Container Oral TID BM  . heparin  5,000 Units Subcutaneous 3 times per day  . insulin aspart  0-9 Units Subcutaneous 3 times per day  . sodium chloride  10-40 mL Intracatheter Q12H  . tiotropium  1 capsule Inhalation Daily   Continuous Infusions: . Marland KitchenTPN (CLINIMIX-E) Adult 60 mL/hr at 08/18/15 1815   And  . fat emulsion 250 mL (08/18/15 1815)  . sodium chloride 40 mL/hr at 08/15/15 0053   PRN Meds:.acetaminophen, hydrALAZINE, HYDROcodone-acetaminophen, iohexol, morphine injection, ondansetron, sodium chloride  Antibiotics: Anti-infectives    Start     Dose/Rate Route Frequency Ordered Stop   08/10/15 0931  cefOXitin (MEFOXIN) 2 g in dextrose 5 % 50 mL IVPB     2 g 100 mL/hr over 30 Minutes Intravenous 30 min pre-op 08/10/15 3559 08/10/15 1039      Assessment/Plan SBO POD#9 exploratory laparotomy, LOA- 08/10/2015 --Dr. Excell Seltzer Post op ileus-resolving  -advance to a soft diet -leave staples in for 3-4 more days -encouraged to sit up in chair, walk with PT/nursing PCM-continue TPN, DC when PO intake is adequate. Urinary retention-per primary team VTE prophylaxis-SCD/heparin   Erby Pian, [facility] Surgery Pager [phone])   08/19/2015 7:32 AM

## 2015-08-19 NOTE — Progress Notes (Signed)
PARENTERAL NUTRITION CONSULT NOTE - Follow up  Pharmacy Consult for TPN Indication: SBO  No Known Allergies  Patient Measurements: Height:  (149.9 cm) Weight: 110 lb 3.7 oz (50 kg) IBW/kg (Calculated) : 43.2  Vital Signs: Temp: 97.6 F (36.4 C) (08/23 0410) Temp Source: Oral (08/23 0410) BP: 158/64 mmHg (08/23 0410) Pulse Rate: 91 (08/23 0410) Intake/Output from previous day: 08/22 0701 - 08/23 0700 In: 1496.8 [I.V.:472; IV Piggyback:200; TPN:824.8] Out: 2700 [Urine:2700] Intake/Output from this shift:    Labs:  Recent Labs  08/17/15 0525 08/18/15 0531  WBC 5.8 5.7  HGB 8.1* 8.8*  HCT 25.7* 26.9*  PLT 165 203    Recent Labs  08/17/15 0525 08/18/15 0531  NA 138 139  K 3.6 3.5  CL 109 108  CO2 26 26  GLUCOSE 101* 105*  BUN 25* 23*  CREATININE 0.74 0.72  CALCIUM 8.1* 8.2*  MG  --  2.1  PHOS  --  3.6  PROT  --  4.6*  ALBUMIN  --  2.4*  AST  --  24  ALT  --  18  ALKPHOS  --  49  BILITOT  --  0.5  PREALBUMIN  --  10.1*  TRIG  --  68   Estimated Creatinine Clearance: 30 mL/min (by C-G formula based on Cr of 0.72).    Recent Labs  08/18/15 0743 08/18/15 1611 08/19/15 0032  GLUCAP 98 115* 109*    Medical History: Past Medical History  Diagnosis Date  . History of atherosclerotic cardiovascular disease   . Hypertension   . Left ventricular hypertrophy   . Gastritis   . Cough   . Personal history of tobacco use, presenting hazards to health   . Obesity   . Chest pain, non-cardiac   . Allergic rhinitis   . COPD (chronic obstructive pulmonary disease)   . History of small bowel obstruction   . Chronic diastolic heart failure   . Aortic valve disease     regurge > stenosis   Medications:  Scheduled:  . antiseptic oral rinse  7 mL Mouth Rinse q12n4p  . chlorhexidine  15 mL Mouth Rinse BID  . feeding supplement  1 Container Oral TID BM  . heparin  5,000 Units Subcutaneous 3 times per day  . insulin aspart  0-9 Units Subcutaneous 3  times per day  . sodium chloride  10-40 mL Intracatheter Q12H  . tiotropium  1 capsule Inhalation Daily   Infusions:  . Marland KitchenTPN (CLINIMIX-E) Adult 60 mL/hr at 08/18/15 1815   And  . fat emulsion 250 mL (08/18/15 1815)  . sodium chloride 40 mL/hr at 08/15/15 0053   Insulin Requirements: no insulin in the past 24 hrs (on SSI q8h)  Current Nutrition:  TPN - BOOST TID started on 8/18 (pt taking some doses) - clear liquid diet started on 8/21  IVF: NS at 40 ml/hr  Central access:  8/14 TPN start date:  8/15  ASSESSMENT  HPI: 53 yoF admit 8/10 with abd pain, hx of diverticular disease, SBO. On 8/14 underwent Expl Lap & LOA for persistent SBO after 4 days conservative management. Begin TPN 8/15, Pharmacy to dose.  Significant events:  8/15: TPN started 8/18: Didn't tolerate full liquids that well (didn't like menu items), so going to regular diet. 8/19: experienced indigestion followed by emesis on regular diet; back to NPO. 8/22: starting clear liquid diet; Pt reported BM on 8/21 8/23: advancing to soft diet  Today:   Glucose: CBGs at goal < 150  Electrolytes: k 3.5 (on 8/22); CorrCa,  Phos and Mag wnl  Renal: wnl. CrCl 30 d/t advanced age and low weight.  LFTs: wnl  TGs: 87  Prealbumin: 10.1  NUTRITIONAL GOALS                                                                                             RD recs: 1250-1450 KCal, 65-75g protein, 1.5 L/day. Clinimix E 5/15 at a goal rate of 60 ml/hr + 20% fat emulsion at 10 ml/hr to provide: 72 g/day protein, 1500 Kcal/day.  PLAN                                                                                                                         At 1800 today:  Cont Clinimix E 5/15 at 60 ml/hr(goal rate).  20% fat emulsion at 10 ml/hr.  TPN to contain standard multivitamins and trace elements.  Cont IVF at 40  ml/hr.  continue Sensitive SSI to q8h.  TPN lab panels on Mondays & Thursdays.  F/u daily.  Dorna Leitz, PharmD, BCPS 08/19/2015 8:08 AM

## 2015-08-19 NOTE — Progress Notes (Signed)
[facility] PROGRESS NOTE  Desiree Hutchinson ZOX:[id number] DOB: [birth year] DOA: 08/05/2015 PCP: [name], MD  HPI: 81 year old patient with diverticular disease, HTN, LVH, COPD and aortic stenosis presented to the ED on 08/06/15 for diffusing, cramping abdominal pain that began on 08/05/15 after eating dinner. She denies nausea, vomiting, fever and chills. Pain fluctuates in intensity up to 8/10. She had a normal bowel movement on 08/05/15. Does note her bowel movements have been darker since taking iron supplements. She also complains of generalized weakness x 2 weeks. She has a history of 2 previous abdominal surgeries and does not have a history of colon cancer. She was recently hospitalized 7/23-7/27/16 for diverticulosis of the colon with hemorrhage and required a transfusion for anemia. Abdominal CT in the ED on 08/06/15 demonstrated findings consistent with a small bowel obstruction with no abscesses or perforation.  Patient was initially managed conservatively with NPO, NG tube and IVF, however failed to improve and she was taken to OR on 8/14. She was observed in SDU post op and transferred to floor on 8/15.  NG was successfully clamped on 8/16 and pt started on clears. She noted increasing abd pains with nausea/vomiting on 8/19 with abd xray demonstrating post-op ileus. The patient was briefly kept NPO. The patient was later noted to have bowel sounds and was passing flatus. She was restarted on clears with diet slowly being advanced. Patient will ultimately require SNF  Assessment/Plan: Small bowel obstruction - patient underwent ex lap with LOA on 8/14 per general surgery - NG tube was d/c'd post-op with diet being advanced - Post op abd xray has demonstrated post-op ileus. - Pt has noted improvement after cathartics and passing flatus - Surgery continues to follow - Pos BS on exam. Diet slowly being advanced to soft today  Post-Op Ileus - Per above. Noted on recent abd xray - Now  improving - Pos bs on exam. Cathartics ordered. Diet advancing  Hypertension - PRN IV hydralazine if SBP >180   Acute Kidney Injury - Likely due to dehydration.  - Cr remains stable, monitor renal panel  COPD - Currently remains stable, no wheezing  Protein-Calorie Malnutrition - TPN is continued - Slowly advancing diet  Recent GI bleed - Monitor with serial CBCs, no evidence of bleeding currently  ABLA - post op, monitor CBC  Code Status: Full Family Communication: Pt in room Disposition Plan: Pending  Consultants:  General Surgery  Procedures:  Ex lap with lysis of adhesion 8/14  Antibiotics:    HPI/Subjective: States feeling better today. Eager to eat  Objective: Filed Vitals:   08/18/15 1507 08/18/15 2214 08/19/15 0410 08/19/15 0758  BP: 153/73 154/75 158/64   Pulse: 94 89 91   Temp: 97.3 F (36.3 C) 98 F (36.7 C) 97.6 F (36.4 C)   TempSrc: Oral Oral Oral   Resp: Height:      Weight:      SpO2: 100% 99% 100% 94%    Intake/Output Summary (Last 24 hours) at 08/19/15 1346 Last data filed at 08/19/15 0412  Gross per 24 hour  Intake 1296.83 ml  Output   1650 ml  Net -353.17 ml   Filed Weights   08/11/15 1147 08/15/15 0447 08/16/15 0537  Weight: 49.3 kg (108 lb 11 oz) 50.1 kg (110 lb 7.2 oz) 50 kg (110 lb 3.7 oz)    Exam:   General:  Awake, laying in bed, in nad  Cardiovascular: regular, s1, s2  Respiratory: normal  resp effort, no wheezing  Abdomen: soft, pos bowel sounds, generally tender, less distended  Musculoskeletal: perfused, no clubbing   Data Reviewed: Basic Metabolic Panel:  Recent Labs Lab 08/14/15 0520 08/16/15 0545 08/17/15 0525 08/18/15 0531  NA 139  --  138 139  Hutchinson 3.6  --  3.6 3.5  CL 107  --  109 108  CO2 26  --  26 26  GLUCOSE 105*  --  101* 105*  BUN 21*  --  25* 23*  CREATININE 0.69  --  0.74 0.72  CALCIUM 8.5*  --  8.1* 8.2*  MG 2.0  --   --  2.1  PHOS 3.1 3.2  --  3.6   Liver  Function Tests:  Recent Labs Lab 08/14/15 0520 08/18/15 0531  AST 23 24  ALT 11* 18  ALKPHOS 42 49  BILITOT 0.4 0.5  PROT 5.0* 4.6*  ALBUMIN 2.6* 2.4*   No results for input(s): LIPASE, AMYLASE in the last 168 hours. No results for input(s): AMMONIA in the last 168 hours. CBC:  Recent Labs Lab 08/17/15 0525 08/18/15 0531  WBC 5.8 5.7  NEUTROABS  --  3.7  HGB 8.1* 8.8*  HCT 25.7* 26.9*  MCV 95.2 95.1  PLT 165 203   Cardiac Enzymes: No results for input(s): CKTOTAL, CKMB, CKMBINDEX, TROPONINI in the last 168 hours. BNP (last 3 results) No results for input(s): BNP in the last 8760 hours.  ProBNP (last 3 results) No results for input(s): PROBNP in the last 8760 hours.  CBG:  Recent Labs Lab 08/17/15 2336 08/18/15 0743 08/18/15 1611 08/19/15 0032 08/19/15 0738  GLUCAP 101* 98 115* 109* 111*    Recent Results (from the past 240 hour(s))  Surgical pcr screen     Status: None   Collection Time: 08/10/15  9:40 AM  Result Value Ref Range Status   MRSA, PCR NEGATIVE NEGATIVE Final   Staphylococcus aureus NEGATIVE NEGATIVE Final    Comment:        The Xpert SA Assay (FDA approved for NASAL specimens in patients over 54 years of age), is one component of a comprehensive surveillance program.  Test performance has been validated by [facility] for patients greater than or equal to 20 year old. It is not intended to diagnose infection nor to guide or monitor treatment.      Studies: No results found.  Scheduled Meds: . antiseptic oral rinse  7 mL Mouth Rinse q12n4p  . chlorhexidine  15 mL Mouth Rinse BID  . feeding supplement  1 Container Oral TID BM  . heparin  5,000 Units Subcutaneous 3 times per day  . insulin aspart  0-9 Units Subcutaneous 3 times per day  . sodium chloride  10-40 mL Intracatheter Q12H  . tiotropium  1 capsule Inhalation Daily   Continuous Infusions: . Marland KitchenTPN (CLINIMIX-E) Adult     And  . fat emulsion    . Marland KitchenTPN (CLINIMIX-E)  Adult     And  . fat emulsion    . sodium chloride 40 mL/hr at 08/15/15 0053    Principal Problem:   SBO (small bowel obstruction) Active Problems:   Essential hypertension   Protein-calorie malnutrition   AKI (acute kidney injury)   Desiree Hutchinson  [facility] Pager [phone]. If 7PM-7AM, please contact night-coverage at [email or web address], password [facility] 08/19/2015, 1:46 PM  LOS: 13 days

## 2015-08-19 NOTE — Progress Notes (Signed)
Nutrition Follow-up  DOCUMENTATION CODES:   Severe malnutrition in context of chronic illness  INTERVENTION:  - Continue TPN per pharmacy - Recommend Panda with Osmolite 1.2 @ 50 mL/hr to provide 1440 kcal, 67 grams protein, and 984 mL free water - Continue Boost Breeze TID - RD will continue to monitor for needs  NUTRITION DIAGNOSIS:   Malnutrition related to chronic illness as evidenced by severe depletion of body fat, severe depletion of muscle mass. -ongoing  GOAL:   Patient will meet greater than or equal to 90% of their needs -met with TPN  MONITOR:   Weight trends, Labs, I & O's, Other (Comment) (TPN regimen)  REASON FOR ASSESSMENT:   Consult New TPN/TNA  ASSESSMENT:   81 y.o. female has a past medical history significant for diverticular disease, HTN, LVH, COPD and aortic stenosis who presented to the ED for diffuse, cramping abdominal pain that began last night after dinner.   8/23 Pt continues with Clinimix 5/15 @ 60 mL/hr with 20% lipids @ 10 mL/hr which is providing 72 grams protein, 1502 kcal which is meeting needs. Per chart review, diet advancement as follows: 8/19: NPO 8/21 @ 7341: CLD 8/22 @ 9379: FLD 8/23 @ 0815: Soft  Per chart review, pt consumed 70% of FLD for dinner last night. RN reports meal refusal today, pt also refusing food and drink at time of RD visit. Pt states that she has abdominal pain with intake of food that is not otherwise present. She denies this pain with intake of liquids.  TPN is not an appropriate long-term nutrition support option. If gut functionality can be shown through intakes of CLD/FLD, recommend placement of Panda and initiation of TF and wean of TPN.  Meeting needs with TPN alone. Medications reviewed. Labs reviewed; CBGs: 90-115 mg/dL, BUN elevated, Ca: 8.2 mg/dL.  8/19 - Pt continues with Clinimix 5/15 @ 60 mL/hr with 20% lipids @ 10 mL/hr which is providing 72 grams protein, 1502 kcal which is meeting needs. - Per  chart review, pt ate 5% of dinner last night. She had episode of emesis at 0009 today.  - Will monitor for ability to tolerate oral/enteral route of nutrition. If gut is function, it would be preferential to transition from TPN to TF if long-term nutrition support will be needed and if within [place].  8/18 - Pt continues with Clinimix 5/15 @ 60 mL/hr with 20% lipids @ 10 mL/hr which is providing 72 grams protein, 1502 kcal which is meeting needs. - Per discussion with pharmacist, pt not taking much PO on liquid diets; diet advanced to Regular in order to give her more options and to assess gut function and tolerance. - Pt's family member reports pt does not eat large portions, on average, and that she began steadily losing weight 2.5 years ago and has been unable to gain weight since that time.  - Family member states she purchased Ensure which pt would consume variably.  - Pt had Boost Breeze on previous admissions and liked this.  - Will monitor PO intakes and tolerance. Should gut function be suitable for PO nutrition, will monitor GOC as TPN would not be a suitable long-term solution for inadequate intakes.  8/17 - Pt now POD #3 ex lap with lysis of adhesions. NGT was removed yesterday (8/16) afternoon.  - Pt currently receiving TPN at goal rate: Clinimix E 5/15 @ 60 mL/hr with 20% lipids @ 10 mL/hr which is providing 72 grams protein, 1502 kcal which is meeting needs.  8/15 - New consult received for new TPN. Pt POD 1 ex lap with lysis of adhesions.  - Pt transferred from ICU today.  - She has been NPO since admission and unable to meet needs. Needs calculated during initial assessment remain appropriate although higher ranges of kcal and protein should be achieved, if possible due to recent surgery.  - Current order in place for Clinimix E 5/15 @ 40 mL/hr with 20% lipids @ 10 mL/hr which provides 48 grams protein, 1162 kcal which does not meet needs.  8/11 - Per pt, she drinks Ensure  at home, one a day. Encouraged her to drink increase to 2-3 daily. - Per weight history, pt has lost 12 lb since 10/02/14 (12% weight loss x 10 months, insignificant for time frame).  Diet Order:  DIET SOFT Room service appropriate?: Yes; Fluid consistency:: Thin .TPN (CLINIMIX-E) Adult .TPN (CLINIMIX-E) Adult  Skin:  Reviewed, no issues  Last BM:  8/21  Height:   Ht Readings from Last 1 Encounters:  08/11/15 4' 11"  (1.499 m)    Weight:   Wt Readings from Last 1 Encounters:  08/16/15 110 lb 3.7 oz (50 kg)    Ideal Body Weight:  44.1 kg  BMI:  Body mass index is 22.25 kg/(m^2).  Estimated Nutritional Needs:   Kcal:  1250-1450  Protein:  65-75g  Fluid:  1.5L/day  EDUCATION NEEDS:   No education needs identified at this time      Jarome Matin, RD, LDN Inpatient Clinical Dietitian Pager # [phone] After hours/weekend pager # [phone]

## 2015-08-20 DIAGNOSIS — K5669 Other intestinal obstruction: Secondary | ICD-10-CM

## 2015-08-20 DIAGNOSIS — E43 Unspecified severe protein-calorie malnutrition: Secondary | ICD-10-CM

## 2015-08-20 DIAGNOSIS — I10 Essential (primary) hypertension: Secondary | ICD-10-CM

## 2015-08-20 DIAGNOSIS — I5032 Chronic diastolic (congestive) heart failure: Secondary | ICD-10-CM | POA: Diagnosis present

## 2015-08-20 LAB — BRAIN NATRIURETIC PEPTIDE: B Natriuretic Peptide: 68.4 pg/mL (ref 0.0–100.0)

## 2015-08-20 LAB — GLUCOSE, CAPILLARY
Glucose-Capillary: 106 mg/dL — ABNORMAL HIGH (ref 65–99)
Glucose-Capillary: 112 mg/dL — ABNORMAL HIGH (ref 65–99)
Glucose-Capillary: 85 mg/dL (ref 65–99)
Glucose-Capillary: 90 mg/dL (ref 65–99)

## 2015-08-20 MED ORDER — BISACODYL 10 MG RE SUPP
10.0000 mg | Freq: Once | RECTAL | Status: AC
  Administered 2015-08-20: 10 mg via RECTAL
  Filled 2015-08-20: qty 1

## 2015-08-20 MED ORDER — FUROSEMIDE 10 MG/ML IJ SOLN
20.0000 mg | Freq: Every day | INTRAMUSCULAR | Status: DC
  Administered 2015-08-21 – 2015-08-22 (×2): 20 mg via INTRAVENOUS
  Filled 2015-08-20 (×3): qty 2

## 2015-08-20 NOTE — Progress Notes (Signed)
Patient ID: Desiree Hutchinson, female   DOB: [birth year], 81 y.o.   MRN: [id number]     [facility]      [address]   [place], [place] [address]    Phone: [phone] FAX: [phone]     Subjective: Didn't walk.  Perseverates on foley being in. Passing flatus.  Appetite is okay. Afebrile.   Objective:  Vital signs:  Filed Vitals:   08/19/15 0758 08/19/15 1418 08/19/15 2102 08/20/15 0506  BP:  160/84 168/68 143/57  Pulse:  98 95 98  Temp:  98.4 F (36.9 C) 97.6 F (36.4 C) 97.7 F (36.5 C)  TempSrc:  Oral Oral Oral  Resp:  Height:      Weight:      SpO2: 94% 98% 100% 100%    Last BM Date: 08/17/15 (per pt)  Intake/Output   Yesterday:  08/23 0701 - 08/24 0700 In: -  Out: 2700 [Urine:2700] This shift:     Physical Exam: General: Pt awake/alert/oriented x4 in no acute distress Abdomen: Soft. Nondistended. Mildly tender at incisions only. Midline incision with staples in place, wound edges are approximated, no erythema or drainage. No evidence of peritonitis. No incarcerated hernias.  Problem List:   Principal Problem:   SBO (small bowel obstruction) Active Problems:   Essential hypertension   Protein-calorie malnutrition   AKI (acute kidney injury)    Results:   Labs: Results for orders placed or performed during the hospital encounter of 08/05/15 (from the past 48 hour(s))  Urinalysis, Routine w reflex microscopic (not at [facility])     Status: None   Collection Time: 08/18/15 11:09 AM  Result Value Ref Range   Color, Urine YELLOW YELLOW   APPearance CLEAR CLEAR   Specific Gravity, Urine 1.010 1.005 - 1.030   pH 7.0 5.0 - 8.0   Glucose, UA NEGATIVE NEGATIVE mg/dL   Hgb urine dipstick NEGATIVE NEGATIVE   Bilirubin Urine NEGATIVE NEGATIVE   Ketones, ur NEGATIVE NEGATIVE mg/dL   Protein, ur NEGATIVE NEGATIVE mg/dL   Urobilinogen, UA 0.2 0.0 - 1.0 mg/dL   Nitrite NEGATIVE NEGATIVE   Leukocytes,  UA NEGATIVE NEGATIVE    Comment: MICROSCOPIC NOT DONE ON URINES WITH NEGATIVE PROTEIN, BLOOD, LEUKOCYTES, NITRITE, OR GLUCOSE <1000 mg/dL.  Glucose, capillary     Status: Abnormal   Collection Time: 08/18/15  4:11 PM  Result Value Ref Range   Glucose-Capillary 115 (H) 65 - 99 mg/dL   Comment 1 Notify RN    Comment 2 Document in Chart   Glucose, capillary     Status: Abnormal   Collection Time: 08/19/15 12:32 AM  Result Value Ref Range   Glucose-Capillary 109 (H) 65 - 99 mg/dL  Glucose, capillary     Status: Abnormal   Collection Time: 08/19/15  7:38 AM  Result Value Ref Range   Glucose-Capillary 111 (H) 65 - 99 mg/dL   Comment 1 Notify RN    Comment 2 Document in Chart   Glucose, capillary     Status: Abnormal   Collection Time: 08/19/15  4:02 PM  Result Value Ref Range   Glucose-Capillary 124 (H) 65 - 99 mg/dL   Comment 1 Notify RN    Comment 2 Document in Chart   Glucose, capillary     Status: Abnormal   Collection Time: 08/19/15 11:54 PM  Result Value Ref Range   Glucose-Capillary 112 (H) 65 - 99 mg/dL  Glucose, capillary  Status: Abnormal   Collection Time: 08/20/15  7:53 AM  Result Value Ref Range   Glucose-Capillary 106 (H) 65 - 99 mg/dL    Imaging / Studies: No results found.  Medications / Allergies:  Scheduled Meds: . antiseptic oral rinse  7 mL Mouth Rinse q12n4p  . chlorhexidine  15 mL Mouth Rinse BID  . feeding supplement  1 Container Oral TID BM  . heparin  5,000 Units Subcutaneous 3 times per day  . insulin aspart  0-9 Units Subcutaneous 3 times per day  . sodium chloride  10-40 mL Intracatheter Q12H  . tiotropium  1 capsule Inhalation Daily   Continuous Infusions: . sodium chloride 40 mL/hr at 08/15/15 0053   PRN Meds:.acetaminophen, hydrALAZINE, HYDROcodone-acetaminophen, iohexol, morphine injection, ondansetron, sodium chloride  Antibiotics: Anti-infectives    Start     Dose/Rate Route Frequency Ordered Stop   08/10/15 0931  cefOXitin  (MEFOXIN) 2 g in dextrose 5 % 50 mL IVPB     2 g 100 mL/hr over 30 Minutes Intravenous 30 min pre-op 08/10/15 0929 08/10/15 1039       Assessment/Plan SBO POD#10 exploratory laparotomy, LOA- 08/10/2015 --Dr. Johna Sheriff Post op ileus-resolving  -soft diet -leave staples in for 2-3 more days -encouraged to sit up in chair, walk with PT/nursing -IS(pulling ) -give dulcolax suppository  PCM-DC TPN Urinary retention-would favor removing, per primary team VTE prophylaxis-SCD/heparin   Ashok Norris, [facility] Pager [phone]([id number]:30P)   08/20/2015 8:34 AM

## 2015-08-20 NOTE — Progress Notes (Signed)
Pt able to sleep throughout the night. Arouses easily.  Pain med given as ordered. No skin issues noted at this time. SRP, RN

## 2015-08-20 NOTE — Progress Notes (Signed)
Physical Therapy Treatment Patient Details Name: LYNDON CHENOWETH MRN: [id number] DOB: 1920-06-25 Today's Date: 08/20/2015    History of Present Illness 81 yo female adm with SBO; PMHx:HTN; s/p exp lap with lysis of adhesions for SBO 08/10/15    PT Comments    Min assist for bed mobility. Assisted pt onto bedpan. Attempted to get pt OOB but pt declined. Continue to recommend SNF  Follow Up Recommendations  SNF;Supervision/Assistance - 24 hour     Equipment Recommendations  Rolling walker with 5" wheels    Recommendations for Other Services OT consult     Precautions / Restrictions Precautions Precautions: Fall Precaution Comments: abd surgery    Mobility  Bed Mobility Overal bed mobility: Needs Assistance             General bed mobility comments: Min assist for bed mobility. Assisted pt onto bedpan. Attempted to get pt up to [facility] but pt declined.  Transfers                    Ambulation/Gait                 Stairs            Wheelchair Mobility    Modified Rankin (Stroke Patients Only)       Balance                                    Cognition Arousal/Alertness: Awake/alert Behavior During Therapy: WFL for tasks assessed/performed Overall Cognitive Status: Within Functional Limits for tasks assessed                      Exercises      General Comments        Pertinent Vitals/Pain Pain Assessment: Faces Faces Pain Scale: Hurts even more Pain Location: abdomen Pain Descriptors / Indicators: Sore;Grimacing;Guarding Pain Intervention(s): Monitored during session;Limited activity within patient's tolerance    Home Living                      Prior Function            PT Goals (current goals can now be found in the care plan section)      Frequency  Min 3X/week    PT Plan Current plan remains appropriate    Co-evaluation             End of Session   Activity Tolerance:  Patient limited by fatigue;Patient limited by pain Patient left: in bed;with call bell/phone within reach;with bed alarm set     Time: 1531-1553 PT Time Calculation (min) (ACUTE ONLY): 22 min  Charges:  $Therapeutic Activity: 8-22 mins                    G Codes:      Rebeca Alert, MPT Pager: [phone]

## 2015-08-20 NOTE — Progress Notes (Signed)
CSW following for discharge to [facility] when medically ready. CSW has completed FL2 & will continue to follow and assist with discharge when ready.    Lincoln Maxin, LCSW [facility] Clinical Social Worker cell #: [phone]

## 2015-08-20 NOTE — Progress Notes (Addendum)
PROGRESS NOTE  BRIGHTEN BUZZELLI ZOX:[id number] DOB: 1920/01/09 DOA: 08/05/2015 PCP: [name], MD  HPI/Recap of past 51 hours: 81 year old patient past history diverticulosis, COPD and aortic stenosis admitted on 8/10 for 1 day history of diffuse cramping abdominal pain with CT noting small bowel obstruction. Surgery consulted initially managed conservatively  Assessment/Plan: Principal Problem:   SBO (small bowel obstruction): Starting to pass flatus. TPN discontinued. Advancing diet. Surgery following. Active Problems:   Chronic diastolic heart failure/Essential hypertension: Pressures trending upward. I suspect she is volume overloaded. Have started Lasix    Severe Protein-calorie malnutrition: Nutrition following. Patient meets criteria in the context of chronic illness. On boost breeze 3 times a day. Hopefully will improve now that she is taking by mouth   AKI (acute kidney injury) resolved. Secondary dehydration from nausea and vomiting from bowel obstruction:    Code Status: Full code  Family Communication: Message left w/family  Disposition Plan: Anticipate return to skilled nursing in 48 hours   Consultants:  General Surgery  Procedures:  Ex lap with lysis of adhesion 8/14  Antibiotics:  None   Objective: BP 150/68 mmHg  Pulse 100  Temp(Src) 98.7 F (37.1 C) (Oral)  Resp 20  Ht 4\' 11"  (1.499 m)  Wt 50 kg (110 lb 3.7 oz)  BMI 22.25 kg/m2  SpO2 98%  Intake/Output Summary (Last 24 hours) at 08/20/15 1507 Last data filed at 08/20/15 1400  Gross per 24 hour  Intake    350 ml  Output   4025 ml  Net  -3675 ml   Filed Weights   08/11/15 1147 08/15/15 0447 08/16/15 0537  Weight: 49.3 kg (108 lb 11 oz) 50.1 kg (110 lb 7.2 oz) 50 kg (110 lb 3.7 oz)    Exam:   General:  Alert oriented 2, fatigued  Cardiovascular: Regular rate and rhythm, S1-S2  Respiratory: Clear to auscultation bilaterally  Abdomen: Soft, generalized nonspecific minimal  tenderness, hypoactive bowel sounds  Musculoskeletal: No edema   Data Reviewed: Basic Metabolic Panel:  Recent Labs Lab 08/14/15 0520 08/16/15 0545 08/17/15 0525 08/18/15 0531  NA 139  --  138 139  K 3.6  --  3.6 3.5  CL 107  --  109 108  CO2 26  --  26 26  GLUCOSE 105*  --  101* 105*  BUN 21*  --  25* 23*  CREATININE 0.69  --  0.74 0.72  CALCIUM 8.5*  --  8.1* 8.2*  MG 2.0  --   --  2.1  PHOS 3.1 3.2  --  3.6   Liver Function Tests:  Recent Labs Lab 08/14/15 0520 08/18/15 0531  AST 23 24  ALT 11* 18  ALKPHOS 42 49  BILITOT 0.4 0.5  PROT 5.0* 4.6*  ALBUMIN 2.6* 2.4*   No results for input(s): LIPASE, AMYLASE in the last 168 hours. No results for input(s): AMMONIA in the last 168 hours. CBC:  Recent Labs Lab 08/17/15 0525 08/18/15 0531  WBC 5.8 5.7  NEUTROABS  --  3.7  HGB 8.1* 8.8*  HCT 25.7* 26.9*  MCV 95.2 95.1  PLT 165 203   Cardiac Enzymes:   No results for input(s): CKTOTAL, CKMB, CKMBINDEX, TROPONINI in the last 168 hours. BNP (last 3 results) No results for input(s): BNP in the last 8760 hours.  ProBNP (last 3 results) No results for input(s): PROBNP in the last 8760 hours.  CBG:  Recent Labs Lab 08/19/15 8119 08/19/15 1602 08/19/15 2354 08/20/15 0753 08/20/15 1208  GLUCAP 111* 124* 112* 106* 90    No results found for this or any previous visit (from the past 240 hour(s)).   Studies: No results found.  Scheduled Meds: . antiseptic oral rinse  7 mL Mouth Rinse q12n4p  . chlorhexidine  15 mL Mouth Rinse BID  . feeding supplement  1 Container Oral TID BM  . heparin  5,000 Units Subcutaneous 3 times per day  . insulin aspart  0-9 Units Subcutaneous 3 times per day  . sodium chloride  10-40 mL Intracatheter Q12H  . tiotropium  1 capsule Inhalation Daily    Continuous Infusions:    Time spent: 25 minutes  Hollice Espy  [facility] Pager [phone]. If 7PM-7AM, please contact night-coverage at [email or web address],  password [facility] 08/20/2015, 3:07 PM  LOS: 14 days

## 2015-08-21 DIAGNOSIS — E46 Unspecified protein-calorie malnutrition: Secondary | ICD-10-CM

## 2015-08-21 DIAGNOSIS — N179 Acute kidney failure, unspecified: Secondary | ICD-10-CM

## 2015-08-21 LAB — GLUCOSE, CAPILLARY
Glucose-Capillary: 95 mg/dL (ref 65–99)
Glucose-Capillary: 84 mg/dL (ref 65–99)
Glucose-Capillary: 94 mg/dL (ref 65–99)
Glucose-Capillary: 100 mg/dL — ABNORMAL HIGH (ref 65–99)
Glucose-Capillary: 86 mg/dL (ref 65–99)

## 2015-08-21 LAB — BASIC METABOLIC PANEL
Anion gap: 7 (ref 5–15)
BUN: 24 mg/dL — ABNORMAL HIGH (ref 6–20)
CO2: 26 mmol/L (ref 22–32)
Calcium: 8.8 mg/dL — ABNORMAL LOW (ref 8.9–10.3)
Chloride: 103 mmol/L (ref 101–111)
Creatinine, Ser: 0.85 mg/dL (ref 0.44–1.00)
GFR calc Af Amer: >60 mL/min (ref >60)
GFR calc non Af Amer: 57 mL/min — ABNORMAL LOW (ref >60)
Glucose, Bld: 96 mg/dL (ref 65–99)
Potassium: 4.6 mmol/L (ref 3.5–5.1)
Sodium: 136 mmol/L (ref 135–145)

## 2015-08-21 NOTE — Progress Notes (Signed)
11 Days Post-Op  Subjective: Eating some.  Objective: Vital signs in last 24 hours: Temp:  [97.9 F (36.6 C)-98.7 F (37.1 C)] 98.2 F (36.8 C) (08/25 0531) Pulse Rate:  [92-102] 100 (08/25 0531) Resp:  [15-20] 18 (08/25 0531) BP: (150-155)/(68-73) 155/70 mmHg (08/25 0531) SpO2:  [96 %-98 %] 97 % (08/25 0531) Weight:  [46.9 kg (103 lb 6.3 oz)] 46.9 kg (103 lb 6.3 oz) (08/25 0531) Last BM Date: 08/20/15  Intake/Output from previous day: 08/24 0701 - 08/25 0700 In: 360 [P.O.:340; I.V.:20] Out: 1425 [Urine:1425] Intake/Output this shift:    PE: General- In NAD Abdomen-mild distension, incision clean and intact, active bowel sounds  Lab Results:  No results for input(s): WBC, HGB, HCT, PLT in the last 72 hours. BMET  Recent Labs  08/21/15 0430  NA 136  K 4.6  CL 103  CO2 26  GLUCOSE 96  BUN 24*  CREATININE 0.85  CALCIUM 8.8*   PT/INR No results for input(s): LABPROT, INR in the last 72 hours. Comprehensive Metabolic Panel:    Component Value Date/Time   NA 136 08/21/2015 0430   NA 139 08/18/2015 0531   K 4.6 08/21/2015 0430   K 3.5 08/18/2015 0531   CL 103 08/21/2015 0430   CL 108 08/18/2015 0531   CO2 26 08/21/2015 0430   CO2 26 08/18/2015 0531   BUN 24* 08/21/2015 0430   BUN 23* 08/18/2015 0531   CREATININE 0.85 08/21/2015 0430   CREATININE 0.72 08/18/2015 0531   GLUCOSE 96 08/21/2015 0430   GLUCOSE 105* 08/18/2015 0531   GLUCOSE 94 10/27/2006 1030   CALCIUM 8.8* 08/21/2015 0430   CALCIUM 8.2* 08/18/2015 0531   AST 24 08/18/2015 0531   AST 23 08/14/2015 0520   ALT 18 08/18/2015 0531   ALT 11* 08/14/2015 0520   ALKPHOS 49 08/18/2015 0531   ALKPHOS 42 08/14/2015 0520   BILITOT 0.5 08/18/2015 0531   BILITOT 0.4 08/14/2015 0520   PROT 4.6* 08/18/2015 0531   PROT 5.0* 08/14/2015 0520   ALBUMIN 2.4* 08/18/2015 0531   ALBUMIN 2.6* 08/14/2015 0520     Studies/Results: No results found.  Anti-infectives: Anti-infectives    Start     Dose/Rate  Route Frequency Ordered Stop   08/10/15 0931  cefOXitin (MEFOXIN) 2 g in dextrose 5 % 50 mL IVPB     2 g 100 mL/hr over 30 Minutes Intravenous 30 min pre-op 08/10/15 1610 08/10/15 1039      Assessment SBO s/p exploratory laparotomy, LOA- 08/10/2015 --Dr. Johna Sheriff Post op ileus-resolved; on soft diet and bowels moving PCM-on soft diet Urinary retention- per primary team VTE prophylaxis-SCD/heparin  LOS: 15 days   Plan: Remove staples tomorrow.  SNF   Kasem Mozer J 08/21/2015

## 2015-08-21 NOTE — Progress Notes (Signed)
PROGRESS NOTE  LAKE BREEDING JXB:[id number] DOB: 1920-07-23 DOA: 08/05/2015 PCP: [name], MD  HPI/Recap of past 68 hours: 81 year old patient past history diverticulosis, COPD and aortic stenosis admitted on 8/10 for 1 day history of diffuse cramping abdominal pain with CT noting small bowel obstruction. Surgery consulted initially managed conservatively, but after several days, patient no better and was taken to the operating room for lysis of adhesions. Postop only complicated by ileus which has since resolved. Patient briefly on TPN and now currently restarted on by mouth and starting to tolerate  Patient feeling better today. Minimal abdominal pain. Tolerating by mouth  Assessment/Plan: Principal Problem:   SBO (small bowel obstruction): Starting to pass flatus. Starting to eat. Surgery following. Active Problems:   Chronic diastolic heart failure/Essential hypertension: Pressures trending upward. I suspect she is volume overloaded. Have started Lasix and she diuresed some    Severe Protein-calorie malnutrition: Nutrition following. Patient meets criteria in the context of chronic illness. On boost breeze 3 times a day. Hopefully will improve now that she is taking by mouth   AKI (acute kidney injury) resolved. Secondary dehydration from nausea and vomiting from bowel obstruction:    Code Status: Full code  Family Communication: Spoke with daughter at bedside  Disposition Plan: Anticipate return to skilled nursing tomorrow   Consultants:  General Surgery  Procedures:  Ex lap with lysis of adhesion 8/14  Antibiotics:  None   Objective: BP 141/74 mmHg  Pulse 100  Temp(Src) 98.2 F (36.8 C) (Oral)  Resp 20  Ht 4\' 11"  (1.499 m)  Wt 46.9 kg (103 lb 6.3 oz)  BMI 20.87 kg/m2  SpO2 99%  Intake/Output Summary (Last 24 hours) at 08/21/15 1626 Last data filed at 08/21/15 0308  Gross per 24 hour  Intake     10 ml  Output    100 ml  Net    -90 ml   Filed  Weights   08/15/15 0447 08/16/15 0537 08/21/15 0531  Weight: 50.1 kg (110 lb 7.2 oz) 50 kg (110 lb 3.7 oz) 46.9 kg (103 lb 6.3 oz)    Exam:   General:  Alert oriented 2, fatigued  Cardiovascular: Regular rate and rhythm, S1-S2  Respiratory: Clear to auscultation bilaterally  Abdomen: Soft, nontender, nondistended, hypoactive bowel sounds-increased  Musculoskeletal: No edema   Data Reviewed: Basic Metabolic Panel:  Recent Labs Lab 08/16/15 0545 08/17/15 0525 08/18/15 0531 08/21/15 0430  NA  --  138 139 136  K  --  3.6 3.5 4.6  CL  --  109 108 103  CO2  --  26 26 26   GLUCOSE  --  101* 105* 96  BUN  --  25* 23* 24*  CREATININE  --  0.74 0.72 0.85  CALCIUM  --  8.1* 8.2* 8.8*  MG  --   --  2.1  --   PHOS 3.2  --  3.6  --    Liver Function Tests:  Recent Labs Lab 08/18/15 0531  AST 24  ALT 18  ALKPHOS 49  BILITOT 0.5  PROT 4.6*  ALBUMIN 2.4*   No results for input(s): LIPASE, AMYLASE in the last 168 hours. No results for input(s): AMMONIA in the last 168 hours. CBC:  Recent Labs Lab 08/17/15 0525 08/18/15 0531  WBC 5.8 5.7  NEUTROABS  --  3.7  HGB 8.1* 8.8*  HCT 25.7* 26.9*  MCV 95.2 95.1  PLT 165 203   Cardiac Enzymes:   No results for input(s): CKTOTAL,  CKMB, CKMBINDEX, TROPONINI in the last 168 hours. BNP (last 3 results)  Recent Labs  08/20/15 1810  BNP 68.4    ProBNP (last 3 results) No results for input(s): PROBNP in the last 8760 hours.  CBG:  Recent Labs Lab 08/20/15 1208 08/20/15 1635 08/21/15 0102 08/21/15 0523 08/21/15 0745  GLUCAP 90 85 95 84 94    No results found for this or any previous visit (from the past 240 hour(s)).   Studies: No results found.  Scheduled Meds: . antiseptic oral rinse  7 mL Mouth Rinse q12n4p  . chlorhexidine  15 mL Mouth Rinse BID  . feeding supplement  1 Container Oral TID BM  . furosemide  20 mg Intravenous Daily  . heparin  5,000 Units Subcutaneous 3 times per day  . insulin  aspart  0-9 Units Subcutaneous 3 times per day  . sodium chloride  10-40 mL Intracatheter Q12H  . tiotropium  1 capsule Inhalation Daily    Continuous Infusions:    Time spent: 15 minutes  Hollice Espy  [facility] Pager [phone]. If 7PM-7AM, please contact night-coverage at [email or web address], password [facility] 08/21/2015, 4:26 PM  LOS: 15 days

## 2015-08-21 NOTE — Progress Notes (Signed)
Nutrition Follow-up  DOCUMENTATION CODES:   Severe malnutrition in context of chronic illness  INTERVENTION:  - Continue Soft diet and Boost Breeze TID - RD will continue to monitor for needs  NUTRITION DIAGNOSIS:   Malnutrition related to chronic illness as evidenced by severe depletion of body fat, severe depletion of muscle mass. -ongoing  GOAL:   Patient will meet greater than or equal to 90% of their needs -unmet since d/c of TPN  MONITOR:   PO intake, Weight trends, Labs, I & O's  ASSESSMENT:   81 y.o. female has a past medical history significant for diverticular disease, HTN, LVH, COPD and aortic stenosis who presented to the ED for diffuse, cramping abdominal pain that began last night after dinner.   8/25 TPN was d/c'ed yesterday afternoon and pt continues on Soft diet. Per chart review, pt consumed 25% of breakfast and refused lunch yesterday. Pt sleeping at time of visit with no visitors present. Notes indicate that when pt ready for d/c she will go to [facility]; this facility has Ensure Clear which would be appropriate substitution for Boost Breeze.  Visualized breakfast tray with 2-3 bites of toast taken and the rest of the tray untouched; a few sips of Boost Breeze taken but unsure when this carton was opened.  Not meeting needs. Medications reviewed. Labs reviewed; CBGs: 84-124 mg/dL, BUN elevated.    8/23 - Pt continues with Clinimix 5/15 @ 60 mL/hr with 20% lipids @ 10 mL/hr which is providing 72 grams protein, 1502 kcal which is meeting needs. - Per chart review, pt consumed 70% of FLD for dinner last night.  - RN reports meal refusal today, pt also refusing food and drink at time of RD visit. - Pt states that she has abdominal pain with intake of food that is not otherwise present. She denies this pain with intake of liquids. - TPN is not an appropriate long-term nutrition support option. If gut functionality can be shown through intakes of  CLD/FLD, recommend placement of Panda and initiation of TF and wean of TPN.  8/19 - Pt continues with Clinimix 5/15 @ 60 mL/hr with 20% lipids @ 10 mL/hr which is providing 72 grams protein, 1502 kcal which is meeting needs. - Per chart review, pt ate 5% of dinner last night. She had episode of emesis at 0009 today.  - Will monitor for ability to tolerate oral/enteral route of nutrition. If gut is function, it would be preferential to transition from TPN to TF if long-term nutrition support will be needed and if within GOC.  8/18 - Pt continues with Clinimix 5/15 @ 60 mL/hr with 20% lipids @ 10 mL/hr which is providing 72 grams protein, 1502 kcal which is meeting needs. - Per discussion with pharmacist, pt not taking much PO on liquid diets; diet advanced to Regular in order to give her more options and to assess gut function and tolerance. - Pt's family member reports pt does not eat large portions, on average, and that she began steadily losing weight 2.5 years ago and has been unable to gain weight since that time.  - Family member states she purchased Ensure which pt would consume variably.  - Pt had Boost Breeze on previous admissions and liked this.  - Will monitor PO intakes and tolerance. Should gut function be suitable for PO nutrition, will monitor GOC as TPN would not be a suitable long-term solution for inadequate intakes.  8/17 - Pt now POD #3 ex lap with  lysis of adhesions. NGT was removed yesterday (8/16) afternoon.  - Pt currently receiving TPN at goal rate: Clinimix E 5/15 @ 60 mL/hr with 20% lipids @ 10 mL/hr which is providing 72 grams protein, 1502 kcal which is meeting needs.  8/15 - New consult received for new TPN. Pt POD 1 ex lap with lysis of adhesions.  - Pt transferred from ICU today.  - She has been NPO since admission and unable to meet needs. Needs calculated during initial assessment remain appropriate although higher ranges of kcal and protein should be  achieved, if possible due to recent surgery.  - Current order in place for Clinimix E 5/15 @ 40 mL/hr with 20% lipids @ 10 mL/hr which provides 48 grams protein, 1162 kcal which does not meet needs.  8/11 - Per pt, she drinks Ensure at home, one a day. Encouraged her to drink increase to 2-3 daily. - Per weight history, pt has lost 12 lb since 10/02/14 (12% weight loss x 10 months, insignificant for time frame).   Diet Order:  DIET SOFT Room service appropriate?: Yes; Fluid consistency:: Thin  Skin:  Reviewed, no issues  Last BM:  8/24  Height:   Ht Readings from Last 1 Encounters:  08/11/15 4\' 11"  (1.499 m)    Weight:   Wt Readings from Last 1 Encounters:  08/21/15 103 lb 6.3 oz (46.9 kg)    Ideal Body Weight:  44.1 kg  BMI:  Body mass index is 20.87 kg/(m^2).  Estimated Nutritional Needs:   Kcal:  1250-1450  Protein:  65-75g  Fluid:  1.5L/day  EDUCATION NEEDS:   No education needs identified at this time     Trenton Gammon, RD, LDN Inpatient Clinical Dietitian Pager # [phone] After hours/weekend pager # [phone]

## 2015-08-21 NOTE — Progress Notes (Signed)
Assumed care of patient at this time. Pt is stable and has no complaints. Agree with this am's assessment. Will continue to monitor pt.   Arta Bruce [facility] 08/21/2015 3:22 PM

## 2015-08-21 NOTE — Care Management Note (Signed)
Case Management Note  Patient Details  Name: TIFFANEY HEIMANN MRN: [id number] Date of Birth: [birth year]  Subjective/Objective: POD#11 exp lap, loa.advancing diet.                   Action/Plan:d/c SNF in am.   Expected Discharge Date:   (UNKNOWN)               Expected Discharge Plan:  Skilled Nursing Facility  In-House Referral:  NA  Discharge planning Services  CM Consult  Post Acute Care Choice:  NA Choice offered to:  Patient  DME Arranged:  N/A DME Agency:  NA  HH Arranged:    HH Agency:  NA  Status of Service:  In process, will continue to follow  Medicare Important Message Given:  Yes-second notification given Date Medicare IM Given:    Medicare IM give by:    Date Additional Medicare IM Given:    Additional Medicare Important Message give by:     If discussed at Long Length of Stay Meetings, dates discussed:    Additional Comments:  Lanier Clam, RN 08/21/2015, 12:14 PM

## 2015-08-22 DIAGNOSIS — R1084 Generalized abdominal pain: Secondary | ICD-10-CM | POA: Diagnosis not present

## 2015-08-22 DIAGNOSIS — J449 Chronic obstructive pulmonary disease, unspecified: Secondary | ICD-10-CM | POA: Diagnosis not present

## 2015-08-22 DIAGNOSIS — I5030 Unspecified diastolic (congestive) heart failure: Secondary | ICD-10-CM | POA: Diagnosis not present

## 2015-08-22 DIAGNOSIS — N179 Acute kidney failure, unspecified: Secondary | ICD-10-CM | POA: Diagnosis not present

## 2015-08-22 DIAGNOSIS — I10 Essential (primary) hypertension: Secondary | ICD-10-CM | POA: Diagnosis not present

## 2015-08-22 DIAGNOSIS — I2510 Atherosclerotic heart disease of native coronary artery without angina pectoris: Secondary | ICD-10-CM | POA: Diagnosis not present

## 2015-08-22 DIAGNOSIS — N289 Disorder of kidney and ureter, unspecified: Secondary | ICD-10-CM | POA: Diagnosis not present

## 2015-08-22 DIAGNOSIS — R2681 Unsteadiness on feet: Secondary | ICD-10-CM | POA: Diagnosis not present

## 2015-08-22 DIAGNOSIS — R1013 Epigastric pain: Secondary | ICD-10-CM | POA: Diagnosis not present

## 2015-08-22 DIAGNOSIS — H9193 Unspecified hearing loss, bilateral: Secondary | ICD-10-CM | POA: Diagnosis not present

## 2015-08-22 DIAGNOSIS — M6281 Muscle weakness (generalized): Secondary | ICD-10-CM | POA: Diagnosis not present

## 2015-08-22 DIAGNOSIS — E43 Unspecified severe protein-calorie malnutrition: Secondary | ICD-10-CM | POA: Diagnosis not present

## 2015-08-22 DIAGNOSIS — I5032 Chronic diastolic (congestive) heart failure: Secondary | ICD-10-CM | POA: Diagnosis not present

## 2015-08-22 DIAGNOSIS — R141 Gas pain: Secondary | ICD-10-CM | POA: Diagnosis not present

## 2015-08-22 DIAGNOSIS — D62 Acute posthemorrhagic anemia: Secondary | ICD-10-CM | POA: Diagnosis not present

## 2015-08-22 DIAGNOSIS — S37009S Unspecified injury of unspecified kidney, sequela: Secondary | ICD-10-CM | POA: Diagnosis not present

## 2015-08-22 DIAGNOSIS — R5381 Other malaise: Secondary | ICD-10-CM | POA: Diagnosis not present

## 2015-08-22 DIAGNOSIS — H6123 Impacted cerumen, bilateral: Secondary | ICD-10-CM | POA: Diagnosis not present

## 2015-08-22 DIAGNOSIS — K5669 Other intestinal obstruction: Secondary | ICD-10-CM | POA: Diagnosis not present

## 2015-08-22 LAB — GLUCOSE, CAPILLARY
Glucose-Capillary: 79 mg/dL (ref 65–99)
Glucose-Capillary: 92 mg/dL (ref 65–99)

## 2015-08-22 MED ORDER — ACETAMINOPHEN 325 MG PO TABS: 325.0000 mg | ORAL_TABLET | Freq: Four times a day (QID) | ORAL | Status: DC | PRN

## 2015-08-22 MED ORDER — CARVEDILOL 3.125 MG PO TABS
3.1250 mg | ORAL_TABLET | Freq: Two times a day (BID) | ORAL | Status: DC
  Administered 2015-08-22: 3.125 mg via ORAL
  Filled 2015-08-22: qty 1

## 2015-08-22 MED ORDER — HYDROCODONE-ACETAMINOPHEN 5-325 MG PO TABS: 1.0000 | ORAL_TABLET | ORAL | Status: DC | PRN

## 2015-08-22 MED ORDER — CARVEDILOL 3.125 MG PO TABS: 3.1250 mg | ORAL_TABLET | Freq: Two times a day (BID) | ORAL | Status: DC

## 2015-08-22 MED ORDER — LISINOPRIL 2.5 MG PO TABS: 2.5000 mg | ORAL_TABLET | Freq: Every day | ORAL | Status: DC

## 2015-08-22 MED ORDER — LISINOPRIL 2.5 MG PO TABS
2.5000 mg | ORAL_TABLET | Freq: Every day | ORAL | Status: DC
  Administered 2015-08-22: 2.5 mg via ORAL
  Filled 2015-08-22: qty 1

## 2015-08-22 NOTE — Clinical Social Work Placement (Signed)
Patient is set to discharge to [facility] today. Patient & grandaughter, [name] aware. Discharge packet given to RN, Cicero Duck. [facility] will be called for transport after PT works with patient, per grandaughter's request.     Lincoln Maxin, LCSW [facility] Clinical Social Worker cell #: [phone]    CLINICAL SOCIAL WORK PLACEMENT  NOTE  Date:  08/22/2015  Patient Details  Name: BRYNA RAZAVI MRN: [id number] Date of Birth: 03-25-1920  Clinical Social Work is seeking post-discharge placement for this patient at the Skilled  Nursing Facility level of care (*CSW will initial, date and re-position this form in  chart as items are completed):  Yes   Patient/family provided with [facility] Clinical Social Work Department's list of facilities offering this level of care within the geographic area requested by the patient (or if unable, by the patient's family).  Yes   Patient/family informed of their freedom to choose among providers that offer the needed level of care, that participate in Medicare, Medicaid or managed care program needed by the patient, have an available bed and are willing to accept the patient.  Yes   Patient/family informed of [facility]'s ownership interest in [facility] and [facility] as well as of the fact that they are under no obligation to receive care at these facilities.  PASRR submitted to EDS on 08/14/15     PASRR number received on 08/14/15     Existing PASRR number confirmed on       FL2 transmitted to all facilities in geographic area requested by pt/family on 08/14/15     FL2 transmitted to all facilities within larger geographic area on       Patient informed that his/her managed care company has contracts with or will negotiate with certain facilities, including the following:        Yes   Patient/family informed of bed offers received.  Patient chooses bed at [facility]     Physician  recommends and patient chooses bed at      Patient to be transferred to [facility] on 08/22/15.  Patient to be transferred to facility by [facility]     Patient family notified on 08/22/15 of transfer.  Name of family member notified:  patient's grandaughter, [name] via phone     PHYSICIAN       Additional Comment:    _______________________________________________ Arlyss Repress, LCSW 08/22/2015, 10:30 AM

## 2015-08-22 NOTE — Progress Notes (Signed)
Physical Therapy Treatment Patient Details Name: Desiree Hutchinson MRN: [id number] DOB: 1920-06-26 Today's Date: 08/22/2015    History of Present Illness 81 yo female adm with SBO; PMHx:HTN; s/p exp lap with lysis of adhesions for SBO 08/10/15    PT Comments    Progressing with mobility. Continue to recommend snf.   Follow Up Recommendations  SNF     Equipment Recommendations  Rolling walker with 5" wheels    Recommendations for Other Services       Precautions / Restrictions Precautions Precautions: Fall Precaution Comments: abd surgery Restrictions Weight Bearing Restrictions: No    Mobility  Bed Mobility               General bed mobility comments: pt oob in recliner  Transfers Overall transfer level: Needs assistance Equipment used: Rolling walker (2 wheeled) Transfers: Sit to/from Stand Sit to Stand: Min assist         General transfer comment: assist to rise,stabilize, control descent. vcs hand placement  Ambulation/Gait Ambulation/Gait assistance: Min assist;+2 safety/equipment Ambulation Distance (Feet): 60 Feet Assistive device: Rolling walker (2 wheeled) Gait Pattern/deviations: Step-through pattern;Decreased stride length;Trunk flexed     General Gait Details: assist to stabilize pt and maneuver with walker. slow gait speed. dyspnea 2-3/4   Stairs            Wheelchair Mobility    Modified Rankin (Stroke Patients Only)       Balance                                    Cognition Arousal/Alertness: Awake/alert Behavior During Therapy: WFL for tasks assessed/performed Overall Cognitive Status: Within Functional Limits for tasks assessed                      Exercises      General Comments        Pertinent Vitals/Pain Pain Assessment: Faces Faces Pain Scale: Hurts little more Pain Location: abdomen Pain Descriptors / Indicators: Sore;Grimacing Pain Intervention(s): Monitored during session     Home Living                      Prior Function            PT Goals (current goals can now be found in the care plan section) Progress towards PT goals: Progressing toward goals    Frequency  Min 3X/week    PT Plan Current plan remains appropriate    Co-evaluation             End of Session   Activity Tolerance: Patient limited by fatigue;Patient limited by pain Patient left: in chair;with call bell/phone within reach;with chair alarm set     Time: 1150-1206 PT Time Calculation (min) (ACUTE ONLY): 16 min  Charges:  $Gait Training: 8-22 mins                    G Codes:      Rebeca Alert, MPT Pager: [phone]

## 2015-08-22 NOTE — Progress Notes (Signed)
12 Days Post-Op  Subjective: Eating some of her soft diet.  Reports having a BM yesterday.  Objective: Vital signs in last 24 hours: Temp:  [97.4 F (36.3 C)-98.2 F (36.8 C)] 98 F (36.7 C) (08/26 0556) Pulse Rate:  [97-104] 97 (08/26 0556) Resp:  [18-20] 18 (08/26 0556) BP: (141-158)/(55-74) 157/55 mmHg (08/26 0556) SpO2:  [98 %-100 %] 100 % (08/26 0556) Weight:  [46 kg (101 lb 6.6 oz)] 46 kg (101 lb 6.6 oz) (08/26 0556) Last BM Date: 08/20/15  Intake/Output from previous day: 08/25 0701 - 08/26 0700 In: 240 [P.O.:240] Out: 550 [Urine:550] Intake/Output this shift:    PE: General- In NAD Abdomen-mild distension, incision clean and intact  Lab Results:  No results for input(s): WBC, HGB, HCT, PLT in the last 72 hours. BMET  Recent Labs  08/21/15 0430  NA 136  K 4.6  CL 103  CO2 26  GLUCOSE 96  BUN 24*  CREATININE 0.85  CALCIUM 8.8*   PT/INR No results for input(s): LABPROT, INR in the last 72 hours. Comprehensive Metabolic Panel:    Component Value Date/Time   NA 136 08/21/2015 0430   NA 139 08/18/2015 0531   K 4.6 08/21/2015 0430   K 3.5 08/18/2015 0531   CL 103 08/21/2015 0430   CL 108 08/18/2015 0531   CO2 26 08/21/2015 0430   CO2 26 08/18/2015 0531   BUN 24* 08/21/2015 0430   BUN 23* 08/18/2015 0531   CREATININE 0.85 08/21/2015 0430   CREATININE 0.72 08/18/2015 0531   GLUCOSE 96 08/21/2015 0430   GLUCOSE 105* 08/18/2015 0531   GLUCOSE 94 10/27/2006 1030   CALCIUM 8.8* 08/21/2015 0430   CALCIUM 8.2* 08/18/2015 0531   AST 24 08/18/2015 0531   AST 23 08/14/2015 0520   ALT 18 08/18/2015 0531   ALT 11* 08/14/2015 0520   ALKPHOS 49 08/18/2015 0531   ALKPHOS 42 08/14/2015 0520   BILITOT 0.5 08/18/2015 0531   BILITOT 0.4 08/14/2015 0520   PROT 4.6* 08/18/2015 0531   PROT 5.0* 08/14/2015 0520   ALBUMIN 2.4* 08/18/2015 0531   ALBUMIN 2.6* 08/14/2015 0520     Studies/Results: No results found.  Anti-infectives: Anti-infectives    Start      Dose/Rate Route Frequency Ordered Stop   08/10/15 0931  cefOXitin (MEFOXIN) 2 g in dextrose 5 % 50 mL IVPB     2 g 100 mL/hr over 30 Minutes Intravenous 30 min pre-op 08/10/15 1610 08/10/15 1039      Assessment SBO s/p exploratory laparotomy, LOA- 08/10/2015 --Dr. Johna Sheriff:  Progressing slowly Urinary retention- per primary team   LOS: 16 days   Plan: Remove staples.  Okay to go to SNF from my standpoint.   Desiree Hutchinson Shela Commons 08/22/2015

## 2015-08-22 NOTE — Care Management Note (Signed)
Case Management Note  Patient Details  Name: Desiree Hutchinson MRN: [id number] Date of Birth: 1920-01-16  Subjective/Objective:                    Action/Plan:d/c SNF.   Expected Discharge Date:   (UNKNOWN)               Expected Discharge Plan:  Skilled Nursing Facility  In-House Referral:  NA  Discharge planning Services  CM Consult  Post Acute Care Choice:  NA Choice offered to:  Patient  DME Arranged:  N/A DME Agency:  NA  HH Arranged:    HH Agency:  NA  Status of Service:  Completed, signed off  Medicare Important Message Given:  Yes-second notification given Date Medicare IM Given:    Medicare IM give by:    Date Additional Medicare IM Given:    Additional Medicare Important Message give by:     If discussed at Long Length of Stay Meetings, dates discussed:    Additional Comments:  Lanier Clam, RN 08/22/2015, 12:38 PM

## 2015-08-22 NOTE — Discharge Summary (Signed)
Discharge Summary  Desiree Hutchinson ZOX:[id number] DOB: 1920/12/15  PCP: [name], MD  Admit date: 08/05/2015 Discharge date: 08/22/2015  Time spent: 25 minutes  Recommendations for Outpatient Follow-up:  1. New medication: Coreg 3.125 mg by mouth twice a day 2. New medication: Lisinopril 2.5 mg by mouth daily 3. New Medication: Vicodin 1 tab by mouth every 6 hours when necessary for pain  4. Patient will follow-up with general surgery, Dr. Johna Sheriff in 3-4 weeks  Discharge Diagnoses:  Active Hospital Problems   Diagnosis Date Noted  . SBO (small bowel obstruction) 08/06/2015  . Chronic diastolic heart failure 08/20/2015  . AKI (acute kidney injury) 08/06/2015  . Protein-calorie malnutrition 07/21/2015  . Essential hypertension 10/14/2007    Resolved Hospital Problems   Diagnosis Date Noted Date Resolved  No resolved problems to display.    Discharge Condition: Improved, being discharged to skilled nursing  Diet recommendation: Low-sodium diet  Filed Weights   08/16/15 0537 08/21/15 0531 08/22/15 0556  Weight: 50 kg (110 lb 3.7 oz) 46.9 kg (103 lb 6.3 oz) 46 kg (101 lb 6.6 oz)    History of present illness:  81 year old patient past history diverticulosis, COPD and aortic stenosis admitted on 8/10 for 1 day history of diffuse cramping abdominal pain with CT noting small bowel obstruction.  Hospital Course:  Principal Problem:   SBO (small bowel obstruction): Surgery consulted initially managed conservatively, but after several days, patient no better and was taken to the operating room for lysis of adhesions. Postop only complicated by ileus which has since resolved.  Patient did require TPN briefly, however once ileus resolved, TPN discontinued and patient started on diet which she is currently tolerating. Staples removed 8/26 and cleared for skilled nursing Active Problems:   Essential hypertension: Patient previously not on home medications. See below.    Severe  Protein-calorie malnutrition: Patient meets criteria in the context of chronic illness. Seen by nutrition. She previously had been on boost breeze 3 times a day which was recommended to be continued even after discharge    AKI (acute kidney injury): Secondary to dehydration from initial nausea and vomiting from bowel obstruction. Resolved.   Acute on Chronic diastolic heart failure: Echocardiogram from 12/15 notes grade 1 diastolic dysfunction. Noting elevated blood pressure & weights elevated.  Started on lasix & she diuresed well.  Started on beta blocker and ACE inhibitor at low doses both for blood pressure as well as core measures for heart failure   Procedures:  Status post exploratory laparotomy with lysis of of adhesions on 8/14  Consultations:  General surgery  Discharge Exam: BP 157/55 mmHg  Pulse 97  Temp(Src) 98 F (36.7 C) (Oral)  Resp 18  Ht 4\' 11"  (1.499 m)  Wt 46 kg (101 lb 6.6 oz)  BMI 20.47 kg/m2  SpO2 100%  General: Alert and oriented 3, no acute distress Cardiovascular: Regular rate and rhythm, S1-S2 Respiratory: Clear to auscultation bilaterally  Discharge Instructions You were cared for by a hospitalist during your hospital stay. If you have any questions about your discharge medications or the care you received while you were in the hospital after you are discharged, you can call the unit and asked to speak with the hospitalist on call if the hospitalist that took care of you is not available. Once you are discharged, your primary care physician will handle any further medical issues. Please note that NO REFILLS for any discharge medications will be authorized once you are discharged, as it is  imperative that you return to your primary care physician (or establish a relationship with a primary care physician if you do not have one) for your aftercare needs so that they can reassess your need for medications and monitor your lab values.  Discharge Instructions     Diet - low sodium heart healthy    Complete by:  As directed      Increase activity slowly    Complete by:  As directed             Medication List    TAKE these medications        acetaminophen 325 MG tablet  Commonly known as:  TYLENOL  Take 1-2 tablets (325-650 mg total) by mouth every 6 (six) hours as needed for fever, headache, mild pain or moderate pain.     carvedilol 3.125 MG tablet  Commonly known as:  COREG  Take 1 tablet (3.125 mg total) by mouth 2 (two) times daily with a meal.     feeding supplement Liqd  Take 1 Container by mouth 3 (three) times daily between meals.     ferrous sulfate 325 (65 FE) MG EC tablet  Take 1 tablet (325 mg total) by mouth daily with breakfast.     HYDROcodone-acetaminophen 5-325 MG per tablet  Commonly known as:  NORCO/VICODIN  Take 1 tablet by mouth every 4 (four) hours as needed for moderate pain or severe pain.     lisinopril 2.5 MG tablet  Commonly known as:  PRINIVIL,ZESTRIL  Take 1 tablet (2.5 mg total) by mouth daily.     polyethylene glycol powder powder  Commonly known as:  GLYCOLAX/MIRALAX  Take 17 g by mouth daily.     sodium chloride 0.65 % Soln nasal spray  Commonly known as:  OCEAN  Place 1 spray into both nostrils as needed for congestion.     Tiotropium Bromide Monohydrate 2.5 MCG/ACT Aers  Commonly known as:  SPIRIVA RESPIMAT  Inhale 2 puffs into the lungs daily.       No Known Allergies     Follow-up Information    Follow up with [name], MD.   Specialty:  Endocrinology   Contact information:   [address] [place] [address] [phone]        The results of significant diagnostics from this hospitalization (including imaging, microbiology, ancillary and laboratory) are listed below for reference.    Significant Diagnostic Studies: Dg Abd 1 View  08/09/2015   CLINICAL DATA:  Small bowel obstruction, abdominal distension  EXAM: ABDOMEN - 1 VIEW  COMPARISON:   08/07/2015  FINDINGS: Upper abdomen is excluded from the field of view. Numerous gas-filled dilated loops of small bowel which appears similar to the prior exam. There is no bowel dilatation to suggest obstruction. There is no evidence of pneumoperitoneum, portal venous gas or pneumatosis. There are no pathologic calcifications along the expected course of the ureters.The osseous structures are unremarkable.  IMPRESSION: Persistent numerous gas-filled dilated loops of small bowel similar to the prior exam consistent with small-bowel obstruction.   Electronically Signed   By: Elige Ko   On: 08/09/2015 09:08   Dg Abd 1 View  08/06/2015   CLINICAL DATA:  NG tube placement.SBO. Abdominal pain.Hx appendectomy. Hx hernia repair.  EXAM: ABDOMEN - 1 VIEW  COMPARISON:  08/06/2015 at 11:15 a.m.  FINDINGS: There is air-filled, mildly dilated bowel throughout the abdomen similar to the prior study. Nasogastric tube curls within the stomach which is  partly decompressed.  IMPRESSION: Persistent mildly dilated air-filled bowel consistent with a partial small bowel obstruction.  Well-positioned nasogastric tube curling within the stomach.   Electronically Signed   By: Amie Portland M.D.   On: 08/06/2015 13:21   Dg Abd 1 View  08/06/2015   CLINICAL DATA:  NG placement  EXAM: ABDOMEN - 1 VIEW  COMPARISON:  CT abdomen 08/06/2015  FINDINGS: NG tube coiled in the body of the stomach. Mild ileus. Negative for bowel obstruction.  IMPRESSION: NG tube in the body the stomach.  Mild ileus.   Electronically Signed   By: Marlan Palau M.D.   On: 08/06/2015 11:26   Ct Abdomen Pelvis W Contrast  08/06/2015   CLINICAL DATA:  Generalized abdominal pain  EXAM: CT ABDOMEN AND PELVIS WITH CONTRAST  TECHNIQUE: Multidetector CT imaging of the abdomen and pelvis was performed using the standard protocol following bolus administration of intravenous contrast.  CONTRAST:  50mL OMNIPAQUE IOHEXOL 300 MG/ML SOLN, 80mL OMNIPAQUE IOHEXOL 300  MG/ML SOLN  COMPARISON:  07/20/2015  FINDINGS: Lower chest: No pleural effusion identified. There are coarsened interstitial markings identified within both lung bases. Calcified granuloma identified within the right middle lobe. There is also calcified right hilar node.  Hepatobiliary: No suspicious liver abnormality identified. Stable cyst within the dome of liver measure 7 mm, image 12/series 2. The gallbladder appears normal. No biliary dilatation.  Pancreas: Normal appearance of the pancreas.  Spleen: The spleen is unremarkable.  Adrenals/Urinary Tract: Normal appearance of the adrenal glands. Bilateral renal cortical volume loss identified. Cyst within the mid left kidney measures 1.2 cm, image 15/series 7. There is moderate distension of the urinary bladder.  Stomach/Bowel: Moderate distension of the stomach. Small bowel loops are increased in caliber and there are multiple fluid levels. The small bowel loops measure up to 4.2 cm, image 52/series 2. Transition to decreased caliber distal small bowel loops identified in the right abdomen, image 25/series 3. No evidence for perforation or abscess. Normal caliber large bowel loops. Numerous colonic diverticula noted without acute inflammation.  Vascular/Lymphatic: Aortic atherosclerosis identified. The infrarenal abdominal aorta measures 2.8 cm, image 33/series 2.  Reproductive: Partially calcified an atrophic uterus noted.  Other: Small amount of perihepatic ascites noted.  Musculoskeletal: The bones are appear diffusely osteopenic. Multi level degenerative disc disease identified.  IMPRESSION: 1. Findings consistent with small bowel obstruction. Transition to relative normal caliber distal small bowel loops noted in the right abdomen. 2. Aortic atherosclerosis. 3. Prior granulomatous disease. 4. Ectatic abdominal aorta. 5.   Electronically Signed   By: Signa Kell M.D.   On: 08/06/2015 09:42   Dg Chest Port 1 View  08/06/2015   CLINICAL DATA:  Evaluate  nasogastric tube placement.  EXAM: PORTABLE CHEST - 1 VIEW  COMPARISON:  08/06/2015 at 0531 hr  FINDINGS: Nasogastric tube has been placed. The side hole of the tube is in the gastric body region. The tip of the tube is not visualized but likely in the gastric body. There appears to be gaseous distension of the stomach. There is concern for new densities at the left lung base. Heart size is normal. Atherosclerotic calcifications involving the aortic arch.  IMPRESSION: Nasogastric tube in the stomach.  New densities at the left lung base. Findings could represent atelectasis based on the recent development. Recommend follow-up to exclude an area of infection or aspiration.   Electronically Signed   By: Richarda Overlie M.D.   On: 08/06/2015 11:26   Dg Abd 2  Views  08/10/2015   CLINICAL DATA:  Abdominal distension  EXAM: ABDOMEN - 2 VIEW  COMPARISON:  08/09/2015  FINDINGS: Persistent significant gaseous distended small bowel loops consistent with small bowel obstruction. NG tube coiled within stomach with tip in proximal stomach. No free abdominal air.  IMPRESSION: Persistent gaseous distended small bowel loops consistent with small bowel obstruction.   Electronically Signed   By: Natasha Mead M.D.   On: 08/10/2015 10:44   Dg Abd 2 Views  08/07/2015   CLINICAL DATA:  Abdominal distention and nausea  EXAM: ABDOMEN - 2 VIEW  COMPARISON:  08/06/2015  FINDINGS: A nasogastric catheter is again noted coiled within the stomach. Scattered large and small bowel gas is noted. Mild persistent dilatation of the small bowel is seen stable from the previous exam. Small amount of colonic air is noted consistent with a partial small bowel obstruction. No free air is seen. No abnormal mass lesion is noted. Contrast material from prior CT is noted within the colon.  IMPRESSION: Persistent small bowel dilatation consistent with a partial small bowel obstruction. No significant change from the previous day is noted.   Electronically Signed    By: Alcide Clever M.D.   On: 08/07/2015 11:08   Dg Abd Acute W/chest  08/06/2015   CLINICAL DATA:  Acute onset of generalized abdominal pain. Initial encounter.  EXAM: DG ABDOMEN ACUTE W/ 1V CHEST  COMPARISON:  CT of the abdomen and pelvis from 07/20/2015, and chest radiograph performed 12/26/2013  FINDINGS: The lungs are well-aerated. Mild bibasilar atelectasis is noted. Mild vascular congestion is seen. There is no evidence of pleural effusion or pneumothorax. The cardiomediastinal silhouette is within normal limits.  The visualized bowel gas pattern is unremarkable. Scattered stool, fluid and air are seen within the colon; there is no evidence of small bowel dilatation to suggest obstruction. No free intra-abdominal air is identified on the provided upright view.  No acute osseous abnormalities are seen; the sacroiliac joints are unremarkable in appearance.  IMPRESSION: 1. Unremarkable bowel gas pattern; no free intra-abdominal air seen. Small amount of stool and fluid noted in the colon. 2. Mild bibasilar atelectasis noted.  Mild vascular congestion seen.   Electronically Signed   By: Roanna Raider M.D.   On: 08/06/2015 06:10   Dg Abd Portable 1v  08/15/2015   CLINICAL DATA:  Abdominal pain. Nausea and vomiting. Initial encounter.  EXAM: PORTABLE ABDOMEN - 1 VIEW  COMPARISON:  08/10/2015.  FINDINGS: Gaseous distension of large and small bowel is present. Midline abdominal staples are present compatible with laparotomy. No gross plain film evidence of free air. There may be a tiny amount of gas in the region of the rectosigmoid. Gaseous distension is diffuse.  IMPRESSION: Diffuse gaseous distension of bowel, most compatible with postoperative ileus.   Electronically Signed   By: Andreas Newport M.D.   On: 08/15/2015 13:45    Microbiology: No results found for this or any previous visit (from the past 240 hour(s)).   Labs: Basic Metabolic Panel:  Recent Labs Lab 08/16/15 0545 08/17/15 0525  08/18/15 0531 08/21/15 0430  NA  --  138 139 136  K  --  3.6 3.5 4.6  CL  --  109 108 103  CO2  --  26 26 26   GLUCOSE  --  101* 105* 96  BUN  --  25* 23* 24*  CREATININE  --  0.74 0.72 0.85  CALCIUM  --  8.1* 8.2* 8.8*  MG  --   --  2.1  --   PHOS 3.2  --  3.6  --    Liver Function Tests:  Recent Labs Lab 08/18/15 0531  AST 24  ALT 18  ALKPHOS 49  BILITOT 0.5  PROT 4.6*  ALBUMIN 2.4*   No results for input(s): LIPASE, AMYLASE in the last 168 hours. No results for input(s): AMMONIA in the last 168 hours. CBC:  Recent Labs Lab 08/17/15 0525 08/18/15 0531  WBC 5.8 5.7  NEUTROABS  --  3.7  HGB 8.1* 8.8*  HCT 25.7* 26.9*  MCV 95.2 95.1  PLT 165 203   Cardiac Enzymes: No results for input(s): CKTOTAL, CKMB, CKMBINDEX, TROPONINI in the last 168 hours. BNP: BNP (last 3 results)  Recent Labs  08/20/15 1810  BNP 68.4    ProBNP (last 3 results) No results for input(s): PROBNP in the last 8760 hours.  CBG:  Recent Labs Lab 08/21/15 0745 08/21/15 1641 08/21/15 2100 08/22/15 0040 08/22/15 0740  GLUCAP 94 100* 86 92 79       Signed:  Jurnee Nakayama K  [facility] 08/22/2015, 10:02 AM

## 2015-08-22 NOTE — Progress Notes (Signed)
Report given to Hans Eden at [facility].

## 2015-08-22 NOTE — Care Management Important Message (Signed)
Important Message  Patient Details  Name: Desiree Hutchinson MRN: [id number] Date of Birth: 05/23/1920   Medicare Important Message Given:  Yes-third notification given    Haskell Flirt 08/22/2015, 1:23 PMImportant Message  Patient Details  Name: Desiree Hutchinson MRN: [id number] Date of Birth: Nov 11, 1920   Medicare Important Message Given:  Yes-third notification given    Haskell Flirt 08/22/2015, 1:22 PM

## 2015-08-22 NOTE — Progress Notes (Signed)
[facility] called for transport.     Tatsuo Musial, LCSW [facility] Clinical Social Worker cell #: [phone]  

## 2015-08-22 NOTE — Progress Notes (Signed)
Attempted to call report to [facility]. Will try again.

## 2015-08-25 DIAGNOSIS — R141 Gas pain: Secondary | ICD-10-CM | POA: Diagnosis not present

## 2015-08-25 DIAGNOSIS — R1013 Epigastric pain: Secondary | ICD-10-CM | POA: Diagnosis not present

## 2015-08-28 DIAGNOSIS — I10 Essential (primary) hypertension: Secondary | ICD-10-CM | POA: Diagnosis not present

## 2015-08-28 DIAGNOSIS — J449 Chronic obstructive pulmonary disease, unspecified: Secondary | ICD-10-CM | POA: Diagnosis not present

## 2015-08-28 DIAGNOSIS — R5381 Other malaise: Secondary | ICD-10-CM | POA: Diagnosis not present

## 2015-08-28 DIAGNOSIS — I5030 Unspecified diastolic (congestive) heart failure: Secondary | ICD-10-CM | POA: Diagnosis not present

## 2015-09-11 ENCOUNTER — Ambulatory Visit: Payer: Medicare Other | Admitting: Endocrinology

## 2015-09-12 ENCOUNTER — Encounter: Payer: Self-pay | Admitting: Endocrinology

## 2015-09-12 ENCOUNTER — Ambulatory Visit (INDEPENDENT_AMBULATORY_CARE_PROVIDER_SITE_OTHER): Payer: Medicare Other | Admitting: Endocrinology

## 2015-09-12 ENCOUNTER — Other Ambulatory Visit (INDEPENDENT_AMBULATORY_CARE_PROVIDER_SITE_OTHER): Payer: Medicare Other

## 2015-09-12 ENCOUNTER — Other Ambulatory Visit: Payer: Self-pay

## 2015-09-12 VITALS — BP 112/60 | HR 77 | Temp 97.9°F | Ht 61.0 in | Wt 91.0 lb

## 2015-09-12 DIAGNOSIS — D62 Acute posthemorrhagic anemia: Secondary | ICD-10-CM

## 2015-09-12 DIAGNOSIS — I2510 Atherosclerotic heart disease of native coronary artery without angina pectoris: Secondary | ICD-10-CM | POA: Diagnosis not present

## 2015-09-12 DIAGNOSIS — N289 Disorder of kidney and ureter, unspecified: Secondary | ICD-10-CM

## 2015-09-12 LAB — BASIC METABOLIC PANEL
BUN: 19 mg/dL (ref 6–23)
CO2: 27 mEq/L (ref 19–32)
Calcium: 9.6 mg/dL (ref 8.4–10.5)
Chloride: 104 mEq/L (ref 96–112)
Creatinine, Ser: 0.88 mg/dL (ref 0.40–1.20)
GFR: 77.02 mL/min (ref >60.00)
Glucose, Bld: 118 mg/dL — ABNORMAL HIGH (ref 70–99)
Potassium: 3.8 mEq/L (ref 3.5–5.1)
Sodium: 138 mEq/L (ref 135–145)

## 2015-09-12 LAB — CBC WITH DIFFERENTIAL/PLATELET
Basophils Absolute: 0 10*3/uL (ref 0.0–0.1)
Basophils Relative: 0.6 % (ref 0.0–3.0)
Eosinophils Absolute: 0.1 10*3/uL (ref 0.0–0.7)
Eosinophils Relative: 2.1 % (ref 0.0–5.0)
HCT: 32.2 % — ABNORMAL LOW (ref 36.0–46.0)
Hemoglobin: 10.8 g/dL — ABNORMAL LOW (ref 12.0–15.0)
Lymphocytes Relative: 36.3 % (ref 12.0–46.0)
Lymphs Abs: 2 10*3/uL (ref 0.7–4.0)
MCHC: 33.4 g/dL (ref 30.0–36.0)
MCV: 94.6 fl (ref 78.0–100.0)
Monocytes Absolute: 0.3 10*3/uL (ref 0.1–1.0)
Monocytes Relative: 5.7 % (ref 3.0–12.0)
Neutro Abs: 3 10*3/uL (ref 1.4–7.7)
Neutrophils Relative %: 55.3 % (ref 43.0–77.0)
Platelets: 195 10*3/uL (ref 150.0–400.0)
RBC: 3.4 Mil/uL — ABNORMAL LOW (ref 3.87–5.11)
RDW: 17.4 % — ABNORMAL HIGH (ref 11.5–15.5)
WBC: 5.5 10*3/uL (ref 4.0–10.5)

## 2015-09-12 LAB — IBC PANEL
Iron: 50 ug/dL (ref 42–145)
Saturation Ratios: 16.1 % — ABNORMAL LOW (ref 20.0–50.0)
Transferrin: 222 mg/dL (ref 212.0–360.0)

## 2015-09-12 NOTE — Progress Notes (Signed)
Subjective:    Patient ID: Desiree Hutchinson, female    DOB: 1920/07/14, 81 y.o.   MRN: [id number]  HPI The state of at least three ongoing medical problems is addressed today, with interval history of each noted here: She was recently in the hospital for SBO: abd pain is much better Anemia: she denies BRBPR since hosp d/c.  She takes fe 1/day.   Hypokalemia: she denies muscle cramps.   Past Medical History  Diagnosis Date  . History of atherosclerotic cardiovascular disease   . Hypertension   . Left ventricular hypertrophy   . Gastritis   . Cough   . Personal history of tobacco use, presenting hazards to health   . Obesity   . Chest pain, non-cardiac   . Allergic rhinitis   . COPD (chronic obstructive pulmonary disease)   . History of small bowel obstruction   . Chronic diastolic heart failure   . Aortic valve disease     regurge > stenosis    Past Surgical History  Procedure Laterality Date  . Small bowel obstruction  06/1996  . Esophagogastroduodenoscopy  11/09/1999  . Adenoasine cardiolite  11/09/2004  . Transthoracic cardiolite   11/03/2006  . Appendectomy    . Hernia repair    . Bowel resection N/A 08/10/2015    Procedure: SMALL BOWEL OBSTRUCTION;  Surgeon: Desiree Fellows, MD;  Location: [facility];  Service: General;  Laterality: N/A;  . Laparotomy  08/10/2015    Procedure: EXPLORATORY LAPAROTOMY;  Surgeon: Desiree Fellows, MD;  Location: [facility];  Service: General;;  . Lysis of adhesion  08/10/2015    Procedure: LYSIS OF ADHESION;  Surgeon: Desiree Fellows, MD;  Location: [facility];  Service: General;;    Social History   Social History  . Marital Status: Widowed    Spouse Name: N/A  . Number of Children: N/A  . Years of Education: N/A   Occupational History  . Not on file.   Social History Main Topics  . Smoking status: Former Smoker -- 0.10 packs/day for 30 years    Types: Cigarettes    Quit date: 01/24/2010  . Smokeless tobacco: Never Used     Comment:  smoked 1-2 cigarettes/day  . Alcohol Use: No  . Drug Use: No  . Sexual Activity: Not Currently   Other Topics Concern  . Not on file   Social History Narrative    Current Outpatient Prescriptions on File Prior to Visit  Medication Sig Dispense Refill  . acetaminophen (TYLENOL) 325 MG tablet Take 1-2 tablets (325-650 mg total) by mouth every 6 (six) hours as needed for fever, headache, mild pain or moderate pain.    . carvedilol (COREG) 3.125 MG tablet Take 1 tablet (3.125 mg total) by mouth 2 (two) times daily with a meal. 60 tablet 1  . feeding supplement (BOOST / RESOURCE BREEZE) LIQD Take 1 Container by mouth 3 (three) times daily between meals. 90 Container 0  . ferrous sulfate 325 (65 FE) MG EC tablet Take 1 tablet (325 mg total) by mouth daily with breakfast. 30 tablet 0  . HYDROcodone-acetaminophen (NORCO/VICODIN) 5-325 MG per tablet Take 1 tablet by mouth every 4 (four) hours as needed for moderate pain or severe pain. 30 tablet 0  . lisinopril (PRINIVIL,ZESTRIL) 2.5 MG tablet Take 1 tablet (2.5 mg total) by mouth daily. 60 tablet 1  . polyethylene glycol powder (GLYCOLAX/MIRALAX) powder Take 17 g by mouth daily. 850 g 0  . sodium chloride (OCEAN) 0.65 % SOLN  nasal spray Place 1 spray into both nostrils as needed for congestion.    . Tiotropium Bromide Monohydrate (SPIRIVA RESPIMAT) 2.5 MCG/ACT AERS Inhale 2 puffs into the lungs daily. 4 g 11   No current facility-administered medications on file prior to visit.    No Known Allergies  No family history on file.  BP 112/60 mmHg  Pulse 77  Temp(Src) 97.9 F (36.6 C) (Oral)  Ht 5\' 1"  (1.549 m)  Wt 91 lb (41.277 kg)  BMI 17.20 kg/m2  SpO2 99%    Review of Systems Denies hematuria.  She has not regained the weight she lost.      Objective:   Physical Exam Vital signs: see vs page Gen: elderly, frail, no distress.  In wheelchair. LUNGS:  Clear to auscultation HEART:  Regular rate and rhythm without murmurs noted.  Normal S1,S2.     Lab Results  Component Value Date   CREATININE 0.88 09/12/2015   BUN 19 09/12/2015   NA 138 09/12/2015   K 3.8 09/12/2015   CL 104 09/12/2015   CO2 27 09/12/2015   Lab Results  Component Value Date   WBC 5.5 09/12/2015   HGB 10.8* 09/12/2015   HCT 32.2* 09/12/2015   MCV 94.6 09/12/2015   PLT 195.0 09/12/2015       Assessment & Plan:  Anemia: improved: i advised pt to continue the same fe tabs Hypokalemia: better: Please continue the same zestril SBO, abd pain is better, but weight loss persists: i advised pt to see a dietician, but she declines  Patient is advised the following: Patient Instructions  blood tests are being requested for you today.  We'll let you know about the results.  Please come back for a follow-up appointment in 2 months.  Please let me know if you want to see a dietician.

## 2015-09-12 NOTE — Patient Instructions (Addendum)
blood tests are being requested for you today.  We'll let you know about the results.  Please come back for a follow-up appointment in 2 months.  Please let me know if you want to see a dietician.

## 2015-09-15 LAB — PTH, INTACT AND CALCIUM
Calcium: 9.5 mg/dL (ref 8.4–10.5)
PTH: 81 pg/mL — ABNORMAL HIGH (ref 14–64)

## 2015-09-16 DIAGNOSIS — R5381 Other malaise: Secondary | ICD-10-CM | POA: Diagnosis not present

## 2015-09-16 DIAGNOSIS — M6281 Muscle weakness (generalized): Secondary | ICD-10-CM | POA: Diagnosis not present

## 2015-09-16 DIAGNOSIS — R2681 Unsteadiness on feet: Secondary | ICD-10-CM | POA: Diagnosis not present

## 2015-09-17 DIAGNOSIS — H6123 Impacted cerumen, bilateral: Secondary | ICD-10-CM | POA: Diagnosis not present

## 2015-09-17 DIAGNOSIS — H9193 Unspecified hearing loss, bilateral: Secondary | ICD-10-CM | POA: Diagnosis not present

## 2015-09-23 ENCOUNTER — Telehealth: Payer: Self-pay | Admitting: Endocrinology

## 2015-09-23 ENCOUNTER — Telehealth: Payer: Self-pay | Admitting: Cardiovascular Disease

## 2015-09-23 MED ORDER — FERROUS SULFATE 325 (65 FE) MG PO TBEC: 325.0000 mg | DELAYED_RELEASE_TABLET | Freq: Every day | ORAL | Status: DC

## 2015-09-23 NOTE — Telephone Encounter (Signed)
Pt c/o medication issue: 1. Name of Medication: Iron pill ( No Rx was given because it is over the counter)  2. How are you currently taking this medication (dosage and times per day)? Not sure of the dosage   4. What is your medication issue?  There was an iron pill that was mentioned for the pt but a script wasn't given so they are not clear on the name or the dose for the patient. Please call back to discuss.

## 2015-09-23 NOTE — Telephone Encounter (Signed)
Rx refilled per pt's request.  

## 2015-09-23 NOTE — Telephone Encounter (Signed)
Please refill prn 

## 2015-09-23 NOTE — Telephone Encounter (Signed)
Advised patient's granddaughter that pt's PCP, Dr. Everardo All is the MD that is prescribing the Fe tablet.  She stated she would call his office.

## 2015-09-23 NOTE — Telephone Encounter (Signed)
Patients daughter called and would like a refill on her medication   Rx: Iron Pills   Pharmacy: [facility] [address]    Thank you   Please advise  Call back: [phone]

## 2015-09-23 NOTE — Telephone Encounter (Signed)
Please advise if ok to to refill. Rx is listed under another provider. Thanks!

## 2015-09-24 ENCOUNTER — Telehealth: Payer: Self-pay | Admitting: Endocrinology

## 2015-09-24 DIAGNOSIS — J449 Chronic obstructive pulmonary disease, unspecified: Secondary | ICD-10-CM | POA: Diagnosis not present

## 2015-09-24 DIAGNOSIS — N189 Chronic kidney disease, unspecified: Secondary | ICD-10-CM | POA: Diagnosis not present

## 2015-09-24 DIAGNOSIS — I5030 Unspecified diastolic (congestive) heart failure: Secondary | ICD-10-CM | POA: Diagnosis not present

## 2015-09-24 DIAGNOSIS — K5710 Diverticulosis of small intestine without perforation or abscess without bleeding: Secondary | ICD-10-CM | POA: Diagnosis not present

## 2015-09-24 DIAGNOSIS — I129 Hypertensive chronic kidney disease with stage 1 through stage 4 chronic kidney disease, or unspecified chronic kidney disease: Secondary | ICD-10-CM | POA: Diagnosis not present

## 2015-09-24 DIAGNOSIS — M6281 Muscle weakness (generalized): Secondary | ICD-10-CM | POA: Diagnosis not present

## 2015-09-24 DIAGNOSIS — R32 Unspecified urinary incontinence: Secondary | ICD-10-CM

## 2015-09-24 NOTE — Telephone Encounter (Signed)
i need a diagnosis--is it loss of control of bowel or bladder function?

## 2015-09-24 NOTE — Telephone Encounter (Signed)
Can we rx the pt some pull ups? [facility]

## 2015-09-24 NOTE — Telephone Encounter (Signed)
See note below and please advise, Thanks! 

## 2015-09-25 NOTE — Telephone Encounter (Signed)
Requested call back from the pt's daughter to discuss diagnosis codes.

## 2015-09-26 DIAGNOSIS — I5030 Unspecified diastolic (congestive) heart failure: Secondary | ICD-10-CM | POA: Diagnosis not present

## 2015-09-26 DIAGNOSIS — R32 Unspecified urinary incontinence: Secondary | ICD-10-CM | POA: Insufficient documentation

## 2015-09-26 DIAGNOSIS — I129 Hypertensive chronic kidney disease with stage 1 through stage 4 chronic kidney disease, or unspecified chronic kidney disease: Secondary | ICD-10-CM | POA: Diagnosis not present

## 2015-09-26 DIAGNOSIS — K5710 Diverticulosis of small intestine without perforation or abscess without bleeding: Secondary | ICD-10-CM | POA: Diagnosis not present

## 2015-09-26 DIAGNOSIS — J449 Chronic obstructive pulmonary disease, unspecified: Secondary | ICD-10-CM | POA: Diagnosis not present

## 2015-09-26 DIAGNOSIS — M6281 Muscle weakness (generalized): Secondary | ICD-10-CM | POA: Diagnosis not present

## 2015-09-26 DIAGNOSIS — N189 Chronic kidney disease, unspecified: Secondary | ICD-10-CM | POA: Diagnosis not present

## 2015-09-26 MED ORDER — REALITY INCONTINENT BRIEFS SM MISC: Status: DC

## 2015-09-26 NOTE — Addendum Note (Signed)
Addended by: [name] on: 09/26/2015 12:10 PM   Modules accepted: Orders

## 2015-09-26 NOTE — Telephone Encounter (Signed)
Pt's daughter called stating dx code is loss of bladder function.

## 2015-09-26 NOTE — Telephone Encounter (Signed)
i have sent a prescription to [facility]

## 2015-09-29 DIAGNOSIS — N189 Chronic kidney disease, unspecified: Secondary | ICD-10-CM | POA: Diagnosis not present

## 2015-09-29 DIAGNOSIS — J449 Chronic obstructive pulmonary disease, unspecified: Secondary | ICD-10-CM | POA: Diagnosis not present

## 2015-09-29 DIAGNOSIS — I5030 Unspecified diastolic (congestive) heart failure: Secondary | ICD-10-CM | POA: Diagnosis not present

## 2015-09-29 DIAGNOSIS — K5710 Diverticulosis of small intestine without perforation or abscess without bleeding: Secondary | ICD-10-CM | POA: Diagnosis not present

## 2015-09-29 DIAGNOSIS — I129 Hypertensive chronic kidney disease with stage 1 through stage 4 chronic kidney disease, or unspecified chronic kidney disease: Secondary | ICD-10-CM | POA: Diagnosis not present

## 2015-09-29 DIAGNOSIS — M6281 Muscle weakness (generalized): Secondary | ICD-10-CM | POA: Diagnosis not present

## 2015-09-30 DIAGNOSIS — N189 Chronic kidney disease, unspecified: Secondary | ICD-10-CM | POA: Diagnosis not present

## 2015-09-30 DIAGNOSIS — K5710 Diverticulosis of small intestine without perforation or abscess without bleeding: Secondary | ICD-10-CM | POA: Diagnosis not present

## 2015-09-30 DIAGNOSIS — I129 Hypertensive chronic kidney disease with stage 1 through stage 4 chronic kidney disease, or unspecified chronic kidney disease: Secondary | ICD-10-CM | POA: Diagnosis not present

## 2015-09-30 DIAGNOSIS — M6281 Muscle weakness (generalized): Secondary | ICD-10-CM | POA: Diagnosis not present

## 2015-09-30 DIAGNOSIS — I5030 Unspecified diastolic (congestive) heart failure: Secondary | ICD-10-CM | POA: Diagnosis not present

## 2015-09-30 DIAGNOSIS — J449 Chronic obstructive pulmonary disease, unspecified: Secondary | ICD-10-CM | POA: Diagnosis not present

## 2015-10-01 DIAGNOSIS — N189 Chronic kidney disease, unspecified: Secondary | ICD-10-CM | POA: Diagnosis not present

## 2015-10-01 DIAGNOSIS — I129 Hypertensive chronic kidney disease with stage 1 through stage 4 chronic kidney disease, or unspecified chronic kidney disease: Secondary | ICD-10-CM | POA: Diagnosis not present

## 2015-10-01 DIAGNOSIS — I5030 Unspecified diastolic (congestive) heart failure: Secondary | ICD-10-CM | POA: Diagnosis not present

## 2015-10-01 DIAGNOSIS — M6281 Muscle weakness (generalized): Secondary | ICD-10-CM | POA: Diagnosis not present

## 2015-10-01 DIAGNOSIS — J449 Chronic obstructive pulmonary disease, unspecified: Secondary | ICD-10-CM | POA: Diagnosis not present

## 2015-10-01 DIAGNOSIS — K5710 Diverticulosis of small intestine without perforation or abscess without bleeding: Secondary | ICD-10-CM | POA: Diagnosis not present

## 2015-10-02 DIAGNOSIS — J449 Chronic obstructive pulmonary disease, unspecified: Secondary | ICD-10-CM | POA: Diagnosis not present

## 2015-10-02 DIAGNOSIS — I129 Hypertensive chronic kidney disease with stage 1 through stage 4 chronic kidney disease, or unspecified chronic kidney disease: Secondary | ICD-10-CM | POA: Diagnosis not present

## 2015-10-02 DIAGNOSIS — M6281 Muscle weakness (generalized): Secondary | ICD-10-CM | POA: Diagnosis not present

## 2015-10-02 DIAGNOSIS — K5710 Diverticulosis of small intestine without perforation or abscess without bleeding: Secondary | ICD-10-CM | POA: Diagnosis not present

## 2015-10-02 DIAGNOSIS — I5030 Unspecified diastolic (congestive) heart failure: Secondary | ICD-10-CM | POA: Diagnosis not present

## 2015-10-02 DIAGNOSIS — N189 Chronic kidney disease, unspecified: Secondary | ICD-10-CM | POA: Diagnosis not present

## 2015-10-03 DIAGNOSIS — I129 Hypertensive chronic kidney disease with stage 1 through stage 4 chronic kidney disease, or unspecified chronic kidney disease: Secondary | ICD-10-CM | POA: Diagnosis not present

## 2015-10-03 DIAGNOSIS — I5030 Unspecified diastolic (congestive) heart failure: Secondary | ICD-10-CM | POA: Diagnosis not present

## 2015-10-03 DIAGNOSIS — J449 Chronic obstructive pulmonary disease, unspecified: Secondary | ICD-10-CM | POA: Diagnosis not present

## 2015-10-03 DIAGNOSIS — M6281 Muscle weakness (generalized): Secondary | ICD-10-CM | POA: Diagnosis not present

## 2015-10-03 DIAGNOSIS — K5710 Diverticulosis of small intestine without perforation or abscess without bleeding: Secondary | ICD-10-CM | POA: Diagnosis not present

## 2015-10-03 DIAGNOSIS — N189 Chronic kidney disease, unspecified: Secondary | ICD-10-CM | POA: Diagnosis not present

## 2015-10-06 DIAGNOSIS — J449 Chronic obstructive pulmonary disease, unspecified: Secondary | ICD-10-CM | POA: Diagnosis not present

## 2015-10-06 DIAGNOSIS — M6281 Muscle weakness (generalized): Secondary | ICD-10-CM | POA: Diagnosis not present

## 2015-10-06 DIAGNOSIS — N189 Chronic kidney disease, unspecified: Secondary | ICD-10-CM | POA: Diagnosis not present

## 2015-10-06 DIAGNOSIS — I5030 Unspecified diastolic (congestive) heart failure: Secondary | ICD-10-CM | POA: Diagnosis not present

## 2015-10-06 DIAGNOSIS — I129 Hypertensive chronic kidney disease with stage 1 through stage 4 chronic kidney disease, or unspecified chronic kidney disease: Secondary | ICD-10-CM | POA: Diagnosis not present

## 2015-10-06 DIAGNOSIS — K5710 Diverticulosis of small intestine without perforation or abscess without bleeding: Secondary | ICD-10-CM | POA: Diagnosis not present

## 2015-10-07 DIAGNOSIS — M6281 Muscle weakness (generalized): Secondary | ICD-10-CM | POA: Diagnosis not present

## 2015-10-07 DIAGNOSIS — I5030 Unspecified diastolic (congestive) heart failure: Secondary | ICD-10-CM | POA: Diagnosis not present

## 2015-10-07 DIAGNOSIS — J449 Chronic obstructive pulmonary disease, unspecified: Secondary | ICD-10-CM | POA: Diagnosis not present

## 2015-10-07 DIAGNOSIS — N189 Chronic kidney disease, unspecified: Secondary | ICD-10-CM | POA: Diagnosis not present

## 2015-10-07 DIAGNOSIS — I129 Hypertensive chronic kidney disease with stage 1 through stage 4 chronic kidney disease, or unspecified chronic kidney disease: Secondary | ICD-10-CM | POA: Diagnosis not present

## 2015-10-07 DIAGNOSIS — K5710 Diverticulosis of small intestine without perforation or abscess without bleeding: Secondary | ICD-10-CM | POA: Diagnosis not present

## 2015-10-08 DIAGNOSIS — I129 Hypertensive chronic kidney disease with stage 1 through stage 4 chronic kidney disease, or unspecified chronic kidney disease: Secondary | ICD-10-CM | POA: Diagnosis not present

## 2015-10-08 DIAGNOSIS — J449 Chronic obstructive pulmonary disease, unspecified: Secondary | ICD-10-CM | POA: Diagnosis not present

## 2015-10-08 DIAGNOSIS — I5030 Unspecified diastolic (congestive) heart failure: Secondary | ICD-10-CM | POA: Diagnosis not present

## 2015-10-08 DIAGNOSIS — K5710 Diverticulosis of small intestine without perforation or abscess without bleeding: Secondary | ICD-10-CM | POA: Diagnosis not present

## 2015-10-08 DIAGNOSIS — M6281 Muscle weakness (generalized): Secondary | ICD-10-CM | POA: Diagnosis not present

## 2015-10-08 DIAGNOSIS — N189 Chronic kidney disease, unspecified: Secondary | ICD-10-CM | POA: Diagnosis not present

## 2015-10-09 DIAGNOSIS — I129 Hypertensive chronic kidney disease with stage 1 through stage 4 chronic kidney disease, or unspecified chronic kidney disease: Secondary | ICD-10-CM | POA: Diagnosis not present

## 2015-10-09 DIAGNOSIS — N189 Chronic kidney disease, unspecified: Secondary | ICD-10-CM | POA: Diagnosis not present

## 2015-10-09 DIAGNOSIS — M6281 Muscle weakness (generalized): Secondary | ICD-10-CM | POA: Diagnosis not present

## 2015-10-09 DIAGNOSIS — I5030 Unspecified diastolic (congestive) heart failure: Secondary | ICD-10-CM | POA: Diagnosis not present

## 2015-10-09 DIAGNOSIS — K5710 Diverticulosis of small intestine without perforation or abscess without bleeding: Secondary | ICD-10-CM | POA: Diagnosis not present

## 2015-10-09 DIAGNOSIS — J449 Chronic obstructive pulmonary disease, unspecified: Secondary | ICD-10-CM | POA: Diagnosis not present

## 2015-10-10 DIAGNOSIS — K5710 Diverticulosis of small intestine without perforation or abscess without bleeding: Secondary | ICD-10-CM | POA: Diagnosis not present

## 2015-10-10 DIAGNOSIS — N189 Chronic kidney disease, unspecified: Secondary | ICD-10-CM | POA: Diagnosis not present

## 2015-10-10 DIAGNOSIS — J449 Chronic obstructive pulmonary disease, unspecified: Secondary | ICD-10-CM | POA: Diagnosis not present

## 2015-10-10 DIAGNOSIS — M6281 Muscle weakness (generalized): Secondary | ICD-10-CM | POA: Diagnosis not present

## 2015-10-10 DIAGNOSIS — I5030 Unspecified diastolic (congestive) heart failure: Secondary | ICD-10-CM | POA: Diagnosis not present

## 2015-10-10 DIAGNOSIS — I129 Hypertensive chronic kidney disease with stage 1 through stage 4 chronic kidney disease, or unspecified chronic kidney disease: Secondary | ICD-10-CM | POA: Diagnosis not present

## 2015-10-13 DIAGNOSIS — M6281 Muscle weakness (generalized): Secondary | ICD-10-CM | POA: Diagnosis not present

## 2015-10-13 DIAGNOSIS — I5030 Unspecified diastolic (congestive) heart failure: Secondary | ICD-10-CM | POA: Diagnosis not present

## 2015-10-13 DIAGNOSIS — I129 Hypertensive chronic kidney disease with stage 1 through stage 4 chronic kidney disease, or unspecified chronic kidney disease: Secondary | ICD-10-CM | POA: Diagnosis not present

## 2015-10-13 DIAGNOSIS — J449 Chronic obstructive pulmonary disease, unspecified: Secondary | ICD-10-CM | POA: Diagnosis not present

## 2015-10-13 DIAGNOSIS — N189 Chronic kidney disease, unspecified: Secondary | ICD-10-CM | POA: Diagnosis not present

## 2015-10-13 DIAGNOSIS — K5710 Diverticulosis of small intestine without perforation or abscess without bleeding: Secondary | ICD-10-CM | POA: Diagnosis not present

## 2015-10-15 DIAGNOSIS — M6281 Muscle weakness (generalized): Secondary | ICD-10-CM | POA: Diagnosis not present

## 2015-10-15 DIAGNOSIS — N189 Chronic kidney disease, unspecified: Secondary | ICD-10-CM | POA: Diagnosis not present

## 2015-10-15 DIAGNOSIS — J449 Chronic obstructive pulmonary disease, unspecified: Secondary | ICD-10-CM | POA: Diagnosis not present

## 2015-10-15 DIAGNOSIS — K5710 Diverticulosis of small intestine without perforation or abscess without bleeding: Secondary | ICD-10-CM | POA: Diagnosis not present

## 2015-10-15 DIAGNOSIS — I5030 Unspecified diastolic (congestive) heart failure: Secondary | ICD-10-CM | POA: Diagnosis not present

## 2015-10-15 DIAGNOSIS — I129 Hypertensive chronic kidney disease with stage 1 through stage 4 chronic kidney disease, or unspecified chronic kidney disease: Secondary | ICD-10-CM | POA: Diagnosis not present

## 2015-10-16 DIAGNOSIS — J449 Chronic obstructive pulmonary disease, unspecified: Secondary | ICD-10-CM | POA: Diagnosis not present

## 2015-10-16 DIAGNOSIS — I129 Hypertensive chronic kidney disease with stage 1 through stage 4 chronic kidney disease, or unspecified chronic kidney disease: Secondary | ICD-10-CM | POA: Diagnosis not present

## 2015-10-16 DIAGNOSIS — I5030 Unspecified diastolic (congestive) heart failure: Secondary | ICD-10-CM | POA: Diagnosis not present

## 2015-10-16 DIAGNOSIS — K5710 Diverticulosis of small intestine without perforation or abscess without bleeding: Secondary | ICD-10-CM | POA: Diagnosis not present

## 2015-10-16 DIAGNOSIS — M6281 Muscle weakness (generalized): Secondary | ICD-10-CM | POA: Diagnosis not present

## 2015-10-16 DIAGNOSIS — N189 Chronic kidney disease, unspecified: Secondary | ICD-10-CM | POA: Diagnosis not present

## 2015-10-20 ENCOUNTER — Other Ambulatory Visit: Payer: Self-pay | Admitting: Endocrinology

## 2015-10-20 DIAGNOSIS — K5710 Diverticulosis of small intestine without perforation or abscess without bleeding: Secondary | ICD-10-CM | POA: Diagnosis not present

## 2015-10-20 DIAGNOSIS — J449 Chronic obstructive pulmonary disease, unspecified: Secondary | ICD-10-CM | POA: Diagnosis not present

## 2015-10-20 DIAGNOSIS — I5030 Unspecified diastolic (congestive) heart failure: Secondary | ICD-10-CM | POA: Diagnosis not present

## 2015-10-20 DIAGNOSIS — N189 Chronic kidney disease, unspecified: Secondary | ICD-10-CM | POA: Diagnosis not present

## 2015-10-20 DIAGNOSIS — M6281 Muscle weakness (generalized): Secondary | ICD-10-CM | POA: Diagnosis not present

## 2015-10-20 DIAGNOSIS — I129 Hypertensive chronic kidney disease with stage 1 through stage 4 chronic kidney disease, or unspecified chronic kidney disease: Secondary | ICD-10-CM | POA: Diagnosis not present

## 2015-10-21 DIAGNOSIS — M6281 Muscle weakness (generalized): Secondary | ICD-10-CM | POA: Diagnosis not present

## 2015-10-21 DIAGNOSIS — J449 Chronic obstructive pulmonary disease, unspecified: Secondary | ICD-10-CM | POA: Diagnosis not present

## 2015-10-21 DIAGNOSIS — K5710 Diverticulosis of small intestine without perforation or abscess without bleeding: Secondary | ICD-10-CM | POA: Diagnosis not present

## 2015-10-21 DIAGNOSIS — I129 Hypertensive chronic kidney disease with stage 1 through stage 4 chronic kidney disease, or unspecified chronic kidney disease: Secondary | ICD-10-CM | POA: Diagnosis not present

## 2015-10-21 DIAGNOSIS — N189 Chronic kidney disease, unspecified: Secondary | ICD-10-CM | POA: Diagnosis not present

## 2015-10-21 DIAGNOSIS — I5030 Unspecified diastolic (congestive) heart failure: Secondary | ICD-10-CM | POA: Diagnosis not present

## 2015-10-22 DIAGNOSIS — J449 Chronic obstructive pulmonary disease, unspecified: Secondary | ICD-10-CM | POA: Diagnosis not present

## 2015-10-22 DIAGNOSIS — I5030 Unspecified diastolic (congestive) heart failure: Secondary | ICD-10-CM | POA: Diagnosis not present

## 2015-10-22 DIAGNOSIS — M6281 Muscle weakness (generalized): Secondary | ICD-10-CM | POA: Diagnosis not present

## 2015-10-22 DIAGNOSIS — N189 Chronic kidney disease, unspecified: Secondary | ICD-10-CM | POA: Diagnosis not present

## 2015-10-22 DIAGNOSIS — K5710 Diverticulosis of small intestine without perforation or abscess without bleeding: Secondary | ICD-10-CM | POA: Diagnosis not present

## 2015-10-22 DIAGNOSIS — I129 Hypertensive chronic kidney disease with stage 1 through stage 4 chronic kidney disease, or unspecified chronic kidney disease: Secondary | ICD-10-CM | POA: Diagnosis not present

## 2015-10-29 DIAGNOSIS — I5030 Unspecified diastolic (congestive) heart failure: Secondary | ICD-10-CM | POA: Diagnosis not present

## 2015-10-29 DIAGNOSIS — J449 Chronic obstructive pulmonary disease, unspecified: Secondary | ICD-10-CM | POA: Diagnosis not present

## 2015-10-29 DIAGNOSIS — M6281 Muscle weakness (generalized): Secondary | ICD-10-CM | POA: Diagnosis not present

## 2015-10-29 DIAGNOSIS — K5710 Diverticulosis of small intestine without perforation or abscess without bleeding: Secondary | ICD-10-CM | POA: Diagnosis not present

## 2015-10-29 DIAGNOSIS — N189 Chronic kidney disease, unspecified: Secondary | ICD-10-CM | POA: Diagnosis not present

## 2015-10-29 DIAGNOSIS — I129 Hypertensive chronic kidney disease with stage 1 through stage 4 chronic kidney disease, or unspecified chronic kidney disease: Secondary | ICD-10-CM | POA: Diagnosis not present

## 2015-11-07 ENCOUNTER — Other Ambulatory Visit: Payer: Self-pay | Admitting: Endocrinology

## 2015-11-10 ENCOUNTER — Ambulatory Visit (INDEPENDENT_AMBULATORY_CARE_PROVIDER_SITE_OTHER): Payer: Medicare Other | Admitting: Pulmonary Disease

## 2015-11-10 ENCOUNTER — Encounter: Payer: Self-pay | Admitting: Pulmonary Disease

## 2015-11-10 VITALS — BP 132/64 | HR 88 | Ht 60.0 in | Wt 94.8 lb

## 2015-11-10 DIAGNOSIS — J439 Emphysema, unspecified: Secondary | ICD-10-CM | POA: Diagnosis not present

## 2015-11-10 DIAGNOSIS — I2510 Atherosclerotic heart disease of native coronary artery without angina pectoris: Secondary | ICD-10-CM | POA: Diagnosis not present

## 2015-11-10 DIAGNOSIS — Z23 Encounter for immunization: Secondary | ICD-10-CM | POA: Diagnosis not present

## 2015-11-10 NOTE — Patient Instructions (Signed)
Continue using the spiriva Flu vaccination today.  Return to clinic in 6 months.

## 2015-11-10 NOTE — Progress Notes (Signed)
   Subjective:    Patient ID: Desiree Hutchinson, female    DOB: 1920/11/04, 81 y.o.   MRN: [id number]  HPI COPD Gold class I  Desiree Hutchinson is a 81 year old with mild emphysema. Gold class I. She is a former patient of Dr. Delford FieldWright. She's been maintained on Spiriva with improvement in symptoms. She does not have any recent exacerbations or ER visits. She is hospitalized in July 2016 with an acute diverticular bleed. She was transfused but did not end up getting a colonoscopy. Her bleeding resolved spontaneously. She was also hospitalized in August 2016 with a small bowel obstruction. She this was initially managed conservatively however she had to undergo surgery with lysis of adhesions. She did not have any issues with her breathing during these 2 hospitalizations.  DATA: Spirometry 01/08/14 FVC 2.08 [119%] FEV1 1.06 [89%] F/F 51  Chest x-ray 08/06/15 Mild bibasilar atelectasis, mild vascular congestion.  Review of Systems Has dyspnea on exertion at baseline. Denies any cough, sputum production, fevers, chills, hemoptysis. Denies any chest pain, palpitations. Denies any abdominal pain, nausea, vomiting, diarrhea. Denies any fevers, chills, loss of weight, loss of appetite. All other review of systems are negative    Objective:   Physical Exam  Blood pressure 132/64, pulse 88, height 5' (1.524 m), weight 94 lb 12.8 oz (43.001 kg), SpO2 100 %.  Gen.: Pleasant elderly female. No apparent distress Neuro: No gross focal deficits. Neck: No JVD, lymphadenopathy, thyromegaly. RS: Clear, no wheeze, crackles, non-labored breathing. CVS: S1-S2 heard, no murmurs rubs gallops. Abdomen: Soft, positive bowel sounds. Extremities: No edema.     Assessment & Plan:  COPD Gold class I  Stable on Spiriva. No exacerbations or ER visits for respiratory problems. She'll continue using the same as directed. She'll get a flu shot today.  Return to clinic in 6 months.  Chilton GreathousePraveen Amarie Viles MD [place]  [facility] Pager [phone] If no answer or after 3pm call: [phone] 11/10/2015, 1:55 PM

## 2015-11-12 ENCOUNTER — Other Ambulatory Visit (INDEPENDENT_AMBULATORY_CARE_PROVIDER_SITE_OTHER): Payer: Medicare Other

## 2015-11-12 ENCOUNTER — Ambulatory Visit (INDEPENDENT_AMBULATORY_CARE_PROVIDER_SITE_OTHER): Payer: Medicare Other | Admitting: Endocrinology

## 2015-11-12 ENCOUNTER — Encounter: Payer: Self-pay | Admitting: Endocrinology

## 2015-11-12 VITALS — BP 112/50 | HR 76 | Temp 98.3°F | Ht 60.0 in | Wt 95.0 lb

## 2015-11-12 DIAGNOSIS — N289 Disorder of kidney and ureter, unspecified: Secondary | ICD-10-CM

## 2015-11-12 DIAGNOSIS — I359 Nonrheumatic aortic valve disorder, unspecified: Secondary | ICD-10-CM | POA: Diagnosis not present

## 2015-11-12 DIAGNOSIS — D62 Acute posthemorrhagic anemia: Secondary | ICD-10-CM | POA: Diagnosis not present

## 2015-11-12 DIAGNOSIS — I2510 Atherosclerotic heart disease of native coronary artery without angina pectoris: Secondary | ICD-10-CM | POA: Diagnosis not present

## 2015-11-12 LAB — BASIC METABOLIC PANEL
BUN: 24 mg/dL — ABNORMAL HIGH (ref 6–23)
CO2: 25 mEq/L (ref 19–32)
Calcium: 9.7 mg/dL (ref 8.4–10.5)
Chloride: 106 mEq/L (ref 96–112)
Creatinine, Ser: 1.14 mg/dL (ref 0.40–1.20)
GFR: 57.11 mL/min — ABNORMAL LOW (ref >60.00)
Glucose, Bld: 91 mg/dL (ref 70–99)
Potassium: 4.2 mEq/L (ref 3.5–5.1)
Sodium: 137 mEq/L (ref 135–145)

## 2015-11-12 LAB — CBC WITH DIFFERENTIAL/PLATELET
Basophils Absolute: 0 10*3/uL (ref 0.0–0.1)
Basophils Relative: 0.4 % (ref 0.0–3.0)
Eosinophils Absolute: 0.1 10*3/uL (ref 0.0–0.7)
Eosinophils Relative: 1.2 % (ref 0.0–5.0)
HCT: 34.1 % — ABNORMAL LOW (ref 36.0–46.0)
Hemoglobin: 10.9 g/dL — ABNORMAL LOW (ref 12.0–15.0)
Lymphocytes Relative: 36.7 % (ref 12.0–46.0)
Lymphs Abs: 1.9 10*3/uL (ref 0.7–4.0)
MCHC: 32.1 g/dL (ref 30.0–36.0)
MCV: 94.3 fl (ref 78.0–100.0)
Monocytes Absolute: 0.3 10*3/uL (ref 0.1–1.0)
Monocytes Relative: 5.5 % (ref 3.0–12.0)
Neutro Abs: 2.8 10*3/uL (ref 1.4–7.7)
Neutrophils Relative %: 56.2 % (ref 43.0–77.0)
Platelets: 201 10*3/uL (ref 150.0–400.0)
RBC: 3.61 Mil/uL — ABNORMAL LOW (ref 3.87–5.11)
RDW: 15.4 % (ref 11.5–15.5)
WBC: 5.1 10*3/uL (ref 4.0–10.5)

## 2015-11-12 LAB — IBC PANEL
Iron: 47 ug/dL (ref 42–145)
Saturation Ratios: 15.3 % — ABNORMAL LOW (ref 20.0–50.0)
Transferrin: 219 mg/dL (ref 212.0–360.0)

## 2015-11-12 MED ORDER — BOOST / RESOURCE BREEZE PO LIQD: 1.0000 | Freq: Three times a day (TID) | ORAL | Status: DC

## 2015-11-12 NOTE — Patient Instructions (Addendum)
blood tests are requested for you today.  We'll let you know about the results.   you will receive a phone call, about a day and time for an appointment, to go back to see Dr Excell Seltzerooper.  it is critically important to prevent falling down (keep floor areas well-lit, dry, and free of loose objects.  you should use your walker, even for short trips around the house.  Also, try not to rush).   Please come back for a "medicare wellness" appointment in 3 months.

## 2015-11-12 NOTE — Progress Notes (Signed)
Subjective:    Patient ID: Desiree Hutchinson, female    DOB: [birth year], 81 y.o.   MRN: [id number]  HPI The state of at least three ongoing medical problems is addressed today, with interval history of each noted here: She was in the hospital for SBO: she has regained a few lbs.  Pt says her appetite is good.  Anemia: she denies BRBPR since hosp d/c.  She takes fe 1/day.   Hypokalemia: she denies muscle cramps.   AS: denies sob.   Past Medical History  Diagnosis Date  . History of atherosclerotic cardiovascular disease   . Hypertension   . Left ventricular hypertrophy   . Gastritis   . Cough   . Personal history of tobacco use, presenting hazards to health   . Obesity   . Chest pain, non-cardiac   . Allergic rhinitis   . COPD (chronic obstructive pulmonary disease) (HCC)   . History of small bowel obstruction   . Chronic diastolic heart failure (HCC)   . Aortic valve disease     regurge > stenosis    Past Surgical History  Procedure Laterality Date  . Small bowel obstruction  06/1996  . Esophagogastroduodenoscopy  11/09/1999  . Adenoasine cardiolite  11/09/2004  . Transthoracic cardiolite   11/03/2006  . Appendectomy    . Hernia repair    . Bowel resection N/A 08/10/2015    Procedure: SMALL BOWEL OBSTRUCTION;  Surgeon: Glenna Fellows, MD;  Location: [facility];  Service: General;  Laterality: N/A;  . Laparotomy  08/10/2015    Procedure: EXPLORATORY LAPAROTOMY;  Surgeon: Glenna Fellows, MD;  Location: [facility];  Service: General;;  . Lysis of adhesion  08/10/2015    Procedure: LYSIS OF ADHESION;  Surgeon: Glenna Fellows, MD;  Location: [facility];  Service: General;;    Social History   Social History  . Marital Status: Widowed    Spouse Name: N/A  . Number of Children: N/A  . Years of Education: N/A   Occupational History  . Not on file.   Social History Main Topics  . Smoking status: Former Smoker -- 0.10 packs/day for 30 years    Types: Cigarettes    Quit date:  01/24/2010  . Smokeless tobacco: Never Used     Comment: smoked 1-2 cigarettes/day  . Alcohol Use: No  . Drug Use: No  . Sexual Activity: Not Currently   Other Topics Concern  . Not on file   Social History Narrative    Current Outpatient Prescriptions on File Prior to Visit  Medication Sig Dispense Refill  . acetaminophen (TYLENOL) 325 MG tablet Take 1-2 tablets (325-650 mg total) by mouth every 6 (six) hours as needed for fever, headache, mild pain or moderate pain.    Marland Kitchen amLODipine (NORVASC) 5 MG tablet Take 5 mg by mouth daily.  5  . carvedilol (COREG) 3.125 MG tablet Take 1 tablet (3.125 mg total) by mouth 2 (two) times daily with a meal. 60 tablet 1  . ferrous sulfate 325 (65 FE) MG tablet TAKE 1 TABLET (325 MG TOTAL) BY MOUTH DAILY WITH BREAKFAST. 30 tablet 0  . HYDROcodone-acetaminophen (NORCO/VICODIN) 5-325 MG per tablet Take 1 tablet by mouth every 4 (four) hours as needed for moderate pain or severe pain. 30 tablet 0  . Incontinence Supply Disposable (REALITY INCONTINENT BRIEFS SM) MISC 10 per day  R32 300 each 11  . lisinopril (PRINIVIL,ZESTRIL) 2.5 MG tablet Take 1 tablet (2.5 mg total) by mouth daily. 60 tablet 1  .  polyethylene glycol powder (GLYCOLAX/MIRALAX) powder Take 17 g by mouth daily. 850 g 0  . simethicone (MYLICON) 80 MG chewable tablet Chew 80 mg by mouth every 6 (six) hours as needed for flatulence.    . sodium chloride (OCEAN) 0.65 % SOLN nasal spray Place 1 spray into both nostrils as needed for congestion.    . Tiotropium Bromide Monohydrate (SPIRIVA RESPIMAT) 2.5 MCG/ACT AERS Inhale 2 puffs into the lungs daily. 4 g 11   No current facility-administered medications on file prior to visit.    No Known Allergies  No family history on file.  BP 112/50 mmHg  Pulse 76  Temp(Src) 98.3 F (36.8 C) (Oral)  Ht 5' (1.524 m)  Wt 95 lb (43.092 kg)  BMI 18.55 kg/m2  SpO2 97%  Review of Systems Denies hematuria.  She still has generalized weakness.       Objective:   Physical Exam Vital signs: see vs page.   Gen: elderly, frail, no distress.  Ext: no edema.    Lab Results  Component Value Date   WBC 5.1 11/12/2015   HGB 10.9* 11/12/2015   HCT 34.1* 11/12/2015   MCV 94.3 11/12/2015   PLT 201.0 11/12/2015   Lab Results  Component Value Date   CREATININE 1.14 11/12/2015   BUN 24* 11/12/2015   NA 137 11/12/2015   K 4.2 11/12/2015   CL 106 11/12/2015   CO2 25 11/12/2015      Assessment & Plan:  fe-deficiency anemia, persistent:  Hypokalemia: better.  We'll follow AS: she needs cardiol f/u: Calorie malnutrition, improved: as her appetite is good, I can't certify the medical necessity of "Boost."  Patient is advised the following: Patient Instructions  blood tests are requested for you today.  We'll let you know about the results.   you will receive a phone call, about a day and time for an appointment, to go back to see Dr Excell Seltzerooper.  it is critically important to prevent falling down (keep floor areas well-lit, dry, and free of loose objects.  you should use your walker, even for short trips around the house.  Also, try not to rush).   Please come back for a "medicare wellness" appointment in 3 months.

## 2015-12-14 NOTE — Progress Notes (Signed)
Cardiology Office Note   Date:  12/16/2015   ID:  Desiree Hutchinson, DOB 10/07/1920, MRN [id number]  PCP:  Romero BellingELLISON, SEAN, MD  Cardiologist:  Dr. Excell Seltzerooper   1 year follow up- moderate AS    History of Present Illness: Desiree JohnsJessie S Hutchinson is a 81 y.o. female with a history of moderate AS, HTN, COPD and chronic diastolic CHF who presents to clinic for post hospital follow up.  She was last seen by Dr. Excell Seltzerooper in 11/2014 for yearly follow up. She was doing well from a cardiac standpoint. She noted unintentional weight loss and ensure was recommended. She also was felt to have chronic dyspnea that was multifactorial due to chronic diastolic CHF, mod AS and COPD. She had some improvement with spiriva. From an aortic stenosis standpoint, it was felt to be stable and remain in a moderate range. Dr. Excell Seltzerooper felt that continued clinical monitoring was best in this 81 year old woman.   Admitted in 07/2015 for SBO. Surgery consulted initially managed conservatively, but after several days, patient no better and was taken to the operating room for lysis of adhesions. Postop only complicated by ileus which has since resolved. Patient did require TPN briefly, however once ileus resolved, TPN discontinued and patient started on diet. Staples removed 8/26 and cleared for skilled nursing. She left SNF in 08/2015 and now back living with granddaughter- who is here with her today.   Today she presents for follow up. She has been doing quite well. 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 She walks with a walker and is mostly limited by having to stay close to walker to avoid falls. She denies CP or worsening SOB. She had chronic dyspnea on spriva. No LE edema, orthopnea or PND. No dizziness or passing out. No complaints. Feeling well. Her weight is stable at 94 lbs.   Past Medical History  Diagnosis Date  . History of atherosclerotic cardiovascular disease   . Hypertension   . Left  ventricular hypertrophy   . Gastritis   . Cough   . Personal history of tobacco use, presenting hazards to health   . Obesity   . Chest pain, non-cardiac   . Allergic rhinitis   . COPD (chronic obstructive pulmonary disease) (HCC)   . History of small bowel obstruction   . Chronic diastolic heart failure (HCC)   . Aortic valve disease     regurge > stenosis    Past Surgical History  Procedure Laterality Date  . Small bowel obstruction  06/1996  . Esophagogastroduodenoscopy  11/09/1999  . Adenoasine cardiolite  11/09/2004  . Transthoracic cardiolite   11/03/2006  . Appendectomy    . Hernia repair    . Bowel resection N/A 08/10/2015    Procedure: SMALL BOWEL OBSTRUCTION;  Surgeon: Glenna FellowsBenjamin Hoxworth, MD;  Location: [facility];  Service: General;  Laterality: N/A;  . Laparotomy  08/10/2015    Procedure: EXPLORATORY LAPAROTOMY;  Surgeon: Glenna FellowsBenjamin Hoxworth, MD;  Location: [facility];  Service: General;;  . Lysis of adhesion  08/10/2015    Procedure: LYSIS OF ADHESION;  Surgeon: Glenna FellowsBenjamin Hoxworth, MD;  Location: [facility];  Service: General;;     Current Outpatient Prescriptions  Medication Sig Dispense Refill  . acetaminophen (TYLENOL) 325 MG tablet Take 1-2 tablets (325-650 mg total) by mouth every 6 (six) hours as needed for fever, headache, mild pain or moderate pain.    Marland Kitchen. amLODipine (NORVASC) 5 MG tablet Take 5 mg by mouth daily.  5  . carvedilol (COREG) 3.125 MG tablet Take 1 tablet (3.125 mg total) by mouth 2 (two) times daily with a meal. 60 tablet 1  . feeding supplement (BOOST / RESOURCE BREEZE) LIQD Take 1 Container by mouth 3 (three) times daily between meals. 90 Container 11  . ferrous sulfate 325 (65 FE) MG tablet TAKE 1 TABLET (325 MG TOTAL) BY MOUTH DAILY WITH BREAKFAST. 30 tablet 0  . hydrALAZINE (APRESOLINE) 25 MG tablet Take 1 tablet by mouth daily.    Marland Kitchen HYDROcodone-acetaminophen (NORCO/VICODIN) 5-325 MG per tablet Take 1 tablet by mouth every 4 (four) hours as needed for moderate  pain or severe pain. 30 tablet 0  . Incontinence Supply Disposable (REALITY INCONTINENT BRIEFS SM) MISC 10 per day  R32 300 each 11  . lisinopril (PRINIVIL,ZESTRIL) 2.5 MG tablet Take 1 tablet (2.5 mg total) by mouth daily. 60 tablet 1  . polyethylene glycol powder (GLYCOLAX/MIRALAX) powder Take 17 g by mouth daily. 850 g 0  . simethicone (MYLICON) 80 MG chewable tablet Chew 80 mg by mouth every 6 (six) hours as needed for flatulence.    . sodium chloride (OCEAN) 0.65 % SOLN nasal spray Place 1 spray into both nostrils as needed for congestion.    . Tiotropium Bromide Monohydrate (SPIRIVA RESPIMAT) 2.5 MCG/ACT AERS Inhale 2 puffs into the lungs daily. 4 g 11   No current facility-administered medications for this visit.    Allergies:   Review of patient's allergies indicates no known allergies.    Social History:  The patient  reports that she quit smoking about 5 years ago. Her smoking use included Cigarettes. She has a 3 pack-year smoking history. She has never used smokeless tobacco. She reports that she does not drink alcohol or use illicit drugs.   Family History:  The patient's family history is not on file. due to patients old age.    ROS:  Please see the history of present illness.   Otherwise, review of systems are positive for NONE.   All other systems are reviewed and negative.    PHYSICAL EXAM: VS:  BP 115/60 mmHg  Pulse 77  Ht 5' (1.524 m)  Wt 94 lb 12.8 oz (43.001 kg)  BMI 18.51 kg/m2 , BMI Body mass index is 18.51 kg/(m^2). GEN: Well nourished, well developed, in no acute distressElderly and thin HEENT: normal Neck: no JVD, carotid bruits, or masses Cardiac: RRR; 3/6 SEM at RUSB, rubs, or gallops,no edema  Respiratory:  clear to auscultation bilaterally, normal work of breathing GI: soft, nontender, nondistended, + BS MS: no deformity or atrophy Skin: warm and dry, no rash Neuro:  Strength and sensation are intact Psych: euthymic mood, full affect   EKG:  EKG   is ordered today. The ekg ordered today demonstrates NSR with non specific ST changes that are similar to previous    Recent Labs: 01/10/2015: TSH 2.21 08/18/2015: ALT 18; Magnesium 2.1 08/20/2015: B Natriuretic Peptide 68.4 11/12/2015: BUN 24*; Creatinine, Ser 1.14; Hemoglobin 10.9*; Platelets 201.0; Potassium 4.2; Sodium 137    Lipid Panel    Component Value Date/Time   CHOL 198 01/10/2015 1611   TRIG 68 08/18/2015 0531   TRIG 103 10/27/2006 1030   HDL 88.60 01/10/2015 1611   CHOLHDL 2 01/10/2015 1611   CHOLHDL 3.6 CALC 10/27/2006 1030   VLDL 10.2 01/10/2015 1611   LDLCALC 99 01/10/2015 1611   LDLDIRECT 104.6 03/27/2013 1405   LDLDIRECT 134.8 10/27/2006 1030      Wt Readings from  Last 3 Encounters:  12/16/15 94 lb 12.8 oz (43.001 kg)  11/12/15 95 lb (43.092 kg)  11/10/15 94 lb 12.8 oz (43.001 kg)      Other studies Reviewed: Additional studies/ records that were reviewed today include: 2D ECHO Review of the above records demonstrates:   2-D echocardiogram 12/18/2014: Study Conclusions  - Left ventricle: The cavity size was normal. Wall thickness was increased in a pattern of severe LVH. Systolic function was vigorous. The estimated ejection fraction was in the range of 65% to 70%. Doppler parameters are consistent with abnormal left ventricular relaxation (grade 1 diastolic dysfunction). The E/e&' ratio is between 8-15, suggesting indeterminate LV filling pressure. - Aortic valve: Moderately calcified aortic valves. There is moderate aortic stenosis - peak and mean gradients of 54 and 28 mmHg, respectively. Based on an LVOT diameter of 2.0 cm, the calculated AVA is 1.1 cm2. There was moderate, posteriorly-directed regurgitation. Valve area (VTI): 1.08 cm^2. - Mitral valve: Moderate posterior MAC. Mildly thickened leaflets . - Left atrium: Moderately dilated at 41 ml/m2. - Tricuspid valve: There was mild regurgitation. - Pulmonary arteries: PA  peak pressure: 31 mm Hg (S). - Inferior vena cava: The vessel was normal in size. The respirophasic diameter changes were in the normal range (>= 50%), consistent with normal central venous pressure. Impressions: - Compared to the prior echo in 2014, the aortic valve may be minimally more stenotic, but remains moderate   ASSESSMENT AND PLAN:  Desiree Hutchinson is a 81 y.o. female with a history of moderate AS, HTN, COPD and chronic diastolic CHF who presents to clinic for post hospital follow up.  1. Moderate aortic stenosis: no new symptoms.  Per Dr. [name] last note we will follow clinically. She seems to be doing quite well. We will not repeat 2D ECHO unless she has new symptoms  2. Chronic dyspnea: Felt to be multifactorial in this patient with aortic stenosis, diastolic dysfunction, and COPD. She's followed by Dr. Delford Field.   3. Essential hypertension. BP 115/60 today. Stable on her current medical program.    Current medicines are reviewed at length with the patient today.  The patient does not have concerns regarding medicines.  The following changes have been made:  no change  Labs/ tests ordered today include:  No orders of the defined types were placed in this encounter.     Disposition:   Follow up with Dr. Excell Seltzer in 12 months unless she has symptoms in the interim we are happy to see her sooner  Signed, Allena Katz  12/16/2015 10:09 AM    [facility] [address] [place], [place], [place]  [address] Phone: [phone]; Fax: [phone]

## 2015-12-15 ENCOUNTER — Other Ambulatory Visit: Payer: Self-pay | Admitting: Endocrinology

## 2015-12-16 ENCOUNTER — Encounter: Payer: Self-pay | Admitting: Physician Assistant

## 2015-12-16 ENCOUNTER — Ambulatory Visit (INDEPENDENT_AMBULATORY_CARE_PROVIDER_SITE_OTHER): Payer: Medicare Other | Admitting: Physician Assistant

## 2015-12-16 VITALS — BP 115/60 | HR 77 | Ht 60.0 in | Wt 94.8 lb

## 2015-12-16 DIAGNOSIS — I359 Nonrheumatic aortic valve disorder, unspecified: Secondary | ICD-10-CM

## 2015-12-16 DIAGNOSIS — I2510 Atherosclerotic heart disease of native coronary artery without angina pectoris: Secondary | ICD-10-CM

## 2015-12-16 NOTE — Patient Instructions (Signed)
Medication Instructions:  Your physician recommends that you continue on your current medications as directed. Please refer to the Current Medication list given to you today.   Labwork: None ordered  Testing/Procedures: None ordered  Follow-Up: Your physician wants you to follow-up in: 1 YEAR WITH DR. COOPER   You will receive a reminder letter in the mail two months in advance. If you don't receive a letter, please call our office to schedule the follow-up appointment.   Any Other Special Instructions Will Be Listed Below (If Applicable).     If you need a refill on your cardiac medications before your next appointment, please call your pharmacy.   

## 2016-01-15 ENCOUNTER — Other Ambulatory Visit: Payer: Self-pay

## 2016-01-15 NOTE — Patient Outreach (Signed)
[facility]) Care Management  01/15/2016  Desiree Hutchinson 1920/07/10 [id number]   Telephone Screen  Referral Date: 01/14/16 Referral Source: Next Gen Tier 4 list Referral Reason: CHF & COPD with 2 admits  Outreach attempt # 1 to patient. No answer and unable to leave voicemail message.   Plan: RN CM will make outreach attempt call within a week.  Antionette Fairy, RN,BSN,CCM [facility] Care Management Telephonic Care Management Coordinator Direct Phone: [phone] Toll Free: [phone] Fax: [phone]

## 2016-01-19 ENCOUNTER — Other Ambulatory Visit: Payer: Self-pay

## 2016-01-19 NOTE — Patient Outreach (Signed)
[facility]) Care Management  01/19/2016  Desiree Hutchinson 1920-05-30 [id number]   Telephone Screen  Referral Date: 01/14/16 Referral Source: Next Gen Tier 4 list Referral Reason: CHF & COPD with 2 admits   Outreach attempt # 2 to patient. Line busy after several attempts at [phone] and no answer at [phone].   Plan: RN CM will attempt outreach call to patient within a week.  Antionette Fairy, RN,BSN,CCM [facility] Care Management Telephonic Care Management Coordinator Direct Phone: [phone] Toll Free: [phone] Fax: [phone]

## 2016-01-20 ENCOUNTER — Telehealth: Payer: Self-pay | Admitting: Cardiovascular Disease

## 2016-01-20 NOTE — Telephone Encounter (Signed)
I spoke with Evette and made her aware that I did receive [facility] fax.  I advised her that Dr Excell Seltzer is out of the office until 01/30/16 and that I can have a PA/NP sign the form.  If [facility] will not accept the PA/NP signature then Evette will contact the office and I will have Dr Excell Seltzer sign the form when he returns.    I reviewed the form after speaking with Evette and noticed that the hydralazine order was different from what we have documented in the pt's chart.  I have left a voicemail for Evette to contact the office about medication.

## 2016-01-20 NOTE — Telephone Encounter (Signed)
F/u    Desiree Hutchinson returning your call.

## 2016-01-20 NOTE — Telephone Encounter (Signed)
I spoke with Desiree Hutchinson and she has been giving the pt hydralazine  twice a day.  I also contacted the pharmacy and the pt's prescription has been dispensed this way for greater than 6 months. I will correct our medication list in Epic.   Paperwork signed and faxed to [phone].  I made Desiree Hutchinson aware of correct dosing for hydralazine.

## 2016-01-20 NOTE — Telephone Encounter (Signed)
New message     Grand-daughter calling patient need a form complete for medication . Will be faxing over form - patient at [facility] during the day .    Hydralazine  25 mg one tab by mouth daily

## 2016-01-21 ENCOUNTER — Other Ambulatory Visit: Payer: Self-pay

## 2016-01-21 ENCOUNTER — Ambulatory Visit: Payer: Self-pay

## 2016-01-21 NOTE — Patient Outreach (Signed)
[facility]) Care Management  01/21/2016  ERICIA MOXLEY [birth year] [id number]   Telephone Screen  Referral Date: 01/14/16 Referral Source: Next Gen Tier 4 list Referral Reason: CHF & COPD with 2 admits   Noted in patient's chart that patient is a part of [facility] program. [facility] does not follow and co-manage with [facility] services. Will close out referral and case at this time.   Plan: RN CM will notify [facility] administrative assistant of case status.  Antionette Fairy, RN,BSN,CCM [facility] Care Management Telephonic Care Management Coordinator Direct Phone: [phone] Toll Free: [phone] Fax: [phone]

## 2016-02-03 ENCOUNTER — Other Ambulatory Visit: Payer: Self-pay | Admitting: Endocrinology

## 2016-02-05 ENCOUNTER — Other Ambulatory Visit: Payer: Self-pay | Admitting: Cardiovascular Disease

## 2016-02-13 ENCOUNTER — Encounter: Payer: Self-pay | Admitting: Endocrinology

## 2016-02-13 ENCOUNTER — Ambulatory Visit (INDEPENDENT_AMBULATORY_CARE_PROVIDER_SITE_OTHER): Payer: Medicare Other | Admitting: Endocrinology

## 2016-02-13 VITALS — BP 116/62 | HR 89 | Temp 97.8°F | Ht 60.0 in | Wt 97.0 lb

## 2016-02-13 DIAGNOSIS — N289 Disorder of kidney and ureter, unspecified: Secondary | ICD-10-CM

## 2016-02-13 DIAGNOSIS — D509 Iron deficiency anemia, unspecified: Secondary | ICD-10-CM | POA: Diagnosis not present

## 2016-02-13 LAB — BASIC METABOLIC PANEL
BUN: 26 mg/dL — ABNORMAL HIGH (ref 6–23)
CO2: 26 mEq/L (ref 19–32)
Calcium: 9.9 mg/dL (ref 8.4–10.5)
Chloride: 104 mEq/L (ref 96–112)
Creatinine, Ser: 1.26 mg/dL — ABNORMAL HIGH (ref 0.40–1.20)
GFR: 50.85 mL/min — ABNORMAL LOW (ref >60.00)
Glucose, Bld: 93 mg/dL (ref 70–99)
Potassium: 4.7 mEq/L (ref 3.5–5.1)
Sodium: 137 mEq/L (ref 135–145)

## 2016-02-13 LAB — IBC PANEL
Iron: 78 ug/dL (ref 42–145)
Saturation Ratios: 23.5 % (ref 20.0–50.0)
Transferrin: 237 mg/dL (ref 212.0–360.0)

## 2016-02-13 LAB — CBC WITH DIFFERENTIAL/PLATELET
Basophils Absolute: 0 10*3/uL (ref 0.0–0.1)
Basophils Relative: 0.6 % (ref 0.0–3.0)
Eosinophils Absolute: 0.1 10*3/uL (ref 0.0–0.7)
Eosinophils Relative: 1.4 % (ref 0.0–5.0)
HCT: 34.2 % — ABNORMAL LOW (ref 36.0–46.0)
Hemoglobin: 11.4 g/dL — ABNORMAL LOW (ref 12.0–15.0)
Lymphocytes Relative: 42.6 % (ref 12.0–46.0)
Lymphs Abs: 1.9 10*3/uL (ref 0.7–4.0)
MCHC: 33.5 g/dL (ref 30.0–36.0)
MCV: 93.5 fl (ref 78.0–100.0)
Monocytes Absolute: 0.4 10*3/uL (ref 0.1–1.0)
Monocytes Relative: 8.4 % (ref 3.0–12.0)
Neutro Abs: 2.1 10*3/uL (ref 1.4–7.7)
Neutrophils Relative %: 47 % (ref 43.0–77.0)
Platelets: 198 10*3/uL (ref 150.0–400.0)
RBC: 3.65 Mil/uL — ABNORMAL LOW (ref 3.87–5.11)
RDW: 14.8 % (ref 11.5–15.5)
WBC: 4.4 10*3/uL (ref 4.0–10.5)

## 2016-02-13 NOTE — Progress Notes (Signed)
we discussed code status.  pt requests DNR 

## 2016-02-13 NOTE — Patient Instructions (Addendum)
good diet and exercise significantly improve your health.  please let me know if you wish to be referred to a dietician.  high blood sugar is very risky to your health.  you should see an eye doctor and dentist every year.  It is very important to get all recommended vaccinations.  please consider these measures for your health:  minimize alcohol.  do not use tobacco products.  have a colonoscopy at least every 10 years from age 79.  Women should have an annual mammogram from age 78.  keep firearms safely stored.  always use seat belts.  have working smoke alarms in your home.  see an eye doctor and dentist regularly.  never drive under the influence of alcohol or drugs (including prescription drugs).   it is critically important to prevent falling down (keep floor areas well-lit, dry, and free of loose objects.  If you have a cane, walker, or wheelchair, you should use it, even for short trips around the house.  Also, try not to rush). blood tests are requested for you today.  We'll let you know about the results. Please come back for a follow-up appointment in 3-6 months.

## 2016-02-13 NOTE — Progress Notes (Signed)
Subjective:    Patient ID: Desiree Hutchinson, female    DOB: 05-16-1920, 82 y.o.   MRN: [id number]  HPI The state of at least three ongoing medical problems is addressed today, with interval history of each noted here: Anemia: She takes fe 1/day.  Denies BRBPR.  Renal insuff: she denies dysuria HTN: she denies sob. Past Medical History  Diagnosis Date  . History of atherosclerotic cardiovascular disease   . Hypertension   . Left ventricular hypertrophy   . Gastritis   . Cough   . Personal history of tobacco use, presenting hazards to health   . Obesity   . Chest pain, non-cardiac   . Allergic rhinitis   . COPD (chronic obstructive pulmonary disease) (HCC)   . History of small bowel obstruction   . Chronic diastolic heart failure (HCC)   . Aortic valve disease     regurge > stenosis    Past Surgical History  Procedure Laterality Date  . Small bowel obstruction  06/1996  . Esophagogastroduodenoscopy  11/09/1999  . Adenoasine cardiolite  11/09/2004  . Transthoracic cardiolite   11/03/2006  . Appendectomy    . Hernia repair    . Bowel resection N/A 08/10/2015    Procedure: SMALL BOWEL OBSTRUCTION;  Surgeon: Desiree Fellows, MD;  Location: [facility];  Service: General;  Laterality: N/A;  . Laparotomy  08/10/2015    Procedure: EXPLORATORY LAPAROTOMY;  Surgeon: Desiree Fellows, MD;  Location: [facility];  Service: General;;  . Lysis of adhesion  08/10/2015    Procedure: LYSIS OF ADHESION;  Surgeon: Desiree Fellows, MD;  Location: [facility];  Service: General;;    Social History   Social History  . Marital Status: Widowed    Spouse Name: N/A  . Number of Children: N/A  . Years of Education: N/A   Occupational History  . Not on file.   Social History Main Topics  . Smoking status: Former Smoker -- 0.10 packs/day for 30 years    Types: Cigarettes    Quit date: 01/24/2010  . Smokeless tobacco: Never Used     Comment: smoked 1-2 cigarettes/day  . Alcohol Use: No  . Drug Use:  No  . Sexual Activity: Not Currently   Other Topics Concern  . Not on file   Social History Narrative    Current Outpatient Prescriptions on File Prior to Visit  Medication Sig Dispense Refill  . acetaminophen (TYLENOL) 325 MG tablet Take 1-2 tablets (325-650 mg total) by mouth every 6 (six) hours as needed for fever, headache, mild pain or moderate pain.    Marland Kitchen amLODipine (NORVASC) 5 MG tablet Take 5 mg by mouth daily.  5  . carvedilol (COREG) 3.125 MG tablet Take 1 tablet (3.125 mg total) by mouth 2 (two) times daily with a meal. 60 tablet 1  . feeding supplement (BOOST / RESOURCE BREEZE) LIQD Take 1 Container by mouth 3 (three) times daily between meals. 90 Container 11  . ferrous sulfate 325 (65 FE) MG tablet TAKE 1 TABLET (325 MG TOTAL) BY MOUTH DAILY WITH BREAKFAST. 30 tablet 0  . hydrALAZINE (APRESOLINE) 25 MG tablet Take 1 tablet by mouth 2 (two) times daily.     Marland Kitchen HYDROcodone-acetaminophen (NORCO/VICODIN) 5-325 MG per tablet Take 1 tablet by mouth every 4 (four) hours as needed for moderate pain or severe pain. 30 tablet 0  . Incontinence Supply Disposable (REALITY INCONTINENT BRIEFS SM) MISC 10 per day  R32 300 each 11  . lisinopril (PRINIVIL,ZESTRIL) 2.5 MG  tablet TAKE 1 TABLET (2.5 MG TOTAL) BY MOUTH DAILY. 90 tablet 3  . polyethylene glycol powder (GLYCOLAX/MIRALAX) powder Take 17 g by mouth daily. 850 g 0  . simethicone (MYLICON) 80 MG chewable tablet Chew 80 mg by mouth every 6 (six) hours as needed for flatulence.    . sodium chloride (OCEAN) 0.65 % SOLN nasal spray Place 1 spray into both nostrils as needed for congestion.    . Tiotropium Bromide Monohydrate (SPIRIVA RESPIMAT) 2.5 MCG/ACT AERS Inhale 2 puffs into the lungs daily. 4 g 11   No current facility-administered medications on file prior to visit.    No Known Allergies  No family history on file.  BP 116/62 mmHg  Pulse 89  Temp(Src) 97.8 F (36.6 C) (Oral)  Ht 5' (1.524 m)  Wt 97 lb (43.999 kg)  BMI 18.94  kg/m2  SpO2 97%  Review of Systems Denies hematuria and edema     Objective:   Physical Exam VITAL SIGNS:  See vs page GENERAL: no distress LUNGS:  Clear to auscultation HEART:  Regular rate and rhythm without murmurs noted. Normal S1,S2.     Lab Results  Component Value Date   WBC 4.4 02/13/2016   HGB 11.4* 02/13/2016   HCT 34.2* 02/13/2016   MCV 93.5 02/13/2016   PLT 198.0 02/13/2016   Lab Results  Component Value Date   CREATININE 1.26* 02/13/2016   BUN 26* 02/13/2016   NA 137 02/13/2016   K 4.7 02/13/2016   CL 104 02/13/2016   CO2 26 02/13/2016       Assessment & Plan:  Anemia: improved.  She is advised to continue the same fe tabs Renal failure: worse: we'll follow HTN: well-controlled.  Same medication   Subjective:   Patient here for Medicare annual wellness visit and management of other chronic and acute problems.     Risk factors: advanced age    Roster of Physicians Providing Medical Care to Patient:  See "snapshot"   Activities of Daily Living: In your present state of health, do you have any difficulty performing the following activities (lives with granddaughter Desiree Hutchinson, who helps)?:  Preparing food and eating?: yes Bathing yourself: No  Getting dressed: No  Using the toilet:No  Moving around from place to place: No  In the past year have you fallen or had a near fall?: No  Home Safety: Has smoke detector and wears seat belts. No firearms.   Diet and Exercise  Current exercise habits: limited by health problems Dietary issues discussed: healthy diet   Depression Screen  Q1: Over the past two weeks, have you felt down, depressed or hopeless?n o  Q2: Over the past two weeks, have you felt little interest or pleasure in doing things? no   The following portions of the patient's history were reviewed and updated as appropriate: allergies, current medications, past family history, past medical history, past social history, past surgical  history and problem list.   Review of Systems  Denies visual loss Objective:   Vision:  Sees opthalmologist Hearing: grossly slightly decreased (ins did not pay for HA) Body mass index:  See vs page Msk: pt easily and quickly performs "get-up-and-go" from a sitting position Cognitive Impairment Assessment: cognition, memory and judgment appear normal.  remembers 3/3 at 5 minutes.  excellent recall.  can easily read and write a sentence.  alert and oriented x 3 (except she says it is 02/14/16).     Assessment:   Medicare wellness utd on  preventive parameters    Plan:   During the course of the visit the patient was educated and counseled about appropriate screening and preventive services including:       Fall prevention   Screening mammography  Bone densitometry screening  Diabetes screening  Nutrition counseling   Vaccines / LABS Zostavax / Pneumococcal Vaccine  today   Patient Instructions (the written plan) was given to the patient.

## 2016-02-16 ENCOUNTER — Telehealth: Payer: Self-pay | Admitting: Endocrinology

## 2016-02-16 DIAGNOSIS — M79675 Pain in left toe(s): Secondary | ICD-10-CM | POA: Diagnosis not present

## 2016-02-16 DIAGNOSIS — L600 Ingrowing nail: Secondary | ICD-10-CM | POA: Diagnosis not present

## 2016-02-16 DIAGNOSIS — M2570 Osteophyte, unspecified joint: Secondary | ICD-10-CM | POA: Diagnosis not present

## 2016-02-16 NOTE — Telephone Encounter (Signed)
Pt care giver called about lab results and if she still needs to continue the iron supplement.

## 2016-02-16 NOTE — Telephone Encounter (Signed)
I contacted the pt's granddaughter and advised mild anemia is still seen based of the recent blood test. Pt is to continue taking the iron supplement at this time. I requested a call back if the pt's granddaughter would like to discuss.

## 2016-03-03 ENCOUNTER — Other Ambulatory Visit: Payer: Self-pay | Admitting: Endocrinology

## 2016-03-05 DIAGNOSIS — L97529 Non-pressure chronic ulcer of other part of left foot with unspecified severity: Secondary | ICD-10-CM | POA: Diagnosis not present

## 2016-03-15 ENCOUNTER — Other Ambulatory Visit: Payer: Self-pay | Admitting: *Deleted

## 2016-03-15 DIAGNOSIS — I7092 Chronic total occlusion of artery of the extremities: Secondary | ICD-10-CM

## 2016-03-22 IMAGING — CR DG ABDOMEN 1V
1 series · 1 of 1 positions shown · non-contrast
Comparison: CT abdomen 08/06/2015

CLINICAL DATA: NG placement

EXAM:
ABDOMEN - 1 VIEW

[ap (kub)]
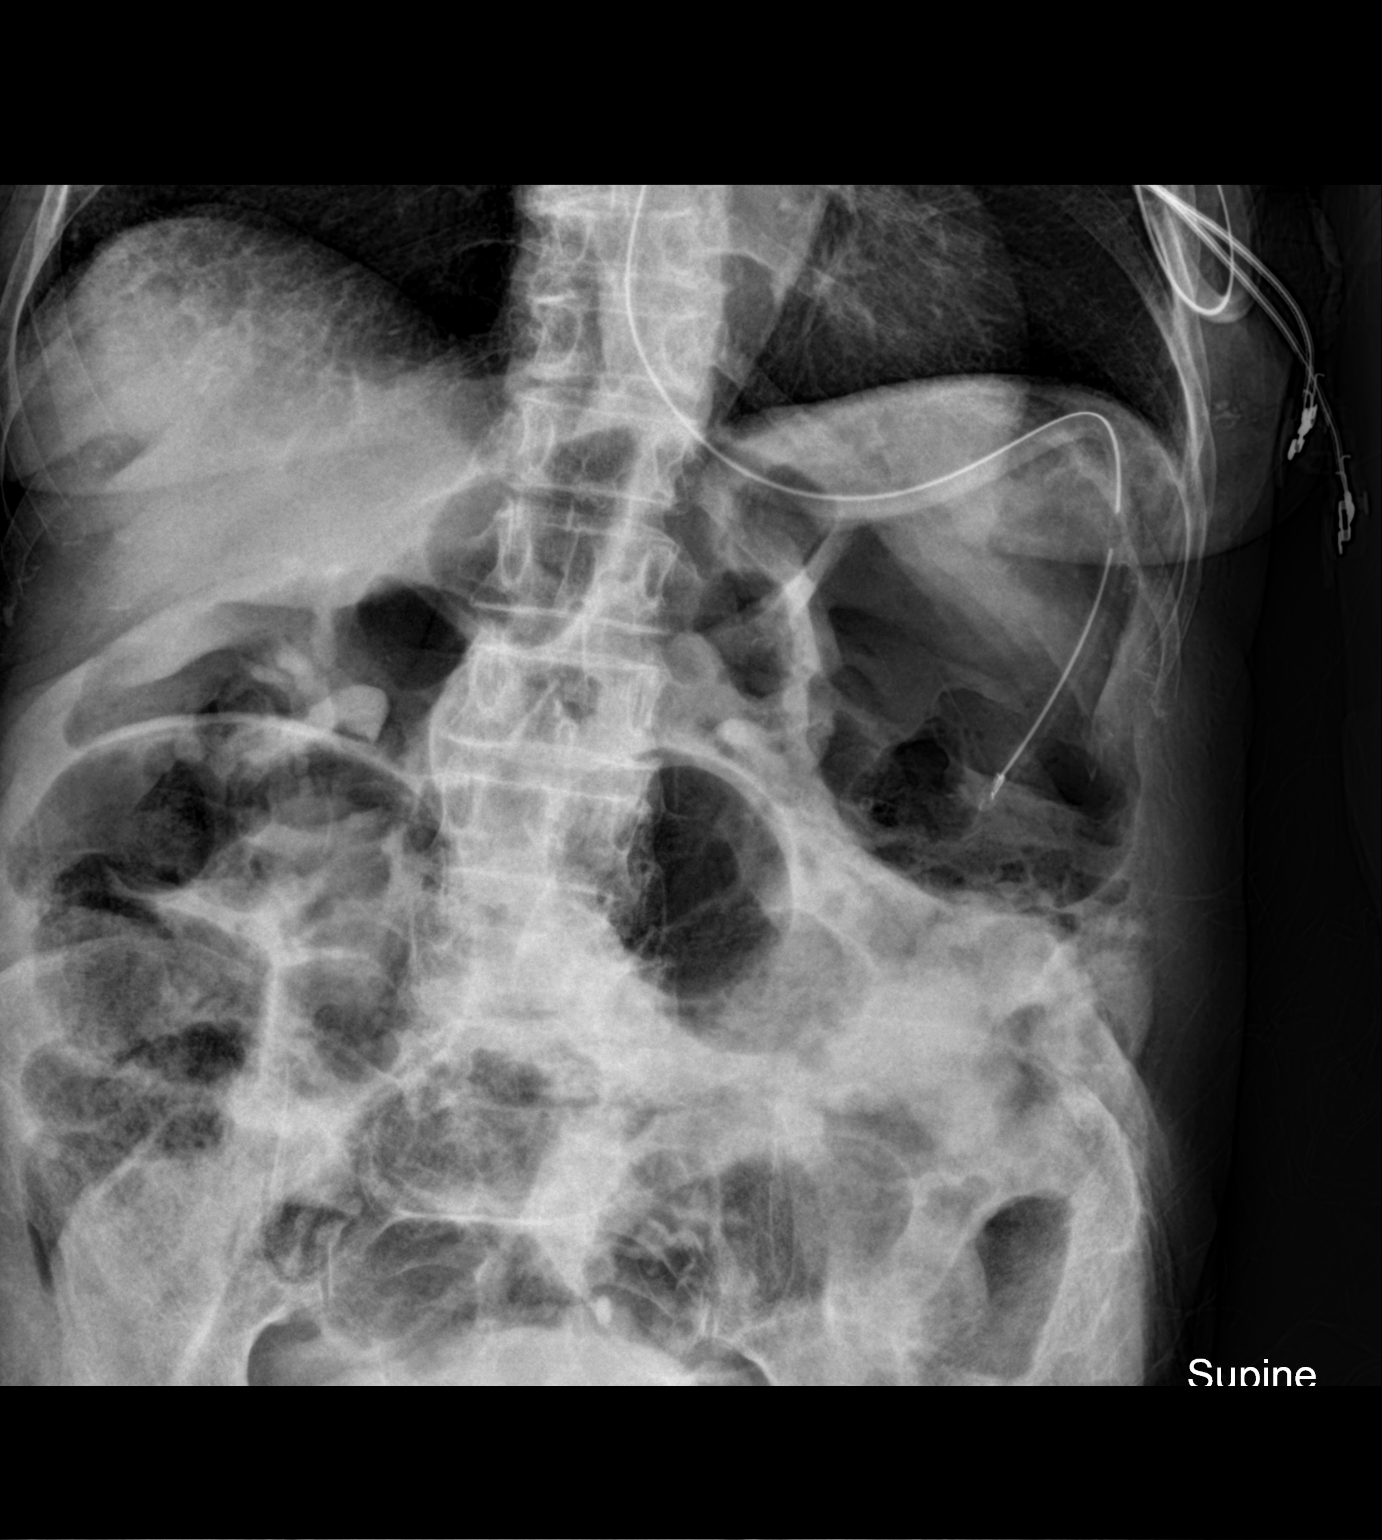

[1 of 1 positions shown; findings below may reference images not displayed]

FINDINGS: NG tube coiled in the body of the stomach. Mild ileus. Negative for
bowel obstruction.
IMPRESSION: NG tube in the body the stomach.

Mild ileus.

## 2016-03-22 IMAGING — CT CT ABD-PELV W/ CM
2 of 6 series · 16 of 46 positions shown, 18 images · IV contrast (OMNIPAQUE 300)
Comparison: 07/20/2015

CLINICAL DATA: Generalized abdominal pain

EXAM:
CT ABDOMEN AND PELVIS WITH CONTRAST
TECHNIQUE: Multidetector CT imaging of the abdomen and pelvis was performed
using the standard protocol following bolus administration of
intravenous contrast.
CONTRAST:  50mL OMNIPAQUE IOHEXOL 300 MG/ML SOLN, 80mL OMNIPAQUE
IOHEXOL 300 MG/ML SOLN

[Series 2: abd/pel with · axial · 0.79mm/px · z∈[+1089,+1439]mm · 13 of 82 slices shown, 15 images]
[im 6/82  soft-tissue]
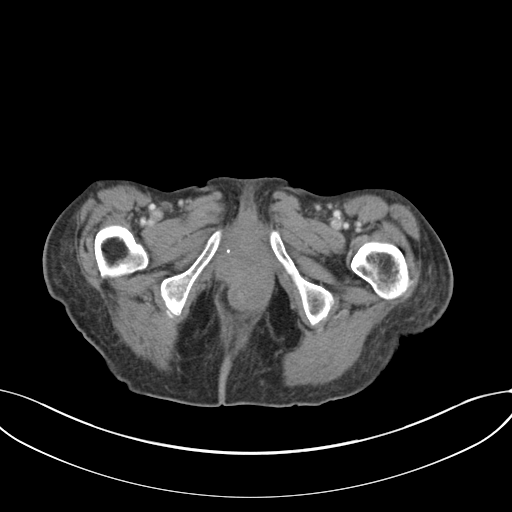
[im 6/82  bone]
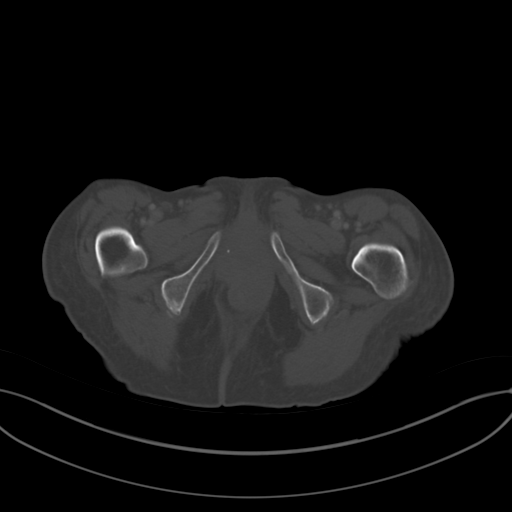
[im 11/82  soft-tissue]
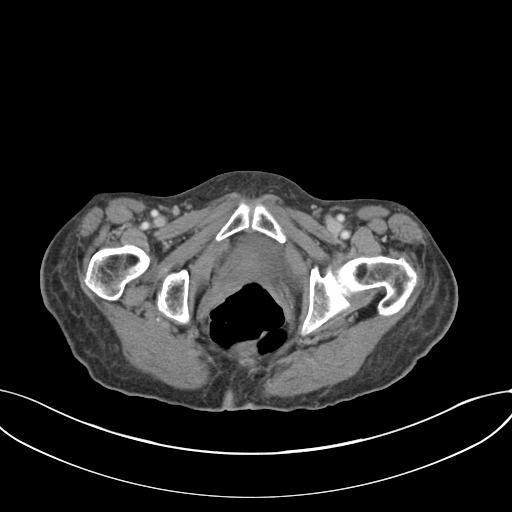
[im 16/82  soft-tissue]
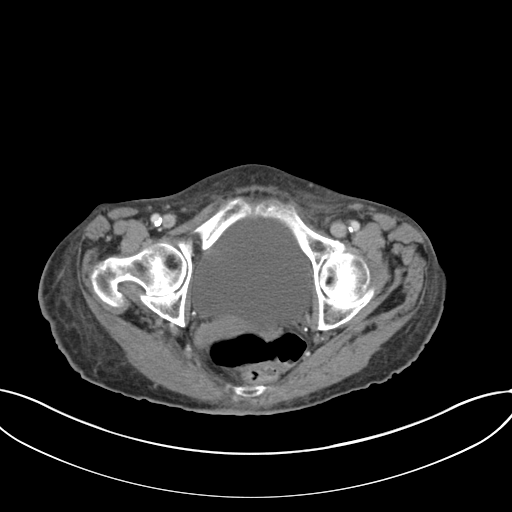
[im 26/82  soft-tissue]
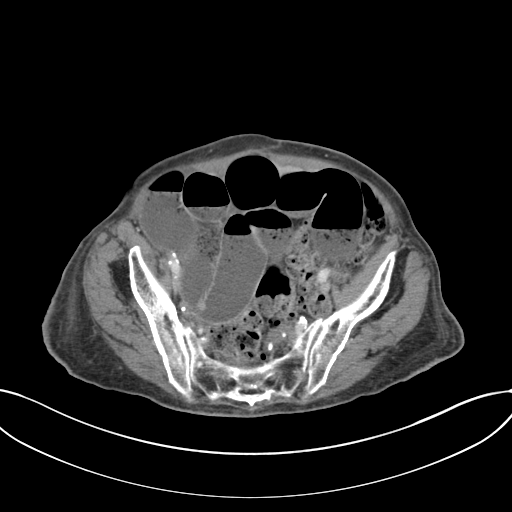
[im 31/82  soft-tissue]
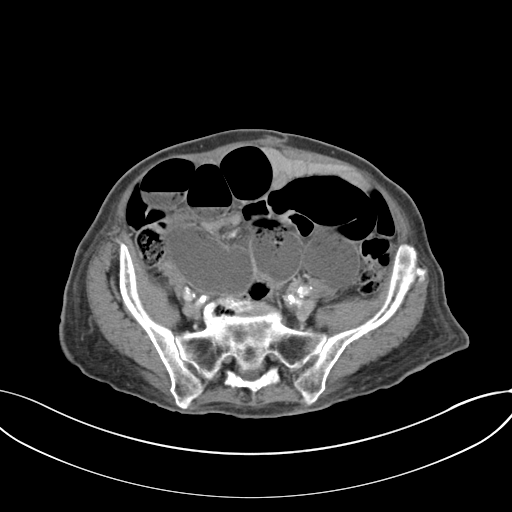
[im 36/82  soft-tissue]
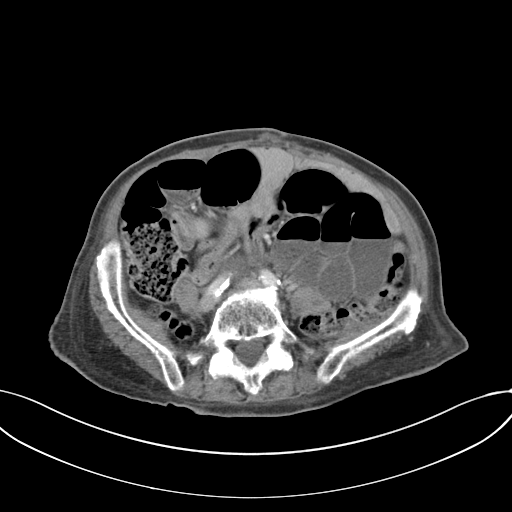
[im 41/82  soft-tissue]
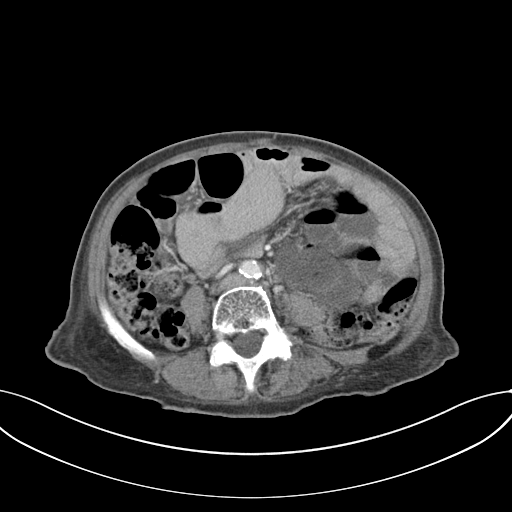
[im 46/82  soft-tissue]
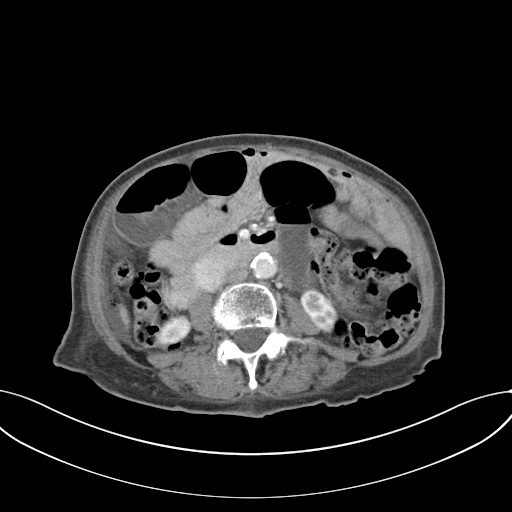
[im 51/82  soft-tissue]
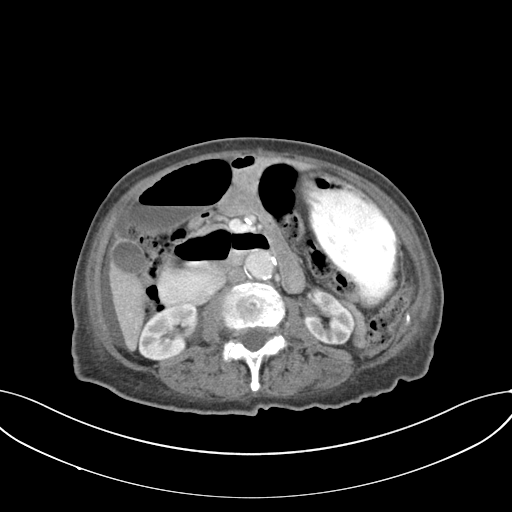
[im 51/82  bone]
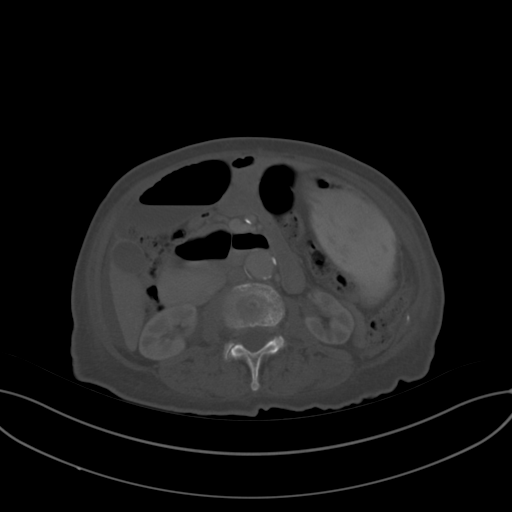
[im 56/82  soft-tissue]
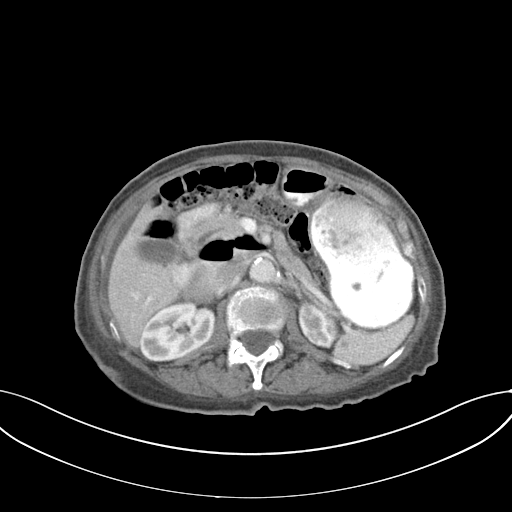
[im 66/82  soft-tissue]
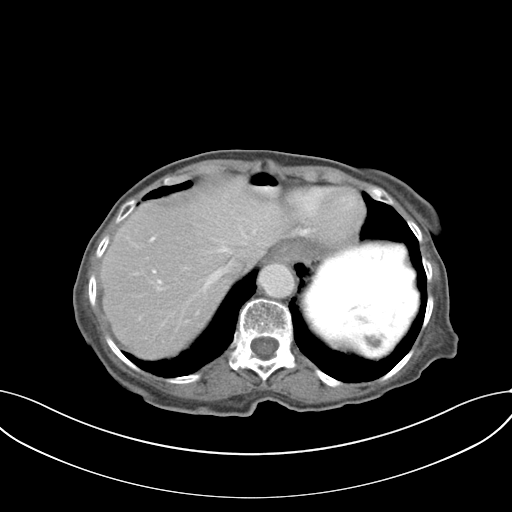
[im 71/82  soft-tissue]
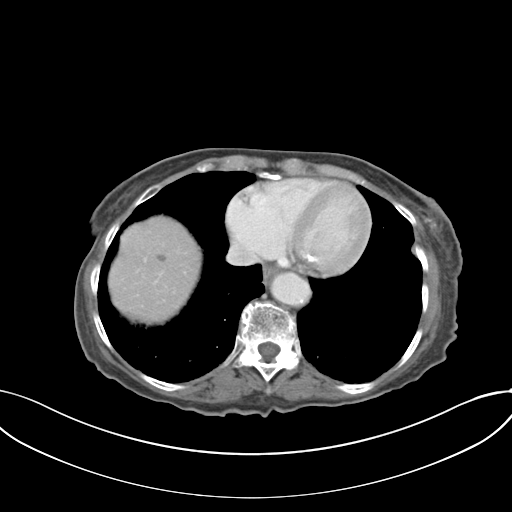
[im 76/82  soft-tissue]
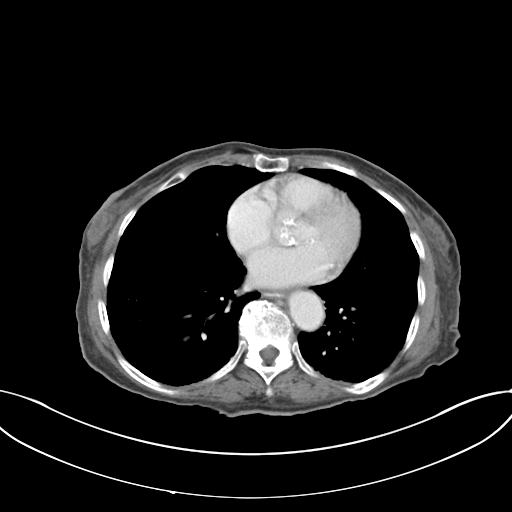

[Series 3: coronal a/|p · coronal · 0.83mm/px · 3 of 85 slices shown]
[im 29/85  soft-tissue]
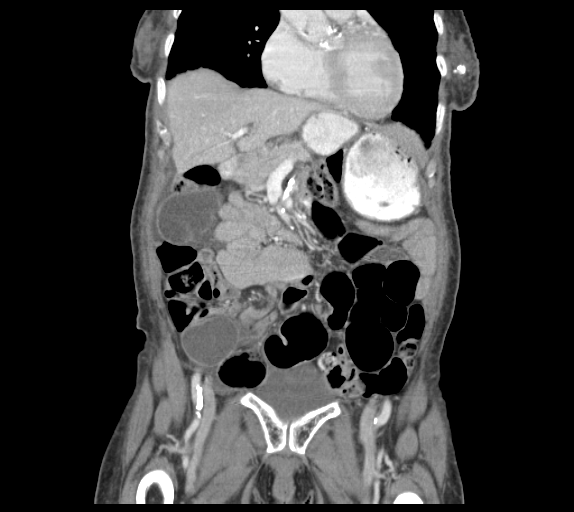
[im 38/85  soft-tissue]
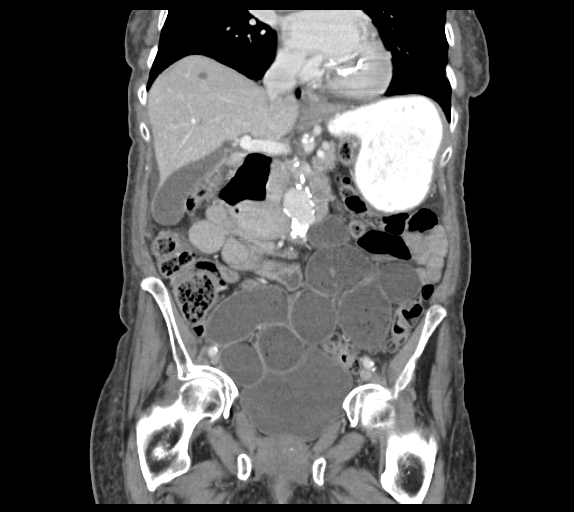
[im 47/85  soft-tissue]
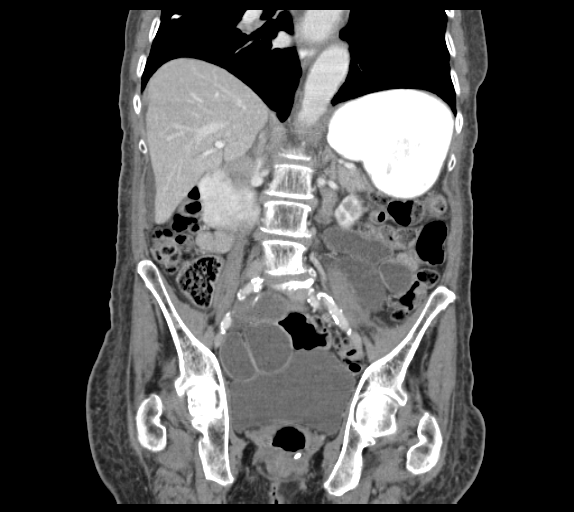

[16 of 46 positions shown; findings below may reference images not displayed]

FINDINGS: Lower chest: No pleural effusion identified. There are coarsened
interstitial markings identified within both lung bases. Calcified
granuloma identified within the right middle lobe. There is also
calcified right hilar node.

Hepatobiliary: No suspicious liver abnormality identified. Stable
cyst within the dome of liver measure 7 mm, image 12/series 2. The
gallbladder appears normal. No biliary dilatation.

Pancreas: Normal appearance of the pancreas.

Spleen: The spleen is unremarkable.

Adrenals/Urinary Tract: Normal appearance of the adrenal glands.
Bilateral renal cortical volume loss identified. Cyst within the mid
left kidney measures 1.2 cm, image 15/series 7. There is moderate
distension of the urinary bladder.

Stomach/Bowel: Moderate distension of the stomach. Small bowel loops
are increased in caliber and there are multiple fluid levels. The
small bowel loops measure up to 4.2 cm, image 52/series 2.
Transition to decreased caliber distal small bowel loops identified
in the right abdomen, image 25/series 3. No evidence for perforation
or abscess. Normal caliber large bowel loops. Numerous colonic
diverticula noted without acute inflammation.

Vascular/Lymphatic: Aortic atherosclerosis identified. The
infrarenal abdominal aorta measures 2.8 cm, image 33/series 2.

Reproductive: Partially calcified an atrophic uterus noted.

Other: Small amount of perihepatic ascites noted.

Musculoskeletal: The bones are appear diffusely osteopenic. Multi
level degenerative disc disease identified.
IMPRESSION: 1. Findings consistent with small bowel obstruction. Transition to
relative normal caliber distal small bowel loops noted in the right
abdomen.
2. Aortic atherosclerosis.
3. Prior granulomatous disease.
4. Ectatic abdominal aorta.
5.

## 2016-03-23 IMAGING — CR DG ABDOMEN 2V
1 series · 1 of 1 positions shown · non-contrast
Comparison: 08/06/2015

CLINICAL DATA: Abdominal distention and nausea

EXAM:
ABDOMEN - 2 VIEW

[w abdomen decub]
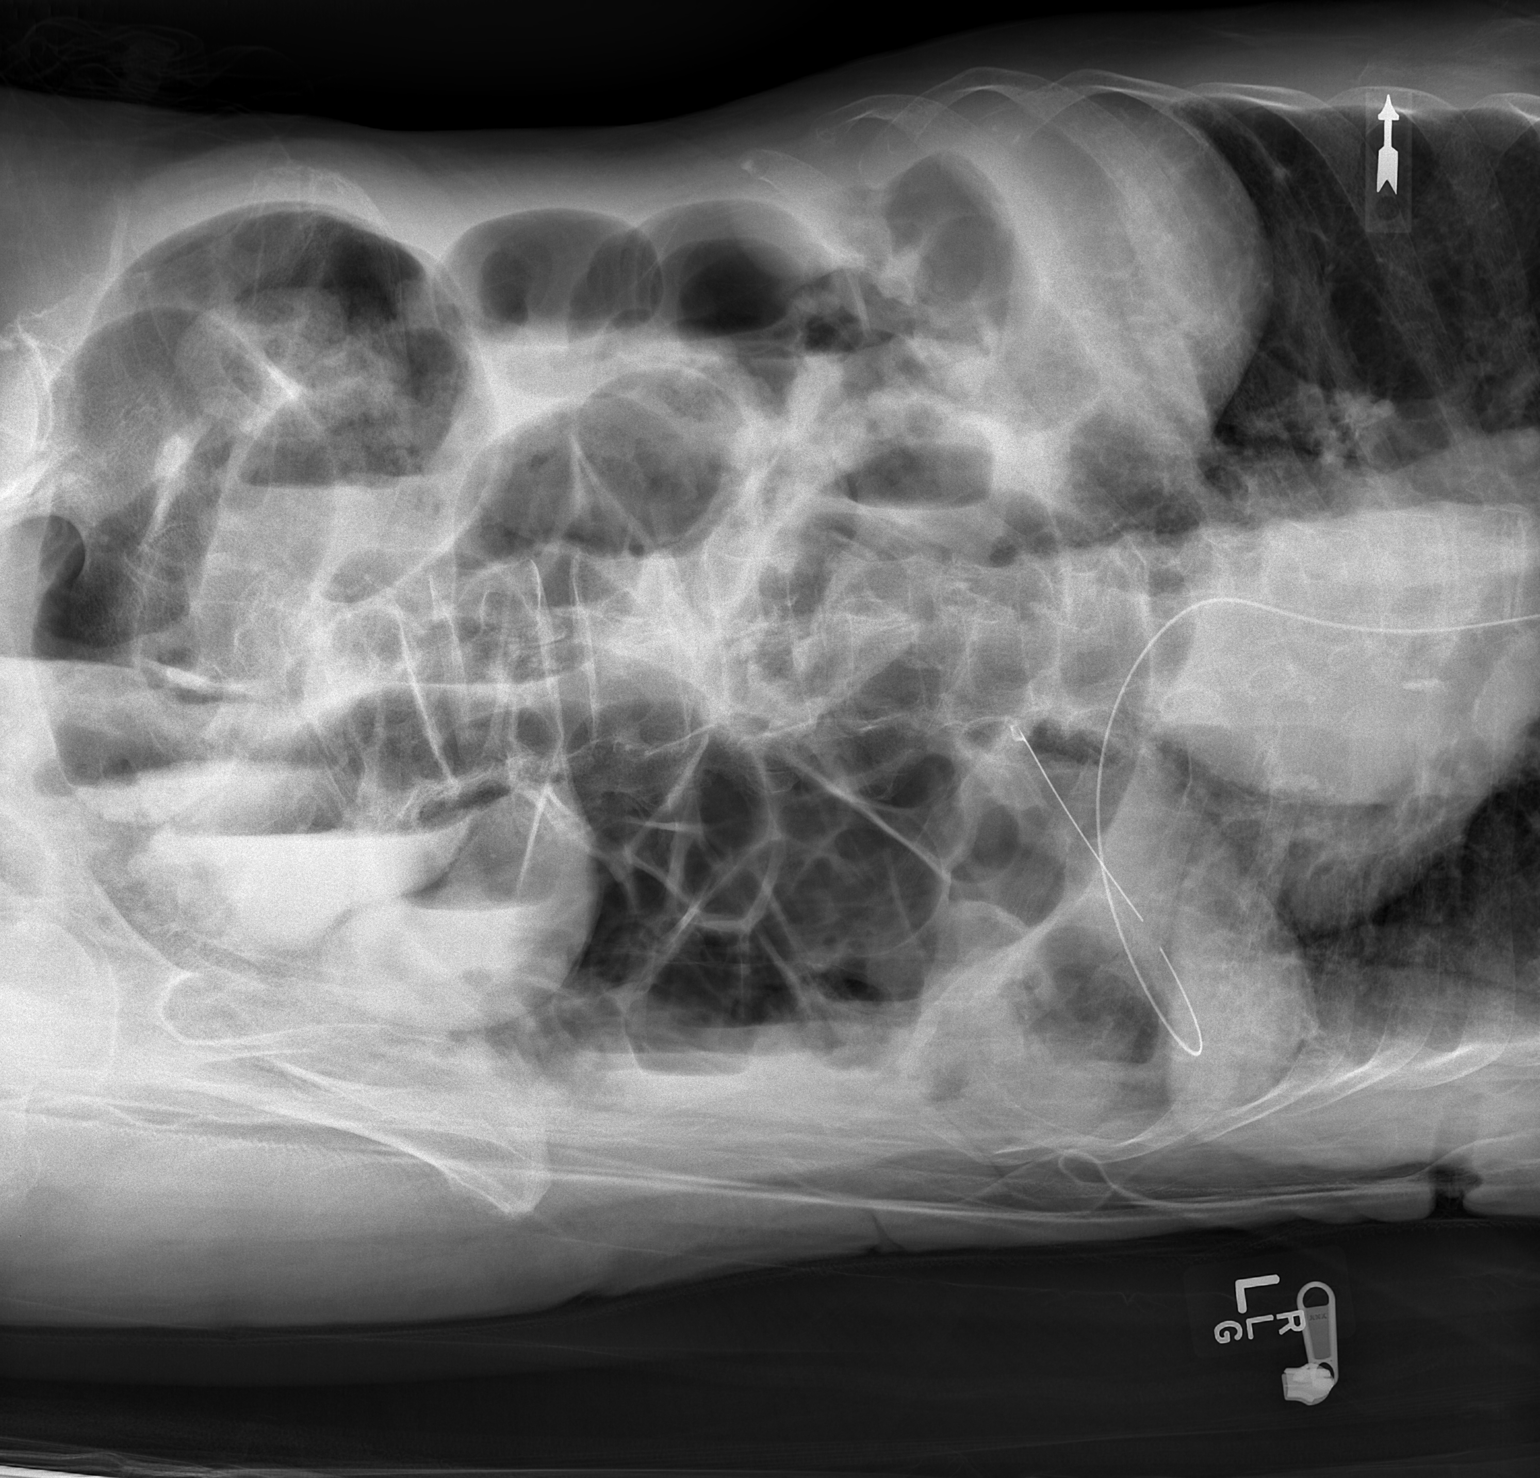

[1 of 1 positions shown; findings below may reference images not displayed]

FINDINGS: A nasogastric catheter is again noted coiled within the stomach.
Scattered large and small bowel gas is noted. Mild persistent
dilatation of the small bowel is seen stable from the previous exam.
Small amount of colonic air is noted consistent with a partial small
bowel obstruction. No free air is seen. No abnormal mass lesion is
noted. Contrast material from prior CT is noted within the colon.
IMPRESSION: Persistent small bowel dilatation consistent with a partial small
bowel obstruction. No significant change from the previous day is
noted.

## 2016-03-25 IMAGING — CR DG ABDOMEN 1V
1 series · 1 of 1 positions shown · non-contrast
Comparison: 08/07/2015

CLINICAL DATA: Small bowel obstruction, abdominal distension

EXAM:
ABDOMEN - 1 VIEW

[ap (kub)]
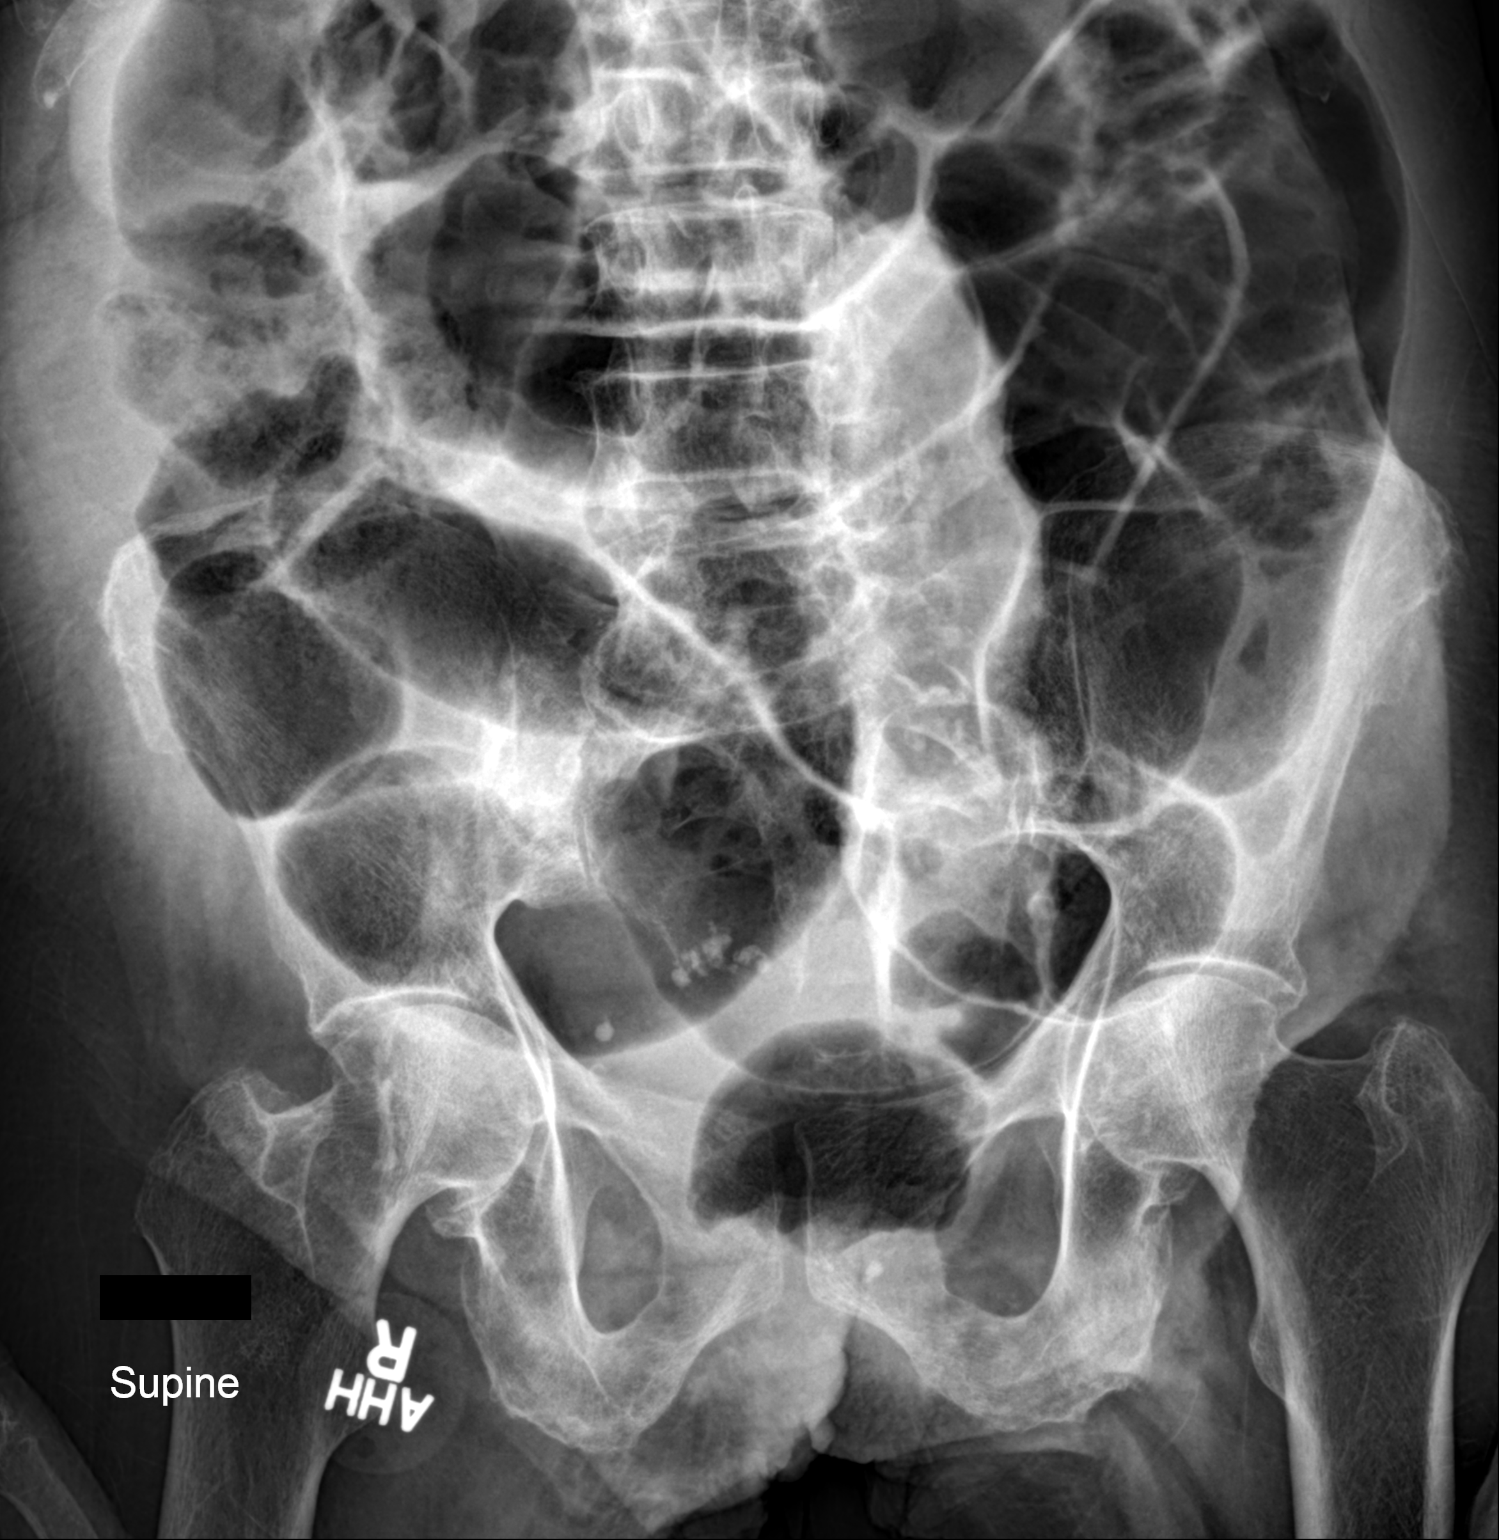

[1 of 1 positions shown; findings below may reference images not displayed]

FINDINGS: Upper abdomen is excluded from the field of view. Numerous
gas-filled dilated loops of small bowel which appears similar to the
prior exam. There is no bowel dilatation to suggest obstruction.
There is no evidence of pneumoperitoneum, portal venous gas or
pneumatosis. There are no pathologic calcifications along the
expected course of the ureters.The osseous structures are
unremarkable.
IMPRESSION: Persistent numerous gas-filled dilated loops of small bowel similar
to the prior exam consistent with small-bowel obstruction.

## 2016-03-26 IMAGING — CR DG ABDOMEN 2V
1 series · 1 of 1 positions shown · non-contrast
Comparison: 08/09/2015

CLINICAL DATA: Abdominal distension

EXAM:
ABDOMEN - 2 VIEW

[view not recorded]
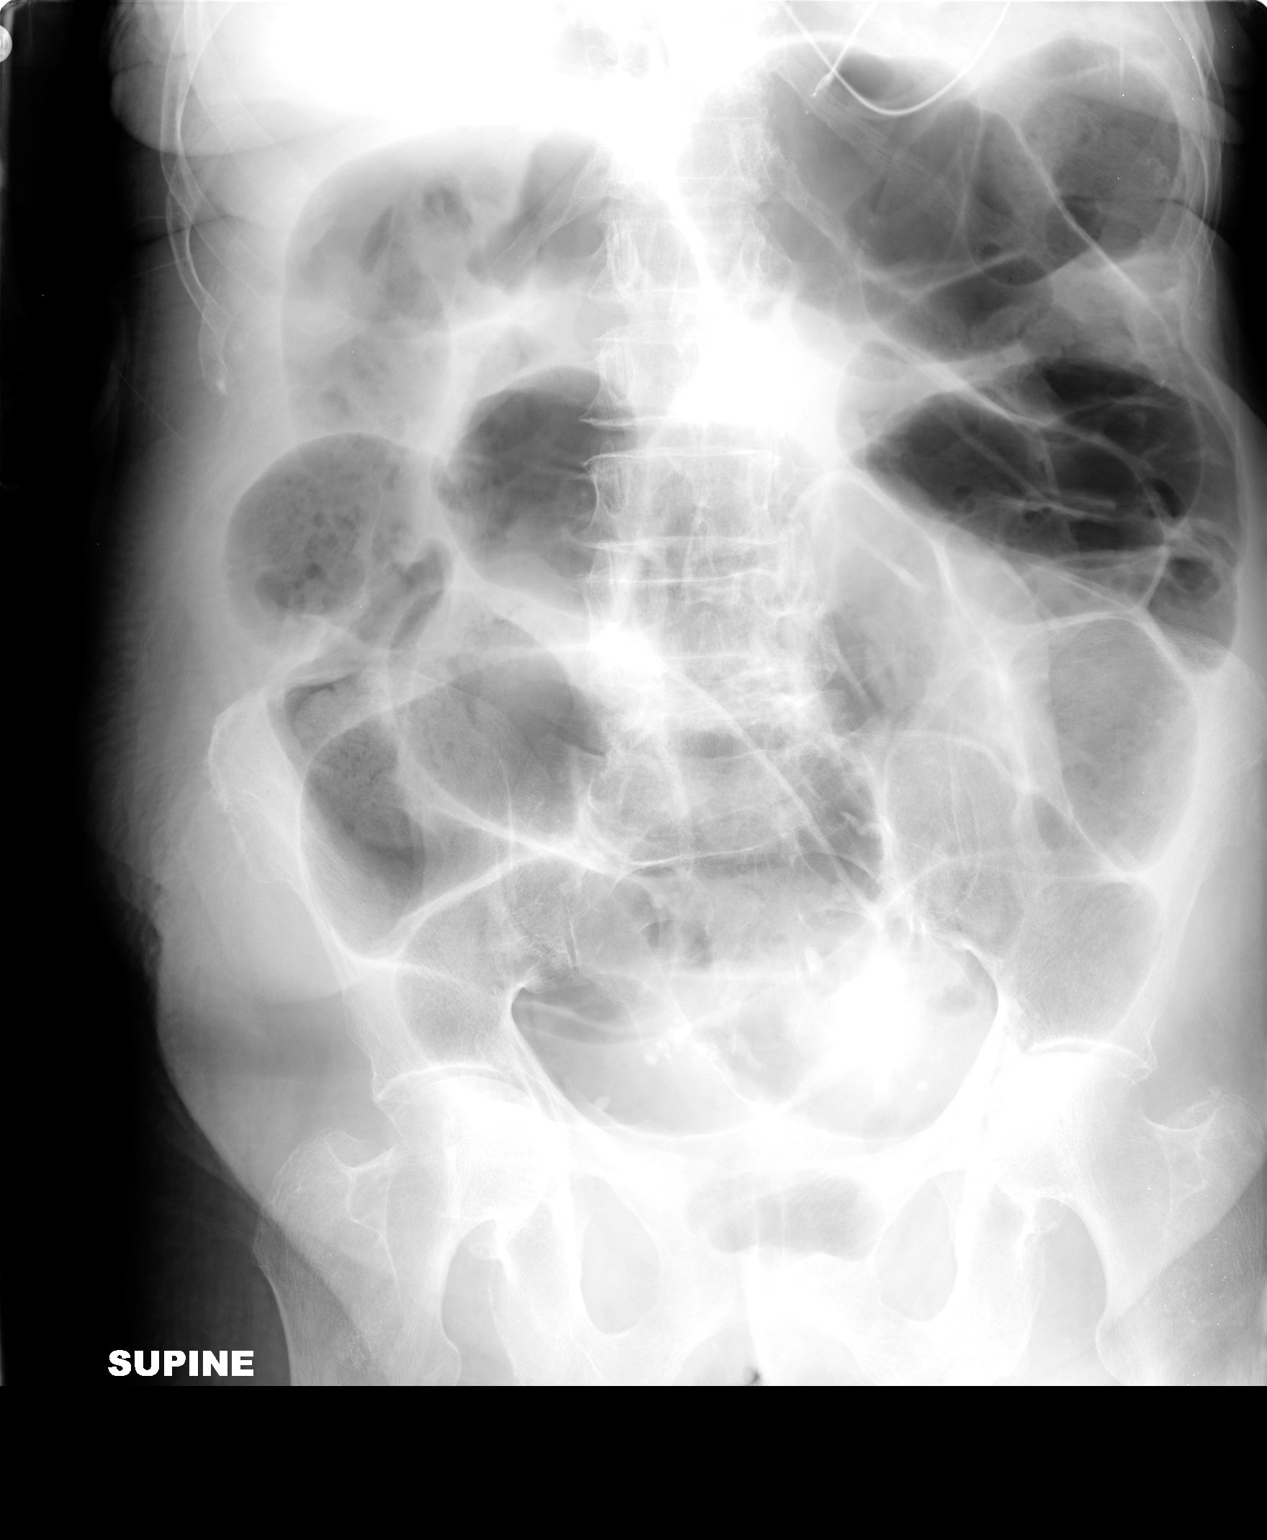

[1 of 1 positions shown; findings below may reference images not displayed]

FINDINGS: Persistent significant gaseous distended small bowel loops
consistent with small bowel obstruction. NG tube coiled within
stomach with tip in proximal stomach. No free abdominal air.
IMPRESSION: Persistent gaseous distended small bowel loops consistent with small
bowel obstruction.

## 2016-03-30 ENCOUNTER — Encounter: Payer: Self-pay | Admitting: Vascular Surgery

## 2016-04-01 ENCOUNTER — Other Ambulatory Visit: Payer: Self-pay | Admitting: Endocrinology

## 2016-04-06 ENCOUNTER — Ambulatory Visit (HOSPITAL_COMMUNITY)
Admission: RE | Admit: 2016-04-06 | Discharge: 2016-04-06 | Disposition: A | Discharge status: Home / Self Care | Payer: Medicare Other | Source: Ambulatory Visit | Attending: Vascular Surgery | Admitting: Vascular Surgery

## 2016-04-06 ENCOUNTER — Ambulatory Visit (INDEPENDENT_AMBULATORY_CARE_PROVIDER_SITE_OTHER): Payer: Medicare Other | Admitting: Vascular Surgery

## 2016-04-06 ENCOUNTER — Encounter: Payer: Self-pay | Admitting: Vascular Surgery

## 2016-04-06 VITALS — BP 109/59 | HR 71 | Temp 99.1°F | Ht 60.0 in | Wt 97.6 lb

## 2016-04-06 DIAGNOSIS — I5032 Chronic diastolic (congestive) heart failure: Secondary | ICD-10-CM | POA: Diagnosis not present

## 2016-04-06 DIAGNOSIS — I7092 Chronic total occlusion of artery of the extremities: Secondary | ICD-10-CM

## 2016-04-06 DIAGNOSIS — I110 Hypertensive heart disease with heart failure: Secondary | ICD-10-CM | POA: Insufficient documentation

## 2016-04-06 DIAGNOSIS — I739 Peripheral vascular disease, unspecified: Secondary | ICD-10-CM | POA: Diagnosis not present

## 2016-04-06 DIAGNOSIS — R938 Abnormal findings on diagnostic imaging of other specified body structures: Secondary | ICD-10-CM | POA: Diagnosis not present

## 2016-04-06 DIAGNOSIS — R0989 Other specified symptoms and signs involving the circulatory and respiratory systems: Secondary | ICD-10-CM | POA: Diagnosis present

## 2016-04-06 NOTE — Progress Notes (Signed)
Subjective:    Patient ID: Desiree Hutchinson, female DOB: 08/22/1920, 82 y.o.   MRN: [id number]   HPI:  this 82 year old female was referred by Dr. Dagoberto ReefPetrey  For evaluation of circulation. Patient apparently had a problem with the toenail on the left first toe a few months ago and a portion of the nail was removed and the patient has had very slow healing of this area. There's been no infection drainage or gangrene. Patient is able to ambulate with a walker. She's had no previous problems with the lower extremities. She denies diabetes mellitus , coronary artery disease, or stroke.  Past Medical History  Diagnosis Date  . History of atherosclerotic cardiovascular disease   . Hypertension   . Left ventricular hypertrophy   . Gastritis   . Cough   . Personal history of tobacco use, presenting hazards to health   . Obesity   . Chest pain, non-cardiac   . Allergic rhinitis   . COPD (chronic obstructive pulmonary disease) (HCC)   . History of small bowel obstruction   . Chronic diastolic heart failure (HCC)   . Aortic valve disease     regurge > stenosis  . Anemia     Social History  Substance Use Topics  . Smoking status: Former Smoker -- 0.10 packs/day for 30 years    Types: Cigarettes    Quit date: 01/24/2010  . Smokeless tobacco: Never Used     Comment: smoked 1-2 cigarettes/day  . Alcohol Use: No    No family history on file.  No Known Allergies   Current outpatient prescriptions:  .  amLODipine (NORVASC) 5 MG tablet, Take 5 mg by mouth daily., Disp: , Rfl: 5 .  aspirin 81 MG tablet, Take 81 mg by mouth daily., Disp: , Rfl:  .  ferrous sulfate 325 (65 FE) MG tablet, TAKE 1 TABLET (325 MG TOTAL) BY MOUTH DAILY WITH BREAKFAST., Disp: 30 tablet, Rfl: 0 .  hydrALAZINE (APRESOLINE) 25 MG tablet, Take 1 tablet by mouth 2 (two) times daily. , Disp: , Rfl:  .  HYDROcodone-acetaminophen (NORCO/VICODIN) 5-325 MG per tablet, Take 1 tablet by mouth every 4 (four) hours as needed for  moderate pain or severe pain., Disp: 30 tablet, Rfl: 0 .  lisinopril (PRINIVIL,ZESTRIL) 2.5 MG tablet, TAKE 1 TABLET (2.5 MG TOTAL) BY MOUTH DAILY., Disp: 90 tablet, Rfl: 3 .  Tiotropium Bromide Monohydrate (SPIRIVA RESPIMAT) 2.5 MCG/ACT AERS, Inhale 2 puffs into the lungs daily., Disp: 4 g, Rfl: 11 .  acetaminophen (TYLENOL) 325 MG tablet, Take 1-2 tablets (325-650 mg total) by mouth every 6 (six) hours as needed for fever, headache, mild pain or moderate pain. (Patient not taking: Reported on 04/06/2016), Disp: , Rfl:  .  carvedilol (COREG) 3.125 MG tablet, Take 1 tablet (3.125 mg total) by mouth 2 (two) times daily with a meal. (Patient not taking: Reported on 04/06/2016), Disp: 60 tablet, Rfl: 1 .  feeding supplement (BOOST / RESOURCE BREEZE) LIQD, Take 1 Container by mouth 3 (three) times daily between meals. (Patient not taking: Reported on 04/06/2016), Disp: 90 Container, Rfl: 11 .  Incontinence Supply Disposable (REALITY INCONTINENT BRIEFS SM) MISC, 10 per day  R32 (Patient not taking: Reported on 04/06/2016), Disp: 300 each, Rfl: 11 .  polyethylene glycol powder (GLYCOLAX/MIRALAX) powder, Take 17 g by mouth daily. (Patient not taking: Reported on 04/06/2016), Disp: 850 g, Rfl: 0 .  simethicone (MYLICON) 80 MG chewable tablet, Chew 80 mg by mouth every 6 (six) hours as needed  for flatulence. Reported on 04/06/2016, Disp: , Rfl:  .  sodium chloride (OCEAN) 0.65 % SOLN nasal spray, Place 1 spray into both nostrils as needed for congestion. Reported on 04/06/2016, Disp: , Rfl:    ROS:   denies chest pain, dyspnea on exertion, PND, orthopnea, hemoptysis. Did have surgery for intestinal obstruction recently and has done well from that standpoint  Objective:  Physical Exam: BP 109/59 mmHg  Pulse 71  Temp(Src) 99.1 F (37.3 C) (Oral)  Ht 5' (1.524 m)  Wt 97 lb 9.6 oz (44.271 kg)  BMI 19.06 kg/m2  SpO2 99%  Gen.-alert and oriented x3 in no apparent distress HEENT normal for age Lungs no rhonchi  or wheezing Cardiovascular regular rhythm no murmurs carotid pulses 3+ palpable no bruits audible Abdomen soft nontender no palpable masses Musculoskeletal free of  major deformities Skin clear -no rashes Neurologic normal Lower extremities 3+ femoral and 2+ dorsalis pedis pulse palpable on the right 3+ femoral and 2+ posterior tibial pulse palpable on the left Dry eschar at the very tip of the nail bed the left first toe with no cellulitis purulence or fluctuance. Both feet well perfused.  Today I ordered ABIs which I reviewed and interpreted. Right foot has 0.98 in the posterior tibial with triphasic flow Left leg has 0.80 in the posterior tibial with biphasic flow      Assessment:      slowly healing left first toe distal nail bed where toenail was removed 2 months ago   mild diffuse lower extremity occlusive disease with satisfactory circulation distally to heal this area    Plan:      patient should keep left first toenail padded carefully so that it is not further traumatized and would not perform any surgical procedure on this but allow it to heal spontaneously over time We'll see patient back on when necessary basis

## 2016-04-07 DIAGNOSIS — T8189XD Other complications of procedures, not elsewhere classified, subsequent encounter: Secondary | ICD-10-CM | POA: Diagnosis not present

## 2016-04-24 ENCOUNTER — Other Ambulatory Visit: Payer: Self-pay | Admitting: Cardiovascular Disease

## 2016-04-28 ENCOUNTER — Other Ambulatory Visit: Payer: Self-pay | Admitting: Endocrinology

## 2016-05-03 ENCOUNTER — Ambulatory Visit (INDEPENDENT_AMBULATORY_CARE_PROVIDER_SITE_OTHER): Payer: Medicare Other | Admitting: Pulmonary Disease

## 2016-05-03 ENCOUNTER — Encounter: Payer: Self-pay | Admitting: Pulmonary Disease

## 2016-05-03 VITALS — BP 144/78 | HR 77 | Ht 60.0 in | Wt 99.0 lb

## 2016-05-03 DIAGNOSIS — J439 Emphysema, unspecified: Secondary | ICD-10-CM | POA: Diagnosis not present

## 2016-05-03 DIAGNOSIS — I7092 Chronic total occlusion of artery of the extremities: Secondary | ICD-10-CM

## 2016-05-03 NOTE — Patient Instructions (Signed)
Continue using the Spiriva as prescribed.  Return to clinic in 6 months. Please call us sooner if there is any worsening dyspnea.

## 2016-05-03 NOTE — Progress Notes (Signed)
Subjective:    Patient ID: Desiree Hutchinson, female    DOB: 1920/11/11, 82 y.o.   MRN: [id number]  HPI COPD Gold class I  [name] is a 82 year old with mild emphysema. Gold class I. She is a former patient of Dr. Delford FieldWright. She's been maintained on Spiriva with improvement in symptoms. She does not have any recent exacerbations or ER visits. She is hospitalized in July 2016 with an acute diverticular bleed. She was transfused but did not end up getting a colonoscopy. Her bleeding resolved spontaneously. She was also hospitalized in August 2016 with a small bowel obstruction. She this was initially managed conservatively however she had to undergo surgery with lysis of adhesions. She did not have any issues with her breathing during these 2 hospitalizations.  DATA: Spirometry 01/08/14 FVC 2.08 [119%] FEV1 1.06 [89%] F/F 51 Mild   Chest x-ray 08/06/15 Mild bibasilar atelectasis, mild vascular congestion.  Social History:  She is a former smoker. She quit in 2011. No alcohol or drug use.                                                                                                                                                                                                                                                         Family History: Non contributory  Past Medical History  Diagnosis Date  . History of atherosclerotic cardiovascular disease   . Hypertension   . Left ventricular hypertrophy   . Gastritis   . Cough   . Personal history of tobacco use, presenting hazards to health   . Obesity   . Chest pain, non-cardiac   . Allergic rhinitis   . COPD (chronic obstructive pulmonary disease) (HCC)   . History of small bowel obstruction   . Chronic diastolic heart failure (HCC)   . Aortic valve disease     regurge > stenosis  . Anemia     Current outpatient prescriptions:  .  acetaminophen (TYLENOL) 325 MG tablet, Take 1-2 tablets (325-650 mg total) by mouth every 6  (six) hours as needed for fever, headache, mild pain or moderate pain., Disp: , Rfl:  .  amLODipine (NORVASC) 5 MG tablet, Take 5 mg by mouth daily., Disp: , Rfl: 5 .  aspirin 81 MG tablet, Take 81 mg by mouth daily., Disp: ,  Rfl:  .  carvedilol (COREG) 3.125 MG tablet, Take 1 tablet (3.125 mg total) by mouth 2 (two) times daily with a meal., Disp: 60 tablet, Rfl: 1 .  feeding supplement (BOOST / RESOURCE BREEZE) LIQD, Take 1 Container by mouth 3 (three) times daily between meals., Disp: 90 Container, Rfl: 11 .  ferrous sulfate 325 (65 FE) MG tablet, TAKE 1 TABLET (325 MG TOTAL) BY MOUTH DAILY WITH BREAKFAST., Disp: 30 tablet, Rfl: 0 .  hydrALAZINE (APRESOLINE) 25 MG tablet, Take 1 tablet by mouth 2 (two) times daily. , Disp: , Rfl:  .  HYDROcodone-acetaminophen (NORCO/VICODIN) 5-325 MG per tablet, Take 1 tablet by mouth every 4 (four) hours as needed for moderate pain or severe pain., Disp: 30 tablet, Rfl: 0 .  Incontinence Supply Disposable (REALITY INCONTINENT BRIEFS SM) MISC, 10 per day  R32, Disp: 300 each, Rfl: 11 .  lisinopril (PRINIVIL,ZESTRIL) 2.5 MG tablet, TAKE 1 TABLET (2.5 MG TOTAL) BY MOUTH DAILY., Disp: 90 tablet, Rfl: 3 .  polyethylene glycol powder (GLYCOLAX/MIRALAX) powder, Take 17 g by mouth daily., Disp: 850 g, Rfl: 0 .  simethicone (MYLICON) 80 MG chewable tablet, Chew 80 mg by mouth every 6 (six) hours as needed for flatulence. Reported on 04/06/2016, Disp: , Rfl:  .  sodium chloride (OCEAN) 0.65 % SOLN nasal spray, Place 1 spray into both nostrils as needed for congestion. Reported on 04/06/2016, Disp: , Rfl:  .  Tiotropium Bromide Monohydrate (SPIRIVA RESPIMAT) 2.5 MCG/ACT AERS, Inhale 2 puffs into the lungs daily., Disp: 4 g, Rfl: 11  Review of Systems Has dyspnea on exertion at baseline. Denies any cough, sputum production, fevers, chills, hemoptysis. Denies any chest pain, palpitations. Denies any abdominal pain, nausea, vomiting, diarrhea. Denies any fevers, chills, loss  of weight, loss of appetite. All other review of systems are negative    Objective:   Physical Exam Blood pressure 144/78, pulse 77, height 5' (1.524 m), weight 99 lb (44.906 kg), SpO2 100 %. Gen.: Pleasant elderly female. No apparent distress Neuro: No gross focal deficits. Neck: No JVD, lymphadenopathy, thyromegaly. RS: Clear, no wheeze, crackles, non-labored breathing. CVS: S1-S2 heard, no murmurs rubs gallops. Abdomen: Soft, positive bowel sounds. Extremities: No edema.    Assessment & Plan:  COPD Gold class I Stable on Spiriva. No exacerbations or ER visits for respiratory problems. She'll continue using the same as directed.  Return to clinic in 6 months.  Chilton Greathouse MD [facility] Pager [phone] If no answer or after 3pm call: [phone] 05/03/2016, 4:28 PM

## 2016-05-08 ENCOUNTER — Other Ambulatory Visit: Payer: Self-pay | Admitting: Cardiovascular Disease

## 2016-06-04 ENCOUNTER — Other Ambulatory Visit: Payer: Self-pay | Admitting: Endocrinology

## 2016-07-02 NOTE — Telephone Encounter (Signed)
This encounter was created in error - please disregard.

## 2016-07-04 ENCOUNTER — Other Ambulatory Visit: Payer: Self-pay | Admitting: Endocrinology

## 2016-07-11 ENCOUNTER — Other Ambulatory Visit: Payer: Self-pay | Admitting: Critical Care Medicine

## 2016-09-12 ENCOUNTER — Encounter (HOSPITAL_COMMUNITY): Payer: Self-pay

## 2016-09-12 ENCOUNTER — Inpatient Hospital Stay (HOSPITAL_COMMUNITY): Payer: Medicare Other

## 2016-09-12 ENCOUNTER — Emergency Department (HOSPITAL_COMMUNITY): Payer: Medicare Other

## 2016-09-12 ENCOUNTER — Inpatient Hospital Stay (HOSPITAL_COMMUNITY)
Admission: EM | Admit: 2016-09-12 | Discharge: 2016-09-15 | DRG: 356 | Disposition: A | Discharge status: Home Health | Payer: Medicare Other | Attending: Internal Medicine | Admitting: Internal Medicine

## 2016-09-12 DIAGNOSIS — D72819 Decreased white blood cell count, unspecified: Secondary | ICD-10-CM | POA: Diagnosis not present

## 2016-09-12 DIAGNOSIS — I9589 Other hypotension: Secondary | ICD-10-CM | POA: Diagnosis not present

## 2016-09-12 DIAGNOSIS — K5731 Diverticulosis of large intestine without perforation or abscess with bleeding: Principal | ICD-10-CM | POA: Diagnosis present

## 2016-09-12 DIAGNOSIS — Z87891 Personal history of nicotine dependence: Secondary | ICD-10-CM | POA: Diagnosis not present

## 2016-09-12 DIAGNOSIS — D500 Iron deficiency anemia secondary to blood loss (chronic): Secondary | ICD-10-CM

## 2016-09-12 DIAGNOSIS — I359 Nonrheumatic aortic valve disorder, unspecified: Secondary | ICD-10-CM | POA: Diagnosis present

## 2016-09-12 DIAGNOSIS — M25519 Pain in unspecified shoulder: Secondary | ICD-10-CM | POA: Diagnosis not present

## 2016-09-12 DIAGNOSIS — N183 Chronic kidney disease, stage 3 (moderate): Secondary | ICD-10-CM | POA: Diagnosis present

## 2016-09-12 DIAGNOSIS — J449 Chronic obstructive pulmonary disease, unspecified: Secondary | ICD-10-CM | POA: Diagnosis present

## 2016-09-12 DIAGNOSIS — Z66 Do not resuscitate: Secondary | ICD-10-CM | POA: Diagnosis present

## 2016-09-12 DIAGNOSIS — K625 Hemorrhage of anus and rectum: Secondary | ICD-10-CM

## 2016-09-12 DIAGNOSIS — D6959 Other secondary thrombocytopenia: Secondary | ICD-10-CM | POA: Diagnosis present

## 2016-09-12 DIAGNOSIS — I350 Nonrheumatic aortic (valve) stenosis: Secondary | ICD-10-CM

## 2016-09-12 DIAGNOSIS — K922 Gastrointestinal hemorrhage, unspecified: Secondary | ICD-10-CM | POA: Diagnosis not present

## 2016-09-12 DIAGNOSIS — R579 Shock, unspecified: Secondary | ICD-10-CM

## 2016-09-12 DIAGNOSIS — I130 Hypertensive heart and chronic kidney disease with heart failure and stage 1 through stage 4 chronic kidney disease, or unspecified chronic kidney disease: Secondary | ICD-10-CM | POA: Diagnosis present

## 2016-09-12 DIAGNOSIS — D62 Acute posthemorrhagic anemia: Secondary | ICD-10-CM | POA: Diagnosis not present

## 2016-09-12 DIAGNOSIS — R578 Other shock: Secondary | ICD-10-CM

## 2016-09-12 DIAGNOSIS — D72829 Elevated white blood cell count, unspecified: Secondary | ICD-10-CM | POA: Diagnosis present

## 2016-09-12 DIAGNOSIS — Z7982 Long term (current) use of aspirin: Secondary | ICD-10-CM | POA: Diagnosis not present

## 2016-09-12 DIAGNOSIS — D696 Thrombocytopenia, unspecified: Secondary | ICD-10-CM

## 2016-09-12 DIAGNOSIS — R0602 Shortness of breath: Secondary | ICD-10-CM | POA: Diagnosis not present

## 2016-09-12 DIAGNOSIS — K921 Melena: Secondary | ICD-10-CM | POA: Diagnosis not present

## 2016-09-12 DIAGNOSIS — Z79899 Other long term (current) drug therapy: Secondary | ICD-10-CM

## 2016-09-12 DIAGNOSIS — I2510 Atherosclerotic heart disease of native coronary artery without angina pectoris: Secondary | ICD-10-CM | POA: Diagnosis present

## 2016-09-12 DIAGNOSIS — I5032 Chronic diastolic (congestive) heart failure: Secondary | ICD-10-CM | POA: Diagnosis present

## 2016-09-12 DIAGNOSIS — I248 Other forms of acute ischemic heart disease: Secondary | ICD-10-CM | POA: Diagnosis present

## 2016-09-12 DIAGNOSIS — M6281 Muscle weakness (generalized): Secondary | ICD-10-CM

## 2016-09-12 DIAGNOSIS — I959 Hypotension, unspecified: Secondary | ICD-10-CM | POA: Diagnosis present

## 2016-09-12 HISTORY — PX: IR GENERIC HISTORICAL: IMG1180011

## 2016-09-12 LAB — COMPREHENSIVE METABOLIC PANEL
ALT: 9 U/L — ABNORMAL LOW (ref 14–54)
ALT: 12 U/L — ABNORMAL LOW (ref 14–54)
AST: 21 U/L (ref 15–41)
AST: 22 U/L (ref 15–41)
Albumin: 3.6 g/dL (ref 3.5–5.0)
Albumin: 2 g/dL — ABNORMAL LOW (ref 3.5–5.0)
Alkaline Phosphatase: 85 U/L (ref 38–126)
Alkaline Phosphatase: 42 U/L (ref 38–126)
Anion gap: 6 (ref 5–15)
Anion gap: 7 (ref 5–15)
BUN: 34 mg/dL — ABNORMAL HIGH (ref 6–20)
BUN: 26 mg/dL — ABNORMAL HIGH (ref 6–20)
CO2: 20 mmol/L — ABNORMAL LOW (ref 22–32)
CO2: 10 mmol/L — ABNORMAL LOW (ref 22–32)
Calcium: 9 mg/dL (ref 8.9–10.3)
Calcium: 6.5 mg/dL — ABNORMAL LOW (ref 8.9–10.3)
Chloride: 112 mmol/L — ABNORMAL HIGH (ref 101–111)
Chloride: 127 mmol/L — ABNORMAL HIGH (ref 101–111)
Creatinine, Ser: 1.24 mg/dL — ABNORMAL HIGH (ref 0.44–1.00)
Creatinine, Ser: 1.17 mg/dL — ABNORMAL HIGH (ref 0.44–1.00)
GFR calc Af Amer: 42 mL/min — ABNORMAL LOW (ref >60)
GFR calc Af Amer: 45 mL/min — ABNORMAL LOW (ref >60)
GFR calc non Af Amer: 36 mL/min — ABNORMAL LOW (ref >60)
GFR calc non Af Amer: 39 mL/min — ABNORMAL LOW (ref >60)
Glucose, Bld: 197 mg/dL — ABNORMAL HIGH (ref 65–99)
Glucose, Bld: 182 mg/dL — ABNORMAL HIGH (ref 65–99)
Potassium: 4.3 mmol/L (ref 3.5–5.1)
Potassium: 4.1 mmol/L (ref 3.5–5.1)
Sodium: 138 mmol/L (ref 135–145)
Sodium: 144 mmol/L (ref 135–145)
Total Bilirubin: 0.3 mg/dL (ref 0.3–1.2)
Total Bilirubin: 0.5 mg/dL (ref 0.3–1.2)
Total Protein: 6 g/dL — ABNORMAL LOW (ref 6.5–8.1)
Total Protein: 3.5 g/dL — ABNORMAL LOW (ref 6.5–8.1)

## 2016-09-12 LAB — CBC WITH DIFFERENTIAL/PLATELET
Basophils Absolute: 0 10*3/uL (ref 0.0–0.1)
Basophils Relative: 0 %
Eosinophils Absolute: 0 10*3/uL (ref 0.0–0.7)
Eosinophils Relative: 0 %
HCT: 27.7 % — ABNORMAL LOW (ref 36.0–46.0)
Hemoglobin: 9 g/dL — ABNORMAL LOW (ref 12.0–15.0)
Lymphocytes Relative: 27 %
Lymphs Abs: 2.2 10*3/uL (ref 0.7–4.0)
MCH: 30.9 pg (ref 26.0–34.0)
MCHC: 32.5 g/dL (ref 30.0–36.0)
MCV: 95.2 fL (ref 78.0–100.0)
Monocytes Absolute: 0.3 10*3/uL (ref 0.1–1.0)
Monocytes Relative: 4 %
Neutro Abs: 5.7 10*3/uL (ref 1.7–7.7)
Neutrophils Relative %: 69 %
Platelets: 157 10*3/uL (ref 150–400)
RBC: 2.91 MIL/uL — ABNORMAL LOW (ref 3.87–5.11)
RDW: 15 % (ref 11.5–15.5)
WBC: 8.3 10*3/uL (ref 4.0–10.5)

## 2016-09-12 LAB — ECHOCARDIOGRAM COMPLETE
Height: 60 in
Weight: 1555.57 oz

## 2016-09-12 LAB — I-STAT CHEM 8, ED
BUN: 35 mg/dL — ABNORMAL HIGH (ref 6–20)
Calcium, Ion: 1.1 mmol/L — ABNORMAL LOW (ref 1.15–1.40)
Chloride: 111 mmol/L (ref 101–111)
Creatinine, Ser: 1.3 mg/dL — ABNORMAL HIGH (ref 0.44–1.00)
Glucose, Bld: 191 mg/dL — ABNORMAL HIGH (ref 65–99)
HCT: 26 % — ABNORMAL LOW (ref 36.0–46.0)
Hemoglobin: 8.8 g/dL — ABNORMAL LOW (ref 12.0–15.0)
Potassium: 4.3 mmol/L (ref 3.5–5.1)
Sodium: 138 mmol/L (ref 135–145)
TCO2: 14 mmol/L (ref 0–100)

## 2016-09-12 LAB — PROTIME-INR
INR: 1.72
Prothrombin Time: 20.3 seconds — ABNORMAL HIGH (ref 11.4–15.2)

## 2016-09-12 LAB — APTT: aPTT: 36 seconds (ref 24–36)

## 2016-09-12 LAB — PREPARE RBC (CROSSMATCH)

## 2016-09-12 LAB — HEMOGLOBIN AND HEMATOCRIT, BLOOD
HCT: 25.3 % — ABNORMAL LOW (ref 36.0–46.0)
Hemoglobin: 8.7 g/dL — ABNORMAL LOW (ref 12.0–15.0)

## 2016-09-12 LAB — TROPONIN I
Troponin I: 0.06 ng/mL (ref <0.03)
Troponin I: 0.07 ng/mL (ref <0.03)

## 2016-09-12 LAB — TSH: TSH: 2.855 u[IU]/mL (ref 0.350–4.500)

## 2016-09-12 LAB — POC OCCULT BLOOD, ED: Fecal Occult Bld: POSITIVE — AB

## 2016-09-12 LAB — MRSA PCR SCREENING: MRSA by PCR: NEGATIVE

## 2016-09-12 MED ORDER — PANTOPRAZOLE SODIUM 40 MG IV SOLR: 40.0000 mg | Freq: Two times a day (BID) | INTRAVENOUS | Status: DC

## 2016-09-12 MED ORDER — SODIUM CHLORIDE 0.9 % IV SOLN: 80.0000 mg | Freq: Once | INTRAVENOUS | Status: DC

## 2016-09-12 MED ORDER — MIDAZOLAM HCL 2 MG/2ML IJ SOLN
INTRAMUSCULAR | Status: AC | PRN
  Administered 2016-09-12 (×5): 0.5 mg via INTRAVENOUS

## 2016-09-12 MED ORDER — PANTOPRAZOLE SODIUM 40 MG IV SOLR
40.0000 mg | Freq: Two times a day (BID) | INTRAVENOUS | Status: DC
  Administered 2016-09-15: 40 mg via INTRAVENOUS
  Filled 2016-09-12: qty 40

## 2016-09-12 MED ORDER — SODIUM CHLORIDE 0.9 % IV SOLN: 8.0000 mg/h | INTRAVENOUS | Status: DC

## 2016-09-12 MED ORDER — IOPAMIDOL (ISOVUE-300) INJECTION 61%
200.0000 mL | Freq: Once | INTRAVENOUS | Status: AC | PRN
  Administered 2016-09-12: 25 mL via INTRA_ARTERIAL

## 2016-09-12 MED ORDER — LIDOCAINE HCL 1 % IJ SOLN
INTRAMUSCULAR | Status: AC
  Filled 2016-09-12: qty 20

## 2016-09-12 MED ORDER — ACETAMINOPHEN 325 MG PO TABS
325.0000 mg | ORAL_TABLET | Freq: Four times a day (QID) | ORAL | Status: DC | PRN
  Administered 2016-09-12: 650 mg via ORAL
  Administered 2016-09-14: 325 mg via ORAL
  Filled 2016-09-12: qty 1
  Filled 2016-09-12: qty 2

## 2016-09-12 MED ORDER — IOPAMIDOL (ISOVUE-300) INJECTION 61%
100.0000 mL | Freq: Once | INTRAVENOUS | Status: AC | PRN
  Administered 2016-09-12: 65 mL via INTRA_ARTERIAL

## 2016-09-12 MED ORDER — TRAZODONE HCL 50 MG PO TABS
50.0000 mg | ORAL_TABLET | Freq: Every evening | ORAL | Status: DC | PRN
Start: 2016-09-12 — End: 2016-09-15
  Administered 2016-09-14: 50 mg via ORAL
  Filled 2016-09-12: qty 1

## 2016-09-12 MED ORDER — MIDAZOLAM HCL 2 MG/2ML IJ SOLN
INTRAMUSCULAR | Status: AC
  Filled 2016-09-12: qty 2

## 2016-09-12 MED ORDER — SODIUM CHLORIDE 0.9% FLUSH: 10.0000 mL | INTRAVENOUS | Status: DC | PRN

## 2016-09-12 MED ORDER — DEXTROSE 5 % IV SOLN
5.0000 ug/min | INTRAVENOUS | Status: DC
  Administered 2016-09-12 – 2016-09-13 (×2): 5 ug/min via INTRAVENOUS
  Filled 2016-09-12 (×2): qty 4

## 2016-09-12 MED ORDER — ALBUTEROL SULFATE (2.5 MG/3ML) 0.083% IN NEBU
2.5000 mg | INHALATION_SOLUTION | Freq: Four times a day (QID) | RESPIRATORY_TRACT | Status: DC | PRN
Start: 2016-09-12 — End: 2016-09-15
  Administered 2016-09-14: 2.5 mg via RESPIRATORY_TRACT
  Filled 2016-09-12 (×2): qty 3

## 2016-09-12 MED ORDER — SODIUM CHLORIDE 0.9 % IV BOLUS (SEPSIS)
1000.0000 mL | Freq: Once | INTRAVENOUS | Status: AC
  Administered 2016-09-12: 1000 mL via INTRAVENOUS

## 2016-09-12 MED ORDER — TECHNETIUM TC 99M-LABELED RED BLOOD CELLS IV KIT
22.1000 | PACK | Freq: Once | INTRAVENOUS | Status: AC | PRN
  Administered 2016-09-12: 22.1 via INTRAVENOUS

## 2016-09-12 MED ORDER — MIDAZOLAM HCL 2 MG/2ML IJ SOLN
INTRAMUSCULAR | Status: DC | PRN
  Administered 2016-09-12: 0.5 mg via INTRAVENOUS

## 2016-09-12 MED ORDER — PANTOPRAZOLE SODIUM 40 MG IV SOLR
80.0000 mg | Freq: Once | INTRAVENOUS | Status: AC
  Administered 2016-09-12: 80 mg via INTRAVENOUS
  Filled 2016-09-12: qty 80

## 2016-09-12 MED ORDER — SODIUM CHLORIDE 0.9 % IV BOLUS (SEPSIS)
2000.0000 mL | Freq: Once | INTRAVENOUS | Status: AC
  Administered 2016-09-12: 2000 mL via INTRAVENOUS

## 2016-09-12 MED ORDER — SODIUM CHLORIDE 0.9 % IV SOLN
INTRAVENOUS | Status: DC
  Administered 2016-09-12 – 2016-09-15 (×9): via INTRAVENOUS

## 2016-09-12 MED ORDER — SODIUM CHLORIDE 0.9 % IV SOLN
8.0000 mg/h | INTRAVENOUS | Status: AC
  Administered 2016-09-12 – 2016-09-14 (×7): 8 mg/h via INTRAVENOUS
  Filled 2016-09-12 (×13): qty 80

## 2016-09-12 MED ORDER — MIDAZOLAM HCL 2 MG/2ML IJ SOLN
INTRAMUSCULAR | Status: AC
  Filled 2016-09-12: qty 4

## 2016-09-12 MED ORDER — FENTANYL CITRATE (PF) 100 MCG/2ML IJ SOLN
INTRAMUSCULAR | Status: DC
Start: 2016-09-12 — End: 2016-09-12
  Filled 2016-09-12: qty 2

## 2016-09-12 MED ORDER — SODIUM CHLORIDE 0.9 % IV SOLN
Freq: Once | INTRAVENOUS | Status: AC
  Administered 2016-09-12: 13:00:00 via INTRAVENOUS

## 2016-09-12 MED ORDER — SODIUM CHLORIDE 0.9 % IV SOLN: 10.0000 mL/h | Freq: Once | INTRAVENOUS | Status: DC

## 2016-09-12 MED ORDER — ORAL CARE MOUTH RINSE
15.0000 mL | Freq: Two times a day (BID) | OROMUCOSAL | Status: DC
  Administered 2016-09-13 – 2016-09-15 (×3): 15 mL via OROMUCOSAL

## 2016-09-12 MED ORDER — TIOTROPIUM BROMIDE MONOHYDRATE 18 MCG IN CAPS
18.0000 ug | ORAL_CAPSULE | Freq: Every day | RESPIRATORY_TRACT | Status: DC
  Administered 2016-09-13 – 2016-09-14 (×2): 18 ug via RESPIRATORY_TRACT
  Filled 2016-09-12 (×3): qty 5

## 2016-09-12 MED ORDER — CHLORHEXIDINE GLUCONATE 0.12 % MT SOLN
15.0000 mL | Freq: Two times a day (BID) | OROMUCOSAL | Status: DC
  Administered 2016-09-13 – 2016-09-15 (×4): 15 mL via OROMUCOSAL
  Filled 2016-09-12 (×3): qty 15

## 2016-09-12 MED ORDER — SODIUM CHLORIDE 0.9 % IV BOLUS (SEPSIS)
500.0000 mL | Freq: Once | INTRAVENOUS | Status: AC
  Administered 2016-09-12: 500 mL via INTRAVENOUS

## 2016-09-12 NOTE — Consult Note (Signed)
Chief Complaint: lower GI  bleed  Referring Physician:Dr. Leisa Lenz  Supervising Physician: Jacqulynn Cadet  Patient Status: In-pt   HPI: Desiree Hutchinson is an 82 y.o. female who is a DNR, but started to have BRB per her rectum last night at midnight.  Her granddaughter who lives with her brought her to the ED.  She was admitted and has continued to bleed.  She has had 2 units of pRBCs transfused.  Her BP is still in the 70-80s.  She just had her nuc med bleeding scan that revealed active acute bleeding in her colon.  We are consulted for an angioembolization.  Past Medical History:  Past Medical History:  Diagnosis Date  . Allergic rhinitis   . Anemia   . Aortic valve disease    regurge > stenosis  . Chest pain, non-cardiac   . Chronic diastolic heart failure ([place])   . COPD (chronic obstructive pulmonary disease) ([place])   . Cough   . Gastritis   . History of atherosclerotic cardiovascular disease   . History of small bowel obstruction   . Hypertension   . Left ventricular hypertrophy   . Obesity   . Personal history of tobacco use, presenting hazards to health     Past Surgical History:  Past Surgical History:  Procedure Laterality Date  . adenoasine cardiolite  11/09/2004  . APPENDECTOMY    . BOWEL RESECTION N/A 08/10/2015   Procedure: SMALL BOWEL OBSTRUCTION;  Surgeon: Excell Seltzer, MD;  Location: [facility];  Service: General;  Laterality: N/A;  . ESOPHAGOGASTRODUODENOSCOPY  11/09/1999  . HERNIA REPAIR    . LAPAROTOMY  08/10/2015   Procedure: EXPLORATORY LAPAROTOMY;  Surgeon: Excell Seltzer, MD;  Location: [facility];  Service: General;;  . LYSIS OF ADHESION  08/10/2015   Procedure: LYSIS OF ADHESION;  Surgeon: Excell Seltzer, MD;  Location: [facility];  Service: General;;  . small bowel obstruction  06/1996  . transthoracic cardiolite   11/03/2006    Family History: History reviewed. No pertinent family history.  Social History:  reports that she quit smoking  about 6 years ago. Her smoking use included Cigarettes. She has a 3.00 pack-year smoking history. She has never used smokeless tobacco. She reports that she does not drink alcohol or use drugs.  Allergies: No Known Allergies  Medications: Medications reviewed in epic  Please HPI for pertinent positives, otherwise complete 10 system ROS negative.  Mallampati Score: MD Evaluation Airway: WNL Heart: WNL Abdomen: WNL Chest/ Lungs: WNL ASA  Classification: 3 Mallampati/Airway Score: Two  Physical Exam: BP (!) 91/44   Pulse 85   Temp 97.5 F (36.4 C) (Axillary)   Resp (!) 24   Ht 5' (1.524 m)   Wt 97 lb 3.6 oz (44.1 kg)   SpO2 100%   BMI 18.99 kg/m  Body mass index is 18.99 kg/m. General: pleasant, elderly black female who is laying in bed intermittently somnolent HEENT: head is normocephalic, atraumatic.  Sclera are noninjected.  PERRL.  Ears and nose without any masses or lesions.  Mouth is pink Heart: regular, rate, and rhythm.  Normal s1,s2. No obvious murmurs, gallops, or rubs noted.  Palpable radial and pedal pulses bilaterally Lungs: CTAB, no wheezes, rhonchi, or rales noted.  Respiratory effort nonlabored Abd: soft, NT, ND, +BS, no masses, hernias, or organomegaly Psych: Alert at times, but mostly somnolent.   Labs: Results for orders placed or performed during the hospital encounter of 09/12/16 (from the past 48 hour(s))  Type  and screen     Status: None (Preliminary result)   Collection Time: 09/12/16  1:30 AM  Result Value Ref Range   ABO/RH(D) O POS    Antibody Screen NEG    Sample Expiration 09/15/2016    Unit Number [id number]    Blood Component Type RED CELLS,LR    Unit division 00    Status of Unit ISSUED    Transfusion Status OK TO TRANSFUSE    Crossmatch Result Compatible    Unit Number [id number]    Blood Component Type RED CELLS,LR    Unit division 00    Status of Unit ISSUED    Transfusion Status OK TO TRANSFUSE    Crossmatch Result  Compatible    Unit Number [id number]    Blood Component Type RED CELLS,LR    Unit division 00    Status of Unit ISSUED    Transfusion Status OK TO TRANSFUSE    Crossmatch Result Compatible    Unit Number [id number]    Blood Component Type RED CELLS,LR    Unit division 00    Status of Unit ALLOCATED    Transfusion Status OK TO TRANSFUSE    Crossmatch Result Compatible   CBC with Differential/Platelet     Status: Abnormal   Collection Time: 09/12/16  1:37 AM  Result Value Ref Range   WBC 8.3 4.0 - 10.5 K/uL   RBC 2.91 (L) 3.87 - 5.11 MIL/uL   Hemoglobin 9.0 (L) 12.0 - 15.0 g/dL   HCT 27.7 (L) 36.0 - 46.0 %   MCV 95.2 78.0 - 100.0 fL   MCH 30.9 26.0 - 34.0 pg   MCHC 32.5 30.0 - 36.0 g/dL   RDW 15.0 11.5 - 15.5 %   Platelets 157 150 - 400 K/uL   Neutrophils Relative % 69 %   Neutro Abs 5.7 1.7 - 7.7 K/uL   Lymphocytes Relative 27 %   Lymphs Abs 2.2 0.7 - 4.0 K/uL   Monocytes Relative 4 %   Monocytes Absolute 0.3 0.1 - 1.0 K/uL   Eosinophils Relative 0 %   Eosinophils Absolute 0.0 0.0 - 0.7 K/uL   Basophils Relative 0 %   Basophils Absolute 0.0 0.0 - 0.1 K/uL  Comprehensive metabolic panel     Status: Abnormal   Collection Time: 09/12/16  1:37 AM  Result Value Ref Range   Sodium 138 135 - 145 mmol/L   Potassium 4.3 3.5 - 5.1 mmol/L   Chloride 112 (H) 101 - 111 mmol/L   CO2 20 (L) 22 - 32 mmol/L   Glucose, Bld 197 (H) 65 - 99 mg/dL   BUN 34 (H) 6 - 20 mg/dL   Creatinine, Ser 1.24 (H) 0.44 - 1.00 mg/dL   Calcium 9.0 8.9 - 10.3 mg/dL   Total Protein 6.0 (L) 6.5 - 8.1 g/dL   Albumin 3.6 3.5 - 5.0 g/dL   AST 21 15 - 41 U/L   ALT 9 (L) 14 - 54 U/L   Alkaline Phosphatase 85 38 - 126 U/L   Total Bilirubin 0.3 0.3 - 1.2 mg/dL   GFR calc non Af Amer 36 (L) >60 mL/min   GFR calc Af Amer 42 (L) >60 mL/min    Comment: (NOTE) The eGFR has been calculated using the CKD EPI equation. This calculation has not been validated in all clinical situations. eGFR's persistently <60  mL/min signify possible Chronic Kidney Disease.    Anion gap 6 5 - 15  Prepare RBC     Status:  None   Collection Time: 09/12/16  1:37 AM  Result Value Ref Range   Order Confirmation ORDER PROCESSED BY BLOOD BANK   I-Stat Chem 8, ED     Status: Abnormal   Collection Time: 09/12/16  2:00 AM  Result Value Ref Range   Sodium 138 135 - 145 mmol/L   Potassium 4.3 3.5 - 5.1 mmol/L   Chloride 111 101 - 111 mmol/L   BUN 35 (H) 6 - 20 mg/dL   Creatinine, Ser 1.30 (H) 0.44 - 1.00 mg/dL   Glucose, Bld 191 (H) 65 - 99 mg/dL   Calcium, Ion 1.10 (L) 1.15 - 1.40 mmol/L   TCO2 14 0 - 100 mmol/L   Hemoglobin 8.8 (L) 12.0 - 15.0 g/dL   HCT 26.0 (L) 36.0 - 46.0 %  POC occult blood, ED Provider will collect     Status: Abnormal   Collection Time: 09/12/16  2:02 AM  Result Value Ref Range   Fecal Occult Bld POSITIVE (A) NEGATIVE  Prepare RBC     Status: None   Collection Time: 09/12/16 10:00 AM  Result Value Ref Range   Order Confirmation ORDER PROCESSED BY BLOOD BANK     Imaging: Nm Gi Blood Loss  Result Date: 09/12/2016 CLINICAL DATA:  Gastrointestinal bleeding. EXAM: NUCLEAR MEDICINE GASTROINTESTINAL BLEEDING SCAN TECHNIQUE: Sequential abdominal images were obtained following intravenous administration of Tc-70mlabeled red blood cells. RADIOPHARMACEUTICALS:  20.1 mCi Tc-962mn-vitro labeled red cells. COMPARISON:  None. FINDINGS: Immediately upon imaging, tagged red blood cells localized to the RIGHT lower quadrant. More delayed imaging demonstrates tagged red blood cells advancing to the transverse colon and LEFT colon. IMPRESSION: Active gastrointestinal bleeding initiating in the cecum / ascending colon. Findings conveyed to McLaurence FerrariMD on 09/12/2016  at09:39. Electronically Signed   By: StSuzy Bouchard.D.   On: 09/12/2016 09:40   Dg Chest Portable 1 View  Result Date: 09/12/2016 CLINICAL DATA:  Rectal bleeding since last night. Weakness. Shortness of breath. EXAM: PORTABLE CHEST 1 VIEW  COMPARISON:  08/06/2015 FINDINGS: Unchanged mediastinal contours with thoracic aortic atherosclerosis. Chronic appearing increased interstitial opacities, suggesting emphysema. Calcified granuloma in the right midlung zone. No focal airspace disease, evidence of pulmonary edema, pleural effusion or pneumothorax. The bones are under mineralized. IMPRESSION: Chronic interstitial thickening.  No evidence of acute abnormality. Thoracic aortic atherosclerosis. Electronically Signed   By: MeJeb Levering.D.   On: 09/12/2016 02:28    Assessment/Plan 1. Acute lower GI bleed -we will perform a visceral angiogram and possible embolization.  The patient is also critically ill right now with hypotension.  We will also place a central line.  We have contacted the primary service to recommend critical care involvement as the patient may need pressor involvement etc.  More blood has also been ordered and we will go ahead and given one unit during the procedure and we have one more on hold. -we discussed this procedure with the patient, but due to intermittent somnolence, her granddaughter has signed her consent for her.  Her granddaughter has been placed on her advanced directive. -Risks and Benefits discussed with the patient including, but not limited to bleeding, infection, vascular injury or contrast induced renal failure. All of the patient's questions were answered, patient is agreeable to proceed. Consent signed and in chart.  Thank you for this interesting consult.  I greatly enjoyed meeting Desiree TANKARDnd look forward to participating in their care.  A copy of this report was sent to the requesting  provider on this date.  Electronically Signed: Henreitta Cea 09/12/2016, 10:20 AM   I spent a total of 55 Miinutes    in face to face in clinical consultation, greater than 50% of which was counseling/coordinating care for acute lower GI bleed, hypotension

## 2016-09-12 NOTE — H&P (Signed)
History and Physical    Desiree Hutchinson ZOX:[id number] DOB: 1920/12/25 DOA: 09/12/2016  Referring MD/NP/PA: Dr Nicanor Alcon PCP: [name], MD   Outpatient Specialists: Unknown Patient coming from: Home  Chief Complaint: rectal bleeding  HPI: Desiree Hutchinson is a 82 y.o. female with medical history COPD, Chronic Diastolic Heart Failure, history of Small Bowel Obstruction in 07/2015 s/p Adhesiolysis and prior Diverticular Bleed in 06/2015, presenting with acute GI bleeding with Hypotension.  About an hour prior to presentation, sometime after midnight, she told her grand daughter that she had seen blood in the commode after going to the bathroom. She was noted to have bright red blood stain in her underwear. She denied any abdominal pains, or nausea but had been feeling fatigued recently and had noted her stools to be a little dark which she attributed to her iron pills.   Upon Evaluation at ED she was noted to be hypotensive with a BP of 65/33, HR 75/min, afebrile with O2 sat 100%. Rectal Exam per EDP noted blood on examining Finger   ED Course: She was given IV N/S bolus with improvement in her Hemodynamic parameters and  GI consulted by EDP. Hospitalist Service was consulted for admission.  Review of Systems: As per HPI otherwise 10 point review of systems negative.   Past Medical History:  Diagnosis Date  . Allergic rhinitis   . Anemia   . Aortic valve disease    regurge > stenosis  . Chest pain, non-cardiac   . Chronic diastolic heart failure ([facility])   . COPD (chronic obstructive pulmonary disease) ([facility])   . Cough   . Gastritis   . History of atherosclerotic cardiovascular disease   . History of small bowel obstruction   . Hypertension   . Left ventricular hypertrophy   . Obesity   . Personal history of tobacco use, presenting hazards to health     Past Surgical History:  Procedure Laterality Date  . adenoasine cardiolite  11/09/2004  . APPENDECTOMY    . BOWEL  RESECTION N/A 08/10/2015   Procedure: SMALL BOWEL OBSTRUCTION;  Surgeon: Glenna Fellows, MD;  Location: [facility];  Service: General;  Laterality: N/A;  . ESOPHAGOGASTRODUODENOSCOPY  11/09/1999  . HERNIA REPAIR    . LAPAROTOMY  08/10/2015   Procedure: EXPLORATORY LAPAROTOMY;  Surgeon: Glenna Fellows, MD;  Location: [facility];  Service: General;;  . LYSIS OF ADHESION  08/10/2015   Procedure: LYSIS OF ADHESION;  Surgeon: Glenna Fellows, MD;  Location: [facility];  Service: General;;  . small bowel obstruction  06/1996  . transthoracic cardiolite   11/03/2006     reports that she quit smoking about 6 years ago. Her smoking use included Cigarettes. She has a 3.00 pack-year smoking history. She has never used smokeless tobacco. She reports that she does not drink alcohol or use drugs.  No Known Allergies  History reviewed. No pertinent family history.  Prior to Admission medications   Medication Sig Start Date End Date Taking? Authorizing Provider  acetaminophen (TYLENOL) 325 MG tablet Take 1-2 tablets (325-650 mg total) by mouth every 6 (six) hours as needed for fever, headache, mild pain or moderate pain. 08/22/15  Yes Hollice Espy, MD  amLODipine (NORVASC) 5 MG tablet Take 5 mg by mouth daily. 11/07/15  Yes Historical Provider, MD  aspirin EC 81 MG tablet Take 81 mg by mouth daily.   Yes Historical Provider, MD  ferrous sulfate 325 (65 FE) MG tablet TAKE 1 TABLET (325 MG TOTAL) BY MOUTH  DAILY WITH BREAKFAST. 07/05/16  Yes [name], MD  hydrALAZINE (APRESOLINE) 25 MG tablet TAKE 1 TABLET BY MOUTH 2 TIMES DAILY. 05/10/16  Yes Tonny Bollman, MD  Incontinence Supply Disposable (REALITY INCONTINENT BRIEFS SM) MISC 10 per day  R32 09/26/15  Yes [name], MD  lisinopril (PRINIVIL,ZESTRIL) 2.5 MG tablet TAKE 1 TABLET (2.5 MG TOTAL) BY MOUTH DAILY. 02/05/16  Yes Tonny Bollman, MD  polyethylene glycol powder (GLYCOLAX/MIRALAX) powder Take 17 g by mouth daily. Patient taking differently: Take 17 g  by mouth daily as needed (for constipation).  07/23/15  Yes Renae Fickle, MD  SPIRIVA RESPIMAT 2.5 MCG/ACT AERS INHALE 2 PUFFS INTO THE LUNGS DAILY. 07/12/16  Yes Praveen Mannam, MD  carvedilol (COREG) 3.125 MG tablet Take 1 tablet (3.125 mg total) by mouth 2 (two) times daily with a meal. Patient not taking: Reported on 09/12/2016 08/22/15   Hollice Espy, MD  feeding supplement (BOOST / RESOURCE BREEZE) LIQD Take 1 Container by mouth 3 (three) times daily between meals. Patient not taking: Reported on 09/12/2016 11/12/15   [name], MD    Physical Exam: Vitals:   09/12/16 0300 09/12/16 0321 09/12/16 0329 09/12/16 0400  BP: (!) 75/53 (!) 92/39 113/67 (!) 113/49  Pulse:  70 83 77  Resp: 18 18  18   Temp: (!) 96.8 F (36 C) (!) 96.5 F (35.8 C)    TempSrc:      SpO2: 100% 99% 100% 100%      Constitutional: NAD, calm, comfortable Vitals:   09/12/16 0300 09/12/16 0321 09/12/16 0329 09/12/16 0400  BP: (!) 75/53 (!) 92/39 113/67 (!) 113/49  Pulse:  70 83 77  Resp: 18 18  18   Temp: (!) 96.8 F (36 C) (!) 96.5 F (35.8 C)    TempSrc:      SpO2: 100% 99% 100% 100%    Eyes: PERRL, lids normal, scleral pallor noted ENMT: Edentulous. Mucous membranes are moist. Posterior pharynx clear of any exudate or lesions.Normal dentition.  Neck: normal, supple, no masses, no thyromegaly Respiratory: mildly diminished BS, otherwise clear to auscultation bilaterally, no wheezing, no crackles. Normal respiratory effort. No accessory muscle use.  Cardiovascular: Regular rate and rhythm, no murmurs / rubs / gallops. No extremity edema. 2+ pedal pulses. No carotid bruits.  Abdomen: no tenderness, no masses palpated. No hepatosplenomegaly. Bowel sounds positive. About 60 mls of Blood Clots noted actively coming out of her anal verge. Musculoskeletal: no clubbing / cyanosis. Good ROM, no contractures. Normal muscle tone.  Skin: no rashes, lesions, ulcers. No induration Neurologic: CN 2-12 grossly  intact. Sensation intact, DTR normal. Strength 5/5 in all 4.  Psychiatric: Normal judgment and insight. Alert and responsive. Normal mood.    Labs on Admission: I have personally reviewed following labs and imaging studies  CBC:  Recent Labs Lab 09/12/16 0137 09/12/16 0200  WBC 8.3  --   NEUTROABS 5.7  --   HGB 9.0* 8.8*  HCT 27.7* 26.0*  MCV 95.2  --   PLT 157  --    Basic Metabolic Panel:  Recent Labs Lab 09/12/16 0137 09/12/16 0200  NA 138 138  K 4.3 4.3  CL 112* 111  CO2 20*  --   GLUCOSE 197* 191*  BUN 34* 35*  CREATININE 1.24* 1.30*  CALCIUM 9.0  --    GFR: CrCl cannot be calculated (Unknown ideal weight.). Liver Function Tests:  Recent Labs Lab 09/12/16 0137  AST 21  ALT 9*  ALKPHOS 85  BILITOT 0.3  PROT 6.0*  ALBUMIN 3.6   No results for input(s): LIPASE, AMYLASE in the last 168 hours. No results for input(s): AMMONIA in the last 168 hours. Coagulation Profile: No results for input(s): INR, PROTIME in the last 168 hours. Cardiac Enzymes: No results for input(s): CKTOTAL, CKMB, CKMBINDEX, TROPONINI in the last 168 hours. BNP (last 3 results) No results for input(s): PROBNP in the last 8760 hours. HbA1C: No results for input(s): HGBA1C in the last 72 hours. CBG: No results for input(s): GLUCAP in the last 168 hours. Lipid Profile: No results for input(s): CHOL, HDL, LDLCALC, TRIG, CHOLHDL, LDLDIRECT in the last 72 hours. Thyroid Function Tests: No results for input(s): TSH, T4TOTAL, FREET4, T3FREE, THYROIDAB in the last 72 hours. Anemia Panel: No results for input(s): VITAMINB12, FOLATE, FERRITIN, TIBC, IRON, RETICCTPCT in the last 72 hours. Urine analysis:    Component Value Date/Time   COLORURINE YELLOW 08/18/2015 1109   APPEARANCEUR CLEAR 08/18/2015 1109   LABSPEC 1.010 08/18/2015 1109   PHURINE 7.0 08/18/2015 1109   GLUCOSEU NEGATIVE 08/18/2015 1109   GLUCOSEU NEGATIVE 01/10/2015 1611   HGBUR NEGATIVE 08/18/2015 1109   BILIRUBINUR  NEGATIVE 08/18/2015 1109   KETONESUR NEGATIVE 08/18/2015 1109   PROTEINUR NEGATIVE 08/18/2015 1109   UROBILINOGEN 0.2 08/18/2015 1109   NITRITE NEGATIVE 08/18/2015 1109   LEUKOCYTESUR NEGATIVE 08/18/2015 1109   Sepsis Labs: @LABRCNTIP (procalcitonin:4,lacticidven:4) )No results found for this or any previous visit (from the past 240 hour(s)).   Radiological Exams on Admission: Dg Chest Portable 1 View  Result Date: 09/12/2016 CLINICAL DATA:  Rectal bleeding since last night. Weakness. Shortness of breath. EXAM: PORTABLE CHEST 1 VIEW COMPARISON:  08/06/2015 FINDINGS: Unchanged mediastinal contours with thoracic aortic atherosclerosis. Chronic appearing increased interstitial opacities, suggesting emphysema. Calcified granuloma in the right midlung zone. No focal airspace disease, evidence of pulmonary edema, pleural effusion or pneumothorax. The bones are under mineralized. IMPRESSION: Chronic interstitial thickening.  No evidence of acute abnormality. Thoracic aortic atherosclerosis. Electronically Signed   By: Rubye OaksMelanie  Ehinger M.D.   On: 09/12/2016 02:28    EKG: ordered  Assessment/Plan Active Problems:   Hypotension   Anemia due to gastrointestinal blood loss   GIB (gastrointestinal bleeding)   Acute GI bleeding  1.Acute GI Bleed with Hypotension:  Likely Diverticular bleed.  Type and cross for 4 units PRBC and transfuse 2 units now.   IV PPI  Judicious IVF with cardiac history.  Serial Monitoring of Hematologic Indices.  NPO for now,  I personally called Dr Fritzi MandesAmbruster this morning to update him on patient's clinical status and he was kind to come in the see her early this morning,  2. Hypertension:  Hold all anti-hypertensives for now with hypotension.   Resume when stabilised.  3. Blood Loss Anemia:  Hematinics when stabilized. Follow H/H, transfuse prn ( cardiac History)  4. Acute on CRI:  Monitor renal function as her BP stabilizes. Avoid  nephrotoxics.   DVT prophylaxis: (SCD) Code Status: (DNR) Family Communication: Tharon Aquasvette Bethea [facility](Grand daughter, tel # [phone]) Disposition Plan: SNF Consults called: Dr Fritzi MandesAmbruster Perlie Mayo([facility]) Admission status: ( ICU)   OSEI-BONSU,Loden Laurent MD [facility] Pager [phone]  If 7PM-7AM, please contact night-coverage [email or web address] Password [email or web address]  09/12/2016, 5:11 AM

## 2016-09-12 NOTE — ED Notes (Signed)
Large amount of blood noted when cleaning pt.

## 2016-09-12 NOTE — Progress Notes (Signed)
*  PRELIMINARY RESULTS* Echocardiogram 2D Echocardiogram has been performed.  Desiree Hutchinson, Desiree Hutchinson 09/12/2016, 3:55 PM

## 2016-09-12 NOTE — ED Provider Notes (Addendum)
[facility]-EMERGENCY DEPT Provider Note   CSN: [id number] Arrival date & time: 09/12/16  0101  By signing my name below, I, Desiree Hutchinson, attest that this documentation has been prepared under the direction and in the presence of [name], MD. Electronically Signed: Doreatha Hutchinson, ED Scribe. 09/12/16. 1:33 AM.     History   Chief Complaint Chief Complaint  Patient presents with  . Rectal Bleeding    HPI Desiree Hutchinson is a 82 y.o. female with h/o bowel resection 08/10/15 who presents to the Emergency Department complaining of intermittent rectal bleeding 1 hour ago. Pt states her stool have been dark in coloration. Per nursing, the pt told them her bleeding began yesterday but did not want her daughter to know before it worsened 1 hour ago. She reports she did not have a bowel movement for 2 days prior to her current symptoms. She also notes her stool is dark at baseline secondary to iron supplements. Per daughter, she did not witness blood in the pts bowel movements this evening, but did note blood in the pts pants. Daughter also notes that the pt has exhibited more fatigue recently. Pt denies emesis, hematemesis, abdominal pain, indigestion, exertional SOB.   The history is provided by the patient and a relative. No language interpreter was used.  Rectal Bleeding  Quality:  Black and tarry and maroon Amount:  Moderate Duration:  1 day Timing:  Intermittent Chronicity:  New Context: not rectal injury and not rectal pain   Similar prior episodes: no   Relieved by:  None tried Worsened by:  Nothing Ineffective treatments:  None tried Associated symptoms: no abdominal pain, no hematemesis and no vomiting   Risk factors: no NSAID use     Past Medical History:  Diagnosis Date  . Allergic rhinitis   . Anemia   . Aortic valve disease    regurge > stenosis  . Chest pain, non-cardiac   . Chronic diastolic heart failure (HCC)   . COPD (chronic obstructive pulmonary disease) (HCC)   .  Cough   . Gastritis   . History of atherosclerotic cardiovascular disease   . History of small bowel obstruction   . Hypertension   . Left ventricular hypertrophy   . Obesity   . Personal history of tobacco use, presenting hazards to health     Patient Active Problem List   Diagnosis Date Noted  . PAD (peripheral artery disease) (HCC) 04/06/2016  . Iron deficiency anemia 02/13/2016  . Urinary incontinence 09/26/2015  . Chronic diastolic heart failure (HCC) 08/20/2015  . SBO (small bowel obstruction) (HCC) 08/06/2015  . AKI (acute kidney injury) (HCC) 08/06/2015  . Protein-calorie malnutrition (HCC) 07/21/2015  . Lower GI bleed 07/19/2015  . Diverticulosis of colon with hemorrhage 07/19/2015  . Acute blood loss anemia 07/19/2015  . Constipation 01/10/2015  . Loss of weight 01/10/2015  . Memory loss of 01/10/2015  . Hearing loss 01/08/2014  . Renal insufficiency 03/27/2013  . Encounter for long-term (current) use of other medications 03/27/2013  . Aortic valve disorder 06/15/2010  . CHRONIC RHINITIS 06/26/2009  . SHORTNESS OF BREATH 05/29/2009  . OTHER DYSPNEA AND RESPIRATORY ABNORMALITIES 05/29/2009  . PRECORDIAL PAIN 05/29/2009  . COPD with emphysema Gold Stage A 05/28/2009  . SMALL BOWEL OBSTRUCTION, HX OF 05/28/2009  . GASTRITIS 03/28/2008  . COUGH 12/07/2007  . Essential hypertension 10/14/2007  . Cardiovascular disease 10/14/2007  . VENTRICULAR HYPERTROPHY, LEFT 10/14/2007  . ALLERGIC RHINITIS 10/14/2007  . CHEST PAIN, NON-CARDIAC 10/14/2007  .  TOBACCO USE, QUIT 10/14/2007    Past Surgical History:  Procedure Laterality Date  . adenoasine cardiolite  11/09/2004  . APPENDECTOMY    . BOWEL RESECTION N/A 08/10/2015   Procedure: SMALL BOWEL OBSTRUCTION;  Surgeon: Glenna Fellows, MD;  Location: [facility];  Service: General;  Laterality: N/A;  . ESOPHAGOGASTRODUODENOSCOPY  11/09/1999  . HERNIA REPAIR    . LAPAROTOMY  08/10/2015   Procedure: EXPLORATORY LAPAROTOMY;   Surgeon: Glenna Fellows, MD;  Location: [facility];  Service: General;;  . LYSIS OF ADHESION  08/10/2015   Procedure: LYSIS OF ADHESION;  Surgeon: Glenna Fellows, MD;  Location: [facility];  Service: General;;  . small bowel obstruction  06/1996  . transthoracic cardiolite   11/03/2006    OB History    No data available       Home Medications    Prior to Admission medications   Medication Sig Start Date End Date Taking? Authorizing Provider  acetaminophen (TYLENOL) 325 MG tablet Take 1-2 tablets (325-650 mg total) by mouth every 6 (six) hours as needed for fever, headache, mild pain or moderate pain. 08/22/15   Hollice Espy, MD  amLODipine (NORVASC) 5 MG tablet Take 5 mg by mouth daily. 11/07/15   Historical Provider, MD  aspirin 81 MG tablet Take 81 mg by mouth daily.    Historical Provider, MD  carvedilol (COREG) 3.125 MG tablet Take 1 tablet (3.125 mg total) by mouth 2 (two) times daily with a meal. 08/22/15   Hollice Espy, MD  feeding supplement (BOOST / RESOURCE BREEZE) LIQD Take 1 Container by mouth 3 (three) times daily between meals. 11/12/15   [name], MD  ferrous sulfate 325 (65 FE) MG tablet TAKE 1 TABLET (325 MG TOTAL) BY MOUTH DAILY WITH BREAKFAST. 07/05/16   [name], MD  hydrALAZINE (APRESOLINE) 25 MG tablet Take 1 tablet by mouth 2 (two) times daily.  12/15/15   Historical Provider, MD  hydrALAZINE (APRESOLINE) 25 MG tablet TAKE 1 TABLET BY MOUTH 2 TIMES DAILY. 05/10/16   Tonny Bollman, MD  HYDROcodone-acetaminophen (NORCO/VICODIN) 5-325 MG per tablet Take 1 tablet by mouth every 4 (four) hours as needed for moderate pain or severe pain. 08/22/15   Hollice Espy, MD  Incontinence Supply Disposable (REALITY INCONTINENT BRIEFS SM) MISC 10 per day  R32 09/26/15   [name], MD  lisinopril (PRINIVIL,ZESTRIL) 2.5 MG tablet TAKE 1 TABLET (2.5 MG TOTAL) BY MOUTH DAILY. 02/05/16   Tonny Bollman, MD  polyethylene glycol powder (GLYCOLAX/MIRALAX) powder Take 17 g by  mouth daily. 07/23/15   Renae Fickle, MD  simethicone (MYLICON) 80 MG chewable tablet Chew 80 mg by mouth every 6 (six) hours as needed for flatulence. Reported on 04/06/2016    Historical Provider, MD  sodium chloride (OCEAN) 0.65 % SOLN nasal spray Place 1 spray into both nostrils as needed for congestion. Reported on 04/06/2016    Historical Provider, MD  SPIRIVA RESPIMAT 2.5 MCG/ACT AERS INHALE 2 PUFFS INTO THE LUNGS DAILY. 07/12/16   Chilton Greathouse, MD    Family History No family history on file.  Social History Social History  Substance Use Topics  . Smoking status: Former Smoker    Packs/day: 0.10    Years: 30.00    Types: Cigarettes    Quit date: 01/24/2010  . Smokeless tobacco: Never Used     Comment: smoked 1-2 cigarettes/day  . Alcohol use No     Allergies   Review of patient's allergies indicates no known allergies.  Review of Systems Review of Systems  Constitutional: Positive for fatigue.  Respiratory: Negative for shortness of breath.   Gastrointestinal: Positive for hematochezia. Negative for abdominal pain, hematemesis and vomiting.  All other systems reviewed and are negative.    Physical Exam Updated Vital Signs BP (!) 65/33 (BP Location: Right Arm)   Physical Exam  Constitutional: She is oriented to person, place, and time. She appears well-developed and well-nourished.  HENT:  Head: Normocephalic and atraumatic.  Mouth/Throat: Oropharynx is clear and moist. No oropharyngeal exudate.  Pale under tongue  Eyes: EOM are normal. Pupils are equal, round, and reactive to light.  Pale conjunctiva   Neck: Normal range of motion. Neck supple. No JVD present. No tracheal deviation present.  No carotid bruits. Trachea midline.   Cardiovascular: Normal rate, regular rhythm and normal heart sounds.  Exam reveals no gallop and no friction rub.   No murmur heard. RRR.   Pulmonary/Chest: Effort normal and breath sounds normal. No stridor. No respiratory  distress. She has no wheezes. She has no rales.  Lungs CTA bilaterally.   Abdominal: Soft. She exhibits no distension and no mass. There is no rebound and no guarding.  Hyperactive bowel sounds.   Genitourinary:  Genitourinary Comments: Large clots eminating from the rectal vault with melena. Chaperone present throughout entire exam.    Musculoskeletal: Normal range of motion.  Pale nailbeds. No BLE edema. All compartments are soft.   Lymphadenopathy:    She has no cervical adenopathy.  Neurological: She is alert and oriented to person, place, and time. She has normal reflexes.  Skin: Skin is warm and dry.  Psychiatric: She has a normal mood and affect.  Nursing note and vitals reviewed.    ED Treatments / Results   DIAGNOSTIC STUDIES: Oxygen Saturation is 100% on RA, normal by my interpretation.    COORDINATION OF CARE: 1:30 AM Discussed treatment plan with pt at bedside which includes lab work and pt agreed to plan. Suspect GI bleed. Will start Protonix drip, obtain acute abdominal series, admit to hospital.    2:46 AM Consulted with hospitalist, who will admit pt to stepdown. Will consult GI per admitting physician.  Case d/w Dr. Adela LankArmbruster of GI for medicine.  NG lavage vs. CTA of the abdomen vs.  Red cell scan.      Labs (all labs ordered are listed, but only abnormal results are displayed) Results for orders placed or performed during the hospital encounter of 09/12/16  CBC with Differential/Platelet  Result Value Ref Range   WBC 8.3 4.0 - 10.5 K/uL   RBC 2.91 (L) 3.87 - 5.11 MIL/uL   Hemoglobin 9.0 (L) 12.0 - 15.0 g/dL   HCT 16.127.7 (L) 09.636.0 - 04.546.0 %   MCV 95.2 78.0 - 100.0 fL   MCH 30.9 26.0 - 34.0 pg   MCHC 32.5 30.0 - 36.0 g/dL   RDW 40.915.0 81.111.5 - 91.415.5 %   Platelets 157 150 - 400 K/uL   Neutrophils Relative % 69 %   Neutro Abs 5.7 1.7 - 7.7 K/uL   Lymphocytes Relative 27 %   Lymphs Abs 2.2 0.7 - 4.0 K/uL   Monocytes Relative 4 %   Monocytes Absolute 0.3 0.1 - 1.0  K/uL   Eosinophils Relative 0 %   Eosinophils Absolute 0.0 0.0 - 0.7 K/uL   Basophils Relative 0 %   Basophils Absolute 0.0 0.0 - 0.1 K/uL  Comprehensive metabolic panel  Result Value Ref Range   Sodium 138 135 -  145 mmol/L   Potassium 4.3 3.5 - 5.1 mmol/L   Chloride 112 (H) 101 - 111 mmol/L   CO2 20 (L) 22 - 32 mmol/L   Glucose, Bld 197 (H) 65 - 99 mg/dL   BUN 34 (H) 6 - 20 mg/dL   Creatinine, Ser 1.61 (H) 0.44 - 1.00 mg/dL   Calcium 9.0 8.9 - 09.6 mg/dL   Total Protein 6.0 (L) 6.5 - 8.1 g/dL   Albumin 3.6 3.5 - 5.0 g/dL   AST 21 15 - 41 U/L   ALT 9 (L) 14 - 54 U/L   Alkaline Phosphatase 85 38 - 126 U/L   Total Bilirubin 0.3 0.3 - 1.2 mg/dL   GFR calc non Af Amer 36 (L) >60 mL/min   GFR calc Af Amer 42 (L) >60 mL/min   Anion gap 6 5 - 15  I-Stat Chem 8, ED  Result Value Ref Range   Sodium 138 135 - 145 mmol/L   Potassium 4.3 3.5 - 5.1 mmol/L   Chloride 111 101 - 111 mmol/L   BUN 35 (H) 6 - 20 mg/dL   Creatinine, Ser 0.45 (H) 0.44 - 1.00 mg/dL   Glucose, Bld 409 (H) 65 - 99 mg/dL   Calcium, Ion 8.11 (L) 1.15 - 1.40 mmol/L   TCO2 14 0 - 100 mmol/L   Hemoglobin 8.8 (L) 12.0 - 15.0 g/dL   HCT 91.4 (L) 78.2 - 95.6 %  POC occult blood, ED Provider will collect  Result Value Ref Range   Fecal Occult Bld POSITIVE (A) NEGATIVE  Prepare RBC  Result Value Ref Range   Order Confirmation ORDER PROCESSED BY BLOOD BANK   Type and screen  Result Value Ref Range   ABO/RH(D) O POS    Antibody Screen NEG    Sample Expiration 09/15/2016    Unit Number [id number]    Blood Component Type RED CELLS,LR    Unit division 00    Status of Unit ALLOCATED    Transfusion Status OK TO TRANSFUSE    Crossmatch Result Compatible    Unit Number [id number]    Blood Component Type RED CELLS,LR    Unit division 00    Status of Unit ISSUED    Transfusion Status OK TO TRANSFUSE    Crossmatch Result Compatible    Dg Chest Portable 1 View  Result Date: 09/12/2016 CLINICAL DATA:  Rectal  bleeding since last night. Weakness. Shortness of breath. EXAM: PORTABLE CHEST 1 VIEW COMPARISON:  08/06/2015 FINDINGS: Unchanged mediastinal contours with thoracic aortic atherosclerosis. Chronic appearing increased interstitial opacities, suggesting emphysema. Calcified granuloma in the right midlung zone. No focal airspace disease, evidence of pulmonary edema, pleural effusion or pneumothorax. The bones are under mineralized. IMPRESSION: Chronic interstitial thickening.  No evidence of acute abnormality. Thoracic aortic atherosclerosis. Electronically Signed   By: Rubye Oaks M.D.   On: 09/12/2016 02:28   Medications  0.9 %  sodium chloride infusion (not administered)  pantoprazole (PROTONIX) 80 mg in sodium chloride 0.9 % 250 mL (0.32 mg/mL) infusion (8 mg/hr Intravenous New Bag/Given 09/12/16 0258)  pantoprazole (PROTONIX) injection 40 mg (not administered)  sodium chloride 0.9 % bolus 2,000 mL (0 mLs Intravenous Stopped 09/12/16 0253)  pantoprazole (PROTONIX) 80 mg in sodium chloride 0.9 % 100 mL IVPB (0 mg Intravenous Stopped 09/12/16 0214)  sodium chloride 0.9 % bolus 1,000 mL (1,000 mLs Intravenous New Bag/Given 09/12/16 0314)   MDM Reviewed: previous chart, nursing note and vitals Reviewed previous: labs Interpretation: labs and x-ray (anemia  by me elevated BUN CXR NACPD) Total time providing critical care: 75-105 minutes. This excludes time spent performing separately reportable procedures and services. Consults: admitting MD and gastrointestinal   No results found.  Procedures Procedures (including critical care time)  Medications Ordered in ED Medications - No data to display   Initial Impression / Assessment and Plan / ED Course  I have reviewed the triage vital signs and the nursing notes.  Pertinent labs & imaging results that were available during my care of the patient were reviewed by me and considered in my medical decision making (see chart for details).  IV  fluid rescusitation with 3 liters of NSS.  Blood initiated in the ED.  Protonix started in the ED given h/o of gastritis in case of very brisk UGIB.    CRITICAL CARE Performed by: Jasmine Awe Total critical care time: 75 minutes Critical care time was exclusive of separately billable procedures and treating other patients. Critical care was necessary to treat or prevent imminent or life-threatening deterioration. Critical care was time spent personally by me on the following activities: development of treatment plan with patient and/or surrogate as well as nursing, discussions with consultants, evaluation of patient's response to treatment, examination of patient, obtaining history from patient or surrogate, ordering and performing treatments and interventions, ordering and review of laboratory studies, ordering and review of radiographic studies, pulse oximetry and re-evaluation of patient's condition.  Final Clinical Impressions(s) / ED Diagnoses   GIB bleed admit to step down   New Prescriptions New Prescriptions   No medications on file    I personally performed the services described in this documentation, which was scribed in my presence. The recorded information has been reviewed and is accurate.       Cy Blamer, MD 09/12/16 1610    Tenasia Aull, MD 09/12/16 [phone]

## 2016-09-12 NOTE — Progress Notes (Signed)
RN paged triad NP about critical troponin of 0.07. No further orders at this time. Will continue to monitor

## 2016-09-12 NOTE — Progress Notes (Signed)
CRITICAL VALUE ALERT  Critical value received:  Troponin 0.06  Date of notification:  09/12/2016  Time of notification:  1700  Critical value read back:Yes.    Nurse who received alert:  Eddie Candleavid Kaysey Berndt RN  MD notified (1st page):  Dr. Cyndra NumbersSmith Elink  Time of first page:  1705 Elink  MD notified (2nd page): Dr. Cyndra NumbersSmith Elink Left message with RN to inform Dr. Katrinka BlazingSmith.  Time of second page: 1720  Responding MD:  Dr. Katrinka BlazingSmith  Time MD responded:  [phone]

## 2016-09-12 NOTE — Procedures (Signed)
Interventional Radiology Procedure Note  Procedure: 1.) Placement of a right IJ triple lumen central line.  Tip at cavoatria ljunction and ready for use. 2.) Mesenteric angiogram and embolization of actively bleeding branch of the ileocecal artery.  Vascular Access: Right CFA 17F --> ExoSeal  Complications: None  Estimated Blood Loss: Actively bleeding from cecum throughout angiography.  Likely several hundred mL lost before bleeding stopped.   Recommendations: - Pt had significant active bleeding.  Even though hemorrhage now stopped will likely continue to pass bloody stools until colon has evacuated completely. - Serial CBC and transfuse as needed - Bedrest x 4 hrs - Start clears, ADAT if ok with GI   Signed,  Sterling BigHeath K. McCullough, MD

## 2016-09-12 NOTE — Consult Note (Signed)
Name: Desiree Hutchinson MRN: [id number] DOB: 12/08/1920    ADMISSION DATE:  09/12/2016 CONSULTATION DATE:  09/12/2016  REFERRING MD :  Lindaann Pascal  CHIEF COMPLAINT:  Bleeding per rectum  HISTORY OF PRESENT ILLNESS:  82 year old woman with COPD and chronic diastolic heart failure presented 917 early a.m. with bright red blood per rectum and hypotension. NG lavage was clear. Nuclear scan confirmed active bleeding in the cecum and ascending colon. She was transfused 2 units of blood but remained hypotensive and was taken emergently for angiogram and the cecal area was embolized. She was transfused another unit of blood during the procedure. She required 3 mg of Versed for sedation. I was called emergently due to hypotension and possible need for pressors  She also has a medical history of hypertension, CK D and small bowel obstruction in 2016  SIGNIFICANT EVENTS    STUDIES:  Echo 11/2014 shows moderate AS, with good LV function     PAST MEDICAL HISTORY :   has a past medical history of Allergic rhinitis; Anemia; Aortic valve disease; Chest pain, non-cardiac; Chronic diastolic heart failure (HCC); COPD (chronic obstructive pulmonary disease) (HCC); Cough; Gastritis; History of atherosclerotic cardiovascular disease; History of small bowel obstruction; Hypertension; Left ventricular hypertrophy; Obesity; and Personal history of tobacco use, presenting hazards to health.  has a past surgical history that includes small bowel obstruction (06/1996); Esophagogastroduodenoscopy (11/09/1999); adenoasine cardiolite (11/09/2004); transthoracic cardiolite  (11/03/2006); Appendectomy; Hernia repair; Bowel resection (N/A, 08/10/2015); laparotomy (08/10/2015); and Lysis of adhesion (08/10/2015). Prior to Admission medications   Medication Sig Start Date End Date Taking? Authorizing Provider  acetaminophen (TYLENOL) 325 MG tablet Take 1-2 tablets (325-650 mg total) by mouth every 6 (six) hours as needed for  fever, headache, mild pain or moderate pain. 08/22/15  Yes Hollice Espy, MD  amLODipine (NORVASC) 5 MG tablet Take 5 mg by mouth daily. 11/07/15  Yes Historical Provider, MD  hydrALAZINE (APRESOLINE) 25 MG tablet TAKE 1 TABLET BY MOUTH 2 TIMES DAILY. 05/10/16  Yes Tonny Bollman, MD  Incontinence Supply Disposable (REALITY INCONTINENT BRIEFS SM) MISC 10 per day  R32 09/26/15  Yes [name], MD  lisinopril (PRINIVIL,ZESTRIL) 2.5 MG tablet TAKE 1 TABLET (2.5 MG TOTAL) BY MOUTH DAILY. 02/05/16  Yes Tonny Bollman, MD  polyethylene glycol powder (GLYCOLAX/MIRALAX) powder Take 17 g by mouth daily. Patient taking differently: Take 17 g by mouth daily as needed (for constipation).  07/23/15  Yes Renae Fickle, MD  carvedilol (COREG) 3.125 MG tablet Take 1 tablet (3.125 mg total) by mouth 2 (two) times daily with a meal. Patient not taking: Reported on 09/12/2016 08/22/15   Hollice Espy, MD  feeding supplement (BOOST / RESOURCE BREEZE) LIQD Take 1 Container by mouth 3 (three) times daily between meals. Patient not taking: Reported on 09/12/2016 11/12/15   [name], MD   No Known Allergies  FAMILY HISTORY:  family history is not on file. SOCIAL HISTORY:  reports that she quit smoking about 6 years ago. Her smoking use included Cigarettes. She has a 3.00 pack-year smoking history. She has never used smokeless tobacco. She reports that she does not drink alcohol or use drugs.  REVIEW OF SYSTEMS:   Unable to obtain currently as patient is sedated  SUBJECTIVE:   VITAL SIGNS: Temp:  [96.3 F (35.7 C)-97.6 F (36.4 C)] 96.3 F (35.7 C) (09/17 1047) Pulse Rate:  [70-89] 89 (09/17 1107) Resp:  [14-36] 31 (09/17 1107) BP: (65-119)/(32-70) 107/69 (09/17 1107) SpO2:  [99 %-100 %]  100 % (09/17 1107) Weight:  [44.1 kg (97 lb 3.6 oz)] 44.1 kg (97 lb 3.6 oz) (09/17 0515)  PHYSICAL EXAMINATION: Gen. Elderly, well-nourished, in no distress,  ENT - no lesions, no post nasal drip Neck: No JVD, no  thyromegaly, no carotid bruits Lungs: no use of accessory muscles, no dullness to percussion, clear without rales or rhonchi  Cardiovascular: Rhythm regular, heart sounds  normal, no murmurs or gallops, no peripheral edema Abdomen: soft and non-tender, no hepatosplenomegaly, BS normal. Musculoskeletal: No deformities, no cyanosis or clubbing Neuro:  Sedated after procedure, non focal    Recent Labs Lab 09/12/16 0137 09/12/16 0200  NA 138 138  K 4.3 4.3  CL 112* 111  CO2 20*  --   BUN 34* 35*  CREATININE 1.24* 1.30*  GLUCOSE 197* 191*    Recent Labs Lab 09/12/16 0137 09/12/16 0200  HGB 9.0* 8.8*  HCT 27.7* 26.0*  WBC 8.3  --   PLT 157  --    Nm Gi Blood Loss  Result Date: 09/12/2016 CLINICAL DATA:  Gastrointestinal bleeding. EXAM: NUCLEAR MEDICINE GASTROINTESTINAL BLEEDING SCAN TECHNIQUE: Sequential abdominal images were obtained following intravenous administration of Tc-1832m labeled red blood cells. RADIOPHARMACEUTICALS:  20.1 mCi Tc-6732m in-vitro labeled red cells. COMPARISON:  None. FINDINGS: Immediately upon imaging, tagged red blood cells localized to the RIGHT lower quadrant. More delayed imaging demonstrates tagged red blood cells advancing to the transverse colon and LEFT colon. IMPRESSION: Active gastrointestinal bleeding initiating in the cecum / ascending colon. Findings conveyed to Archer AsaMcCullough, MD on 09/12/2016  at09:39. Electronically Signed   By: Genevive BiStewart  Edmunds M.D.   On: 09/12/2016 09:40   Dg Chest Portable 1 View  Result Date: 09/12/2016 CLINICAL DATA:  Rectal bleeding since last night. Weakness. Shortness of breath. EXAM: PORTABLE CHEST 1 VIEW COMPARISON:  08/06/2015 FINDINGS: Unchanged mediastinal contours with thoracic aortic atherosclerosis. Chronic appearing increased interstitial opacities, suggesting emphysema. Calcified granuloma in the right midlung zone. No focal airspace disease, evidence of pulmonary edema, pleural effusion or pneumothorax. The bones are  under mineralized. IMPRESSION: Chronic interstitial thickening.  No evidence of acute abnormality. Thoracic aortic atherosclerosis. Electronically Signed   By: Rubye OaksMelanie  Ehinger M.D.   On: 09/12/2016 02:28    ASSESSMENT / PLAN: Lower GI bleeding possibly diverticular from the cecum status post embolization with good control of bleeding Hemorrhagic shock-resolved with blood transfusion  -Hemoglobin was 8.8 after 2 units of blood, she was transfused a third unit during procedure. Right IJ central access was placed by radiology and confirmed. We will obtain serial hemoglobin and transfuse more as needed There is no evidence of coagulopathy and platelets seem adequate  DO NOT RESUSCITATE noted, and should be reinstated now that procedure is completed, pressors not required at the current time  [facility] available as needed  Cyril Mourningakesh Sheza Strickland MD. FCCP. [facility] Pager [phone] If no response call [phone]   09/12/2016    09/12/2016, 11:37 AM

## 2016-09-12 NOTE — Sedation Documentation (Signed)
Gauze/tegaderm bandage applied to R femoral artery puncture site.  Groin unremarkable, drsg CDI,  2+RDP.

## 2016-09-12 NOTE — Consult Note (Signed)
HPI :  82 y/o female with history of CHF and COPD who presented to the ER this evening with large amount of blood per rectum. I was called to come into the hospital and see this patient urgently.   She has a reported history of suspected diverticular bleed in 2016, and a history of small bowel obstruction in 2016, who presented with hematochezia this evening. Daughter reports she noted her clothes were "soaked with blood" when she found her in her bed. The patient denies any abdominal pain, no vomiting. She has passed multiple large volume of red blood and clots per nursing staff. She has had some hypotension but HR stable (on beta blocker). No anticoagulants.   I evaluated the patient in the ICU and performed an NG lavage which returned only clear, gastric secretions without blood. She has continued to passed large amounts of blood per rectum in the ICU and receiving her 2nd unit of PRBC transfusion at this time. Hgb on admission was 9. GFR of 42 noted.  Last colonoscopy reported to be around 2006 showing diverticulosis.  Past Medical History:  Diagnosis Date  . Allergic rhinitis   . Anemia   . Aortic valve disease    regurge > stenosis  . Chest pain, non-cardiac   . Chronic diastolic heart failure (HCC)   . COPD (chronic obstructive pulmonary disease) (HCC)   . Cough   . Gastritis   . History of atherosclerotic cardiovascular disease   . History of small bowel obstruction   . Hypertension   . Left ventricular hypertrophy   . Obesity   . Personal history of tobacco use, presenting hazards to health      Past Surgical History:  Procedure Laterality Date  . adenoasine cardiolite  11/09/2004  . APPENDECTOMY    . BOWEL RESECTION N/A 08/10/2015   Procedure: SMALL BOWEL OBSTRUCTION;  Surgeon: Glenna Fellows, MD;  Location: [facility];  Service: General;  Laterality: N/A;  . ESOPHAGOGASTRODUODENOSCOPY  11/09/1999  . HERNIA REPAIR    . LAPAROTOMY  08/10/2015   Procedure: EXPLORATORY  LAPAROTOMY;  Surgeon: Glenna Fellows, MD;  Location: [facility];  Service: General;;  . LYSIS OF ADHESION  08/10/2015   Procedure: LYSIS OF ADHESION;  Surgeon: Glenna Fellows, MD;  Location: [facility];  Service: General;;  . small bowel obstruction  06/1996  . transthoracic cardiolite   11/03/2006   History reviewed. No pertinent family history. Social History  Substance Use Topics  . Smoking status: Former Smoker    Packs/day: 0.10    Years: 30.00    Types: Cigarettes    Quit date: 01/24/2010  . Smokeless tobacco: Never Used     Comment: smoked 1-2 cigarettes/day  . Alcohol use No   Current Facility-Administered Medications  Medication Dose Route Frequency Provider Last Rate Last Dose  . 0.9 %  sodium chloride infusion  10 mL/hr Intravenous Once April Palumbo, MD      . albuterol (PROVENTIL) (2.5 MG/3ML) 0.083% nebulizer solution 2.5 mg  2.5 mg Nebulization Q6H PRN Jackie Plum, MD      . pantoprazole (PROTONIX) 80 mg in sodium chloride 0.9 % 250 mL (0.32 mg/mL) infusion  8 mg/hr Intravenous Continuous April Palumbo, MD 25 mL/hr at 09/12/16 0258 8 mg/hr at 09/12/16 0258  . [START ON 09/15/2016] pantoprazole (PROTONIX) injection 40 mg  40 mg Intravenous Q12H April Palumbo, MD      . tiotropium [facility]) inhalation capsule 18 mcg  18 mcg Inhalation Daily Jackie Plum, MD  No Known Allergies   Review of Systems: All systems reviewed and negative except where noted in HPI.    Dg Chest Portable 1 View  Result Date: 09/12/2016 CLINICAL DATA:  Rectal bleeding since last night. Weakness. Shortness of breath. EXAM: PORTABLE CHEST 1 VIEW COMPARISON:  08/06/2015 FINDINGS: Unchanged mediastinal contours with thoracic aortic atherosclerosis. Chronic appearing increased interstitial opacities, suggesting emphysema. Calcified granuloma in the right midlung zone. No focal airspace disease, evidence of pulmonary edema, pleural effusion or pneumothorax. The bones are under mineralized.  IMPRESSION: Chronic interstitial thickening.  No evidence of acute abnormality. Thoracic aortic atherosclerosis. Electronically Signed   By: Rubye OaksMelanie  Ehinger M.D.   On: 09/12/2016 02:28    Physical Exam: BP (!) 75/32   Pulse 80   Temp (!) 96.9 F (36.1 C) (Axillary)   Resp (!) 22   SpO2 100%  Constitutional: Pleasant, thin female in no acute distress. HEENT: Normocephalic and atraumatic. Conjunctivae are pale. No scleral icterus. Neck supple.  Cardiovascular: Normal rate, regular rhythm.  Pulmonary/chest: Effort normal and breath sounds normal.  Abdominal: Soft, nondistended, nontender. There are no masses palpable. Extremities: no edema Lymphadenopathy: No cervical adenopathy noted. Neurological: Alert and oriented to person place and time. Skin: Skin is warm and dry. No rashes noted. Psychiatric: Normal mood and affect. Behavior is normal.   ASSESSMENT AND PLAN: 82 y/o female presenting with what is most likely a lower GI bleed. Since arrival to the ED she has passed a significant amount of blood, associated with some hypotension. I was asked to see her urgently this evening. I performed an NG lavage which returned clear gastric secretions. I suspect she is likely having a lower GI bleed, probably due to diverticulosis, given her presentation and history of diverticular bleeding.   I discussed options with the family and patient, and further discussed her case with Dr. Archer AsaMcCullough of IR. CT angiography would be the preferred initial test in this situation but her GFR is in the 40s, putting her at risk for contrast related renal injury if she needs embolization. In this light we discussed starting with a tagged RBC scan to help localize source, and if positive for colonic bleed as suspected, IR would proceed with embolization. I think an urgent unprepped colonoscopy in the setting of the amount of blood / clots she is passing at present time would be of lower yield.  In the interim,  continue PRBC transfusion as needed, serial H/H.  Patient and family were in agreement with plan, all questions answered. Will await results of tagged RBC scan.  Ileene PatrickSteven Arvie Bartholomew, MD [facility] Pager [phone]

## 2016-09-12 NOTE — Progress Notes (Signed)
Patient ID: Desiree Hutchinson, female   DOB: 10/12/1920, 82 y.o.   MRN: [id number] Patient admitted after midnight. For details please refer to admission note done 09/12/2016.  82 -year-old female with past medical history significant for COPD, CHF. [facility] with large amount of blood per rectum. She had a suspected diverticular bleed in 2016, history of small bowel obstruction.  In ED, BP was 65/33, HR 83, RR 22, T 96.5 F and oxygen saturation 99%. Blood work was notable for hemoglobin of 9, BUN 34, creatinine 1.24. GI has seen in consultation. NG lavage returned clear gastric secretions without the blood so suspected lower GI bleed. She has received 2 units of PRBC transfusion so far. She is being seen by IR and getting another unit of blood. Critical care will see in consultation for possible pressor support.  Assessment and plan:  Acute lower GI bleed / hemorrhagic shock - Patient hypotensive with blood pressure as low as 65/33 in the setting of acute lower GI bleed - Appreciate critical care consult for pressor support. Patient is DO NOT RESUSCITATE but per family report temporarily reversed DO NOT RESUSCITATE so she could receive pressor support if this problem is temporarily reversible - Appreciate IR recommendations - Per GI, plan for tagged red blood cells and possible embolization - Continue supportive care with blood transfusion  Manson Passeylma Devine [facility] [phone]

## 2016-09-12 NOTE — Progress Notes (Signed)
Patient necklace taken off during IR procedure earlier today. Necklace return to patient and she is currently wearing it.  Gari CrownAshley Makendra Vigeant, RN

## 2016-09-12 NOTE — Sedation Documentation (Signed)
5 Fr sheath removed from R femoral artery by Dr. McCullough. Hemostasis achieved using Exoseal closure device. Groin level 0, 2+RDP, 1+RPT.  

## 2016-09-12 NOTE — ED Triage Notes (Signed)
Patient c/o rectal bleeding that began last night.  Patient states that it was "a lot" of blood was not able to give a measurement.   Patient states blood is bright red.  Patient states that she feels week and asked family member to bring her to the hospital.  Patient denies pain.

## 2016-09-12 NOTE — Progress Notes (Signed)
Pt had not urinated all shift.  Bladder scan showed <200cc of urine.  [facility]  In/Out cath ordered.  Erick Blinksuchman, Valree Feild D, RN

## 2016-09-12 NOTE — Progress Notes (Signed)
Pt was resting when I arrived. Her grandson and great granddaughter were bedside. Most of my visit was w/family as pt only awakened to say few words and greet me during the visit. Please page if additional support is needed. Chaplain Marjory Liesamela Carrington Holder, M.Div.  [phone]   09/12/16 1700  Clinical Encounter Type  Visited With Patient and family together

## 2016-09-12 NOTE — Progress Notes (Signed)
Critical lab value troponin 0.06 received from bedside RN David and relayeOnalee Huad to Dr. Katrinka BlazingSmith.

## 2016-09-13 ENCOUNTER — Encounter (HOSPITAL_COMMUNITY): Payer: Self-pay | Admitting: Interventional Radiology

## 2016-09-13 DIAGNOSIS — K921 Melena: Secondary | ICD-10-CM

## 2016-09-13 DIAGNOSIS — D72819 Decreased white blood cell count, unspecified: Secondary | ICD-10-CM

## 2016-09-13 DIAGNOSIS — D62 Acute posthemorrhagic anemia: Secondary | ICD-10-CM

## 2016-09-13 DIAGNOSIS — D696 Thrombocytopenia, unspecified: Secondary | ICD-10-CM

## 2016-09-13 DIAGNOSIS — K5731 Diverticulosis of large intestine without perforation or abscess with bleeding: Principal | ICD-10-CM

## 2016-09-13 LAB — PREPARE RBC (CROSSMATCH)

## 2016-09-13 LAB — HEMOGLOBIN AND HEMATOCRIT, BLOOD
HCT: 23.3 % — ABNORMAL LOW (ref 36.0–46.0)
HCT: 20.4 % — ABNORMAL LOW (ref 36.0–46.0)
HCT: 19.1 % — ABNORMAL LOW (ref 36.0–46.0)
Hemoglobin: 8.3 g/dL — ABNORMAL LOW (ref 12.0–15.0)
Hemoglobin: 7.2 g/dL — ABNORMAL LOW (ref 12.0–15.0)
Hemoglobin: 6.7 g/dL — CL (ref 12.0–15.0)

## 2016-09-13 LAB — BASIC METABOLIC PANEL
Anion gap: 2 — ABNORMAL LOW (ref 5–15)
BUN: 26 mg/dL — ABNORMAL HIGH (ref 6–20)
CO2: 14 mmol/L — ABNORMAL LOW (ref 22–32)
Calcium: 7.2 mg/dL — ABNORMAL LOW (ref 8.9–10.3)
Chloride: 124 mmol/L — ABNORMAL HIGH (ref 101–111)
Creatinine, Ser: 1.31 mg/dL — ABNORMAL HIGH (ref 0.44–1.00)
GFR calc Af Amer: 39 mL/min — ABNORMAL LOW (ref >60)
GFR calc non Af Amer: 34 mL/min — ABNORMAL LOW (ref >60)
Glucose, Bld: 106 mg/dL — ABNORMAL HIGH (ref 65–99)
Potassium: 4 mmol/L (ref 3.5–5.1)
Sodium: 140 mmol/L (ref 135–145)

## 2016-09-13 LAB — CBC WITH DIFFERENTIAL/PLATELET
Basophils Absolute: 0 10*3/uL (ref 0.0–0.1)
Basophils Relative: 0 %
Eosinophils Absolute: 0 10*3/uL (ref 0.0–0.7)
Eosinophils Relative: 0 %
HCT: 24.7 % — ABNORMAL LOW (ref 36.0–46.0)
Hemoglobin: 8.5 g/dL — ABNORMAL LOW (ref 12.0–15.0)
Lymphocytes Relative: 5 %
Lymphs Abs: 1.1 10*3/uL (ref 0.7–4.0)
MCH: 30.8 pg (ref 26.0–34.0)
MCHC: 34.4 g/dL (ref 30.0–36.0)
MCV: 89.5 fL (ref 78.0–100.0)
Monocytes Absolute: 0.9 10*3/uL (ref 0.1–1.0)
Monocytes Relative: 4 %
Neutro Abs: 20.8 10*3/uL — ABNORMAL HIGH (ref 1.7–7.7)
Neutrophils Relative %: 91 %
Platelets: 49 10*3/uL — ABNORMAL LOW (ref 150–400)
RBC: 2.76 MIL/uL — ABNORMAL LOW (ref 3.87–5.11)
RDW: 15.4 % (ref 11.5–15.5)
WBC: 22.8 10*3/uL — ABNORMAL HIGH (ref 4.0–10.5)

## 2016-09-13 LAB — TROPONIN I: Troponin I: 0.07 ng/mL (ref <0.03)

## 2016-09-13 LAB — CORTISOL: Cortisol, Plasma: 77 ug/dL

## 2016-09-13 MED ORDER — HYDROCODONE-ACETAMINOPHEN 5-325 MG PO TABS
1.0000 | ORAL_TABLET | ORAL | Status: DC | PRN
  Administered 2016-09-13 – 2016-09-15 (×5): 1 via ORAL
  Filled 2016-09-13 (×5): qty 1

## 2016-09-13 MED ORDER — SODIUM CHLORIDE 0.9 % IV SOLN
Freq: Once | INTRAVENOUS | Status: AC
  Administered 2016-09-13: 23:00:00 via INTRAVENOUS

## 2016-09-13 NOTE — Progress Notes (Signed)
Referring Physician(s): Devine,A  Supervising Physician: Jolaine Click  Patient Status:  Inpatient  Chief Complaint:  Lower gastrointestinal bleed  Subjective: Patient still feels weak; denies worsening abdominal pain ,nausea or vomiting. Has not had BM since coiling procedure yesterday.   Allergies: Review of patient's allergies indicates no known allergies.  Medications: Prior to Admission medications   Medication Sig Start Date End Date Taking? Authorizing Provider  acetaminophen (TYLENOL) 325 MG tablet Take 1-2 tablets (325-650 mg total) by mouth every 6 (six) hours as needed for fever, headache, mild pain or moderate pain. 08/22/15  Yes Hollice Espy, MD  amLODipine (NORVASC) 5 MG tablet Take 5 mg by mouth daily. 11/07/15  Yes Historical Provider, MD  hydrALAZINE (APRESOLINE) 25 MG tablet TAKE 1 TABLET BY MOUTH 2 TIMES DAILY. 05/10/16  Yes Tonny Bollman, MD  Incontinence Supply Disposable (REALITY INCONTINENT BRIEFS SM) MISC 10 per day  R32 09/26/15  Yes [name], MD  lisinopril (PRINIVIL,ZESTRIL) 2.5 MG tablet TAKE 1 TABLET (2.5 MG TOTAL) BY MOUTH DAILY. 02/05/16  Yes Tonny Bollman, MD  polyethylene glycol powder (GLYCOLAX/MIRALAX) powder Take 17 g by mouth daily. Patient taking differently: Take 17 g by mouth daily as needed (for constipation).  07/23/15  Yes Renae Fickle, MD  carvedilol (COREG) 3.125 MG tablet Take 1 tablet (3.125 mg total) by mouth 2 (two) times daily with a meal. Patient not taking: Reported on 09/12/2016 08/22/15   Hollice Espy, MD  feeding supplement (BOOST / RESOURCE BREEZE) LIQD Take 1 Container by mouth 3 (three) times daily between meals. Patient not taking: Reported on 09/12/2016 11/12/15   [name], MD     Vital Signs: BP (!) 106/37   Pulse 83   Temp 98.5 F (36.9 C) (Oral)   Resp 20   Ht 5' (1.524 m)   Wt 97 lb 3.6 oz (44.1 kg)   SpO2 100%   BMI 18.99 kg/m   Physical Exam awake/alert; abd soft,+BS,NT; puncture site  right common femoral artery soft, clean, nontender, no hematoma. Intact distal pulses.  Imaging: Nm Gi Blood Loss  Result Date: 09/12/2016 CLINICAL DATA:  Gastrointestinal bleeding. EXAM: NUCLEAR MEDICINE GASTROINTESTINAL BLEEDING SCAN TECHNIQUE: Sequential abdominal images were obtained following intravenous administration of Tc-35m labeled red blood cells. RADIOPHARMACEUTICALS:  20.1 mCi Tc-50m in-vitro labeled red cells. COMPARISON:  None. FINDINGS: Immediately upon imaging, tagged red blood cells localized to the RIGHT lower quadrant. More delayed imaging demonstrates tagged red blood cells advancing to the transverse colon and LEFT colon. IMPRESSION: Active gastrointestinal bleeding initiating in the cecum / ascending colon. Findings conveyed to Archer Asa, MD on 09/12/2016  at09:39. Electronically Signed   By: Genevive Bi M.D.   On: 09/12/2016 09:40   Ir Angiogram Visceral Selective  Result Date: 09/12/2016 INDICATION: 82 year old female with large volume active lower GI bleed requiring transfusion. Nuclear medicine tagged red blood cell study localizes the bleed to the region of the cecum/ascending colon. Additionally, she has hypotension not responding to fluid resuscitation. She requires more blood products. We will place an emergent central line, administer more blood and perform visceral angiography with embolization if the bleeding source can be identified. EXAM: SELECTIVE VISCERAL ARTERIOGRAPHY; IR RIGHT FLOURO GUIDE CV LINE; IR ULTRASOUND GUIDANCE VASC ACCESS RIGHT; ADDITIONAL ARTERIOGRAPHY; IR EMBO ART VEN HEMORR LYMPH EXTRAV INC GUIDE ROADMAPPING 1. Ultrasound-guided access right internal jugular vein 2. Placement of a right IJ central venous catheter 3. Ultrasound-guided access right common femoral artery 4. Catheterization superior mesenteric artery with arteriogram 5.  Catheterization middle colic artery with arteriogram 6. Catheterization right branch of the middle colic artery with  arteriogram 7. Catheterization marginal artery with arteriogram 8. Catheterization of an un-named ascending colonic branch artery with arteriogram 9. Coil embolization of unnamed ascending colonic branch artery 10. Catheterization of the right colic artery with arteriogram Interventional Radiologist:  Sterling Big, MD MEDICATIONS: 1 unit packed red blood cells administered during the course of the procedure. ANESTHESIA/SEDATION: Fentanyl 0 mcg IV; Versed 3 mg IV Moderate Sedation Time:  59 minutes The patient was continuously monitored during the procedure by the interventional radiology nurse under my direct supervision. CONTRAST:  25mL ISOVUE-300 IOPAMIDOL (ISOVUE-300) INJECTION 61%, 65mL ISOVUE-300 IOPAMIDOL (ISOVUE-300) INJECTION 61% FLUOROSCOPY TIME:  Fluoroscopy Time: 16 minutes 30 seconds (777 mGy). COMPLICATIONS: None immediate. PROCEDURE: Informed consent was obtained from the patient following explanation of the procedure, risks, benefits and alternatives. The patient understands, agrees and consents for the procedure. All questions were addressed. A time out was performed. Maximal barrier sterile technique utilized including caps, mask, sterile gowns, sterile gloves, large sterile drape, hand hygiene, and chlorhexidine skin prep. Attention was first turned to the right neck to establish central venous access. The right internal jugular vein was interrogated with ultrasound and found to be widely patent. An image was obtained and stored for the medical record. Local anesthesia was attained by infiltration with 1% lidocaine. A small dermatotomy was made. Under real-time sonographic guidance, the vessel was punctured with an 18 gauge needle. Using standard technique, the needle was exchanged over a 0.013 inch wire and the skin tract was dilated. A then, an Arrow triple-lumen catheter was advanced over the wire and position with the tip at the cavoatrial junction. The catheter flushed and aspirated  with ease. It was flushed, capped and secured to the skin with Prolene suture. A sterile bandage was applied. Attention was then turned to the right groin. The right common femoral artery was interrogated with ultrasound and found to be widely patent. An image was obtained and stored for the medical record. Local anesthesia was attained by infiltration with 1% lidocaine. A small dermatotomy was made. Under real-time sonographic guidance, the vessel was punctured with a 21 gauge micropuncture needle. Using standard technique, the initial micro needle was exchanged over a 0.018 micro wire for a transitional 4 [place] micro sheath. The micro sheath was then exchanged over a 0.035 wire for a working 5 [place] vascular sheath. A C2 cobra catheter was then advanced over the Bentson wire into the abdominal aorta. The catheter was used to select the superior mesenteric artery. A superior mesenteric arteriogram was performed. There is obvious active extravasation occurring in the proximal to mid ascending colon from a visible branch artery arising from the marginal artery. A renegade STC micro catheter was then advanced over a Fathom wire and used to select the middle colic artery. An arteriogram was performed confirming the vascular anatomy. The micro catheter was then navigated into the right branch of the middle colic artery. Arteriogram was again performed confirming that this vessel is ultimately contiguous with the site of active bleeding. The micro catheter was then advanced into the marginal artery and arteriogram was performed. Finally, the micro catheter was advanced into the unnamed ascending colonic branch artery. Contrast injection confirms rapid active bleeding into the ascending colon. Coil embolization was performed using a combination of 2 x 40 mm fibered and soft interlock detachable coils. Post embolization arteriogram demonstrates successful embolization. There is trace revascularization of the distal most  aspect of the bleeding artery secondary to collateral per fusion but this is minimal and only visible during high-pressure injection. To assess for other treatable sources of collateral supply, the micro catheter was brought back into the superior mesenteric artery and advanced into the right colic artery. Arteriography was performed. No evidence of active extravasation from any of the distal branches of the right colic artery. The catheters were removed. A limited right common femoral arteriogram was performed confirming common femoral arterial access. Hemostasis was attained with the assistance of a Cordis Exoseal extra arterial vascular plug. IMPRESSION: 1. Successful placement of a right IJ approach triple-lumen central venous catheter. The catheter tip is at the cavoatrial junction and ready for use. 2. Angiography demonstrates active brisk bleeding from a branch of the marginal artery in the proximal to mid ascending colon. 3. Successful coil embolization of the actively bleeding artery. Signed, Sterling Big, MD [facility] Radiology Electronically Signed   By: Malachy Moan M.D.   On: 09/12/2016 12:28   Ir Angiogram Selective Each Additional Vessel  Result Date: 09/13/2016 INDICATION: 82 year old female with large volume active lower GI bleed requiring transfusion. Nuclear medicine tagged red blood cell study localizes the bleed to the region of the cecum/ascending colon. Additionally, she has hypotension not responding to fluid resuscitation. She requires more blood products. We will place an emergent central line, administer more blood and perform visceral angiography with embolization if the bleeding source can be identified. EXAM: SELECTIVE VISCERAL ARTERIOGRAPHY; IR RIGHT FLOURO GUIDE CV LINE; IR ULTRASOUND GUIDANCE VASC ACCESS RIGHT; ADDITIONAL ARTERIOGRAPHY; IR EMBO ART VEN HEMORR LYMPH EXTRAV INC GUIDE ROADMAPPING 1. Ultrasound-guided  access right internal jugular vein 2. Placement of a right IJ central venous catheter 3. Ultrasound-guided access right common femoral artery 4. Catheterization superior mesenteric artery with arteriogram 5. Catheterization middle colic artery with arteriogram 6. Catheterization right branch of the middle colic artery with arteriogram 7. Catheterization marginal artery with arteriogram 8. Catheterization of an un-named ascending colonic branch artery with arteriogram 9. Coil embolization of unnamed ascending colonic branch artery 10. Catheterization of the right colic artery with arteriogram Interventional Radiologist:  Sterling Big, MD MEDICATIONS: 1 unit packed red blood cells administered during the course of the procedure. ANESTHESIA/SEDATION: Fentanyl 0 mcg IV; Versed 3 mg IV Moderate Sedation Time:  59 minutes The patient was continuously monitored during the procedure by the interventional radiology nurse under my direct supervision. CONTRAST:  25mL ISOVUE-300 IOPAMIDOL (ISOVUE-300) INJECTION 61%, 65mL ISOVUE-300 IOPAMIDOL (ISOVUE-300) INJECTION 61% FLUOROSCOPY TIME:  Fluoroscopy Time: 16 minutes 30 seconds (777 mGy). COMPLICATIONS: None immediate. PROCEDURE: Informed consent was obtained from the patient following explanation of the procedure, risks, benefits and alternatives. The patient understands, agrees and consents for the procedure. All questions were addressed. A time out was performed. Maximal barrier sterile technique utilized including caps, mask, sterile gowns, sterile gloves, large sterile drape, hand hygiene, and chlorhexidine skin prep. Attention was first turned to the right neck to establish central venous access. The right internal jugular vein was interrogated with ultrasound and found to be widely patent. An image was obtained and stored for the medical record. Local anesthesia was attained by infiltration with 1% lidocaine. A small dermatotomy was made. Under real-time sonographic  guidance, the vessel was punctured with an 18 gauge needle. Using standard technique, the needle was exchanged over a 0.013 inch wire and the skin tract was dilated. A then, an Arrow triple-lumen catheter was advanced over the wire and  position with the tip at the cavoatrial junction. The catheter flushed and aspirated with ease. It was flushed, capped and secured to the skin with Prolene suture. A sterile bandage was applied. Attention was then turned to the right groin. The right common femoral artery was interrogated with ultrasound and found to be widely patent. An image was obtained and stored for the medical record. Local anesthesia was attained by infiltration with 1% lidocaine. A small dermatotomy was made. Under real-time sonographic guidance, the vessel was punctured with a 21 gauge micropuncture needle. Using standard technique, the initial micro needle was exchanged over a 0.018 micro wire for a transitional 4 [place] micro sheath. The micro sheath was then exchanged over a 0.035 wire for a working 5 [place] vascular sheath. A C2 cobra catheter was then advanced over the Bentson wire into the abdominal aorta. The catheter was used to select the superior mesenteric artery. A superior mesenteric arteriogram was performed. There is obvious active extravasation occurring in the proximal to mid ascending colon from a visible branch artery arising from the marginal artery. A renegade STC micro catheter was then advanced over a Fathom wire and used to select the middle colic artery. An arteriogram was performed confirming the vascular anatomy. The micro catheter was then navigated into the right branch of the middle colic artery. Arteriogram was again performed confirming that this vessel is ultimately contiguous with the site of active bleeding. The micro catheter was then advanced into the marginal artery and arteriogram was performed. Finally, the micro catheter was advanced into the unnamed ascending colonic  branch artery. Contrast injection confirms rapid active bleeding into the ascending colon. Coil embolization was performed using a combination of 2 x 40 mm fibered and soft interlock detachable coils. Post embolization arteriogram demonstrates successful embolization. There is trace revascularization of the distal most aspect of the bleeding artery secondary to collateral per fusion but this is minimal and only visible during high-pressure injection. To assess for other treatable sources of collateral supply, the micro catheter was brought back into the superior mesenteric artery and advanced into the right colic artery. Arteriography was performed. No evidence of active extravasation from any of the distal branches of the right colic artery. The catheters were removed. A limited right common femoral arteriogram was performed confirming common femoral arterial access. Hemostasis was attained with the assistance of a Cordis Exoseal extra arterial vascular plug. IMPRESSION: 1. Successful placement of a right IJ approach triple-lumen central venous catheter. The catheter tip is at the cavoatrial junction and ready for use. 2. Angiography demonstrates active brisk bleeding from a branch of the marginal artery in the proximal to mid ascending colon. 3. Successful coil embolization of the actively bleeding artery. Signed, Sterling Big, MD [facility] Radiology Electronically Signed   By: Malachy Moan M.D.   On: 09/12/2016 12:28   Ir Angiogram Selective Each Additional Vessel  Result Date: 09/12/2016 INDICATION: 82 year old female with large volume active lower GI bleed requiring transfusion. Nuclear medicine tagged red blood cell study localizes the bleed to the region of the cecum/ascending colon. Additionally, she has hypotension not responding to fluid resuscitation. She requires more blood products. We will place an emergent central line, administer more  blood and perform visceral angiography with embolization if the bleeding source can be identified. EXAM: SELECTIVE VISCERAL ARTERIOGRAPHY; IR RIGHT FLOURO GUIDE CV LINE; IR ULTRASOUND GUIDANCE VASC ACCESS RIGHT; ADDITIONAL ARTERIOGRAPHY; IR EMBO ART VEN HEMORR LYMPH EXTRAV INC GUIDE ROADMAPPING 1.  Ultrasound-guided access right internal jugular vein 2. Placement of a right IJ central venous catheter 3. Ultrasound-guided access right common femoral artery 4. Catheterization superior mesenteric artery with arteriogram 5. Catheterization middle colic artery with arteriogram 6. Catheterization right branch of the middle colic artery with arteriogram 7. Catheterization marginal artery with arteriogram 8. Catheterization of an un-named ascending colonic branch artery with arteriogram 9. Coil embolization of unnamed ascending colonic branch artery 10. Catheterization of the right colic artery with arteriogram Interventional Radiologist:  Sterling Big, MD MEDICATIONS: 1 unit packed red blood cells administered during the course of the procedure. ANESTHESIA/SEDATION: Fentanyl 0 mcg IV; Versed 3 mg IV Moderate Sedation Time:  59 minutes The patient was continuously monitored during the procedure by the interventional radiology nurse under my direct supervision. CONTRAST:  25mL ISOVUE-300 IOPAMIDOL (ISOVUE-300) INJECTION 61%, 65mL ISOVUE-300 IOPAMIDOL (ISOVUE-300) INJECTION 61% FLUOROSCOPY TIME:  Fluoroscopy Time: 16 minutes 30 seconds (777 mGy). COMPLICATIONS: None immediate. PROCEDURE: Informed consent was obtained from the patient following explanation of the procedure, risks, benefits and alternatives. The patient understands, agrees and consents for the procedure. All questions were addressed. A time out was performed. Maximal barrier sterile technique utilized including caps, mask, sterile gowns, sterile gloves, large sterile drape, hand hygiene, and chlorhexidine skin prep. Attention was first turned to the right  neck to establish central venous access. The right internal jugular vein was interrogated with ultrasound and found to be widely patent. An image was obtained and stored for the medical record. Local anesthesia was attained by infiltration with 1% lidocaine. A small dermatotomy was made. Under real-time sonographic guidance, the vessel was punctured with an 18 gauge needle. Using standard technique, the needle was exchanged over a 0.013 inch wire and the skin tract was dilated. A then, an Arrow triple-lumen catheter was advanced over the wire and position with the tip at the cavoatrial junction. The catheter flushed and aspirated with ease. It was flushed, capped and secured to the skin with Prolene suture. A sterile bandage was applied. Attention was then turned to the right groin. The right common femoral artery was interrogated with ultrasound and found to be widely patent. An image was obtained and stored for the medical record. Local anesthesia was attained by infiltration with 1% lidocaine. A small dermatotomy was made. Under real-time sonographic guidance, the vessel was punctured with a 21 gauge micropuncture needle. Using standard technique, the initial micro needle was exchanged over a 0.018 micro wire for a transitional 4 [place] micro sheath. The micro sheath was then exchanged over a 0.035 wire for a working 5 [place] vascular sheath. A C2 cobra catheter was then advanced over the Bentson wire into the abdominal aorta. The catheter was used to select the superior mesenteric artery. A superior mesenteric arteriogram was performed. There is obvious active extravasation occurring in the proximal to mid ascending colon from a visible branch artery arising from the marginal artery. A renegade STC micro catheter was then advanced over a Fathom wire and used to select the middle colic artery. An arteriogram was performed confirming the vascular anatomy. The micro catheter was then navigated into the right branch  of the middle colic artery. Arteriogram was again performed confirming that this vessel is ultimately contiguous with the site of active bleeding. The micro catheter was then advanced into the marginal artery and arteriogram was performed. Finally, the micro catheter was advanced into the unnamed ascending colonic branch artery. Contrast injection confirms rapid active bleeding into the ascending colon. Coil embolization  was performed using a combination of 2 x 40 mm fibered and soft interlock detachable coils. Post embolization arteriogram demonstrates successful embolization. There is trace revascularization of the distal most aspect of the bleeding artery secondary to collateral per fusion but this is minimal and only visible during high-pressure injection. To assess for other treatable sources of collateral supply, the micro catheter was brought back into the superior mesenteric artery and advanced into the right colic artery. Arteriography was performed. No evidence of active extravasation from any of the distal branches of the right colic artery. The catheters were removed. A limited right common femoral arteriogram was performed confirming common femoral arterial access. Hemostasis was attained with the assistance of a Cordis Exoseal extra arterial vascular plug. IMPRESSION: 1. Successful placement of a right IJ approach triple-lumen central venous catheter. The catheter tip is at the cavoatrial junction and ready for use. 2. Angiography demonstrates active brisk bleeding from a branch of the marginal artery in the proximal to mid ascending colon. 3. Successful coil embolization of the actively bleeding artery. Signed, Sterling Big, MD [facility] Radiology Electronically Signed   By: Malachy Moan M.D.   On: 09/12/2016 12:28   Ir Angiogram Selective Each Additional Vessel  Result Date: 09/12/2016 INDICATION: 82 year old female with large volume  active lower GI bleed requiring transfusion. Nuclear medicine tagged red blood cell study localizes the bleed to the region of the cecum/ascending colon. Additionally, she has hypotension not responding to fluid resuscitation. She requires more blood products. We will place an emergent central line, administer more blood and perform visceral angiography with embolization if the bleeding source can be identified. EXAM: SELECTIVE VISCERAL ARTERIOGRAPHY; IR RIGHT FLOURO GUIDE CV LINE; IR ULTRASOUND GUIDANCE VASC ACCESS RIGHT; ADDITIONAL ARTERIOGRAPHY; IR EMBO ART VEN HEMORR LYMPH EXTRAV INC GUIDE ROADMAPPING 1. Ultrasound-guided access right internal jugular vein 2. Placement of a right IJ central venous catheter 3. Ultrasound-guided access right common femoral artery 4. Catheterization superior mesenteric artery with arteriogram 5. Catheterization middle colic artery with arteriogram 6. Catheterization right branch of the middle colic artery with arteriogram 7. Catheterization marginal artery with arteriogram 8. Catheterization of an un-named ascending colonic branch artery with arteriogram 9. Coil embolization of unnamed ascending colonic branch artery 10. Catheterization of the right colic artery with arteriogram Interventional Radiologist:  Sterling Big, MD MEDICATIONS: 1 unit packed red blood cells administered during the course of the procedure. ANESTHESIA/SEDATION: Fentanyl 0 mcg IV; Versed 3 mg IV Moderate Sedation Time:  59 minutes The patient was continuously monitored during the procedure by the interventional radiology nurse under my direct supervision. CONTRAST:  25mL ISOVUE-300 IOPAMIDOL (ISOVUE-300) INJECTION 61%, 65mL ISOVUE-300 IOPAMIDOL (ISOVUE-300) INJECTION 61% FLUOROSCOPY TIME:  Fluoroscopy Time: 16 minutes 30 seconds (777 mGy). COMPLICATIONS: None immediate. PROCEDURE: Informed consent was obtained from the patient following explanation of the procedure, risks, benefits and alternatives.  The patient understands, agrees and consents for the procedure. All questions were addressed. A time out was performed. Maximal barrier sterile technique utilized including caps, mask, sterile gowns, sterile gloves, large sterile drape, hand hygiene, and chlorhexidine skin prep. Attention was first turned to the right neck to establish central venous access. The right internal jugular vein was interrogated with ultrasound and found to be widely patent. An image was obtained and stored for the medical record. Local anesthesia was attained by infiltration with 1% lidocaine. A small dermatotomy was made. Under real-time sonographic guidance, the vessel was punctured with an 18 gauge needle.  Using standard technique, the needle was exchanged over a 0.013 inch wire and the skin tract was dilated. A then, an Arrow triple-lumen catheter was advanced over the wire and position with the tip at the cavoatrial junction. The catheter flushed and aspirated with ease. It was flushed, capped and secured to the skin with Prolene suture. A sterile bandage was applied. Attention was then turned to the right groin. The right common femoral artery was interrogated with ultrasound and found to be widely patent. An image was obtained and stored for the medical record. Local anesthesia was attained by infiltration with 1% lidocaine. A small dermatotomy was made. Under real-time sonographic guidance, the vessel was punctured with a 21 gauge micropuncture needle. Using standard technique, the initial micro needle was exchanged over a 0.018 micro wire for a transitional 4 [place] micro sheath. The micro sheath was then exchanged over a 0.035 wire for a working 5 [place] vascular sheath. A C2 cobra catheter was then advanced over the Bentson wire into the abdominal aorta. The catheter was used to select the superior mesenteric artery. A superior mesenteric arteriogram was performed. There is obvious active extravasation occurring in the  proximal to mid ascending colon from a visible branch artery arising from the marginal artery. A renegade STC micro catheter was then advanced over a Fathom wire and used to select the middle colic artery. An arteriogram was performed confirming the vascular anatomy. The micro catheter was then navigated into the right branch of the middle colic artery. Arteriogram was again performed confirming that this vessel is ultimately contiguous with the site of active bleeding. The micro catheter was then advanced into the marginal artery and arteriogram was performed. Finally, the micro catheter was advanced into the unnamed ascending colonic branch artery. Contrast injection confirms rapid active bleeding into the ascending colon. Coil embolization was performed using a combination of 2 x 40 mm fibered and soft interlock detachable coils. Post embolization arteriogram demonstrates successful embolization. There is trace revascularization of the distal most aspect of the bleeding artery secondary to collateral per fusion but this is minimal and only visible during high-pressure injection. To assess for other treatable sources of collateral supply, the micro catheter was brought back into the superior mesenteric artery and advanced into the right colic artery. Arteriography was performed. No evidence of active extravasation from any of the distal branches of the right colic artery. The catheters were removed. A limited right common femoral arteriogram was performed confirming common femoral arterial access. Hemostasis was attained with the assistance of a Cordis Exoseal extra arterial vascular plug. IMPRESSION: 1. Successful placement of a right IJ approach triple-lumen central venous catheter. The catheter tip is at the cavoatrial junction and ready for use. 2. Angiography demonstrates active brisk bleeding from a branch of the marginal artery in the proximal to mid ascending colon. 3. Successful coil embolization of the  actively bleeding artery. Signed, Sterling Big, MD [facility] Radiology Electronically Signed   By: Malachy Moan M.D.   On: 09/12/2016 12:28   Ir Angiogram Selective Each Additional Vessel  Result Date: 09/12/2016 INDICATION: 82 year old female with large volume active lower GI bleed requiring transfusion. Nuclear medicine tagged red blood cell study localizes the bleed to the region of the cecum/ascending colon. Additionally, she has hypotension not responding to fluid resuscitation. She requires more blood products. We will place an emergent central line, administer more blood and perform visceral angiography with embolization if the bleeding source can be  identified. EXAM: SELECTIVE VISCERAL ARTERIOGRAPHY; IR RIGHT FLOURO GUIDE CV LINE; IR ULTRASOUND GUIDANCE VASC ACCESS RIGHT; ADDITIONAL ARTERIOGRAPHY; IR EMBO ART VEN HEMORR LYMPH EXTRAV INC GUIDE ROADMAPPING 1. Ultrasound-guided access right internal jugular vein 2. Placement of a right IJ central venous catheter 3. Ultrasound-guided access right common femoral artery 4. Catheterization superior mesenteric artery with arteriogram 5. Catheterization middle colic artery with arteriogram 6. Catheterization right branch of the middle colic artery with arteriogram 7. Catheterization marginal artery with arteriogram 8. Catheterization of an un-named ascending colonic branch artery with arteriogram 9. Coil embolization of unnamed ascending colonic branch artery 10. Catheterization of the right colic artery with arteriogram Interventional Radiologist:  Sterling Big, MD MEDICATIONS: 1 unit packed red blood cells administered during the course of the procedure. ANESTHESIA/SEDATION: Fentanyl 0 mcg IV; Versed 3 mg IV Moderate Sedation Time:  59 minutes The patient was continuously monitored during the procedure by the interventional radiology nurse under my direct supervision. CONTRAST:  25mL  ISOVUE-300 IOPAMIDOL (ISOVUE-300) INJECTION 61%, 65mL ISOVUE-300 IOPAMIDOL (ISOVUE-300) INJECTION 61% FLUOROSCOPY TIME:  Fluoroscopy Time: 16 minutes 30 seconds (777 mGy). COMPLICATIONS: None immediate. PROCEDURE: Informed consent was obtained from the patient following explanation of the procedure, risks, benefits and alternatives. The patient understands, agrees and consents for the procedure. All questions were addressed. A time out was performed. Maximal barrier sterile technique utilized including caps, mask, sterile gowns, sterile gloves, large sterile drape, hand hygiene, and chlorhexidine skin prep. Attention was first turned to the right neck to establish central venous access. The right internal jugular vein was interrogated with ultrasound and found to be widely patent. An image was obtained and stored for the medical record. Local anesthesia was attained by infiltration with 1% lidocaine. A small dermatotomy was made. Under real-time sonographic guidance, the vessel was punctured with an 18 gauge needle. Using standard technique, the needle was exchanged over a 0.013 inch wire and the skin tract was dilated. A then, an Arrow triple-lumen catheter was advanced over the wire and position with the tip at the cavoatrial junction. The catheter flushed and aspirated with ease. It was flushed, capped and secured to the skin with Prolene suture. A sterile bandage was applied. Attention was then turned to the right groin. The right common femoral artery was interrogated with ultrasound and found to be widely patent. An image was obtained and stored for the medical record. Local anesthesia was attained by infiltration with 1% lidocaine. A small dermatotomy was made. Under real-time sonographic guidance, the vessel was punctured with a 21 gauge micropuncture needle. Using standard technique, the initial micro needle was exchanged over a 0.018 micro wire for a transitional 4 [place] micro sheath. The micro sheath was  then exchanged over a 0.035 wire for a working 5 [place] vascular sheath. A C2 cobra catheter was then advanced over the Bentson wire into the abdominal aorta. The catheter was used to select the superior mesenteric artery. A superior mesenteric arteriogram was performed. There is obvious active extravasation occurring in the proximal to mid ascending colon from a visible branch artery arising from the marginal artery. A renegade STC micro catheter was then advanced over a Fathom wire and used to select the middle colic artery. An arteriogram was performed confirming the vascular anatomy. The micro catheter was then navigated into the right branch of the middle colic artery. Arteriogram was again performed confirming that this vessel is ultimately contiguous with the site of active bleeding. The micro catheter was then advanced into the marginal  artery and arteriogram was performed. Finally, the micro catheter was advanced into the unnamed ascending colonic branch artery. Contrast injection confirms rapid active bleeding into the ascending colon. Coil embolization was performed using a combination of 2 x 40 mm fibered and soft interlock detachable coils. Post embolization arteriogram demonstrates successful embolization. There is trace revascularization of the distal most aspect of the bleeding artery secondary to collateral per fusion but this is minimal and only visible during high-pressure injection. To assess for other treatable sources of collateral supply, the micro catheter was brought back into the superior mesenteric artery and advanced into the right colic artery. Arteriography was performed. No evidence of active extravasation from any of the distal branches of the right colic artery. The catheters were removed. A limited right common femoral arteriogram was performed confirming common femoral arterial access. Hemostasis was attained with the assistance of a Cordis Exoseal extra arterial vascular plug.  IMPRESSION: 1. Successful placement of a right IJ approach triple-lumen central venous catheter. The catheter tip is at the cavoatrial junction and ready for use. 2. Angiography demonstrates active brisk bleeding from a branch of the marginal artery in the proximal to mid ascending colon. 3. Successful coil embolization of the actively bleeding artery. Signed, Sterling Big, MD [facility] Radiology Electronically Signed   By: Malachy Moan M.D.   On: 09/12/2016 12:28   Ir Angiogram Selective Each Additional Vessel  Result Date: 09/12/2016 INDICATION: 82 year old female with large volume active lower GI bleed requiring transfusion. Nuclear medicine tagged red blood cell study localizes the bleed to the region of the cecum/ascending colon. Additionally, she has hypotension not responding to fluid resuscitation. She requires more blood products. We will place an emergent central line, administer more blood and perform visceral angiography with embolization if the bleeding source can be identified. EXAM: SELECTIVE VISCERAL ARTERIOGRAPHY; IR RIGHT FLOURO GUIDE CV LINE; IR ULTRASOUND GUIDANCE VASC ACCESS RIGHT; ADDITIONAL ARTERIOGRAPHY; IR EMBO ART VEN HEMORR LYMPH EXTRAV INC GUIDE ROADMAPPING 1. Ultrasound-guided access right internal jugular vein 2. Placement of a right IJ central venous catheter 3. Ultrasound-guided access right common femoral artery 4. Catheterization superior mesenteric artery with arteriogram 5. Catheterization middle colic artery with arteriogram 6. Catheterization right branch of the middle colic artery with arteriogram 7. Catheterization marginal artery with arteriogram 8. Catheterization of an un-named ascending colonic branch artery with arteriogram 9. Coil embolization of unnamed ascending colonic branch artery 10. Catheterization of the right colic artery with arteriogram Interventional Radiologist:  Sterling Big, MD  MEDICATIONS: 1 unit packed red blood cells administered during the course of the procedure. ANESTHESIA/SEDATION: Fentanyl 0 mcg IV; Versed 3 mg IV Moderate Sedation Time:  59 minutes The patient was continuously monitored during the procedure by the interventional radiology nurse under my direct supervision. CONTRAST:  25mL ISOVUE-300 IOPAMIDOL (ISOVUE-300) INJECTION 61%, 65mL ISOVUE-300 IOPAMIDOL (ISOVUE-300) INJECTION 61% FLUOROSCOPY TIME:  Fluoroscopy Time: 16 minutes 30 seconds (777 mGy). COMPLICATIONS: None immediate. PROCEDURE: Informed consent was obtained from the patient following explanation of the procedure, risks, benefits and alternatives. The patient understands, agrees and consents for the procedure. All questions were addressed. A time out was performed. Maximal barrier sterile technique utilized including caps, mask, sterile gowns, sterile gloves, large sterile drape, hand hygiene, and chlorhexidine skin prep. Attention was first turned to the right neck to establish central venous access. The right internal jugular vein was interrogated with ultrasound and found to be widely patent. An image was obtained and stored for  the medical record. Local anesthesia was attained by infiltration with 1% lidocaine. A small dermatotomy was made. Under real-time sonographic guidance, the vessel was punctured with an 18 gauge needle. Using standard technique, the needle was exchanged over a 0.013 inch wire and the skin tract was dilated. A then, an Arrow triple-lumen catheter was advanced over the wire and position with the tip at the cavoatrial junction. The catheter flushed and aspirated with ease. It was flushed, capped and secured to the skin with Prolene suture. A sterile bandage was applied. Attention was then turned to the right groin. The right common femoral artery was interrogated with ultrasound and found to be widely patent. An image was obtained and stored for the medical record. Local anesthesia was  attained by infiltration with 1% lidocaine. A small dermatotomy was made. Under real-time sonographic guidance, the vessel was punctured with a 21 gauge micropuncture needle. Using standard technique, the initial micro needle was exchanged over a 0.018 micro wire for a transitional 4 [place] micro sheath. The micro sheath was then exchanged over a 0.035 wire for a working 5 [place] vascular sheath. A C2 cobra catheter was then advanced over the Bentson wire into the abdominal aorta. The catheter was used to select the superior mesenteric artery. A superior mesenteric arteriogram was performed. There is obvious active extravasation occurring in the proximal to mid ascending colon from a visible branch artery arising from the marginal artery. A renegade STC micro catheter was then advanced over a Fathom wire and used to select the middle colic artery. An arteriogram was performed confirming the vascular anatomy. The micro catheter was then navigated into the right branch of the middle colic artery. Arteriogram was again performed confirming that this vessel is ultimately contiguous with the site of active bleeding. The micro catheter was then advanced into the marginal artery and arteriogram was performed. Finally, the micro catheter was advanced into the unnamed ascending colonic branch artery. Contrast injection confirms rapid active bleeding into the ascending colon. Coil embolization was performed using a combination of 2 x 40 mm fibered and soft interlock detachable coils. Post embolization arteriogram demonstrates successful embolization. There is trace revascularization of the distal most aspect of the bleeding artery secondary to collateral per fusion but this is minimal and only visible during high-pressure injection. To assess for other treatable sources of collateral supply, the micro catheter was brought back into the superior mesenteric artery and advanced into the right colic artery. Arteriography was  performed. No evidence of active extravasation from any of the distal branches of the right colic artery. The catheters were removed. A limited right common femoral arteriogram was performed confirming common femoral arterial access. Hemostasis was attained with the assistance of a Cordis Exoseal extra arterial vascular plug. IMPRESSION: 1. Successful placement of a right IJ approach triple-lumen central venous catheter. The catheter tip is at the cavoatrial junction and ready for use. 2. Angiography demonstrates active brisk bleeding from a branch of the marginal artery in the proximal to mid ascending colon. 3. Successful coil embolization of the actively bleeding artery. Signed, Sterling Big, MD [facility] Special[facility] Radiology Electronically Signed   By: Malachy Moan M.D.   On: 09/12/2016 12:28   Ir Fluoro Guide Cv Line Right  Result Date: 09/12/2016 INDICATION: 82 year old female with large volume active lower GI bleed requiring transfusion. Nuclear medicine tagged red blood cell study localizes the bleed to the region of the cecum/ascending colon. Additionally, she has hypotension not responding  to fluid resuscitation. She requires more blood products. We will place an emergent central line, administer more blood and perform visceral angiography with embolization if the bleeding source can be identified. EXAM: SELECTIVE VISCERAL ARTERIOGRAPHY; IR RIGHT FLOURO GUIDE CV LINE; IR ULTRASOUND GUIDANCE VASC ACCESS RIGHT; ADDITIONAL ARTERIOGRAPHY; IR EMBO ART VEN HEMORR LYMPH EXTRAV INC GUIDE ROADMAPPING 1. Ultrasound-guided access right internal jugular vein 2. Placement of a right IJ central venous catheter 3. Ultrasound-guided access right common femoral artery 4. Catheterization superior mesenteric artery with arteriogram 5. Catheterization middle colic artery with arteriogram 6. Catheterization right branch of the middle colic artery with arteriogram 7.  Catheterization marginal artery with arteriogram 8. Catheterization of an un-named ascending colonic branch artery with arteriogram 9. Coil embolization of unnamed ascending colonic branch artery 10. Catheterization of the right colic artery with arteriogram Interventional Radiologist:  Sterling Big, MD MEDICATIONS: 1 unit packed red blood cells administered during the course of the procedure. ANESTHESIA/SEDATION: Fentanyl 0 mcg IV; Versed 3 mg IV Moderate Sedation Time:  59 minutes The patient was continuously monitored during the procedure by the interventional radiology nurse under my direct supervision. CONTRAST:  25mL ISOVUE-300 IOPAMIDOL (ISOVUE-300) INJECTION 61%, 65mL ISOVUE-300 IOPAMIDOL (ISOVUE-300) INJECTION 61% FLUOROSCOPY TIME:  Fluoroscopy Time: 16 minutes 30 seconds (777 mGy). COMPLICATIONS: None immediate. PROCEDURE: Informed consent was obtained from the patient following explanation of the procedure, risks, benefits and alternatives. The patient understands, agrees and consents for the procedure. All questions were addressed. A time out was performed. Maximal barrier sterile technique utilized including caps, mask, sterile gowns, sterile gloves, large sterile drape, hand hygiene, and chlorhexidine skin prep. Attention was first turned to the right neck to establish central venous access. The right internal jugular vein was interrogated with ultrasound and found to be widely patent. An image was obtained and stored for the medical record. Local anesthesia was attained by infiltration with 1% lidocaine. A small dermatotomy was made. Under real-time sonographic guidance, the vessel was punctured with an 18 gauge needle. Using standard technique, the needle was exchanged over a 0.013 inch wire and the skin tract was dilated. A then, an Arrow triple-lumen catheter was advanced over the wire and position with the tip at the cavoatrial junction. The catheter flushed and aspirated with ease. It was  flushed, capped and secured to the skin with Prolene suture. A sterile bandage was applied. Attention was then turned to the right groin. The right common femoral artery was interrogated with ultrasound and found to be widely patent. An image was obtained and stored for the medical record. Local anesthesia was attained by infiltration with 1% lidocaine. A small dermatotomy was made. Under real-time sonographic guidance, the vessel was punctured with a 21 gauge micropuncture needle. Using standard technique, the initial micro needle was exchanged over a 0.018 micro wire for a transitional 4 [place] micro sheath. The micro sheath was then exchanged over a 0.035 wire for a working 5 [place] vascular sheath. A C2 cobra catheter was then advanced over the Bentson wire into the abdominal aorta. The catheter was used to select the superior mesenteric artery. A superior mesenteric arteriogram was performed. There is obvious active extravasation occurring in the proximal to mid ascending colon from a visible branch artery arising from the marginal artery. A renegade STC micro catheter was then advanced over a Fathom wire and used to select the middle colic artery. An arteriogram was performed confirming the vascular anatomy. The micro catheter was then navigated into the right branch of  the middle colic artery. Arteriogram was again performed confirming that this vessel is ultimately contiguous with the site of active bleeding. The micro catheter was then advanced into the marginal artery and arteriogram was performed. Finally, the micro catheter was advanced into the unnamed ascending colonic branch artery. Contrast injection confirms rapid active bleeding into the ascending colon. Coil embolization was performed using a combination of 2 x 40 mm fibered and soft interlock detachable coils. Post embolization arteriogram demonstrates successful embolization. There is trace revascularization of the distal most aspect of the  bleeding artery secondary to collateral per fusion but this is minimal and only visible during high-pressure injection. To assess for other treatable sources of collateral supply, the micro catheter was brought back into the superior mesenteric artery and advanced into the right colic artery. Arteriography was performed. No evidence of active extravasation from any of the distal branches of the right colic artery. The catheters were removed. A limited right common femoral arteriogram was performed confirming common femoral arterial access. Hemostasis was attained with the assistance of a Cordis Exoseal extra arterial vascular plug. IMPRESSION: 1. Successful placement of a right IJ approach triple-lumen central venous catheter. The catheter tip is at the cavoatrial junction and ready for use. 2. Angiography demonstrates active brisk bleeding from a branch of the marginal artery in the proximal to mid ascending colon. 3. Successful coil embolization of the actively bleeding artery. Signed, Sterling BigHeath K. McCullough, MD [facility] Electronically Signed   By: Malachy MoanHeath  McCullough M.D.   On: 09/12/2016 12:28   Ir [place] Guide Vasc Access Right  Result Date: 09/12/2016 INDICATION: 82 year old female with large volume active lower GI bleed requiring transfusion. Nuclear medicine tagged red blood cell study localizes the bleed to the region of the cecum/ascending colon. Additionally, she has hypotension not responding to fluid resuscitation. She requires more blood products. We will place an emergent central line, administer more blood and perform visceral angiography with embolization if the bleeding source can be identified. EXAM: SELECTIVE VISCERAL ARTERIOGRAPHY; IR RIGHT FLOURO GUIDE CV LINE; IR ULTRASOUND GUIDANCE VASC ACCESS RIGHT; ADDITIONAL ARTERIOGRAPHY; IR EMBO ART VEN HEMORR LYMPH EXTRAV INC GUIDE ROADMAPPING 1. Ultrasound-guided access right internal jugular  vein 2. Placement of a right IJ central venous catheter 3. Ultrasound-guided access right common femoral artery 4. Catheterization superior mesenteric artery with arteriogram 5. Catheterization middle colic artery with arteriogram 6. Catheterization right branch of the middle colic artery with arteriogram 7. Catheterization marginal artery with arteriogram 8. Catheterization of an un-named ascending colonic branch artery with arteriogram 9. Coil embolization of unnamed ascending colonic branch artery 10. Catheterization of the right colic artery with arteriogram Interventional Radiologist:  Sterling BigHeath K. McCullough, MD MEDICATIONS: 1 unit packed red blood cells administered during the course of the procedure. ANESTHESIA/SEDATION: Fentanyl 0 mcg IV; Versed 3 mg IV Moderate Sedation Time:  59 minutes The patient was continuously monitored during the procedure by the interventional radiology nurse under my direct supervision. CONTRAST:  25mL ISOVUE-300 IOPAMIDOL (ISOVUE-300) INJECTION 61%, 65mL ISOVUE-300 IOPAMIDOL (ISOVUE-300) INJECTION 61% FLUOROSCOPY TIME:  Fluoroscopy Time: 16 minutes 30 seconds (777 mGy). COMPLICATIONS: None immediate. PROCEDURE: Informed consent was obtained from the patient following explanation of the procedure, risks, benefits and alternatives. The patient understands, agrees and consents for the procedure. All questions were addressed. A time out was performed. Maximal barrier sterile technique utilized including caps, mask, sterile gowns, sterile gloves, large sterile drape, hand hygiene, and chlorhexidine skin prep. Attention was first turned to  the right neck to establish central venous access. The right internal jugular vein was interrogated with ultrasound and found to be widely patent. An image was obtained and stored for the medical record. Local anesthesia was attained by infiltration with 1% lidocaine. A small dermatotomy was made. Under real-time sonographic guidance, the vessel was  punctured with an 18 gauge needle. Using standard technique, the needle was exchanged over a 0.013 inch wire and the skin tract was dilated. A then, an Arrow triple-lumen catheter was advanced over the wire and position with the tip at the cavoatrial junction. The catheter flushed and aspirated with ease. It was flushed, capped and secured to the skin with Prolene suture. A sterile bandage was applied. Attention was then turned to the right groin. The right common femoral artery was interrogated with ultrasound and found to be widely patent. An image was obtained and stored for the medical record. Local anesthesia was attained by infiltration with 1% lidocaine. A small dermatotomy was made. Under real-time sonographic guidance, the vessel was punctured with a 21 gauge micropuncture needle. Using standard technique, the initial micro needle was exchanged over a 0.018 micro wire for a transitional 4 [place] micro sheath. The micro sheath was then exchanged over a 0.035 wire for a working 5 [place] vascular sheath. A C2 cobra catheter was then advanced over the Bentson wire into the abdominal aorta. The catheter was used to select the superior mesenteric artery. A superior mesenteric arteriogram was performed. There is obvious active extravasation occurring in the proximal to mid ascending colon from a visible branch artery arising from the marginal artery. A renegade STC micro catheter was then advanced over a Fathom wire and used to select the middle colic artery. An arteriogram was performed confirming the vascular anatomy. The micro catheter was then navigated into the right branch of the middle colic artery. Arteriogram was again performed confirming that this vessel is ultimately contiguous with the site of active bleeding. The micro catheter was then advanced into the marginal artery and arteriogram was performed. Finally, the micro catheter was advanced into the unnamed ascending colonic branch artery. Contrast  injection confirms rapid active bleeding into the ascending colon. Coil embolization was performed using a combination of 2 x 40 mm fibered and soft interlock detachable coils. Post embolization arteriogram demonstrates successful embolization. There is trace revascularization of the distal most aspect of the bleeding artery secondary to collateral per fusion but this is minimal and only visible during high-pressure injection. To assess for other treatable sources of collateral supply, the micro catheter was brought back into the superior mesenteric artery and advanced into the right colic artery. Arteriography was performed. No evidence of active extravasation from any of the distal branches of the right colic artery. The catheters were removed. A limited right common femoral arteriogram was performed confirming common femoral arterial access. Hemostasis was attained with the assistance of a Cordis Exoseal extra arterial vascular plug. IMPRESSION: 1. Successful placement of a right IJ approach triple-lumen central venous catheter. The catheter tip is at the cavoatrial junction and ready for use. 2. Angiography demonstrates active brisk bleeding from a branch of the marginal artery in the proximal to mid ascending colon. 3. Successful coil embolization of the actively bleeding artery. Signed, Sterling Big, MD [facility] Radiology Electronically Signed   By: Malachy Moan M.D.   On: 09/12/2016 12:28   Ir US Guide Vasc Access Right  Result Date: 09/12/2016 INDICATION: 82 year old female with large  volume active lower GI bleed requiring transfusion. Nuclear medicine tagged red blood cell study localizes the bleed to the region of the cecum/ascending colon. Additionally, she has hypotension not responding to fluid resuscitation. She requires more blood products. We will place an emergent central line, administer more blood and perform visceral angiography  with embolization if the bleeding source can be identified. EXAM: SELECTIVE VISCERAL ARTERIOGRAPHY; IR RIGHT FLOURO GUIDE CV LINE; IR ULTRASOUND GUIDANCE VASC ACCESS RIGHT; ADDITIONAL ARTERIOGRAPHY; IR EMBO ART VEN HEMORR LYMPH EXTRAV INC GUIDE ROADMAPPING 1. Ultrasound-guided access right internal jugular vein 2. Placement of a right IJ central venous catheter 3. Ultrasound-guided access right common femoral artery 4. Catheterization superior mesenteric artery with arteriogram 5. Catheterization middle colic artery with arteriogram 6. Catheterization right branch of the middle colic artery with arteriogram 7. Catheterization marginal artery with arteriogram 8. Catheterization of an un-named ascending colonic branch artery with arteriogram 9. Coil embolization of unnamed ascending colonic branch artery 10. Catheterization of the right colic artery with arteriogram Interventional Radiologist:  Sterling Big, MD MEDICATIONS: 1 unit packed red blood cells administered during the course of the procedure. ANESTHESIA/SEDATION: Fentanyl 0 mcg IV; Versed 3 mg IV Moderate Sedation Time:  59 minutes The patient was continuously monitored during the procedure by the interventional radiology nurse under my direct supervision. CONTRAST:  25mL ISOVUE-300 IOPAMIDOL (ISOVUE-300) INJECTION 61%, 65mL ISOVUE-300 IOPAMIDOL (ISOVUE-300) INJECTION 61% FLUOROSCOPY TIME:  Fluoroscopy Time: 16 minutes 30 seconds (777 mGy). COMPLICATIONS: None immediate. PROCEDURE: Informed consent was obtained from the patient following explanation of the procedure, risks, benefits and alternatives. The patient understands, agrees and consents for the procedure. All questions were addressed. A time out was performed. Maximal barrier sterile technique utilized including caps, mask, sterile gowns, sterile gloves, large sterile drape, hand hygiene, and chlorhexidine skin prep. Attention was first turned to the right neck to establish central venous  access. The right internal jugular vein was interrogated with ultrasound and found to be widely patent. An image was obtained and stored for the medical record. Local anesthesia was attained by infiltration with 1% lidocaine. A small dermatotomy was made. Under real-time sonographic guidance, the vessel was punctured with an 18 gauge needle. Using standard technique, the needle was exchanged over a 0.013 inch wire and the skin tract was dilated. A then, an Arrow triple-lumen catheter was advanced over the wire and position with the tip at the cavoatrial junction. The catheter flushed and aspirated with ease. It was flushed, capped and secured to the skin with Prolene suture. A sterile bandage was applied. Attention was then turned to the right groin. The right common femoral artery was interrogated with ultrasound and found to be widely patent. An image was obtained and stored for the medical record. Local anesthesia was attained by infiltration with 1% lidocaine. A small dermatotomy was made. Under real-time sonographic guidance, the vessel was punctured with a 21 gauge micropuncture needle. Using standard technique, the initial micro needle was exchanged over a 0.018 micro wire for a transitional 4 [place] micro sheath. The micro sheath was then exchanged over a 0.035 wire for a working 5 [place] vascular sheath. A C2 cobra catheter was then advanced over the Bentson wire into the abdominal aorta. The catheter was used to select the superior mesenteric artery. A superior mesenteric arteriogram was performed. There is obvious active extravasation occurring in the proximal to mid ascending colon from a visible branch artery arising from the marginal artery. A renegade STC micro catheter was then advanced over  a Fathom wire and used to select the middle colic artery. An arteriogram was performed confirming the vascular anatomy. The micro catheter was then navigated into the right branch of the middle colic artery.  Arteriogram was again performed confirming that this vessel is ultimately contiguous with the site of active bleeding. The micro catheter was then advanced into the marginal artery and arteriogram was performed. Finally, the micro catheter was advanced into the unnamed ascending colonic branch artery. Contrast injection confirms rapid active bleeding into the ascending colon. Coil embolization was performed using a combination of 2 x 40 mm fibered and soft interlock detachable coils. Post embolization arteriogram demonstrates successful embolization. There is trace revascularization of the distal most aspect of the bleeding artery secondary to collateral per fusion but this is minimal and only visible during high-pressure injection. To assess for other treatable sources of collateral supply, the micro catheter was brought back into the superior mesenteric artery and advanced into the right colic artery. Arteriography was performed. No evidence of active extravasation from any of the distal branches of the right colic artery. The catheters were removed. A limited right common femoral arteriogram was performed confirming common femoral arterial access. Hemostasis was attained with the assistance of a Cordis Exoseal extra arterial vascular plug. IMPRESSION: 1. Successful placement of a right IJ approach triple-lumen central venous catheter. The catheter tip is at the cavoatrial junction and ready for use. 2. Angiography demonstrates active brisk bleeding from a branch of the marginal artery in the proximal to mid ascending colon. 3. Successful coil embolization of the actively bleeding artery. Signed, Sterling Big, MD [facility] Radiology Electronically Signed   By: Malachy Moan M.D.   On: 09/12/2016 12:28   Dg Chest Portable 1 View  Result Date: 09/12/2016 CLINICAL DATA:  Rectal bleeding since last night. Weakness. Shortness of breath. EXAM: PORTABLE  CHEST 1 VIEW COMPARISON:  08/06/2015 FINDINGS: Unchanged mediastinal contours with thoracic aortic atherosclerosis. Chronic appearing increased interstitial opacities, suggesting emphysema. Calcified granuloma in the right midlung zone. No focal airspace disease, evidence of pulmonary edema, pleural effusion or pneumothorax. The bones are under mineralized. IMPRESSION: Chronic interstitial thickening.  No evidence of acute abnormality. Thoracic aortic atherosclerosis. Electronically Signed   By: Rubye Oaks M.D.   On: 09/12/2016 02:28   Ir Embo Art  [facility] [employer] Guide Roadmapping  Result Date: 09/12/2016 INDICATION: 82 year old female with large volume active lower GI bleed requiring transfusion. Nuclear medicine tagged red blood cell study localizes the bleed to the region of the cecum/ascending colon. Additionally, she has hypotension not responding to fluid resuscitation. She requires more blood products. We will place an emergent central line, administer more blood and perform visceral angiography with embolization if the bleeding source can be identified. EXAM: SELECTIVE VISCERAL ARTERIOGRAPHY; IR RIGHT FLOURO GUIDE CV LINE; IR ULTRASOUND GUIDANCE VASC ACCESS RIGHT; ADDITIONAL ARTERIOGRAPHY; IR EMBO ART VEN HEMORR LYMPH EXTRAV INC GUIDE ROADMAPPING 1. Ultrasound-guided access right internal jugular vein 2. Placement of a right IJ central venous catheter 3. Ultrasound-guided access right common femoral artery 4. Catheterization superior mesenteric artery with arteriogram 5. Catheterization middle colic artery with arteriogram 6. Catheterization right branch of the middle colic artery with arteriogram 7. Catheterization marginal artery with arteriogram 8. Catheterization of an un-named ascending colonic branch artery with arteriogram 9. Coil embolization of unnamed ascending colonic branch artery 10. Catheterization of the right colic artery with arteriogram Interventional Radiologist:   Sterling Big, MD MEDICATIONS:  1 unit packed red blood cells administered during the course of the procedure. ANESTHESIA/SEDATION: Fentanyl 0 mcg IV; Versed 3 mg IV Moderate Sedation Time:  59 minutes The patient was continuously monitored during the procedure by the interventional radiology nurse under my direct supervision. CONTRAST:  25mL ISOVUE-300 IOPAMIDOL (ISOVUE-300) INJECTION 61%, 65mL ISOVUE-300 IOPAMIDOL (ISOVUE-300) INJECTION 61% FLUOROSCOPY TIME:  Fluoroscopy Time: 16 minutes 30 seconds (777 mGy). COMPLICATIONS: None immediate. PROCEDURE: Informed consent was obtained from the patient following explanation of the procedure, risks, benefits and alternatives. The patient understands, agrees and consents for the procedure. All questions were addressed. A time out was performed. Maximal barrier sterile technique utilized including caps, mask, sterile gowns, sterile gloves, large sterile drape, hand hygiene, and chlorhexidine skin prep. Attention was first turned to the right neck to establish central venous access. The right internal jugular vein was interrogated with ultrasound and found to be widely patent. An image was obtained and stored for the medical record. Local anesthesia was attained by infiltration with 1% lidocaine. A small dermatotomy was made. Under real-time sonographic guidance, the vessel was punctured with an 18 gauge needle. Using standard technique, the needle was exchanged over a 0.013 inch wire and the skin tract was dilated. A then, an Arrow triple-lumen catheter was advanced over the wire and position with the tip at the cavoatrial junction. The catheter flushed and aspirated with ease. It was flushed, capped and secured to the skin with Prolene suture. A sterile bandage was applied. Attention was then turned to the right groin. The right common femoral artery was interrogated with ultrasound and found to be widely patent. An image was obtained and stored for the medical  record. Local anesthesia was attained by infiltration with 1% lidocaine. A small dermatotomy was made. Under real-time sonographic guidance, the vessel was punctured with a 21 gauge micropuncture needle. Using standard technique, the initial micro needle was exchanged over a 0.018 micro wire for a transitional 4 [place] micro sheath. The micro sheath was then exchanged over a 0.035 wire for a working 5 [place] vascular sheath. A C2 cobra catheter was then advanced over the Bentson wire into the abdominal aorta. The catheter was used to select the superior mesenteric artery. A superior mesenteric arteriogram was performed. There is obvious active extravasation occurring in the proximal to mid ascending colon from a visible branch artery arising from the marginal artery. A renegade STC micro catheter was then advanced over a Fathom wire and used to select the middle colic artery. An arteriogram was performed confirming the vascular anatomy. The micro catheter was then navigated into the right branch of the middle colic artery. Arteriogram was again performed confirming that this vessel is ultimately contiguous with the site of active bleeding. The micro catheter was then advanced into the marginal artery and arteriogram was performed. Finally, the micro catheter was advanced into the unnamed ascending colonic branch artery. Contrast injection confirms rapid active bleeding into the ascending colon. Coil embolization was performed using a combination of 2 x 40 mm fibered and soft interlock detachable coils. Post embolization arteriogram demonstrates successful embolization. There is trace revascularization of the distal most aspect of the bleeding artery secondary to collateral per fusion but this is minimal and only visible during high-pressure injection. To assess for other treatable sources of collateral supply, the micro catheter was brought back into the superior mesenteric artery and advanced into the right colic  artery. Arteriography was performed. No evidence of active extravasation from any of the distal  branches of the right colic artery. The catheters were removed. A limited right common femoral arteriogram was performed confirming common femoral arterial access. Hemostasis was attained with the assistance of a Cordis Exoseal extra arterial vascular plug. IMPRESSION: 1. Successful placement of a right IJ approach triple-lumen central venous catheter. The catheter tip is at the cavoatrial junction and ready for use. 2. Angiography demonstrates active brisk bleeding from a branch of the marginal artery in the proximal to mid ascending colon. 3. Successful coil embolization of the actively bleeding artery. Signed, Sterling Big, MD [facility] Radiology Electronically Signed   By: Malachy Moan M.D.   On: 09/12/2016 12:28    Labs:  CBC:  Recent Labs  11/12/15 1537 02/13/16 1519 09/12/16 0137  09/12/16 1435 09/12/16 1610 09/13/16 0320 09/13/16 1115  WBC 5.1 4.4 8.3  --   --  22.8*  --   --   HGB 10.9* 11.4* 9.0*  < > 8.7* 8.5* 8.3* 7.2*  HCT 34.1* 34.2* 27.7*  < > 25.3* 24.7* 23.3* 20.4*  PLT 201.0 198.0 157  --   --  49*  --   --   < > = values in this interval not displayed.  COAGS:  Recent Labs  09/12/16 1610  INR 1.72  APTT 36    BMP:  Recent Labs  02/13/16 1519 09/12/16 0137 09/12/16 0200 09/12/16 1610 09/13/16 1115  NA 137 138 138 144 140  K 4.7 4.3 4.3 4.1 4.0  CL 104 112* 111 127* 124*  CO2 26 20*  --  10* 14*  GLUCOSE 93 197* 191* 182* 106*  BUN 26* 34* 35* 26* 26*  CALCIUM 9.9 9.0  --  6.5* 7.2*  CREATININE 1.26* 1.24* 1.30* 1.17* 1.31*  GFRNONAA  --  36*  --  39* 34*  GFRAA  --  42*  --  45* 39*    LIVER FUNCTION TESTS:  Recent Labs  09/12/16 0137 09/12/16 1610  BILITOT 0.3 0.5  AST 21 22  ALT 9* 12*  ALKPHOS 85 42  PROT 6.0* 3.5*  ALBUMIN 3.6 2.0*    Assessment and Plan: History lower GI  bleed, status post mesenteric angiogram with embolization of actively bleeding branch of the ileocecal artery on 09/12/16; no further visible bleeding since coiling; AF; hgb 7.2(8.3); creat 1.31(1.17), GFR 39(45); cont with close monitoring of stools/labs; IV hydration; transfuse for hgb <7.   Electronically Signed: D. Jeananne Rama 09/13/2016, 2:55 PM   I spent a total of 15 minutes at the the patient's bedside AND on the patient's hospital floor or unit, greater than 50% of which was counseling/coordinating care for mesenteric arteriogram with embolization

## 2016-09-13 NOTE — Progress Notes (Signed)
Name: DELANY STEURY MRN: [id number] DOB: Feb 07, 1920    ADMISSION DATE:  09/12/2016 CONSULTATION DATE:  09/13/2016  REFERRING MD :  Lindaann Pascal  CHIEF COMPLAINT:  Bleeding per rectum  HISTORY OF PRESENT ILLNESS:  82 year old woman with COPD and chronic diastolic heart failure presented 917 early a.m. with bright red blood per rectum and hypotension. NG lavage was clear. Nuclear scan confirmed active bleeding in the cecum and ascending colon. She was transfused 2 units of blood but remained hypotensive and was taken emergently for angiogram and the cecal area was embolized. She was transfused another unit of blood during the procedure. She required 3 mg of Versed for sedation. I was called emergently due to hypotension and possible need for pressors  She also has a medical history of hypertension, CK D and small bowel obstruction in 2016    SUBJECTIVE:  Pt reports feeling tired.  No further bleeding overnight.    VITAL SIGNS: Temp:  [94.3 F (34.6 C)-99.9 F (37.7 C)] 98.5 F (36.9 C) (09/18 1200) Pulse Rate:  [62-118] 98 (09/18 1200) Resp:  [16-39] 16 (09/18 1200) BP: (69-152)/(35-86) 113/86 (09/18 1200) SpO2:  [94 %-100 %] 100 % (09/18 1200)  PHYSICAL EXAMINATION: Gen. Elderly, well-nourished female in NAD  ENT no lesions, no post nasal drip Neck: No JVD, no thyromegaly, no carotid bruits Lungs:  Even/non-labored, lungs bilaterally clear Cardiovascular: rrr, no r/g, soft SEMmurmur  Abdomen: soft and non-tender, no hepatosplenomegaly, BS normal. Musculoskeletal: No deformities, no cyanosis or clubbing Neuro: awake, alert, pleasant, MAE    Recent Labs Lab 09/12/16 0137 09/12/16 0200 09/12/16 1610 09/13/16 1115  NA 138 138 144 140  K 4.3 4.3 4.1 4.0  CL 112* 111 127* 124*  CO2 20*  --  10* 14*  BUN 34* 35* 26* 26*  CREATININE 1.24* 1.30* 1.17* 1.31*  GLUCOSE 197* 191* 182* 106*    Recent Labs Lab 09/12/16 0137  09/12/16 1610 09/13/16 0320 09/13/16 1115    HGB 9.0*  < > 8.5* 8.3* 7.2*  HCT 27.7*  < > 24.7* 23.3* 20.4*  WBC 8.3  --  22.8*  --   --   PLT 157  --  49*  --   --   < > = values in this interval not displayed. Nm Gi Blood Loss  Result Date: 09/12/2016 CLINICAL DATA:  Gastrointestinal bleeding. EXAM: NUCLEAR MEDICINE GASTROINTESTINAL BLEEDING SCAN TECHNIQUE: Sequential abdominal images were obtained following intravenous administration of Tc-51m labeled red blood cells. RADIOPHARMACEUTICALS:  20.1 mCi Tc-39m in-vitro labeled red cells. COMPARISON:  None. FINDINGS: Immediately upon imaging, tagged red blood cells localized to the RIGHT lower quadrant. More delayed imaging demonstrates tagged red blood cells advancing to the transverse colon and LEFT colon. IMPRESSION: Active gastrointestinal bleeding initiating in the cecum / ascending colon. Findings conveyed to Archer Asa, MD on 09/12/2016  at09:39. Electronically Signed   By: Genevive Bi M.D.   On: 09/12/2016 09:40   Ir Angiogram Visceral Selective  Result Date: 09/12/2016 INDICATION: 82 year old female with large volume active lower GI bleed requiring transfusion. Nuclear medicine tagged red blood cell study localizes the bleed to the region of the cecum/ascending colon. Additionally, she has hypotension not responding to fluid resuscitation. She requires more blood products. We will place an emergent central line, administer more blood and perform visceral angiography with embolization if the bleeding source can be identified. EXAM: SELECTIVE VISCERAL ARTERIOGRAPHY; IR RIGHT FLOURO GUIDE CV LINE; IR ULTRASOUND GUIDANCE VASC ACCESS RIGHT; ADDITIONAL ARTERIOGRAPHY; IR EMBO ART  VEN HEMORR LYMPH EXTRAV INC GUIDE ROADMAPPING 1. Ultrasound-guided access right internal jugular vein 2. Placement of a right IJ central venous catheter 3. Ultrasound-guided access right common femoral artery 4. Catheterization superior mesenteric artery with arteriogram 5. Catheterization middle colic artery with  arteriogram 6. Catheterization right branch of the middle colic artery with arteriogram 7. Catheterization marginal artery with arteriogram 8. Catheterization of an un-named ascending colonic branch artery with arteriogram 9. Coil embolization of unnamed ascending colonic branch artery 10. Catheterization of the right colic artery with arteriogram Interventional Radiologist:  Sterling Big, MD MEDICATIONS: 1 unit packed red blood cells administered during the course of the procedure. ANESTHESIA/SEDATION: Fentanyl 0 mcg IV; Versed 3 mg IV Moderate Sedation Time:  59 minutes The patient was continuously monitored during the procedure by the interventional radiology nurse under my direct supervision. CONTRAST:  25mL ISOVUE-300 IOPAMIDOL (ISOVUE-300) INJECTION 61%, 65mL ISOVUE-300 IOPAMIDOL (ISOVUE-300) INJECTION 61% FLUOROSCOPY TIME:  Fluoroscopy Time: 16 minutes 30 seconds (777 mGy). COMPLICATIONS: None immediate. PROCEDURE: Informed consent was obtained from the patient following explanation of the procedure, risks, benefits and alternatives. The patient understands, agrees and consents for the procedure. All questions were addressed. A time out was performed. Maximal barrier sterile technique utilized including caps, mask, sterile gowns, sterile gloves, large sterile drape, hand hygiene, and chlorhexidine skin prep. Attention was first turned to the right neck to establish central venous access. The right internal jugular vein was interrogated with ultrasound and found to be widely patent. An image was obtained and stored for the medical record. Local anesthesia was attained by infiltration with 1% lidocaine. A small dermatotomy was made. Under real-time sonographic guidance, the vessel was punctured with an 18 gauge needle. Using standard technique, the needle was exchanged over a 0.013 inch wire and the skin tract was dilated. A then, an Arrow triple-lumen catheter was advanced over the wire and position  with the tip at the cavoatrial junction. The catheter flushed and aspirated with ease. It was flushed, capped and secured to the skin with Prolene suture. A sterile bandage was applied. Attention was then turned to the right groin. The right common femoral artery was interrogated with ultrasound and found to be widely patent. An image was obtained and stored for the medical record. Local anesthesia was attained by infiltration with 1% lidocaine. A small dermatotomy was made. Under real-time sonographic guidance, the vessel was punctured with a 21 gauge micropuncture needle. Using standard technique, the initial micro needle was exchanged over a 0.018 micro wire for a transitional 4 [place] micro sheath. The micro sheath was then exchanged over a 0.035 wire for a working 5 [place] vascular sheath. A C2 cobra catheter was then advanced over the Bentson wire into the abdominal aorta. The catheter was used to select the superior mesenteric artery. A superior mesenteric arteriogram was performed. There is obvious active extravasation occurring in the proximal to mid ascending colon from a visible branch artery arising from the marginal artery. A renegade STC micro catheter was then advanced over a Fathom wire and used to select the middle colic artery. An arteriogram was performed confirming the vascular anatomy. The micro catheter was then navigated into the right branch of the middle colic artery. Arteriogram was again performed confirming that this vessel is ultimately contiguous with the site of active bleeding. The micro catheter was then advanced into the marginal artery and arteriogram was performed. Finally, the micro catheter was advanced into the unnamed ascending colonic branch artery. Contrast injection confirms rapid  active bleeding into the ascending colon. Coil embolization was performed using a combination of 2 x 40 mm fibered and soft interlock detachable coils. Post embolization arteriogram demonstrates  successful embolization. There is trace revascularization of the distal most aspect of the bleeding artery secondary to collateral per fusion but this is minimal and only visible during high-pressure injection. To assess for other treatable sources of collateral supply, the micro catheter was brought back into the superior mesenteric artery and advanced into the right colic artery. Arteriography was performed. No evidence of active extravasation from any of the distal branches of the right colic artery. The catheters were removed. A limited right common femoral arteriogram was performed confirming common femoral arterial access. Hemostasis was attained with the assistance of a Cordis Exoseal extra arterial vascular plug. IMPRESSION: 1. Successful placement of a right IJ approach triple-lumen central venous catheter. The catheter tip is at the cavoatrial junction and ready for use. 2. Angiography demonstrates active brisk bleeding from a branch of the marginal artery in the proximal to mid ascending colon. 3. Successful coil embolization of the actively bleeding artery. Signed, Sterling Big, MD [facility] Radiology Electronically Signed   By: Malachy Moan M.D.   On: 09/12/2016 12:28   Ir Angiogram Selective Each Additional Vessel  Result Date: 09/13/2016 INDICATION: 82 year old female with large volume active lower GI bleed requiring transfusion. Nuclear medicine tagged red blood cell study localizes the bleed to the region of the cecum/ascending colon. Additionally, she has hypotension not responding to fluid resuscitation. She requires more blood products. We will place an emergent central line, administer more blood and perform visceral angiography with embolization if the bleeding source can be identified. EXAM: SELECTIVE VISCERAL ARTERIOGRAPHY; IR RIGHT FLOURO GUIDE CV LINE; IR ULTRASOUND GUIDANCE VASC ACCESS RIGHT; ADDITIONAL ARTERIOGRAPHY; IR  EMBO ART VEN HEMORR LYMPH EXTRAV INC GUIDE ROADMAPPING 1. Ultrasound-guided access right internal jugular vein 2. Placement of a right IJ central venous catheter 3. Ultrasound-guided access right common femoral artery 4. Catheterization superior mesenteric artery with arteriogram 5. Catheterization middle colic artery with arteriogram 6. Catheterization right branch of the middle colic artery with arteriogram 7. Catheterization marginal artery with arteriogram 8. Catheterization of an un-named ascending colonic branch artery with arteriogram 9. Coil embolization of unnamed ascending colonic branch artery 10. Catheterization of the right colic artery with arteriogram Interventional Radiologist:  Sterling Big, MD MEDICATIONS: 1 unit packed red blood cells administered during the course of the procedure. ANESTHESIA/SEDATION: Fentanyl 0 mcg IV; Versed 3 mg IV Moderate Sedation Time:  59 minutes The patient was continuously monitored during the procedure by the interventional radiology nurse under my direct supervision. CONTRAST:  25mL ISOVUE-300 IOPAMIDOL (ISOVUE-300) INJECTION 61%, 65mL ISOVUE-300 IOPAMIDOL (ISOVUE-300) INJECTION 61% FLUOROSCOPY TIME:  Fluoroscopy Time: 16 minutes 30 seconds (777 mGy). COMPLICATIONS: None immediate. PROCEDURE: Informed consent was obtained from the patient following explanation of the procedure, risks, benefits and alternatives. The patient understands, agrees and consents for the procedure. All questions were addressed. A time out was performed. Maximal barrier sterile technique utilized including caps, mask, sterile gowns, sterile gloves, large sterile drape, hand hygiene, and chlorhexidine skin prep. Attention was first turned to the right neck to establish central venous access. The right internal jugular vein was interrogated with ultrasound and found to be widely patent. An image was obtained and stored for the medical record. Local anesthesia was attained by infiltration  with 1% lidocaine. A small dermatotomy was made. Under real-time sonographic guidance,  the vessel was punctured with an 18 gauge needle. Using standard technique, the needle was exchanged over a 0.013 inch wire and the skin tract was dilated. A then, an Arrow triple-lumen catheter was advanced over the wire and position with the tip at the cavoatrial junction. The catheter flushed and aspirated with ease. It was flushed, capped and secured to the skin with Prolene suture. A sterile bandage was applied. Attention was then turned to the right groin. The right common femoral artery was interrogated with ultrasound and found to be widely patent. An image was obtained and stored for the medical record. Local anesthesia was attained by infiltration with 1% lidocaine. A small dermatotomy was made. Under real-time sonographic guidance, the vessel was punctured with a 21 gauge micropuncture needle. Using standard technique, the initial micro needle was exchanged over a 0.018 micro wire for a transitional 4 [place] micro sheath. The micro sheath was then exchanged over a 0.035 wire for a working 5 [place] vascular sheath. A C2 cobra catheter was then advanced over the Bentson wire into the abdominal aorta. The catheter was used to select the superior mesenteric artery. A superior mesenteric arteriogram was performed. There is obvious active extravasation occurring in the proximal to mid ascending colon from a visible branch artery arising from the marginal artery. A renegade STC micro catheter was then advanced over a Fathom wire and used to select the middle colic artery. An arteriogram was performed confirming the vascular anatomy. The micro catheter was then navigated into the right branch of the middle colic artery. Arteriogram was again performed confirming that this vessel is ultimately contiguous with the site of active bleeding. The micro catheter was then advanced into the marginal artery and arteriogram was performed.  Finally, the micro catheter was advanced into the unnamed ascending colonic branch artery. Contrast injection confirms rapid active bleeding into the ascending colon. Coil embolization was performed using a combination of 2 x 40 mm fibered and soft interlock detachable coils. Post embolization arteriogram demonstrates successful embolization. There is trace revascularization of the distal most aspect of the bleeding artery secondary to collateral per fusion but this is minimal and only visible during high-pressure injection. To assess for other treatable sources of collateral supply, the micro catheter was brought back into the superior mesenteric artery and advanced into the right colic artery. Arteriography was performed. No evidence of active extravasation from any of the distal branches of the right colic artery. The catheters were removed. A limited right common femoral arteriogram was performed confirming common femoral arterial access. Hemostasis was attained with the assistance of a Cordis Exoseal extra arterial vascular plug. IMPRESSION: 1. Successful placement of a right IJ approach triple-lumen central venous catheter. The catheter tip is at the cavoatrial junction and ready for use. 2. Angiography demonstrates active brisk bleeding from a branch of the marginal artery in the proximal to mid ascending colon. 3. Successful coil embolization of the actively bleeding artery. Signed, Sterling Big, MD [facility] Radiology Electronically Signed   By: Malachy Moan M.D.   On: 09/12/2016 12:28   Ir Angiogram Selective Each Additional Vessel  Result Date: 09/12/2016 INDICATION: 82 year old female with large volume active lower GI bleed requiring transfusion. Nuclear medicine tagged red blood cell study localizes the bleed to the region of the cecum/ascending colon. Additionally, she has hypotension not responding to fluid resuscitation. She requires  more blood products. We will place an emergent central line, administer more blood and perform visceral  angiography with embolization if the bleeding source can be identified. EXAM: SELECTIVE VISCERAL ARTERIOGRAPHY; IR RIGHT FLOURO GUIDE CV LINE; IR ULTRASOUND GUIDANCE VASC ACCESS RIGHT; ADDITIONAL ARTERIOGRAPHY; IR EMBO ART VEN HEMORR LYMPH EXTRAV INC GUIDE ROADMAPPING 1. Ultrasound-guided access right internal jugular vein 2. Placement of a right IJ central venous catheter 3. Ultrasound-guided access right common femoral artery 4. Catheterization superior mesenteric artery with arteriogram 5. Catheterization middle colic artery with arteriogram 6. Catheterization right branch of the middle colic artery with arteriogram 7. Catheterization marginal artery with arteriogram 8. Catheterization of an un-named ascending colonic branch artery with arteriogram 9. Coil embolization of unnamed ascending colonic branch artery 10. Catheterization of the right colic artery with arteriogram Interventional Radiologist:  Sterling Big, MD MEDICATIONS: 1 unit packed red blood cells administered during the course of the procedure. ANESTHESIA/SEDATION: Fentanyl 0 mcg IV; Versed 3 mg IV Moderate Sedation Time:  59 minutes The patient was continuously monitored during the procedure by the interventional radiology nurse under my direct supervision. CONTRAST:  25mL ISOVUE-300 IOPAMIDOL (ISOVUE-300) INJECTION 61%, 65mL ISOVUE-300 IOPAMIDOL (ISOVUE-300) INJECTION 61% FLUOROSCOPY TIME:  Fluoroscopy Time: 16 minutes 30 seconds (777 mGy). COMPLICATIONS: None immediate. PROCEDURE: Informed consent was obtained from the patient following explanation of the procedure, risks, benefits and alternatives. The patient understands, agrees and consents for the procedure. All questions were addressed. A time out was performed. Maximal barrier sterile technique utilized including caps, mask, sterile gowns, sterile gloves, large sterile drape, hand  hygiene, and chlorhexidine skin prep. Attention was first turned to the right neck to establish central venous access. The right internal jugular vein was interrogated with ultrasound and found to be widely patent. An image was obtained and stored for the medical record. Local anesthesia was attained by infiltration with 1% lidocaine. A small dermatotomy was made. Under real-time sonographic guidance, the vessel was punctured with an 18 gauge needle. Using standard technique, the needle was exchanged over a 0.013 inch wire and the skin tract was dilated. A then, an Arrow triple-lumen catheter was advanced over the wire and position with the tip at the cavoatrial junction. The catheter flushed and aspirated with ease. It was flushed, capped and secured to the skin with Prolene suture. A sterile bandage was applied. Attention was then turned to the right groin. The right common femoral artery was interrogated with ultrasound and found to be widely patent. An image was obtained and stored for the medical record. Local anesthesia was attained by infiltration with 1% lidocaine. A small dermatotomy was made. Under real-time sonographic guidance, the vessel was punctured with a 21 gauge micropuncture needle. Using standard technique, the initial micro needle was exchanged over a 0.018 micro wire for a transitional 4 [place] micro sheath. The micro sheath was then exchanged over a 0.035 wire for a working 5 [place] vascular sheath. A C2 cobra catheter was then advanced over the Bentson wire into the abdominal aorta. The catheter was used to select the superior mesenteric artery. A superior mesenteric arteriogram was performed. There is obvious active extravasation occurring in the proximal to mid ascending colon from a visible branch artery arising from the marginal artery. A renegade STC micro catheter was then advanced over a Fathom wire and used to select the middle colic artery. An arteriogram was performed confirming the  vascular anatomy. The micro catheter was then navigated into the right branch of the middle colic artery. Arteriogram was again performed confirming that this vessel is ultimately contiguous with the site of active bleeding.  The micro catheter was then advanced into the marginal artery and arteriogram was performed. Finally, the micro catheter was advanced into the unnamed ascending colonic branch artery. Contrast injection confirms rapid active bleeding into the ascending colon. Coil embolization was performed using a combination of 2 x 40 mm fibered and soft interlock detachable coils. Post embolization arteriogram demonstrates successful embolization. There is trace revascularization of the distal most aspect of the bleeding artery secondary to collateral per fusion but this is minimal and only visible during high-pressure injection. To assess for other treatable sources of collateral supply, the micro catheter was brought back into the superior mesenteric artery and advanced into the right colic artery. Arteriography was performed. No evidence of active extravasation from any of the distal branches of the right colic artery. The catheters were removed. A limited right common femoral arteriogram was performed confirming common femoral arterial access. Hemostasis was attained with the assistance of a Cordis Exoseal extra arterial vascular plug. IMPRESSION: 1. Successful placement of a right IJ approach triple-lumen central venous catheter. The catheter tip is at the cavoatrial junction and ready for use. 2. Angiography demonstrates active brisk bleeding from a branch of the marginal artery in the proximal to mid ascending colon. 3. Successful coil embolization of the actively bleeding artery. Signed, Sterling Big, MD [facility] Radiology Electronically Signed   By: Malachy Moan M.D.   On: 09/12/2016 12:28   Ir Angiogram Selective Each Additional  Vessel  Result Date: 09/12/2016 INDICATION: 82 year old female with large volume active lower GI bleed requiring transfusion. Nuclear medicine tagged red blood cell study localizes the bleed to the region of the cecum/ascending colon. Additionally, she has hypotension not responding to fluid resuscitation. She requires more blood products. We will place an emergent central line, administer more blood and perform visceral angiography with embolization if the bleeding source can be identified. EXAM: SELECTIVE VISCERAL ARTERIOGRAPHY; IR RIGHT FLOURO GUIDE CV LINE; IR ULTRASOUND GUIDANCE VASC ACCESS RIGHT; ADDITIONAL ARTERIOGRAPHY; IR EMBO ART VEN HEMORR LYMPH EXTRAV INC GUIDE ROADMAPPING 1. Ultrasound-guided access right internal jugular vein 2. Placement of a right IJ central venous catheter 3. Ultrasound-guided access right common femoral artery 4. Catheterization superior mesenteric artery with arteriogram 5. Catheterization middle colic artery with arteriogram 6. Catheterization right branch of the middle colic artery with arteriogram 7. Catheterization marginal artery with arteriogram 8. Catheterization of an un-named ascending colonic branch artery with arteriogram 9. Coil embolization of unnamed ascending colonic branch artery 10. Catheterization of the right colic artery with arteriogram Interventional Radiologist:  Sterling Big, MD MEDICATIONS: 1 unit packed red blood cells administered during the course of the procedure. ANESTHESIA/SEDATION: Fentanyl 0 mcg IV; Versed 3 mg IV Moderate Sedation Time:  59 minutes The patient was continuously monitored during the procedure by the interventional radiology nurse under my direct supervision. CONTRAST:  25mL ISOVUE-300 IOPAMIDOL (ISOVUE-300) INJECTION 61%, 65mL ISOVUE-300 IOPAMIDOL (ISOVUE-300) INJECTION 61% FLUOROSCOPY TIME:  Fluoroscopy Time: 16 minutes 30 seconds (777 mGy). COMPLICATIONS: None immediate. PROCEDURE: Informed consent was obtained from the  patient following explanation of the procedure, risks, benefits and alternatives. The patient understands, agrees and consents for the procedure. All questions were addressed. A time out was performed. Maximal barrier sterile technique utilized including caps, mask, sterile gowns, sterile gloves, large sterile drape, hand hygiene, and chlorhexidine skin prep. Attention was first turned to the right neck to establish central venous access. The right internal jugular vein was interrogated with ultrasound and found to be  widely patent. An image was obtained and stored for the medical record. Local anesthesia was attained by infiltration with 1% lidocaine. A small dermatotomy was made. Under real-time sonographic guidance, the vessel was punctured with an 18 gauge needle. Using standard technique, the needle was exchanged over a 0.013 inch wire and the skin tract was dilated. A then, an Arrow triple-lumen catheter was advanced over the wire and position with the tip at the cavoatrial junction. The catheter flushed and aspirated with ease. It was flushed, capped and secured to the skin with Prolene suture. A sterile bandage was applied. Attention was then turned to the right groin. The right common femoral artery was interrogated with ultrasound and found to be widely patent. An image was obtained and stored for the medical record. Local anesthesia was attained by infiltration with 1% lidocaine. A small dermatotomy was made. Under real-time sonographic guidance, the vessel was punctured with a 21 gauge micropuncture needle. Using standard technique, the initial micro needle was exchanged over a 0.018 micro wire for a transitional 4 [place] micro sheath. The micro sheath was then exchanged over a 0.035 wire for a working 5 [place] vascular sheath. A C2 cobra catheter was then advanced over the Bentson wire into the abdominal aorta. The catheter was used to select the superior mesenteric artery. A superior mesenteric  arteriogram was performed. There is obvious active extravasation occurring in the proximal to mid ascending colon from a visible branch artery arising from the marginal artery. A renegade STC micro catheter was then advanced over a Fathom wire and used to select the middle colic artery. An arteriogram was performed confirming the vascular anatomy. The micro catheter was then navigated into the right branch of the middle colic artery. Arteriogram was again performed confirming that this vessel is ultimately contiguous with the site of active bleeding. The micro catheter was then advanced into the marginal artery and arteriogram was performed. Finally, the micro catheter was advanced into the unnamed ascending colonic branch artery. Contrast injection confirms rapid active bleeding into the ascending colon. Coil embolization was performed using a combination of 2 x 40 mm fibered and soft interlock detachable coils. Post embolization arteriogram demonstrates successful embolization. There is trace revascularization of the distal most aspect of the bleeding artery secondary to collateral per fusion but this is minimal and only visible during high-pressure injection. To assess for other treatable sources of collateral supply, the micro catheter was brought back into the superior mesenteric artery and advanced into the right colic artery. Arteriography was performed. No evidence of active extravasation from any of the distal branches of the right colic artery. The catheters were removed. A limited right common femoral arteriogram was performed confirming common femoral arterial access. Hemostasis was attained with the assistance of a Cordis Exoseal extra arterial vascular plug. IMPRESSION: 1. Successful placement of a right IJ approach triple-lumen central venous catheter. The catheter tip is at the cavoatrial junction and ready for use. 2. Angiography demonstrates active brisk bleeding from a branch of the marginal  artery in the proximal to mid ascending colon. 3. Successful coil embolization of the actively bleeding artery. Signed, Sterling Big, MD [facility] Radiology Electronically Signed   By: Malachy Moan M.D.   On: 09/12/2016 12:28   Ir Angiogram Selective Each Additional Vessel  Result Date: 09/12/2016 INDICATION: 82 year old female with large volume active lower GI bleed requiring transfusion. Nuclear medicine tagged red blood cell study localizes the bleed to the region of  the cecum/ascending colon. Additionally, she has hypotension not responding to fluid resuscitation. She requires more blood products. We will place an emergent central line, administer more blood and perform visceral angiography with embolization if the bleeding source can be identified. EXAM: SELECTIVE VISCERAL ARTERIOGRAPHY; IR RIGHT FLOURO GUIDE CV LINE; IR ULTRASOUND GUIDANCE VASC ACCESS RIGHT; ADDITIONAL ARTERIOGRAPHY; IR EMBO ART VEN HEMORR LYMPH EXTRAV INC GUIDE ROADMAPPING 1. Ultrasound-guided access right internal jugular vein 2. Placement of a right IJ central venous catheter 3. Ultrasound-guided access right common femoral artery 4. Catheterization superior mesenteric artery with arteriogram 5. Catheterization middle colic artery with arteriogram 6. Catheterization right branch of the middle colic artery with arteriogram 7. Catheterization marginal artery with arteriogram 8. Catheterization of an un-named ascending colonic branch artery with arteriogram 9. Coil embolization of unnamed ascending colonic branch artery 10. Catheterization of the right colic artery with arteriogram Interventional Radiologist:  Sterling Big, MD MEDICATIONS: 1 unit packed red blood cells administered during the course of the procedure. ANESTHESIA/SEDATION: Fentanyl 0 mcg IV; Versed 3 mg IV Moderate Sedation Time:  59 minutes The patient was continuously monitored during the procedure by  the interventional radiology nurse under my direct supervision. CONTRAST:  25mL ISOVUE-300 IOPAMIDOL (ISOVUE-300) INJECTION 61%, 65mL ISOVUE-300 IOPAMIDOL (ISOVUE-300) INJECTION 61% FLUOROSCOPY TIME:  Fluoroscopy Time: 16 minutes 30 seconds (777 mGy). COMPLICATIONS: None immediate. PROCEDURE: Informed consent was obtained from the patient following explanation of the procedure, risks, benefits and alternatives. The patient understands, agrees and consents for the procedure. All questions were addressed. A time out was performed. Maximal barrier sterile technique utilized including caps, mask, sterile gowns, sterile gloves, large sterile drape, hand hygiene, and chlorhexidine skin prep. Attention was first turned to the right neck to establish central venous access. The right internal jugular vein was interrogated with ultrasound and found to be widely patent. An image was obtained and stored for the medical record. Local anesthesia was attained by infiltration with 1% lidocaine. A small dermatotomy was made. Under real-time sonographic guidance, the vessel was punctured with an 18 gauge needle. Using standard technique, the needle was exchanged over a 0.013 inch wire and the skin tract was dilated. A then, an Arrow triple-lumen catheter was advanced over the wire and position with the tip at the cavoatrial junction. The catheter flushed and aspirated with ease. It was flushed, capped and secured to the skin with Prolene suture. A sterile bandage was applied. Attention was then turned to the right groin. The right common femoral artery was interrogated with ultrasound and found to be widely patent. An image was obtained and stored for the medical record. Local anesthesia was attained by infiltration with 1% lidocaine. A small dermatotomy was made. Under real-time sonographic guidance, the vessel was punctured with a 21 gauge micropuncture needle. Using standard technique, the initial micro needle was exchanged over a  0.018 micro wire for a transitional 4 [place] micro sheath. The micro sheath was then exchanged over a 0.035 wire for a working 5 [place] vascular sheath. A C2 cobra catheter was then advanced over the Bentson wire into the abdominal aorta. The catheter was used to select the superior mesenteric artery. A superior mesenteric arteriogram was performed. There is obvious active extravasation occurring in the proximal to mid ascending colon from a visible branch artery arising from the marginal artery. A renegade STC micro catheter was then advanced over a Fathom wire and used to select the middle colic artery. An arteriogram was performed confirming the vascular anatomy. The micro  catheter was then navigated into the right branch of the middle colic artery. Arteriogram was again performed confirming that this vessel is ultimately contiguous with the site of active bleeding. The micro catheter was then advanced into the marginal artery and arteriogram was performed. Finally, the micro catheter was advanced into the unnamed ascending colonic branch artery. Contrast injection confirms rapid active bleeding into the ascending colon. Coil embolization was performed using a combination of 2 x 40 mm fibered and soft interlock detachable coils. Post embolization arteriogram demonstrates successful embolization. There is trace revascularization of the distal most aspect of the bleeding artery secondary to collateral per fusion but this is minimal and only visible during high-pressure injection. To assess for other treatable sources of collateral supply, the micro catheter was brought back into the superior mesenteric artery and advanced into the right colic artery. Arteriography was performed. No evidence of active extravasation from any of the distal branches of the right colic artery. The catheters were removed. A limited right common femoral arteriogram was performed confirming common femoral arterial access. Hemostasis was  attained with the assistance of a Cordis Exoseal extra arterial vascular plug. IMPRESSION: 1. Successful placement of a right IJ approach triple-lumen central venous catheter. The catheter tip is at the cavoatrial junction and ready for use. 2. Angiography demonstrates active brisk bleeding from a branch of the marginal artery in the proximal to mid ascending colon. 3. Successful coil embolization of the actively bleeding artery. Signed, Sterling Big, MD [facility] Radiology Electronically Signed   By: Malachy Moan M.D.   On: 09/12/2016 12:28   Ir Angiogram Selective Each Additional Vessel  Result Date: 09/12/2016 INDICATION: 82 year old female with large volume active lower GI bleed requiring transfusion. Nuclear medicine tagged red blood cell study localizes the bleed to the region of the cecum/ascending colon. Additionally, she has hypotension not responding to fluid resuscitation. She requires more blood products. We will place an emergent central line, administer more blood and perform visceral angiography with embolization if the bleeding source can be identified. EXAM: SELECTIVE VISCERAL ARTERIOGRAPHY; IR RIGHT FLOURO GUIDE CV LINE; IR ULTRASOUND GUIDANCE VASC ACCESS RIGHT; ADDITIONAL ARTERIOGRAPHY; IR EMBO ART VEN HEMORR LYMPH EXTRAV INC GUIDE ROADMAPPING 1. Ultrasound-guided access right internal jugular vein 2. Placement of a right IJ central venous catheter 3. Ultrasound-guided access right common femoral artery 4. Catheterization superior mesenteric artery with arteriogram 5. Catheterization middle colic artery with arteriogram 6. Catheterization right branch of the middle colic artery with arteriogram 7. Catheterization marginal artery with arteriogram 8. Catheterization of an un-named ascending colonic branch artery with arteriogram 9. Coil embolization of unnamed ascending colonic branch artery 10. Catheterization of the right colic  artery with arteriogram Interventional Radiologist:  Sterling Big, MD MEDICATIONS: 1 unit packed red blood cells administered during the course of the procedure. ANESTHESIA/SEDATION: Fentanyl 0 mcg IV; Versed 3 mg IV Moderate Sedation Time:  59 minutes The patient was continuously monitored during the procedure by the interventional radiology nurse under my direct supervision. CONTRAST:  25mL ISOVUE-300 IOPAMIDOL (ISOVUE-300) INJECTION 61%, 65mL ISOVUE-300 IOPAMIDOL (ISOVUE-300) INJECTION 61% FLUOROSCOPY TIME:  Fluoroscopy Time: 16 minutes 30 seconds (777 mGy). COMPLICATIONS: None immediate. PROCEDURE: Informed consent was obtained from the patient following explanation of the procedure, risks, benefits and alternatives. The patient understands, agrees and consents for the procedure. All questions were addressed. A time out was performed. Maximal barrier sterile technique utilized including caps, mask, sterile gowns, sterile gloves, large sterile drape, hand hygiene,  and chlorhexidine skin prep. Attention was first turned to the right neck to establish central venous access. The right internal jugular vein was interrogated with ultrasound and found to be widely patent. An image was obtained and stored for the medical record. Local anesthesia was attained by infiltration with 1% lidocaine. A small dermatotomy was made. Under real-time sonographic guidance, the vessel was punctured with an 18 gauge needle. Using standard technique, the needle was exchanged over a 0.013 inch wire and the skin tract was dilated. A then, an Arrow triple-lumen catheter was advanced over the wire and position with the tip at the cavoatrial junction. The catheter flushed and aspirated with ease. It was flushed, capped and secured to the skin with Prolene suture. A sterile bandage was applied. Attention was then turned to the right groin. The right common femoral artery was interrogated with ultrasound and found to be widely patent.  An image was obtained and stored for the medical record. Local anesthesia was attained by infiltration with 1% lidocaine. A small dermatotomy was made. Under real-time sonographic guidance, the vessel was punctured with a 21 gauge micropuncture needle. Using standard technique, the initial micro needle was exchanged over a 0.018 micro wire for a transitional 4 [place] micro sheath. The micro sheath was then exchanged over a 0.035 wire for a working 5 [place] vascular sheath. A C2 cobra catheter was then advanced over the Bentson wire into the abdominal aorta. The catheter was used to select the superior mesenteric artery. A superior mesenteric arteriogram was performed. There is obvious active extravasation occurring in the proximal to mid ascending colon from a visible branch artery arising from the marginal artery. A renegade STC micro catheter was then advanced over a Fathom wire and used to select the middle colic artery. An arteriogram was performed confirming the vascular anatomy. The micro catheter was then navigated into the right branch of the middle colic artery. Arteriogram was again performed confirming that this vessel is ultimately contiguous with the site of active bleeding. The micro catheter was then advanced into the marginal artery and arteriogram was performed. Finally, the micro catheter was advanced into the unnamed ascending colonic branch artery. Contrast injection confirms rapid active bleeding into the ascending colon. Coil embolization was performed using a combination of 2 x 40 mm fibered and soft interlock detachable coils. Post embolization arteriogram demonstrates successful embolization. There is trace revascularization of the distal most aspect of the bleeding artery secondary to collateral per fusion but this is minimal and only visible during high-pressure injection. To assess for other treatable sources of collateral supply, the micro catheter was brought back into the superior  mesenteric artery and advanced into the right colic artery. Arteriography was performed. No evidence of active extravasation from any of the distal branches of the right colic artery. The catheters were removed. A limited right common femoral arteriogram was performed confirming common femoral arterial access. Hemostasis was attained with the assistance of a Cordis Exoseal extra arterial vascular plug. IMPRESSION: 1. Successful placement of a right IJ approach triple-lumen central venous catheter. The catheter tip is at the cavoatrial junction and ready for use. 2. Angiography demonstrates active brisk bleeding from a branch of the marginal artery in the proximal to mid ascending colon. 3. Successful coil embolization of the actively bleeding artery. Signed, Sterling Big, MD [facility] Radiology Electronically Signed   By: Malachy Moan M.D.   On: 09/12/2016 12:28   Ir Fluoro Guide Cv Line Right  Result Date: 09/12/2016 INDICATION: 82 year old female with large volume active lower GI bleed requiring transfusion. Nuclear medicine tagged red blood cell study localizes the bleed to the region of the cecum/ascending colon. Additionally, she has hypotension not responding to fluid resuscitation. She requires more blood products. We will place an emergent central line, administer more blood and perform visceral angiography with embolization if the bleeding source can be identified. EXAM: SELECTIVE VISCERAL ARTERIOGRAPHY; IR RIGHT FLOURO GUIDE CV LINE; IR ULTRASOUND GUIDANCE VASC ACCESS RIGHT; ADDITIONAL ARTERIOGRAPHY; IR EMBO ART VEN HEMORR LYMPH EXTRAV INC GUIDE ROADMAPPING 1. Ultrasound-guided access right internal jugular vein 2. Placement of a right IJ central venous catheter 3. Ultrasound-guided access right common femoral artery 4. Catheterization superior mesenteric artery with arteriogram 5. Catheterization middle colic artery with arteriogram 6.  Catheterization right branch of the middle colic artery with arteriogram 7. Catheterization marginal artery with arteriogram 8. Catheterization of an un-named ascending colonic branch artery with arteriogram 9. Coil embolization of unnamed ascending colonic branch artery 10. Catheterization of the right colic artery with arteriogram Interventional Radiologist:  Sterling Big, MD MEDICATIONS: 1 unit packed red blood cells administered during the course of the procedure. ANESTHESIA/SEDATION: Fentanyl 0 mcg IV; Versed 3 mg IV Moderate Sedation Time:  59 minutes The patient was continuously monitored during the procedure by the interventional radiology nurse under my direct supervision. CONTRAST:  25mL ISOVUE-300 IOPAMIDOL (ISOVUE-300) INJECTION 61%, 65mL ISOVUE-300 IOPAMIDOL (ISOVUE-300) INJECTION 61% FLUOROSCOPY TIME:  Fluoroscopy Time: 16 minutes 30 seconds (777 mGy). COMPLICATIONS: None immediate. PROCEDURE: Informed consent was obtained from the patient following explanation of the procedure, risks, benefits and alternatives. The patient understands, agrees and consents for the procedure. All questions were addressed. A time out was performed. Maximal barrier sterile technique utilized including caps, mask, sterile gowns, sterile gloves, large sterile drape, hand hygiene, and chlorhexidine skin prep. Attention was first turned to the right neck to establish central venous access. The right internal jugular vein was interrogated with ultrasound and found to be widely patent. An image was obtained and stored for the medical record. Local anesthesia was attained by infiltration with 1% lidocaine. A small dermatotomy was made. Under real-time sonographic guidance, the vessel was punctured with an 18 gauge needle. Using standard technique, the needle was exchanged over a 0.013 inch wire and the skin tract was dilated. A then, an Arrow triple-lumen catheter was advanced over the wire and position with the tip at  the cavoatrial junction. The catheter flushed and aspirated with ease. It was flushed, capped and secured to the skin with Prolene suture. A sterile bandage was applied. Attention was then turned to the right groin. The right common femoral artery was interrogated with ultrasound and found to be widely patent. An image was obtained and stored for the medical record. Local anesthesia was attained by infiltration with 1% lidocaine. A small dermatotomy was made. Under real-time sonographic guidance, the vessel was punctured with a 21 gauge micropuncture needle. Using standard technique, the initial micro needle was exchanged over a 0.018 micro wire for a transitional 4 [place] micro sheath. The micro sheath was then exchanged over a 0.035 wire for a working 5 [place] vascular sheath. A C2 cobra catheter was then advanced over the Bentson wire into the abdominal aorta. The catheter was used to select the superior mesenteric artery. A superior mesenteric arteriogram was performed. There is obvious active extravasation occurring in the proximal to mid ascending colon from a visible branch artery arising from the marginal artery. A  renegade STC micro catheter was then advanced over a Fathom wire and used to select the middle colic artery. An arteriogram was performed confirming the vascular anatomy. The micro catheter was then navigated into the right branch of the middle colic artery. Arteriogram was again performed confirming that this vessel is ultimately contiguous with the site of active bleeding. The micro catheter was then advanced into the marginal artery and arteriogram was performed. Finally, the micro catheter was advanced into the unnamed ascending colonic branch artery. Contrast injection confirms rapid active bleeding into the ascending colon. Coil embolization was performed using a combination of 2 x 40 mm fibered and soft interlock detachable coils. Post embolization arteriogram demonstrates successful  embolization. There is trace revascularization of the distal most aspect of the bleeding artery secondary to collateral per fusion but this is minimal and only visible during high-pressure injection. To assess for other treatable sources of collateral supply, the micro catheter was brought back into the superior mesenteric artery and advanced into the right colic artery. Arteriography was performed. No evidence of active extravasation from any of the distal branches of the right colic artery. The catheters were removed. A limited right common femoral arteriogram was performed confirming common femoral arterial access. Hemostasis was attained with the assistance of a Cordis Exoseal extra arterial vascular plug. IMPRESSION: 1. Successful placement of a right IJ approach triple-lumen central venous catheter. The catheter tip is at the cavoatrial junction and ready for use. 2. Angiography demonstrates active brisk bleeding from a branch of the marginal artery in the proximal to mid ascending colon. 3. Successful coil embolization of the actively bleeding artery. Signed, Sterling BigHeath K. McCullough, MD [facility] Electronically Signed   By: Malachy MoanHeath  McCullough M.D.   On: 09/12/2016 12:28   Ir [place] Guide Vasc Access Right  Result Date: 09/12/2016 INDICATION: 82 year old female with large volume active lower GI bleed requiring transfusion. Nuclear medicine tagged red blood cell study localizes the bleed to the region of the cecum/ascending colon. Additionally, she has hypotension not responding to fluid resuscitation. She requires more blood products. We will place an emergent central line, administer more blood and perform visceral angiography with embolization if the bleeding source can be identified. EXAM: SELECTIVE VISCERAL ARTERIOGRAPHY; IR RIGHT FLOURO GUIDE CV LINE; IR ULTRASOUND GUIDANCE VASC ACCESS RIGHT; ADDITIONAL ARTERIOGRAPHY; IR EMBO ART VEN HEMORR LYMPH  EXTRAV INC GUIDE ROADMAPPING 1. Ultrasound-guided access right internal jugular vein 2. Placement of a right IJ central venous catheter 3. Ultrasound-guided access right common femoral artery 4. Catheterization superior mesenteric artery with arteriogram 5. Catheterization middle colic artery with arteriogram 6. Catheterization right branch of the middle colic artery with arteriogram 7. Catheterization marginal artery with arteriogram 8. Catheterization of an un-named ascending colonic branch artery with arteriogram 9. Coil embolization of unnamed ascending colonic branch artery 10. Catheterization of the right colic artery with arteriogram Interventional Radiologist:  Sterling BigHeath K. McCullough, MD MEDICATIONS: 1 unit packed red blood cells administered during the course of the procedure. ANESTHESIA/SEDATION: Fentanyl 0 mcg IV; Versed 3 mg IV Moderate Sedation Time:  59 minutes The patient was continuously monitored during the procedure by the interventional radiology nurse under my direct supervision. CONTRAST:  25mL ISOVUE-300 IOPAMIDOL (ISOVUE-300) INJECTION 61%, 65mL ISOVUE-300 IOPAMIDOL (ISOVUE-300) INJECTION 61% FLUOROSCOPY TIME:  Fluoroscopy Time: 16 minutes 30 seconds (777 mGy). COMPLICATIONS: None immediate. PROCEDURE: Informed consent was obtained from the patient following explanation of the procedure, risks, benefits and alternatives. The patient understands, agrees and consents  for the procedure. All questions were addressed. A time out was performed. Maximal barrier sterile technique utilized including caps, mask, sterile gowns, sterile gloves, large sterile drape, hand hygiene, and chlorhexidine skin prep. Attention was first turned to the right neck to establish central venous access. The right internal jugular vein was interrogated with ultrasound and found to be widely patent. An image was obtained and stored for the medical record. Local anesthesia was attained by infiltration with 1% lidocaine. A small  dermatotomy was made. Under real-time sonographic guidance, the vessel was punctured with an 18 gauge needle. Using standard technique, the needle was exchanged over a 0.013 inch wire and the skin tract was dilated. A then, an Arrow triple-lumen catheter was advanced over the wire and position with the tip at the cavoatrial junction. The catheter flushed and aspirated with ease. It was flushed, capped and secured to the skin with Prolene suture. A sterile bandage was applied. Attention was then turned to the right groin. The right common femoral artery was interrogated with ultrasound and found to be widely patent. An image was obtained and stored for the medical record. Local anesthesia was attained by infiltration with 1% lidocaine. A small dermatotomy was made. Under real-time sonographic guidance, the vessel was punctured with a 21 gauge micropuncture needle. Using standard technique, the initial micro needle was exchanged over a 0.018 micro wire for a transitional 4 [place] micro sheath. The micro sheath was then exchanged over a 0.035 wire for a working 5 [place] vascular sheath. A C2 cobra catheter was then advanced over the Bentson wire into the abdominal aorta. The catheter was used to select the superior mesenteric artery. A superior mesenteric arteriogram was performed. There is obvious active extravasation occurring in the proximal to mid ascending colon from a visible branch artery arising from the marginal artery. A renegade STC micro catheter was then advanced over a Fathom wire and used to select the middle colic artery. An arteriogram was performed confirming the vascular anatomy. The micro catheter was then navigated into the right branch of the middle colic artery. Arteriogram was again performed confirming that this vessel is ultimately contiguous with the site of active bleeding. The micro catheter was then advanced into the marginal artery and arteriogram was performed. Finally, the micro  catheter was advanced into the unnamed ascending colonic branch artery. Contrast injection confirms rapid active bleeding into the ascending colon. Coil embolization was performed using a combination of 2 x 40 mm fibered and soft interlock detachable coils. Post embolization arteriogram demonstrates successful embolization. There is trace revascularization of the distal most aspect of the bleeding artery secondary to collateral per fusion but this is minimal and only visible during high-pressure injection. To assess for other treatable sources of collateral supply, the micro catheter was brought back into the superior mesenteric artery and advanced into the right colic artery. Arteriography was performed. No evidence of active extravasation from any of the distal branches of the right colic artery. The catheters were removed. A limited right common femoral arteriogram was performed confirming common femoral arterial access. Hemostasis was attained with the assistance of a Cordis Exoseal extra arterial vascular plug. IMPRESSION: 1. Successful placement of a right IJ approach triple-lumen central venous catheter. The catheter tip is at the cavoatrial junction and ready for use. 2. Angiography demonstrates active brisk bleeding from a branch of the marginal artery in the proximal to mid ascending colon. 3. Successful coil embolization of the actively bleeding artery. Signed, Sterling Big, MD  [facility] Radiology Electronically Signed   By: Malachy Moan M.D.   On: 09/12/2016 12:28   Ir US Guide Vasc Access Right  Result Date: 09/12/2016 INDICATION: 82 year old female with large volume active lower GI bleed requiring transfusion. Nuclear medicine tagged red blood cell study localizes the bleed to the region of the cecum/ascending colon. Additionally, she has hypotension not responding to fluid resuscitation. She requires more blood products. We will place  an emergent central line, administer more blood and perform visceral angiography with embolization if the bleeding source can be identified. EXAM: SELECTIVE VISCERAL ARTERIOGRAPHY; IR RIGHT FLOURO GUIDE CV LINE; IR ULTRASOUND GUIDANCE VASC ACCESS RIGHT; ADDITIONAL ARTERIOGRAPHY; IR EMBO ART VEN HEMORR LYMPH EXTRAV INC GUIDE ROADMAPPING 1. Ultrasound-guided access right internal jugular vein 2. Placement of a right IJ central venous catheter 3. Ultrasound-guided access right common femoral artery 4. Catheterization superior mesenteric artery with arteriogram 5. Catheterization middle colic artery with arteriogram 6. Catheterization right branch of the middle colic artery with arteriogram 7. Catheterization marginal artery with arteriogram 8. Catheterization of an un-named ascending colonic branch artery with arteriogram 9. Coil embolization of unnamed ascending colonic branch artery 10. Catheterization of the right colic artery with arteriogram Interventional Radiologist:  Sterling Big, MD MEDICATIONS: 1 unit packed red blood cells administered during the course of the procedure. ANESTHESIA/SEDATION: Fentanyl 0 mcg IV; Versed 3 mg IV Moderate Sedation Time:  59 minutes The patient was continuously monitored during the procedure by the interventional radiology nurse under my direct supervision. CONTRAST:  25mL ISOVUE-300 IOPAMIDOL (ISOVUE-300) INJECTION 61%, 65mL ISOVUE-300 IOPAMIDOL (ISOVUE-300) INJECTION 61% FLUOROSCOPY TIME:  Fluoroscopy Time: 16 minutes 30 seconds (777 mGy). COMPLICATIONS: None immediate. PROCEDURE: Informed consent was obtained from the patient following explanation of the procedure, risks, benefits and alternatives. The patient understands, agrees and consents for the procedure. All questions were addressed. A time out was performed. Maximal barrier sterile technique utilized including caps, mask, sterile gowns, sterile gloves, large sterile drape, hand hygiene, and chlorhexidine skin  prep. Attention was first turned to the right neck to establish central venous access. The right internal jugular vein was interrogated with ultrasound and found to be widely patent. An image was obtained and stored for the medical record. Local anesthesia was attained by infiltration with 1% lidocaine. A small dermatotomy was made. Under real-time sonographic guidance, the vessel was punctured with an 18 gauge needle. Using standard technique, the needle was exchanged over a 0.013 inch wire and the skin tract was dilated. A then, an Arrow triple-lumen catheter was advanced over the wire and position with the tip at the cavoatrial junction. The catheter flushed and aspirated with ease. It was flushed, capped and secured to the skin with Prolene suture. A sterile bandage was applied. Attention was then turned to the right groin. The right common femoral artery was interrogated with ultrasound and found to be widely patent. An image was obtained and stored for the medical record. Local anesthesia was attained by infiltration with 1% lidocaine. A small dermatotomy was made. Under real-time sonographic guidance, the vessel was punctured with a 21 gauge micropuncture needle. Using standard technique, the initial micro needle was exchanged over a 0.018 micro wire for a transitional 4 [place] micro sheath. The micro sheath was then exchanged over a 0.035 wire for a working 5 [place] vascular sheath. A C2 cobra catheter was then advanced over the Bentson wire into the abdominal aorta. The catheter was used to select the superior mesenteric artery.  A superior mesenteric arteriogram was performed. There is obvious active extravasation occurring in the proximal to mid ascending colon from a visible branch artery arising from the marginal artery. A renegade STC micro catheter was then advanced over a Fathom wire and used to select the middle colic artery. An arteriogram was performed confirming the vascular anatomy. The micro  catheter was then navigated into the right branch of the middle colic artery. Arteriogram was again performed confirming that this vessel is ultimately contiguous with the site of active bleeding. The micro catheter was then advanced into the marginal artery and arteriogram was performed. Finally, the micro catheter was advanced into the unnamed ascending colonic branch artery. Contrast injection confirms rapid active bleeding into the ascending colon. Coil embolization was performed using a combination of 2 x 40 mm fibered and soft interlock detachable coils. Post embolization arteriogram demonstrates successful embolization. There is trace revascularization of the distal most aspect of the bleeding artery secondary to collateral per fusion but this is minimal and only visible during high-pressure injection. To assess for other treatable sources of collateral supply, the micro catheter was brought back into the superior mesenteric artery and advanced into the right colic artery. Arteriography was performed. No evidence of active extravasation from any of the distal branches of the right colic artery. The catheters were removed. A limited right common femoral arteriogram was performed confirming common femoral arterial access. Hemostasis was attained with the assistance of a Cordis Exoseal extra arterial vascular plug. IMPRESSION: 1. Successful placement of a right IJ approach triple-lumen central venous catheter. The catheter tip is at the cavoatrial junction and ready for use. 2. Angiography demonstrates active brisk bleeding from a branch of the marginal artery in the proximal to mid ascending colon. 3. Successful coil embolization of the actively bleeding artery. Signed, Sterling Big, MD [facility] Radiology Electronically Signed   By: Malachy Moan M.D.   On: 09/12/2016 12:28   Dg Chest Portable 1 View  Result Date: 09/12/2016 CLINICAL DATA:   Rectal bleeding since last night. Weakness. Shortness of breath. EXAM: PORTABLE CHEST 1 VIEW COMPARISON:  08/06/2015 FINDINGS: Unchanged mediastinal contours with thoracic aortic atherosclerosis. Chronic appearing increased interstitial opacities, suggesting emphysema. Calcified granuloma in the right midlung zone. No focal airspace disease, evidence of pulmonary edema, pleural effusion or pneumothorax. The bones are under mineralized. IMPRESSION: Chronic interstitial thickening.  No evidence of acute abnormality. Thoracic aortic atherosclerosis. Electronically Signed   By: Rubye Oaks M.D.   On: 09/12/2016 02:28   Ir Embo Art  [facility] Guide Roadmapping  Result Date: 09/12/2016 INDICATION: 82 year old female with large volume active lower GI bleed requiring transfusion. Nuclear medicine tagged red blood cell study localizes the bleed to the region of the cecum/ascending colon. Additionally, she has hypotension not responding to fluid resuscitation. She requires more blood products. We will place an emergent central line, administer more blood and perform visceral angiography with embolization if the bleeding source can be identified. EXAM: SELECTIVE VISCERAL ARTERIOGRAPHY; IR RIGHT FLOURO GUIDE CV LINE; IR ULTRASOUND GUIDANCE VASC ACCESS RIGHT; ADDITIONAL ARTERIOGRAPHY; IR EMBO ART VEN HEMORR LYMPH EXTRAV INC GUIDE ROADMAPPING 1. Ultrasound-guided access right internal jugular vein 2. Placement of a right IJ central venous catheter 3. Ultrasound-guided access right common femoral artery 4. Catheterization superior mesenteric artery with arteriogram 5. Catheterization middle colic artery with arteriogram 6. Catheterization right branch of the middle colic artery with arteriogram 7. Catheterization marginal artery with arteriogram  8. Catheterization of an un-named ascending colonic branch artery with arteriogram 9. Coil embolization of unnamed ascending colonic branch artery 10.  Catheterization of the right colic artery with arteriogram Interventional Radiologist:  Sterling Big, MD MEDICATIONS: 1 unit packed red blood cells administered during the course of the procedure. ANESTHESIA/SEDATION: Fentanyl 0 mcg IV; Versed 3 mg IV Moderate Sedation Time:  59 minutes The patient was continuously monitored during the procedure by the interventional radiology nurse under my direct supervision. CONTRAST:  25mL ISOVUE-300 IOPAMIDOL (ISOVUE-300) INJECTION 61%, 65mL ISOVUE-300 IOPAMIDOL (ISOVUE-300) INJECTION 61% FLUOROSCOPY TIME:  Fluoroscopy Time: 16 minutes 30 seconds (777 mGy). COMPLICATIONS: None immediate. PROCEDURE: Informed consent was obtained from the patient following explanation of the procedure, risks, benefits and alternatives. The patient understands, agrees and consents for the procedure. All questions were addressed. A time out was performed. Maximal barrier sterile technique utilized including caps, mask, sterile gowns, sterile gloves, large sterile drape, hand hygiene, and chlorhexidine skin prep. Attention was first turned to the right neck to establish central venous access. The right internal jugular vein was interrogated with ultrasound and found to be widely patent. An image was obtained and stored for the medical record. Local anesthesia was attained by infiltration with 1% lidocaine. A small dermatotomy was made. Under real-time sonographic guidance, the vessel was punctured with an 18 gauge needle. Using standard technique, the needle was exchanged over a 0.013 inch wire and the skin tract was dilated. A then, an Arrow triple-lumen catheter was advanced over the wire and position with the tip at the cavoatrial junction. The catheter flushed and aspirated with ease. It was flushed, capped and secured to the skin with Prolene suture. A sterile bandage was applied. Attention was then turned to the right groin. The right common femoral artery was interrogated with  ultrasound and found to be widely patent. An image was obtained and stored for the medical record. Local anesthesia was attained by infiltration with 1% lidocaine. A small dermatotomy was made. Under real-time sonographic guidance, the vessel was punctured with a 21 gauge micropuncture needle. Using standard technique, the initial micro needle was exchanged over a 0.018 micro wire for a transitional 4 [place] micro sheath. The micro sheath was then exchanged over a 0.035 wire for a working 5 [place] vascular sheath. A C2 cobra catheter was then advanced over the Bentson wire into the abdominal aorta. The catheter was used to select the superior mesenteric artery. A superior mesenteric arteriogram was performed. There is obvious active extravasation occurring in the proximal to mid ascending colon from a visible branch artery arising from the marginal artery. A renegade STC micro catheter was then advanced over a Fathom wire and used to select the middle colic artery. An arteriogram was performed confirming the vascular anatomy. The micro catheter was then navigated into the right branch of the middle colic artery. Arteriogram was again performed confirming that this vessel is ultimately contiguous with the site of active bleeding. The micro catheter was then advanced into the marginal artery and arteriogram was performed. Finally, the micro catheter was advanced into the unnamed ascending colonic branch artery. Contrast injection confirms rapid active bleeding into the ascending colon. Coil embolization was performed using a combination of 2 x 40 mm fibered and soft interlock detachable coils. Post embolization arteriogram demonstrates successful embolization. There is trace revascularization of the distal most aspect of the bleeding artery secondary to collateral per fusion but this is minimal and only visible during high-pressure injection. To assess for  other treatable sources of collateral supply, the micro  catheter was brought back into the superior mesenteric artery and advanced into the right colic artery. Arteriography was performed. No evidence of active extravasation from any of the distal branches of the right colic artery. The catheters were removed. A limited right common femoral arteriogram was performed confirming common femoral arterial access. Hemostasis was attained with the assistance of a Cordis Exoseal extra arterial vascular plug. IMPRESSION: 1. Successful placement of a right IJ approach triple-lumen central venous catheter. The catheter tip is at the cavoatrial junction and ready for use. 2. Angiography demonstrates active brisk bleeding from a branch of the marginal artery in the proximal to mid ascending colon. 3. Successful coil embolization of the actively bleeding artery. Signed, Sterling Big, MD [facility] Radiology Electronically Signed   By: Malachy Moan M.D.   On: 09/12/2016 12:28   SIGNIFICANT EVENTS    STUDIES:  ECHO 11/2014 >> shows moderate AS, with good LV function    ASSESSMENT / PLAN: Lower GI bleeding - possibly diverticular from the cecum status post embolization with good control of bleeding  Hemorrhagic shock - resolved with blood transfusion, hgb 8.8 after 2 units of blood, she was transfused a third unit during procedure. Right IJ central access was placed by radiology and confirmed.  Thrombocytopenia   Plan: Trend Hgb  Transfuse for Hgb <7 There is no evidence of coagulopathy and platelets seem adequate Protonix gtt  Levophed weaned off, monitor BP  Goal MAP >65 DNR/DNI GI following   Patient and family updated on plan of care 9/18 at bedside.  Weaned off vasopressors.  PCCM will sign off. Please call if we can be of further assistance.     Canary Brim, NP-C [facility] Pgr: [phone] or if no answer [phone] 09/13/2016, 12:16 PM

## 2016-09-13 NOTE — Progress Notes (Signed)
PT Cancellation Note  Patient Details Name: Malvin JohnsJessie S Tretter MRN: [id number] DOB: 1920/04/07   Cancelled Treatment:    Reason Eval/Treat Not Completed: Patient not medically ready (having testing, HGB low. will check back tomorrow)   Rada HayHill, Deanda Ruddell Elizabeth 09/13/2016, 2:11 PM Blanchard KelchKaren Ares Tegtmeyer PT [phone]

## 2016-09-13 NOTE — Progress Notes (Signed)
[facility] Progress Note  Subjective:  S/p angio with coil embolization on 9/17.  One BM since then that was actually described as brown by nursing staff.  No complaints of abdominal pain.  Feels tired.  Complaining of shoulder pain.  Objective:  Vital signs in last 24 hours: Temp:  [94.3 F (34.6 C)-99.9 F (37.7 C)] 99.7 F (37.6 C) (09/18 0810) Pulse Rate:  [62-118] 83 (09/18 0845) Resp:  [17-39] 18 (09/18 0845) BP: (69-152)/(35-69) 136/52 (09/18 0845) SpO2:  [97 %-100 %] 100 % (09/18 0845) Last BM Date: 09/12/16 General:  Alert, Well-developed, in NAD Heart:  Regular rate and rhythm Pulm:  CTAB.   Abdomen:  Soft, non-distended.  BS present.  Minimal RLQ TTP. Extremities:  Without edema. Neurologic:  Alert and oriented x 4;  grossly normal neurologically. She has full rnage of motion of right shoulder and there is no arm swelling (right IJ TLC in place, site without apparent infection)  Intake/Output from previous day: 09/17 0701 - 09/18 0700 In: 4768.3 [I.V.:3703.3; Blood:1065] Out: 500 [Urine:500] Intake/Output this shift: Total I/O In: 337.6 [I.V.:337.6] Out: -   Lab Results:  Recent Labs  09/12/16 0137  09/12/16 1435 09/12/16 1610 09/13/16 0320  WBC 8.3  --   --  22.8*  --   HGB 9.0*  < > 8.7* 8.5* 8.3*  HCT 27.7*  < > 25.3* 24.7* 23.3*  PLT 157  --   --  49*  --   < > = values in this interval not displayed. BMET  Recent Labs  09/12/16 0137 09/12/16 0200 09/12/16 1610  NA 138 138 144  K 4.3 4.3 4.1  CL 112* 111 127*  CO2 20*  --  10*  GLUCOSE 197* 191* 182*  BUN 34* 35* 26*  CREATININE 1.24* 1.30* 1.17*  CALCIUM 9.0  --  6.5*   LFT  Recent Labs  09/12/16 1610  PROT 3.5*  ALBUMIN 2.0*  AST 22  ALT 12*  ALKPHOS 42  BILITOT 0.5   PT/INR  Recent Labs  09/12/16 1610  LABPROT 20.3*  INR 1.72   Nm Gi Blood Loss  Result Date: 09/12/2016 CLINICAL DATA:  Gastrointestinal bleeding. EXAM: NUCLEAR MEDICINE  GASTROINTESTINAL BLEEDING SCAN TECHNIQUE: Sequential abdominal images were obtained following intravenous administration of Tc-6m labeled red blood cells. RADIOPHARMACEUTICALS:  20.1 mCi Tc-73m in-vitro labeled red cells. COMPARISON:  None. FINDINGS: Immediately upon imaging, tagged red blood cells localized to the RIGHT lower quadrant. More delayed imaging demonstrates tagged red blood cells advancing to the transverse colon and LEFT colon. IMPRESSION: Active gastrointestinal bleeding initiating in the cecum / ascending colon. Findings conveyed to Archer Asa, MD on 09/12/2016  at09:39. Electronically Signed   By: Genevive Bi M.D.   On: 09/12/2016 09:40   Ir Angiogram Visceral Selective  Result Date: 09/12/2016 INDICATION: 82 year old female with large volume active lower GI bleed requiring transfusion. Nuclear medicine tagged red blood cell study localizes the bleed to the region of the cecum/ascending colon. Additionally, she has hypotension not responding to fluid resuscitation. She requires more blood products. We will place an emergent central line, administer more blood and perform visceral angiography with embolization if the bleeding source can be identified. EXAM: SELECTIVE VISCERAL ARTERIOGRAPHY; IR RIGHT FLOURO GUIDE CV LINE; IR ULTRASOUND GUIDANCE VASC ACCESS RIGHT; ADDITIONAL ARTERIOGRAPHY; IR EMBO ART VEN HEMORR LYMPH EXTRAV INC GUIDE ROADMAPPING 1. Ultrasound-guided access right internal jugular vein 2. Placement of a right IJ central venous catheter 3. Ultrasound-guided access right common  femoral artery 4. Catheterization superior mesenteric artery with arteriogram 5. Catheterization middle colic artery with arteriogram 6. Catheterization right branch of the middle colic artery with arteriogram 7. Catheterization marginal artery with arteriogram 8. Catheterization of an un-named ascending colonic branch artery with arteriogram 9. Coil embolization of unnamed ascending colonic branch artery  10. Catheterization of the right colic artery with arteriogram Interventional Radiologist:  Sterling Big, MD MEDICATIONS: 1 unit packed red blood cells administered during the course of the procedure. ANESTHESIA/SEDATION: Fentanyl 0 mcg IV; Versed 3 mg IV Moderate Sedation Time:  59 minutes The patient was continuously monitored during the procedure by the interventional radiology nurse under my direct supervision. CONTRAST:  25mL ISOVUE-300 IOPAMIDOL (ISOVUE-300) INJECTION 61%, 65mL ISOVUE-300 IOPAMIDOL (ISOVUE-300) INJECTION 61% FLUOROSCOPY TIME:  Fluoroscopy Time: 16 minutes 30 seconds (777 mGy). COMPLICATIONS: None immediate. PROCEDURE: Informed consent was obtained from the patient following explanation of the procedure, risks, benefits and alternatives. The patient understands, agrees and consents for the procedure. All questions were addressed. A time out was performed. Maximal barrier sterile technique utilized including caps, mask, sterile gowns, sterile gloves, large sterile drape, hand hygiene, and chlorhexidine skin prep. Attention was first turned to the right neck to establish central venous access. The right internal jugular vein was interrogated with ultrasound and found to be widely patent. An image was obtained and stored for the medical record. Local anesthesia was attained by infiltration with 1% lidocaine. A small dermatotomy was made. Under real-time sonographic guidance, the vessel was punctured with an 18 gauge needle. Using standard technique, the needle was exchanged over a 0.013 inch wire and the skin tract was dilated. A then, an Arrow triple-lumen catheter was advanced over the wire and position with the tip at the cavoatrial junction. The catheter flushed and aspirated with ease. It was flushed, capped and secured to the skin with Prolene suture. A sterile bandage was applied. Attention was then turned to the right groin. The right common femoral artery was interrogated with  ultrasound and found to be widely patent. An image was obtained and stored for the medical record. Local anesthesia was attained by infiltration with 1% lidocaine. A small dermatotomy was made. Under real-time sonographic guidance, the vessel was punctured with a 21 gauge micropuncture needle. Using standard technique, the initial micro needle was exchanged over a 0.018 micro wire for a transitional 4 [place] micro sheath. The micro sheath was then exchanged over a 0.035 wire for a working 5 [place] vascular sheath. A C2 cobra catheter was then advanced over the Bentson wire into the abdominal aorta. The catheter was used to select the superior mesenteric artery. A superior mesenteric arteriogram was performed. There is obvious active extravasation occurring in the proximal to mid ascending colon from a visible branch artery arising from the marginal artery. A renegade STC micro catheter was then advanced over a Fathom wire and used to select the middle colic artery. An arteriogram was performed confirming the vascular anatomy. The micro catheter was then navigated into the right branch of the middle colic artery. Arteriogram was again performed confirming that this vessel is ultimately contiguous with the site of active bleeding. The micro catheter was then advanced into the marginal artery and arteriogram was performed. Finally, the micro catheter was advanced into the unnamed ascending colonic branch artery. Contrast injection confirms rapid active bleeding into the ascending colon. Coil embolization was performed using a combination of 2 x 40 mm fibered and soft interlock detachable coils. Post embolization arteriogram demonstrates  successful embolization. There is trace revascularization of the distal most aspect of the bleeding artery secondary to collateral per fusion but this is minimal and only visible during high-pressure injection. To assess for other treatable sources of collateral supply, the micro  catheter was brought back into the superior mesenteric artery and advanced into the right colic artery. Arteriography was performed. No evidence of active extravasation from any of the distal branches of the right colic artery. The catheters were removed. A limited right common femoral arteriogram was performed confirming common femoral arterial access. Hemostasis was attained with the assistance of a Cordis Exoseal extra arterial vascular plug. IMPRESSION: 1. Successful placement of a right IJ approach triple-lumen central venous catheter. The catheter tip is at the cavoatrial junction and ready for use. 2. Angiography demonstrates active brisk bleeding from a branch of the marginal artery in the proximal to mid ascending colon. 3. Successful coil embolization of the actively bleeding artery. Signed, Sterling Big, MD [facility] Radiology Electronically Signed   By: Malachy Moan M.D.   On: 09/12/2016 12:28   Ir Angiogram Selective Each Additional Vessel  Result Date: 09/12/2016 INDICATION: 82 year old female with large volume active lower GI bleed requiring transfusion. Nuclear medicine tagged red blood cell study localizes the bleed to the region of the cecum/ascending colon. Additionally, she has hypotension not responding to fluid resuscitation. She requires more blood products. We will place an emergent central line, administer more blood and perform visceral angiography with embolization if the bleeding source can be identified. EXAM: SELECTIVE VISCERAL ARTERIOGRAPHY; IR RIGHT FLOURO GUIDE CV LINE; IR ULTRASOUND GUIDANCE VASC ACCESS RIGHT; ADDITIONAL ARTERIOGRAPHY; IR EMBO ART VEN HEMORR LYMPH EXTRAV INC GUIDE ROADMAPPING 1. Ultrasound-guided access right internal jugular vein 2. Placement of a right IJ central venous catheter 3. Ultrasound-guided access right common femoral artery 4. Catheterization superior mesenteric artery with arteriogram 5.  Catheterization middle colic artery with arteriogram 6. Catheterization right branch of the middle colic artery with arteriogram 7. Catheterization marginal artery with arteriogram 8. Catheterization of an un-named ascending colonic branch artery with arteriogram 9. Coil embolization of unnamed ascending colonic branch artery 10. Catheterization of the right colic artery with arteriogram Interventional Radiologist:  Sterling Big, MD MEDICATIONS: 1 unit packed red blood cells administered during the course of the procedure. ANESTHESIA/SEDATION: Fentanyl 0 mcg IV; Versed 3 mg IV Moderate Sedation Time:  59 minutes The patient was continuously monitored during the procedure by the interventional radiology nurse under my direct supervision. CONTRAST:  25mL ISOVUE-300 IOPAMIDOL (ISOVUE-300) INJECTION 61%, 65mL ISOVUE-300 IOPAMIDOL (ISOVUE-300) INJECTION 61% FLUOROSCOPY TIME:  Fluoroscopy Time: 16 minutes 30 seconds (777 mGy). COMPLICATIONS: None immediate. PROCEDURE: Informed consent was obtained from the patient following explanation of the procedure, risks, benefits and alternatives. The patient understands, agrees and consents for the procedure. All questions were addressed. A time out was performed. Maximal barrier sterile technique utilized including caps, mask, sterile gowns, sterile gloves, large sterile drape, hand hygiene, and chlorhexidine skin prep. Attention was first turned to the right neck to establish central venous access. The right internal jugular vein was interrogated with ultrasound and found to be widely patent. An image was obtained and stored for the medical record. Local anesthesia was attained by infiltration with 1% lidocaine. A small dermatotomy was made. Under real-time sonographic guidance, the vessel was punctured with an 18 gauge needle. Using standard technique, the needle was exchanged over a 0.013 inch wire and the skin tract was dilated. A then,  an Arrow triple-lumen catheter  was advanced over the wire and position with the tip at the cavoatrial junction. The catheter flushed and aspirated with ease. It was flushed, capped and secured to the skin with Prolene suture. A sterile bandage was applied. Attention was then turned to the right groin. The right common femoral artery was interrogated with ultrasound and found to be widely patent. An image was obtained and stored for the medical record. Local anesthesia was attained by infiltration with 1% lidocaine. A small dermatotomy was made. Under real-time sonographic guidance, the vessel was punctured with a 21 gauge micropuncture needle. Using standard technique, the initial micro needle was exchanged over a 0.018 micro wire for a transitional 4 [place] micro sheath. The micro sheath was then exchanged over a 0.035 wire for a working 5 [place] vascular sheath. A C2 cobra catheter was then advanced over the Bentson wire into the abdominal aorta. The catheter was used to select the superior mesenteric artery. A superior mesenteric arteriogram was performed. There is obvious active extravasation occurring in the proximal to mid ascending colon from a visible branch artery arising from the marginal artery. A renegade STC micro catheter was then advanced over a Fathom wire and used to select the middle colic artery. An arteriogram was performed confirming the vascular anatomy. The micro catheter was then navigated into the right branch of the middle colic artery. Arteriogram was again performed confirming that this vessel is ultimately contiguous with the site of active bleeding. The micro catheter was then advanced into the marginal artery and arteriogram was performed. Finally, the micro catheter was advanced into the unnamed ascending colonic branch artery. Contrast injection confirms rapid active bleeding into the ascending colon. Coil embolization was performed using a combination of 2 x 40 mm fibered and soft interlock detachable coils. Post  embolization arteriogram demonstrates successful embolization. There is trace revascularization of the distal most aspect of the bleeding artery secondary to collateral per fusion but this is minimal and only visible during high-pressure injection. To assess for other treatable sources of collateral supply, the micro catheter was brought back into the superior mesenteric artery and advanced into the right colic artery. Arteriography was performed. No evidence of active extravasation from any of the distal branches of the right colic artery. The catheters were removed. A limited right common femoral arteriogram was performed confirming common femoral arterial access. Hemostasis was attained with the assistance of a Cordis Exoseal extra arterial vascular plug. IMPRESSION: 1. Successful placement of a right IJ approach triple-lumen central venous catheter. The catheter tip is at the cavoatrial junction and ready for use. 2. Angiography demonstrates active brisk bleeding from a branch of the marginal artery in the proximal to mid ascending colon. 3. Successful coil embolization of the actively bleeding artery. Signed, Sterling Big, MD [facility] Radiology Electronically Signed   By: Malachy Moan M.D.   On: 09/12/2016 12:28   Ir Angiogram Selective Each Additional Vessel  Result Date: 09/12/2016 INDICATION: 82 year old female with large volume active lower GI bleed requiring transfusion. Nuclear medicine tagged red blood cell study localizes the bleed to the region of the cecum/ascending colon. Additionally, she has hypotension not responding to fluid resuscitation. She requires more blood products. We will place an emergent central line, administer more blood and perform visceral angiography with embolization if the bleeding source can be identified. EXAM: SELECTIVE VISCERAL ARTERIOGRAPHY; IR RIGHT FLOURO GUIDE CV LINE; IR ULTRASOUND GUIDANCE VASC ACCESS  RIGHT; ADDITIONAL ARTERIOGRAPHY;  IR EMBO ART VEN HEMORR LYMPH EXTRAV INC GUIDE ROADMAPPING 1. Ultrasound-guided access right internal jugular vein 2. Placement of a right IJ central venous catheter 3. Ultrasound-guided access right common femoral artery 4. Catheterization superior mesenteric artery with arteriogram 5. Catheterization middle colic artery with arteriogram 6. Catheterization right branch of the middle colic artery with arteriogram 7. Catheterization marginal artery with arteriogram 8. Catheterization of an un-named ascending colonic branch artery with arteriogram 9. Coil embolization of unnamed ascending colonic branch artery 10. Catheterization of the right colic artery with arteriogram Interventional Radiologist:  Sterling Big, MD MEDICATIONS: 1 unit packed red blood cells administered during the course of the procedure. ANESTHESIA/SEDATION: Fentanyl 0 mcg IV; Versed 3 mg IV Moderate Sedation Time:  59 minutes The patient was continuously monitored during the procedure by the interventional radiology nurse under my direct supervision. CONTRAST:  25mL ISOVUE-300 IOPAMIDOL (ISOVUE-300) INJECTION 61%, 65mL ISOVUE-300 IOPAMIDOL (ISOVUE-300) INJECTION 61% FLUOROSCOPY TIME:  Fluoroscopy Time: 16 minutes 30 seconds (777 mGy). COMPLICATIONS: None immediate. PROCEDURE: Informed consent was obtained from the patient following explanation of the procedure, risks, benefits and alternatives. The patient understands, agrees and consents for the procedure. All questions were addressed. A time out was performed. Maximal barrier sterile technique utilized including caps, mask, sterile gowns, sterile gloves, large sterile drape, hand hygiene, and chlorhexidine skin prep. Attention was first turned to the right neck to establish central venous access. The right internal jugular vein was interrogated with ultrasound and found to be widely patent. An image was obtained and stored for the medical record. Local  anesthesia was attained by infiltration with 1% lidocaine. A small dermatotomy was made. Under real-time sonographic guidance, the vessel was punctured with an 18 gauge needle. Using standard technique, the needle was exchanged over a 0.013 inch wire and the skin tract was dilated. A then, an Arrow triple-lumen catheter was advanced over the wire and position with the tip at the cavoatrial junction. The catheter flushed and aspirated with ease. It was flushed, capped and secured to the skin with Prolene suture. A sterile bandage was applied. Attention was then turned to the right groin. The right common femoral artery was interrogated with ultrasound and found to be widely patent. An image was obtained and stored for the medical record. Local anesthesia was attained by infiltration with 1% lidocaine. A small dermatotomy was made. Under real-time sonographic guidance, the vessel was punctured with a 21 gauge micropuncture needle. Using standard technique, the initial micro needle was exchanged over a 0.018 micro wire for a transitional 4 [place] micro sheath. The micro sheath was then exchanged over a 0.035 wire for a working 5 [place] vascular sheath. A C2 cobra catheter was then advanced over the Bentson wire into the abdominal aorta. The catheter was used to select the superior mesenteric artery. A superior mesenteric arteriogram was performed. There is obvious active extravasation occurring in the proximal to mid ascending colon from a visible branch artery arising from the marginal artery. A renegade STC micro catheter was then advanced over a Fathom wire and used to select the middle colic artery. An arteriogram was performed confirming the vascular anatomy. The micro catheter was then navigated into the right branch of the middle colic artery. Arteriogram was again performed confirming that this vessel is ultimately contiguous with the site of active bleeding. The micro catheter was then advanced into the  marginal artery and arteriogram was performed. Finally, the micro catheter was advanced into the unnamed ascending colonic branch artery. Contrast  injection confirms rapid active bleeding into the ascending colon. Coil embolization was performed using a combination of 2 x 40 mm fibered and soft interlock detachable coils. Post embolization arteriogram demonstrates successful embolization. There is trace revascularization of the distal most aspect of the bleeding artery secondary to collateral per fusion but this is minimal and only visible during high-pressure injection. To assess for other treatable sources of collateral supply, the micro catheter was brought back into the superior mesenteric artery and advanced into the right colic artery. Arteriography was performed. No evidence of active extravasation from any of the distal branches of the right colic artery. The catheters were removed. A limited right common femoral arteriogram was performed confirming common femoral arterial access. Hemostasis was attained with the assistance of a Cordis Exoseal extra arterial vascular plug. IMPRESSION: 1. Successful placement of a right IJ approach triple-lumen central venous catheter. The catheter tip is at the cavoatrial junction and ready for use. 2. Angiography demonstrates active brisk bleeding from a branch of the marginal artery in the proximal to mid ascending colon. 3. Successful coil embolization of the actively bleeding artery. Signed, Sterling Big, MD [facility] Radiology Electronically Signed   By: Malachy Moan M.D.   On: 09/12/2016 12:28   Ir Angiogram Selective Each Additional Vessel  Result Date: 09/12/2016 INDICATION: 82 year old female with large volume active lower GI bleed requiring transfusion. Nuclear medicine tagged red blood cell study localizes the bleed to the region of the cecum/ascending colon. Additionally, she has hypotension not  responding to fluid resuscitation. She requires more blood products. We will place an emergent central line, administer more blood and perform visceral angiography with embolization if the bleeding source can be identified. EXAM: SELECTIVE VISCERAL ARTERIOGRAPHY; IR RIGHT FLOURO GUIDE CV LINE; IR ULTRASOUND GUIDANCE VASC ACCESS RIGHT; ADDITIONAL ARTERIOGRAPHY; IR EMBO ART VEN HEMORR LYMPH EXTRAV INC GUIDE ROADMAPPING 1. Ultrasound-guided access right internal jugular vein 2. Placement of a right IJ central venous catheter 3. Ultrasound-guided access right common femoral artery 4. Catheterization superior mesenteric artery with arteriogram 5. Catheterization middle colic artery with arteriogram 6. Catheterization right branch of the middle colic artery with arteriogram 7. Catheterization marginal artery with arteriogram 8. Catheterization of an un-named ascending colonic branch artery with arteriogram 9. Coil embolization of unnamed ascending colonic branch artery 10. Catheterization of the right colic artery with arteriogram Interventional Radiologist:  Sterling Big, MD MEDICATIONS: 1 unit packed red blood cells administered during the course of the procedure. ANESTHESIA/SEDATION: Fentanyl 0 mcg IV; Versed 3 mg IV Moderate Sedation Time:  59 minutes The patient was continuously monitored during the procedure by the interventional radiology nurse under my direct supervision. CONTRAST:  25mL ISOVUE-300 IOPAMIDOL (ISOVUE-300) INJECTION 61%, 65mL ISOVUE-300 IOPAMIDOL (ISOVUE-300) INJECTION 61% FLUOROSCOPY TIME:  Fluoroscopy Time: 16 minutes 30 seconds (777 mGy). COMPLICATIONS: None immediate. PROCEDURE: Informed consent was obtained from the patient following explanation of the procedure, risks, benefits and alternatives. The patient understands, agrees and consents for the procedure. All questions were addressed. A time out was performed. Maximal barrier sterile technique utilized including caps, mask, sterile  gowns, sterile gloves, large sterile drape, hand hygiene, and chlorhexidine skin prep. Attention was first turned to the right neck to establish central venous access. The right internal jugular vein was interrogated with ultrasound and found to be widely patent. An image was obtained and stored for the medical record. Local anesthesia was attained by infiltration with 1% lidocaine. A small dermatotomy was made. Under real-time  sonographic guidance, the vessel was punctured with an 18 gauge needle. Using standard technique, the needle was exchanged over a 0.013 inch wire and the skin tract was dilated. A then, an Arrow triple-lumen catheter was advanced over the wire and position with the tip at the cavoatrial junction. The catheter flushed and aspirated with ease. It was flushed, capped and secured to the skin with Prolene suture. A sterile bandage was applied. Attention was then turned to the right groin. The right common femoral artery was interrogated with ultrasound and found to be widely patent. An image was obtained and stored for the medical record. Local anesthesia was attained by infiltration with 1% lidocaine. A small dermatotomy was made. Under real-time sonographic guidance, the vessel was punctured with a 21 gauge micropuncture needle. Using standard technique, the initial micro needle was exchanged over a 0.018 micro wire for a transitional 4 [place] micro sheath. The micro sheath was then exchanged over a 0.035 wire for a working 5 [place] vascular sheath. A C2 cobra catheter was then advanced over the Bentson wire into the abdominal aorta. The catheter was used to select the superior mesenteric artery. A superior mesenteric arteriogram was performed. There is obvious active extravasation occurring in the proximal to mid ascending colon from a visible branch artery arising from the marginal artery. A renegade STC micro catheter was then advanced over a Fathom wire and used to select the middle colic  artery. An arteriogram was performed confirming the vascular anatomy. The micro catheter was then navigated into the right branch of the middle colic artery. Arteriogram was again performed confirming that this vessel is ultimately contiguous with the site of active bleeding. The micro catheter was then advanced into the marginal artery and arteriogram was performed. Finally, the micro catheter was advanced into the unnamed ascending colonic branch artery. Contrast injection confirms rapid active bleeding into the ascending colon. Coil embolization was performed using a combination of 2 x 40 mm fibered and soft interlock detachable coils. Post embolization arteriogram demonstrates successful embolization. There is trace revascularization of the distal most aspect of the bleeding artery secondary to collateral per fusion but this is minimal and only visible during high-pressure injection. To assess for other treatable sources of collateral supply, the micro catheter was brought back into the superior mesenteric artery and advanced into the right colic artery. Arteriography was performed. No evidence of active extravasation from any of the distal branches of the right colic artery. The catheters were removed. A limited right common femoral arteriogram was performed confirming common femoral arterial access. Hemostasis was attained with the assistance of a Cordis Exoseal extra arterial vascular plug. IMPRESSION: 1. Successful placement of a right IJ approach triple-lumen central venous catheter. The catheter tip is at the cavoatrial junction and ready for use. 2. Angiography demonstrates active brisk bleeding from a branch of the marginal artery in the proximal to mid ascending colon. 3. Successful coil embolization of the actively bleeding artery. Signed, Sterling Big, MD [facility] Radiology Electronically Signed   By: Malachy Moan M.D.   On: 09/12/2016  12:28   Ir Angiogram Selective Each Additional Vessel  Result Date: 09/12/2016 INDICATION: 82 year old female with large volume active lower GI bleed requiring transfusion. Nuclear medicine tagged red blood cell study localizes the bleed to the region of the cecum/ascending colon. Additionally, she has hypotension not responding to fluid resuscitation. She requires more blood products. We will place an emergent central line, administer more blood and  perform visceral angiography with embolization if the bleeding source can be identified. EXAM: SELECTIVE VISCERAL ARTERIOGRAPHY; IR RIGHT FLOURO GUIDE CV LINE; IR ULTRASOUND GUIDANCE VASC ACCESS RIGHT; ADDITIONAL ARTERIOGRAPHY; IR EMBO ART VEN HEMORR LYMPH EXTRAV INC GUIDE ROADMAPPING 1. Ultrasound-guided access right internal jugular vein 2. Placement of a right IJ central venous catheter 3. Ultrasound-guided access right common femoral artery 4. Catheterization superior mesenteric artery with arteriogram 5. Catheterization middle colic artery with arteriogram 6. Catheterization right branch of the middle colic artery with arteriogram 7. Catheterization marginal artery with arteriogram 8. Catheterization of an un-named ascending colonic branch artery with arteriogram 9. Coil embolization of unnamed ascending colonic branch artery 10. Catheterization of the right colic artery with arteriogram Interventional Radiologist:  Sterling Big, MD MEDICATIONS: 1 unit packed red blood cells administered during the course of the procedure. ANESTHESIA/SEDATION: Fentanyl 0 mcg IV; Versed 3 mg IV Moderate Sedation Time:  59 minutes The patient was continuously monitored during the procedure by the interventional radiology nurse under my direct supervision. CONTRAST:  25mL ISOVUE-300 IOPAMIDOL (ISOVUE-300) INJECTION 61%, 65mL ISOVUE-300 IOPAMIDOL (ISOVUE-300) INJECTION 61% FLUOROSCOPY TIME:  Fluoroscopy Time: 16 minutes 30 seconds (777 mGy). COMPLICATIONS: None immediate.  PROCEDURE: Informed consent was obtained from the patient following explanation of the procedure, risks, benefits and alternatives. The patient understands, agrees and consents for the procedure. All questions were addressed. A time out was performed. Maximal barrier sterile technique utilized including caps, mask, sterile gowns, sterile gloves, large sterile drape, hand hygiene, and chlorhexidine skin prep. Attention was first turned to the right neck to establish central venous access. The right internal jugular vein was interrogated with ultrasound and found to be widely patent. An image was obtained and stored for the medical record. Local anesthesia was attained by infiltration with 1% lidocaine. A small dermatotomy was made. Under real-time sonographic guidance, the vessel was punctured with an 18 gauge needle. Using standard technique, the needle was exchanged over a 0.013 inch wire and the skin tract was dilated. A then, an Arrow triple-lumen catheter was advanced over the wire and position with the tip at the cavoatrial junction. The catheter flushed and aspirated with ease. It was flushed, capped and secured to the skin with Prolene suture. A sterile bandage was applied. Attention was then turned to the right groin. The right common femoral artery was interrogated with ultrasound and found to be widely patent. An image was obtained and stored for the medical record. Local anesthesia was attained by infiltration with 1% lidocaine. A small dermatotomy was made. Under real-time sonographic guidance, the vessel was punctured with a 21 gauge micropuncture needle. Using standard technique, the initial micro needle was exchanged over a 0.018 micro wire for a transitional 4 [place] micro sheath. The micro sheath was then exchanged over a 0.035 wire for a working 5 [place] vascular sheath. A C2 cobra catheter was then advanced over the Bentson wire into the abdominal aorta. The catheter was used to select the  superior mesenteric artery. A superior mesenteric arteriogram was performed. There is obvious active extravasation occurring in the proximal to mid ascending colon from a visible branch artery arising from the marginal artery. A renegade STC micro catheter was then advanced over a Fathom wire and used to select the middle colic artery. An arteriogram was performed confirming the vascular anatomy. The micro catheter was then navigated into the right branch of the middle colic artery. Arteriogram was again performed confirming that this vessel is ultimately contiguous with the site of  active bleeding. The micro catheter was then advanced into the marginal artery and arteriogram was performed. Finally, the micro catheter was advanced into the unnamed ascending colonic branch artery. Contrast injection confirms rapid active bleeding into the ascending colon. Coil embolization was performed using a combination of 2 x 40 mm fibered and soft interlock detachable coils. Post embolization arteriogram demonstrates successful embolization. There is trace revascularization of the distal most aspect of the bleeding artery secondary to collateral per fusion but this is minimal and only visible during high-pressure injection. To assess for other treatable sources of collateral supply, the micro catheter was brought back into the superior mesenteric artery and advanced into the right colic artery. Arteriography was performed. No evidence of active extravasation from any of the distal branches of the right colic artery. The catheters were removed. A limited right common femoral arteriogram was performed confirming common femoral arterial access. Hemostasis was attained with the assistance of a Cordis Exoseal extra arterial vascular plug. IMPRESSION: 1. Successful placement of a right IJ approach triple-lumen central venous catheter. The catheter tip is at the cavoatrial junction and ready for use. 2. Angiography demonstrates active  brisk bleeding from a branch of the marginal artery in the proximal to mid ascending colon. 3. Successful coil embolization of the actively bleeding artery. Signed, Sterling Big, MD [facility] Radiology Electronically Signed   By: Malachy Moan M.D.   On: 09/12/2016 12:28   Ir Fluoro Guide Cv Line Right  Result Date: 09/12/2016 INDICATION: 82 year old female with large volume active lower GI bleed requiring transfusion. Nuclear medicine tagged red blood cell study localizes the bleed to the region of the cecum/ascending colon. Additionally, she has hypotension not responding to fluid resuscitation. She requires more blood products. We will place an emergent central line, administer more blood and perform visceral angiography with embolization if the bleeding source can be identified. EXAM: SELECTIVE VISCERAL ARTERIOGRAPHY; IR RIGHT FLOURO GUIDE CV LINE; IR ULTRASOUND GUIDANCE VASC ACCESS RIGHT; ADDITIONAL ARTERIOGRAPHY; IR EMBO ART VEN HEMORR LYMPH EXTRAV INC GUIDE ROADMAPPING 1. Ultrasound-guided access right internal jugular vein 2. Placement of a right IJ central venous catheter 3. Ultrasound-guided access right common femoral artery 4. Catheterization superior mesenteric artery with arteriogram 5. Catheterization middle colic artery with arteriogram 6. Catheterization right branch of the middle colic artery with arteriogram 7. Catheterization marginal artery with arteriogram 8. Catheterization of an un-named ascending colonic branch artery with arteriogram 9. Coil embolization of unnamed ascending colonic branch artery 10. Catheterization of the right colic artery with arteriogram Interventional Radiologist:  Sterling Big, MD MEDICATIONS: 1 unit packed red blood cells administered during the course of the procedure. ANESTHESIA/SEDATION: Fentanyl 0 mcg IV; Versed 3 mg IV Moderate Sedation Time:  59 minutes The patient was continuously  monitored during the procedure by the interventional radiology nurse under my direct supervision. CONTRAST:  25mL ISOVUE-300 IOPAMIDOL (ISOVUE-300) INJECTION 61%, 65mL ISOVUE-300 IOPAMIDOL (ISOVUE-300) INJECTION 61% FLUOROSCOPY TIME:  Fluoroscopy Time: 16 minutes 30 seconds (777 mGy). COMPLICATIONS: None immediate. PROCEDURE: Informed consent was obtained from the patient following explanation of the procedure, risks, benefits and alternatives. The patient understands, agrees and consents for the procedure. All questions were addressed. A time out was performed. Maximal barrier sterile technique utilized including caps, mask, sterile gowns, sterile gloves, large sterile drape, hand hygiene, and chlorhexidine skin prep. Attention was first turned to the right neck to establish central venous access. The right internal jugular vein was interrogated with ultrasound and found  to be widely patent. An image was obtained and stored for the medical record. Local anesthesia was attained by infiltration with 1% lidocaine. A small dermatotomy was made. Under real-time sonographic guidance, the vessel was punctured with an 18 gauge needle. Using standard technique, the needle was exchanged over a 0.013 inch wire and the skin tract was dilated. A then, an Arrow triple-lumen catheter was advanced over the wire and position with the tip at the cavoatrial junction. The catheter flushed and aspirated with ease. It was flushed, capped and secured to the skin with Prolene suture. A sterile bandage was applied. Attention was then turned to the right groin. The right common femoral artery was interrogated with ultrasound and found to be widely patent. An image was obtained and stored for the medical record. Local anesthesia was attained by infiltration with 1% lidocaine. A small dermatotomy was made. Under real-time sonographic guidance, the vessel was punctured with a 21 gauge micropuncture needle. Using standard technique, the initial  micro needle was exchanged over a 0.018 micro wire for a transitional 4 [place] micro sheath. The micro sheath was then exchanged over a 0.035 wire for a working 5 [place] vascular sheath. A C2 cobra catheter was then advanced over the Bentson wire into the abdominal aorta. The catheter was used to select the superior mesenteric artery. A superior mesenteric arteriogram was performed. There is obvious active extravasation occurring in the proximal to mid ascending colon from a visible branch artery arising from the marginal artery. A renegade STC micro catheter was then advanced over a Fathom wire and used to select the middle colic artery. An arteriogram was performed confirming the vascular anatomy. The micro catheter was then navigated into the right branch of the middle colic artery. Arteriogram was again performed confirming that this vessel is ultimately contiguous with the site of active bleeding. The micro catheter was then advanced into the marginal artery and arteriogram was performed. Finally, the micro catheter was advanced into the unnamed ascending colonic branch artery. Contrast injection confirms rapid active bleeding into the ascending colon. Coil embolization was performed using a combination of 2 x 40 mm fibered and soft interlock detachable coils. Post embolization arteriogram demonstrates successful embolization. There is trace revascularization of the distal most aspect of the bleeding artery secondary to collateral per fusion but this is minimal and only visible during high-pressure injection. To assess for other treatable sources of collateral supply, the micro catheter was brought back into the superior mesenteric artery and advanced into the right colic artery. Arteriography was performed. No evidence of active extravasation from any of the distal branches of the right colic artery. The catheters were removed. A limited right common femoral arteriogram was performed confirming common femoral  arterial access. Hemostasis was attained with the assistance of a Cordis Exoseal extra arterial vascular plug. IMPRESSION: 1. Successful placement of a right IJ approach triple-lumen central venous catheter. The catheter tip is at the cavoatrial junction and ready for use. 2. Angiography demonstrates active brisk bleeding from a branch of the marginal artery in the proximal to mid ascending colon. 3. Successful coil embolization of the actively bleeding artery. Signed, Sterling BigHeath K. McCullough, MD [facility] Electron[facility] Signed   By: Malachy MoanHeath  McCullough M.D.   On: 09/12/2016 12:28   Ir [place] Guide Vasc Access Right  Result Date: 09/12/2016 INDICATION: 82 year old female with large volume active lower GI bleed requiring transfusion. Nuclear medicine tagged red blood cell study localizes the bleed to the  region of the cecum/ascending colon. Additionally, she has hypotension not responding to fluid resuscitation. She requires more blood products. We will place an emergent central line, administer more blood and perform visceral angiography with embolization if the bleeding source can be identified. EXAM: SELECTIVE VISCERAL ARTERIOGRAPHY; IR RIGHT FLOURO GUIDE CV LINE; IR ULTRASOUND GUIDANCE VASC ACCESS RIGHT; ADDITIONAL ARTERIOGRAPHY; IR EMBO ART VEN HEMORR LYMPH EXTRAV INC GUIDE ROADMAPPING 1. Ultrasound-guided access right internal jugular vein 2. Placement of a right IJ central venous catheter 3. Ultrasound-guided access right common femoral artery 4. Catheterization superior mesenteric artery with arteriogram 5. Catheterization middle colic artery with arteriogram 6. Catheterization right branch of the middle colic artery with arteriogram 7. Catheterization marginal artery with arteriogram 8. Catheterization of an un-named ascending colonic branch artery with arteriogram 9. Coil embolization of unnamed ascending colonic branch artery 10. Catheterization of the  right colic artery with arteriogram Interventional Radiologist:  Sterling Big, MD MEDICATIONS: 1 unit packed red blood cells administered during the course of the procedure. ANESTHESIA/SEDATION: Fentanyl 0 mcg IV; Versed 3 mg IV Moderate Sedation Time:  59 minutes The patient was continuously monitored during the procedure by the interventional radiology nurse under my direct supervision. CONTRAST:  25mL ISOVUE-300 IOPAMIDOL (ISOVUE-300) INJECTION 61%, 65mL ISOVUE-300 IOPAMIDOL (ISOVUE-300) INJECTION 61% FLUOROSCOPY TIME:  Fluoroscopy Time: 16 minutes 30 seconds (777 mGy). COMPLICATIONS: None immediate. PROCEDURE: Informed consent was obtained from the patient following explanation of the procedure, risks, benefits and alternatives. The patient understands, agrees and consents for the procedure. All questions were addressed. A time out was performed. Maximal barrier sterile technique utilized including caps, mask, sterile gowns, sterile gloves, large sterile drape, hand hygiene, and chlorhexidine skin prep. Attention was first turned to the right neck to establish central venous access. The right internal jugular vein was interrogated with ultrasound and found to be widely patent. An image was obtained and stored for the medical record. Local anesthesia was attained by infiltration with 1% lidocaine. A small dermatotomy was made. Under real-time sonographic guidance, the vessel was punctured with an 18 gauge needle. Using standard technique, the needle was exchanged over a 0.013 inch wire and the skin tract was dilated. A then, an Arrow triple-lumen catheter was advanced over the wire and position with the tip at the cavoatrial junction. The catheter flushed and aspirated with ease. It was flushed, capped and secured to the skin with Prolene suture. A sterile bandage was applied. Attention was then turned to the right groin. The right common femoral artery was interrogated with ultrasound and found to be  widely patent. An image was obtained and stored for the medical record. Local anesthesia was attained by infiltration with 1% lidocaine. A small dermatotomy was made. Under real-time sonographic guidance, the vessel was punctured with a 21 gauge micropuncture needle. Using standard technique, the initial micro needle was exchanged over a 0.018 micro wire for a transitional 4 [place] micro sheath. The micro sheath was then exchanged over a 0.035 wire for a working 5 [place] vascular sheath. A C2 cobra catheter was then advanced over the Bentson wire into the abdominal aorta. The catheter was used to select the superior mesenteric artery. A superior mesenteric arteriogram was performed. There is obvious active extravasation occurring in the proximal to mid ascending colon from a visible branch artery arising from the marginal artery. A renegade STC micro catheter was then advanced over a Fathom wire and used to select the middle colic artery. An arteriogram was performed confirming the vascular anatomy.  The micro catheter was then navigated into the right branch of the middle colic artery. Arteriogram was again performed confirming that this vessel is ultimately contiguous with the site of active bleeding. The micro catheter was then advanced into the marginal artery and arteriogram was performed. Finally, the micro catheter was advanced into the unnamed ascending colonic branch artery. Contrast injection confirms rapid active bleeding into the ascending colon. Coil embolization was performed using a combination of 2 x 40 mm fibered and soft interlock detachable coils. Post embolization arteriogram demonstrates successful embolization. There is trace revascularization of the distal most aspect of the bleeding artery secondary to collateral per fusion but this is minimal and only visible during high-pressure injection. To assess for other treatable sources of collateral supply, the micro catheter was brought back into  the superior mesenteric artery and advanced into the right colic artery. Arteriography was performed. No evidence of active extravasation from any of the distal branches of the right colic artery. The catheters were removed. A limited right common femoral arteriogram was performed confirming common femoral arterial access. Hemostasis was attained with the assistance of a Cordis Exoseal extra arterial vascular plug. IMPRESSION: 1. Successful placement of a right IJ approach triple-lumen central venous catheter. The catheter tip is at the cavoatrial junction and ready for use. 2. Angiography demonstrates active brisk bleeding from a branch of the marginal artery in the proximal to mid ascending colon. 3. Successful coil embolization of the actively bleeding artery. Signed, Sterling Big, MD [facility] Radiology Electronically Signed   By: Malachy Moan M.D.   On: 09/12/2016 12:28   Ir [place] Guide Vasc Access Right  Result Date: 09/12/2016 INDICATION: 82 year old female with large volume active lower GI bleed requiring transfusion. Nuclear medicine tagged red blood cell study localizes the bleed to the region of the cecum/ascending colon. Additionally, she has hypotension not responding to fluid resuscitation. She requires more blood products. We will place an emergent central line, administer more blood and perform visceral angiography with embolization if the bleeding source can be identified. EXAM: SELECTIVE VISCERAL ARTERIOGRAPHY; IR RIGHT FLOURO GUIDE CV LINE; IR ULTRASOUND GUIDANCE VASC ACCESS RIGHT; ADDITIONAL ARTERIOGRAPHY; IR EMBO ART VEN HEMORR LYMPH EXTRAV INC GUIDE ROADMAPPING 1. Ultrasound-guided access right internal jugular vein 2. Placement of a right IJ central venous catheter 3. Ultrasound-guided access right common femoral artery 4. Catheterization superior mesenteric artery with arteriogram 5. Catheterization middle colic artery with  arteriogram 6. Catheterization right branch of the middle colic artery with arteriogram 7. Catheterization marginal artery with arteriogram 8. Catheterization of an un-named ascending colonic branch artery with arteriogram 9. Coil embolization of unnamed ascending colonic branch artery 10. Catheterization of the right colic artery with arteriogram Interventional Radiologist:  Sterling Big, MD MEDICATIONS: 1 unit packed red blood cells administered during the course of the procedure. ANESTHESIA/SEDATION: Fentanyl 0 mcg IV; Versed 3 mg IV Moderate Sedation Time:  59 minutes The patient was continuously monitored during the procedure by the interventional radiology nurse under my direct supervision. CONTRAST:  25mL ISOVUE-300 IOPAMIDOL (ISOVUE-300) INJECTION 61%, 65mL ISOVUE-300 IOPAMIDOL (ISOVUE-300) INJECTION 61% FLUOROSCOPY TIME:  Fluoroscopy Time: 16 minutes 30 seconds (777 mGy). COMPLICATIONS: None immediate. PROCEDURE: Informed consent was obtained from the patient following explanation of the procedure, risks, benefits and alternatives. The patient understands, agrees and consents for the procedure. All questions were addressed. A time out was performed. Maximal barrier sterile technique utilized including caps, mask, sterile gowns, sterile gloves, large sterile drape,  hand hygiene, and chlorhexidine skin prep. Attention was first turned to the right neck to establish central venous access. The right internal jugular vein was interrogated with ultrasound and found to be widely patent. An image was obtained and stored for the medical record. Local anesthesia was attained by infiltration with 1% lidocaine. A small dermatotomy was made. Under real-time sonographic guidance, the vessel was punctured with an 18 gauge needle. Using standard technique, the needle was exchanged over a 0.013 inch wire and the skin tract was dilated. A then, an Arrow triple-lumen catheter was advanced over the wire and position  with the tip at the cavoatrial junction. The catheter flushed and aspirated with ease. It was flushed, capped and secured to the skin with Prolene suture. A sterile bandage was applied. Attention was then turned to the right groin. The right common femoral artery was interrogated with ultrasound and found to be widely patent. An image was obtained and stored for the medical record. Local anesthesia was attained by infiltration with 1% lidocaine. A small dermatotomy was made. Under real-time sonographic guidance, the vessel was punctured with a 21 gauge micropuncture needle. Using standard technique, the initial micro needle was exchanged over a 0.018 micro wire for a transitional 4 [place] micro sheath. The micro sheath was then exchanged over a 0.035 wire for a working 5 [place] vascular sheath. A C2 cobra catheter was then advanced over the Bentson wire into the abdominal aorta. The catheter was used to select the superior mesenteric artery. A superior mesenteric arteriogram was performed. There is obvious active extravasation occurring in the proximal to mid ascending colon from a visible branch artery arising from the marginal artery. A renegade STC micro catheter was then advanced over a Fathom wire and used to select the middle colic artery. An arteriogram was performed confirming the vascular anatomy. The micro catheter was then navigated into the right branch of the middle colic artery. Arteriogram was again performed confirming that this vessel is ultimately contiguous with the site of active bleeding. The micro catheter was then advanced into the marginal artery and arteriogram was performed. Finally, the micro catheter was advanced into the unnamed ascending colonic branch artery. Contrast injection confirms rapid active bleeding into the ascending colon. Coil embolization was performed using a combination of 2 x 40 mm fibered and soft interlock detachable coils. Post embolization arteriogram demonstrates  successful embolization. There is trace revascularization of the distal most aspect of the bleeding artery secondary to collateral per fusion but this is minimal and only visible during high-pressure injection. To assess for other treatable sources of collateral supply, the micro catheter was brought back into the superior mesenteric artery and advanced into the right colic artery. Arteriography was performed. No evidence of active extravasation from any of the distal branches of the right colic artery. The catheters were removed. A limited right common femoral arteriogram was performed confirming common femoral arterial access. Hemostasis was attained with the assistance of a Cordis Exoseal extra arterial vascular plug. IMPRESSION: 1. Successful placement of a right IJ approach triple-lumen central venous catheter. The catheter tip is at the cavoatrial junction and ready for use. 2. Angiography demonstrates active brisk bleeding from a branch of the marginal artery in the proximal to mid ascending colon. 3. Successful coil embolization of the actively bleeding artery. Signed, Sterling Big, MD [facility] Electronically Signed   By: Malachy Moan M.D.   On: 09/12/2016 12:28   Dg Chest Portable 1  View  Result Date: 09/12/2016 CLINICAL DATA:  Rectal bleeding since last night. Weakness. Shortness of breath. EXAM: PORTABLE CHEST 1 VIEW COMPARISON:  08/06/2015 FINDINGS: Unchanged mediastinal contours with thoracic aortic atherosclerosis. Chronic appearing increased interstitial opacities, suggesting emphysema. Calcified granuloma in the right midlung zone. No focal airspace disease, evidence of pulmonary edema, pleural effusion or pneumothorax. The bones are under mineralized. IMPRESSION: Chronic interstitial thickening.  No evidence of acute abnormality. Thoracic aortic atherosclerosis. Electronically Signed   By: Rubye Oaks M.D.   On:  09/12/2016 02:28   Ir Embo Art  [facility] Guide Roadmapping  Result Date: 09/12/2016 INDICATION: 82 year old female with large volume active lower GI bleed requiring transfusion. Nuclear medicine tagged red blood cell study localizes the bleed to the region of the cecum/ascending colon. Additionally, she has hypotension not responding to fluid resuscitation. She requires more blood products. We will place an emergent central line, administer more blood and perform visceral angiography with embolization if the bleeding source can be identified. EXAM: SELECTIVE VISCERAL ARTERIOGRAPHY; IR RIGHT FLOURO GUIDE CV LINE; IR ULTRASOUND GUIDANCE VASC ACCESS RIGHT; ADDITIONAL ARTERIOGRAPHY; IR EMBO ART VEN HEMORR LYMPH EXTRAV INC GUIDE ROADMAPPING 1. Ultrasound-guided access right internal jugular vein 2. Placement of a right IJ central venous catheter 3. Ultrasound-guided access right common femoral artery 4. Catheterization superior mesenteric artery with arteriogram 5. Catheterization middle colic artery with arteriogram 6. Catheterization right branch of the middle colic artery with arteriogram 7. Catheterization marginal artery with arteriogram 8. Catheterization of an un-named ascending colonic branch artery with arteriogram 9. Coil embolization of unnamed ascending colonic branch artery 10. Catheterization of the right colic artery with arteriogram Interventional Radiologist:  Sterling Big, MD MEDICATIONS: 1 unit packed red blood cells administered during the course of the procedure. ANESTHESIA/SEDATION: Fentanyl 0 mcg IV; Versed 3 mg IV Moderate Sedation Time:  59 minutes The patient was continuously monitored during the procedure by the interventional radiology nurse under my direct supervision. CONTRAST:  25mL ISOVUE-300 IOPAMIDOL (ISOVUE-300) INJECTION 61%, 65mL ISOVUE-300 IOPAMIDOL (ISOVUE-300) INJECTION 61% FLUOROSCOPY TIME:  Fluoroscopy Time: 16 minutes 30 seconds (777 mGy).  COMPLICATIONS: None immediate. PROCEDURE: Informed consent was obtained from the patient following explanation of the procedure, risks, benefits and alternatives. The patient understands, agrees and consents for the procedure. All questions were addressed. A time out was performed. Maximal barrier sterile technique utilized including caps, mask, sterile gowns, sterile gloves, large sterile drape, hand hygiene, and chlorhexidine skin prep. Attention was first turned to the right neck to establish central venous access. The right internal jugular vein was interrogated with ultrasound and found to be widely patent. An image was obtained and stored for the medical record. Local anesthesia was attained by infiltration with 1% lidocaine. A small dermatotomy was made. Under real-time sonographic guidance, the vessel was punctured with an 18 gauge needle. Using standard technique, the needle was exchanged over a 0.013 inch wire and the skin tract was dilated. A then, an Arrow triple-lumen catheter was advanced over the wire and position with the tip at the cavoatrial junction. The catheter flushed and aspirated with ease. It was flushed, capped and secured to the skin with Prolene suture. A sterile bandage was applied. Attention was then turned to the right groin. The right common femoral artery was interrogated with ultrasound and found to be widely patent. An image was obtained and stored for the medical record. Local anesthesia was attained by infiltration with 1% lidocaine. A small dermatotomy was made. Under  real-time sonographic guidance, the vessel was punctured with a 21 gauge micropuncture needle. Using standard technique, the initial micro needle was exchanged over a 0.018 micro wire for a transitional 4 [place] micro sheath. The micro sheath was then exchanged over a 0.035 wire for a working 5 [place] vascular sheath. A C2 cobra catheter was then advanced over the Bentson wire into the abdominal aorta. The catheter  was used to select the superior mesenteric artery. A superior mesenteric arteriogram was performed. There is obvious active extravasation occurring in the proximal to mid ascending colon from a visible branch artery arising from the marginal artery. A renegade STC micro catheter was then advanced over a Fathom wire and used to select the middle colic artery. An arteriogram was performed confirming the vascular anatomy. The micro catheter was then navigated into the right branch of the middle colic artery. Arteriogram was again performed confirming that this vessel is ultimately contiguous with the site of active bleeding. The micro catheter was then advanced into the marginal artery and arteriogram was performed. Finally, the micro catheter was advanced into the unnamed ascending colonic branch artery. Contrast injection confirms rapid active bleeding into the ascending colon. Coil embolization was performed using a combination of 2 x 40 mm fibered and soft interlock detachable coils. Post embolization arteriogram demonstrates successful embolization. There is trace revascularization of the distal most aspect of the bleeding artery secondary to collateral per fusion but this is minimal and only visible during high-pressure injection. To assess for other treatable sources of collateral supply, the micro catheter was brought back into the superior mesenteric artery and advanced into the right colic artery. Arteriography was performed. No evidence of active extravasation from any of the distal branches of the right colic artery. The catheters were removed. A limited right common femoral arteriogram was performed confirming common femoral arterial access. Hemostasis was attained with the assistance of a Cordis Exoseal extra arterial vascular plug. IMPRESSION: 1. Successful placement of a right IJ approach triple-lumen central venous catheter. The catheter tip is at the cavoatrial junction and ready for use. 2.  Angiography demonstrates active brisk bleeding from a branch of the marginal artery in the proximal to mid ascending colon. 3. Successful coil embolization of the actively bleeding artery. Signed, Sterling Big, MD [facility] Electronically Signed   By: Malachy Moan M.D.   On: 09/12/2016 12:28   Assessment / Plan: -82 year old female with LGIB with bleeding scan positive for cecum/ascending colon.  Angio performed by IR with coil embolization of actively bleeding artery in the proximal to mid-ascending colon.  Will check BMP today and again in the AM to monitor bicarb and renal function.  Need to monitor for signs of ischemia in the setting of embolization and renal function with large dye load and with use of pressors (as need to keep well hydrated). -Acute blood loss anemia:  Hgb holding stable.  S/p four units PRBC's.  Monitor and transfuse further prn. -Hypotension/hemorrhagic shock:  Still on pressors (levophed), which they are trying to wean.  On 125 cc/hour of fluids.   LOS: 1 day   ZEHR, JESSICA D.  09/13/2016, 8:57 AM  Pager number [phone]  I have discussed the case with the PA, and that is the plan I formulated. I personally interviewed and examined the patient.  CC: hematochezia    She looks well and bleeding has stopped after IR procedure.  Hgb stable, hopefully off pressors soon.  We  ordered labs today for medical team to monitor renal function (after dye load) and acid/base status due to volume loss, bleeding, risk of right colon localized ischemia/infarction.  Abdominal exam reassuring.  This appears most likely to have been a right colon diverticular bleed.  Keep her on a clear liquid diet today. If labs are encouraging and exam remains benign, she can be advanced to full liquid diet tomorrow, and then advanced as tolerated thereafter if there are no clinical signs of ischemic bowel compromise. We will sign  off, feel free to call us as the need arises.    Charlie Pitter III Pager [phone]  Mon-Fri 8a-5p [phone] after 5p, weekends, holidays

## 2016-09-13 NOTE — Care Management Note (Addendum)
Case Management Note  Patient Details  Name: Desiree Hutchinson MRN: [id number] Date of Birth: 1920-07-13  Subjective/Objective:        Anemia with hypotension            Action/Plan: snf   Expected Discharge Date:                  Expected Discharge Plan: lives at home In-House Referral:  Clinical Social Work  Discharge planning Services     Post Acute Care Choice:    Choice offered to:     DME Arranged:    DME Agency:     HH Arranged:    HH Agency:     Status of Service:     If discussed at [employer] Meetings, dates discussed:    Additional Comments:Date:  September 13, 2016 Chart reviewed for concurrent status and case management needs. Will continue to follow the patient for status change: Discharge Planning: following for needs Expected discharge date: [id number] Marcelle SmilingRhonda Marlo Goodrich, BSN, [place]3, [place]   [phone]  Golda Acreavis, Daeja Helderman Lynn, RN 09/13/2016, 9:54 AM

## 2016-09-13 NOTE — Progress Notes (Signed)
   09/13/16 1200  Clinical Encounter Type  Visited With Patient  Visit Type Initial;Psychological support;Spiritual support;Critical Care  Referral From Chaplain  Consult/Referral To Chaplain  Spiritual Encounters  Spiritual Needs Emotional;Other (Comment) (Pastoral Conversation/Support)  Stress Factors  Patient Stress Factors None identified   I visited with the patient per referral by the weekend Chaplain. The patient had a bright affect upon my arrival and was very appreciative of my visit. She stated that she is still very weak from losing blood and isn't able to eat. She stated that she is trying to get her strength back up. When asked what she needed today she stated that she was fine and just appreciated the visit.   Will follow up at a later time.    Chaplain Clint BolderBrittany Chandrika Sandles M.Div.

## 2016-09-13 NOTE — Evaluation (Addendum)
Physical Therapy Evaluation Patient Details Name: Desiree Hutchinson MRN: [id number] DOB: 03/05/1920 Today's Date: 09/13/2016   History of Present Illness  82 yo female admitted with LGIB, hemorrhagic shock.   Clinical Impression  Bed level eval only. Pt declined to attempt OOB activity on today despite encouragement from therapist. Pt reports she still feels too weak. Assisted pt on/off bedpan at her request. Explained importance of starting to get OOB and reason for PT eval to pt. Will continue to follow and progress activity as able. If pt returns home, then HHPT.     Follow Up Recommendations SNF;Supervision/Assistance - 24 hour (depending on progress and family's ability to provide current level of care)    Equipment Recommendations  None recommended by PT    Recommendations for Other Services       Precautions / Restrictions Precautions Precautions: Fall Restrictions Weight Bearing Restrictions: No      Mobility  Bed Mobility Overal bed mobility: Needs Assistance Bed Mobility: Rolling Rolling: Min guard         General bed mobility comments: close guard for safety, lines. Increased time.   Transfers                 General transfer comment: NT-pt refused OOB attempt due to still feeling weak  Ambulation/Gait                Stairs            Wheelchair Mobility    Modified Rankin (Stroke Patients Only)       Balance                                             Pertinent Vitals/Pain Pain Assessment: Faces Faces Pain Scale: Hurts even more Pain Location: R shoulder Pain Descriptors / Indicators: Sore;Grimacing;Aching Pain Intervention(s): Limited activity within patient's tolerance    Home Living Family/patient expects to be discharged to:: Private residence Living Arrangements: Other relatives (grandchildren) Available Help at Discharge: Family Type of Home: House Home Access: Stairs to enter Entrance  Stairs-Rails: None Secretary/administratorntrance Stairs-Number of Steps: 2 Home Layout: One level Home Equipment: Environmental consultantWalker - 2 wheels      Prior Function Level of Independence: Independent with assistive device(s)         Comments: pt states she goes to adult daycare M-F 8-5     Hand Dominance        Extremity/Trunk Assessment   Upper Extremity Assessment: Generalized weakness           Lower Extremity Assessment: Generalized weakness         Communication   Communication: No difficulties  Cognition Arousal/Alertness: Awake/alert Behavior During Therapy: WFL for tasks assessed/performed Overall Cognitive Status: Within Functional Limits for tasks assessed                      General Comments      Exercises     Assessment/Plan    PT Assessment Patient needs continued PT services  PT Problem List Decreased strength;Decreased mobility;Decreased range of motion;Decreased activity tolerance;Decreased balance;Pain;Decreased knowledge of use of DME          PT Treatment Interventions DME instruction;Gait training;Therapeutic activities;Therapeutic exercise;Balance training;Patient/family education    PT Goals (Current goals can be found in the Care Plan section)  Acute Rehab PT Goals Patient Stated Goal: to be able  to walk to the bathroom PT Goal Formulation: With patient Time For Goal Achievement: 09/27/16 Potential to Achieve Goals: Good    Frequency Min 3X/week   Barriers to discharge        Co-evaluation               End of Session   Activity Tolerance: Patient tolerated treatment well Patient left: in bed;with call bell/phone within reach;with family/visitor present;with bed alarm set           Time: 1431-1449 PT Time Calculation (min) (ACUTE ONLY): 18 min   Charges:   PT Evaluation $PT Eval Low Complexity: 1 Procedure     PT G Codes:        Rebeca Alert, MPT Pager: [phone]

## 2016-09-13 NOTE — Progress Notes (Addendum)
Patient ID: Desiree Hutchinson, female   DOB: 02/22/1920, 82 y.o.   MRN: [id number]  PROGRESS NOTE    Desiree Hutchinson  WGN:[id number] DOB: 02/22/1920 DOA: 09/12/2016  PCP: Romero BellingELLISON, SEAN, MD   Brief Narrative:  82 -year-old female with past medical history significant for COPD, CHF. [facility] with large amount of blood per rectum. She had a suspected diverticular bleed in 2016, history of small bowel obstruction.  In ED, BP was 65/33, HR 83, RR 22, T 96.5 F and oxygen saturation 99%. Blood work was notable for hemoglobin of 9, BUN 34, creatinine 1.24. GI has seen in consultation. NG lavage returned clear gastric secretions without the blood so suspected lower GI bleed. She has received 2 units of PRBC transfusion so far. She was seen by IR and underwent mesenteric angiogram and embolization of actively bleeding branch of the ileocecal artery 09/13/2016.  Assessment & Plan:   Acute lower GI bleed in cecum and ascending colon likely diverticular / hemorrhagic shock / Acute blood loss anemia  - Patient hypotensive with blood pressure as low as 65/33 in the setting of acute lower GI bleed - Appreciate critical care following for pressor support.  - Pt is s/p mesenteric angiogram and embolization of actively bleeding branch of the ileocecal artery 09/13/2016 - She has received 3 U PRBC transfusion since admission  - Tagged RBC scan showed active GI bleed in cecum and ascending colon - Continue to monitor in SDU unit this patient requires pressors - Continue PPI infusion and protonix 40 mg BID  Thrombocytopenia - Likely in the setting of bleeding - Bleeding stopped now - Platelets 49  Troponin elevation - Likely demand ischemia secondary to GI bleed - Troponin level 0.06, 0.07 - No reports of chest pain  Leukocytosis - Unclear etiology - No reports of fevers - We'll repeat CBC tomorrow  Chronic kidney disease stage III - Baseline creatinine 1.26 about 7 months  ago - Creatinine within baseline range   DVT prophylaxis: SCD's bilaterally  Code Status: DNR/DNI other than pressor support  Family Communication: son at the bedside this am Disposition Plan: remains in SDU as she is on pressor support    Consultants:   GI  IR  Pulmonary   Procedures:   Placement of a right IJ triple lumen central line 09/13/2016  Mesenteric angiogram and embolization of actively bleeding branch of the ileocecal artery 09/13/2016  2 D ECHO 9/18 - EF 65%, grade 2 DD  Antimicrobials:   None    Subjective: No overnight events.   Objective: Vitals:   09/13/16 0800 09/13/16 0810 09/13/16 0827 09/13/16 0845  BP: (!) 132/48  (!) 126/43 (!) 136/52  Pulse: 86  85 83  Resp: (!) 23  20 18   Temp:  99.7 F (37.6 C)    TempSrc:  Oral    SpO2: 100%  99% 100%  Weight:      Height:        Intake/Output Summary (Last 24 hours) at 09/13/16 1028 Last data filed at 09/13/16 0800  Gross per 24 hour  Intake          4320.92 ml  Output              500 ml  Net          3820.92 ml   Filed Weights   09/12/16 0515  Weight: 44.1 kg (97 lb 3.6 oz)    Examination:  General exam: Appears calm and  comfortable  Respiratory system: Clear to auscultation. Respiratory effort normal. Cardiovascular system: S1 & S2 heard, RRR. No JVD, murmurs, rubs, gallops or clicks. No pedal edema. Gastrointestinal system: Abdomen is nondistended, soft and nontender. No organomegaly or masses felt. Normal bowel sounds heard. Central nervous system: Alert and oriented. No focal neurological deficits. Extremities: Symmetric 5 x 5 power. Skin: No rashes, lesions or ulcers Psychiatry: Judgement and insight appear normal. Mood & affect appropriate.   Data Reviewed: I have personally reviewed following labs and imaging studies  CBC:  Recent Labs Lab 09/12/16 0137 09/12/16 0200 09/12/16 1435 09/12/16 1610 09/13/16 0320  WBC 8.3  --   --  22.8*  --   NEUTROABS 5.7  --   --   20.8*  --   HGB 9.0* 8.8* 8.7* 8.5* 8.3*  HCT 27.7* 26.0* 25.3* 24.7* 23.3*  MCV 95.2  --   --  89.5  --   PLT 157  --   --  49*  --    Basic Metabolic Panel:  Recent Labs Lab 09/12/16 0137 09/12/16 0200 09/12/16 1610  NA 138 138 144  K 4.3 4.3 4.1  CL 112* 111 127*  CO2 20*  --  10*  GLUCOSE 197* 191* 182*  BUN 34* 35* 26*  CREATININE 1.24* 1.30* 1.17*  CALCIUM 9.0  --  6.5*   GFR: Estimated Creatinine Clearance: 20.5 mL/min (by C-G formula based on SCr of 1.17 mg/dL (H)). Liver Function Tests:  Recent Labs Lab 09/12/16 0137 09/12/16 1610  AST 21 22  ALT 9* 12*  ALKPHOS 85 42  BILITOT 0.3 0.5  PROT 6.0* 3.5*  ALBUMIN 3.6 2.0*   No results for input(s): LIPASE, AMYLASE in the last 168 hours. No results for input(s): AMMONIA in the last 168 hours. Coagulation Profile:  Recent Labs Lab 09/12/16 1610  INR 1.72   Cardiac Enzymes:  Recent Labs Lab 09/12/16 1610 09/12/16 1955 09/13/16 0320  TROPONINI 0.06* 0.07* 0.07*   BNP (last 3 results) No results for input(s): PROBNP in the last 8760 hours. HbA1C: No results for input(s): HGBA1C in the last 72 hours. CBG: No results for input(s): GLUCAP in the last 168 hours. Lipid Profile: No results for input(s): CHOL, HDL, LDLCALC, TRIG, CHOLHDL, LDLDIRECT in the last 72 hours. Thyroid Function Tests:  Recent Labs  09/12/16 1610  TSH 2.855   Anemia Panel: No results for input(s): VITAMINB12, FOLATE, FERRITIN, TIBC, IRON, RETICCTPCT in the last 72 hours. Urine analysis:    Component Value Date/Time   COLORURINE YELLOW 08/18/2015 1109   APPEARANCEUR CLEAR 08/18/2015 1109   LABSPEC 1.010 08/18/2015 1109   PHURINE 7.0 08/18/2015 1109   GLUCOSEU NEGATIVE 08/18/2015 1109   GLUCOSEU NEGATIVE 01/10/2015 1611   HGBUR NEGATIVE 08/18/2015 1109   BILIRUBINUR NEGATIVE 08/18/2015 1109   KETONESUR NEGATIVE 08/18/2015 1109   PROTEINUR NEGATIVE 08/18/2015 1109   UROBILINOGEN 0.2 08/18/2015 1109   NITRITE NEGATIVE  08/18/2015 1109   LEUKOCYTESUR NEGATIVE 08/18/2015 1109   Sepsis Labs: @LABRCNTIP (procalcitonin:4,lacticidven:4)    Recent Results (from the past 240 hour(s))  MRSA PCR Screening     Status: None   Collection Time: 09/12/16  4:37 AM  Result Value Ref Range Status   MRSA by PCR NEGATIVE NEGATIVE Final      Radiology Studies: Nm Gi Blood Loss Result Date: 09/12/2016 Active gastrointestinal bleeding initiating in the cecum / ascending colon.   Ir Angiogram Visceral Selective Result Date: 09/12/2016  1. Successful placement of a right IJ approach triple-lumen  central venous catheter. The catheter tip is at the cavoatrial junction and ready for use. 2. Angiography demonstrates active brisk bleeding from a branch of the marginal artery in the proximal to mid ascending colon. 3. Successful coil embolization of the actively bleeding artery. Signed, Sterling Big, MD [facility] Radiology Electronically Signed   By: Malachy Moan M.D.   On: 09/12/2016 12:28   Ir Angiogram Selective Each Additional Vessel Result Date: 09/13/2016 1. Successful placement of a right IJ approach triple-lumen central venous catheter. The catheter tip is at the cavoatrial junction and ready for use. 2. Angiography demonstrates active brisk bleeding from a branch of the marginal artery in the proximal to mid ascending colon. 3. Successful coil embolization of the actively bleeding artery. Signed, Sterling Big, MD [facility] Radiology Electronically Signed   By: Malachy Moan M.D.   On: 09/12/2016 12:28   Ir Angiogram Selective Each Additional Vessel Result Date: 09/12/2016 1. Successful placement of a right IJ approach triple-lumen central venous catheter. The catheter tip is at the cavoatrial junction and ready for use. 2. Angiography demonstrates active brisk bleeding from a branch of the marginal  artery in the proximal to mid ascending colon. 3. Successful coil embolization of the actively bleeding artery. Signed, Sterling Big, MD [facility] Radiology Electronically Signed   By: Malachy Moan M.D.   On: 09/12/2016 12:28   Ir Angiogram Selective Each Additional Vessel Result Date: 09/12/2016 1. Successful placement of a right IJ approach triple-lumen central venous catheter. The catheter tip is at the cavoatrial junction and ready for use. 2. Angiography demonstrates active brisk bleeding from a branch of the marginal artery in the proximal to mid ascending colon. 3. Successful coil embolization of the actively bleeding artery. Signed, Sterling Big, MD [facility] Radiology Electronically Signed   By: Malachy Moan M.D.   On: 09/12/2016 12:28   Ir Angiogram Selective Each Additional Vessel Result Date: 09/12/2016 1. Successful placement of a right IJ approach triple-lumen central venous catheter. The catheter tip is at the cavoatrial junction and ready for use. 2. Angiography demonstrates active brisk bleeding from a branch of the marginal artery in the proximal to mid ascending colon. 3. Successful coil embolization of the actively bleeding artery. Signed, Sterling Big, MD [facility] Radiology Electronically Signed   By: Malachy Moan M.D.   On: 09/12/2016 12:28   Ir Angiogram Selective Each Additional Vessel Result Date: 09/12/2016 1. Successful placement of a right IJ approach triple-lumen central venous catheter. The catheter tip is at the cavoatrial junction and ready for use. 2. Angiography demonstrates active brisk bleeding from a branch of the marginal artery in the proximal to mid ascending colon. 3. Successful coil embolization of the actively bleeding artery. Signed, Sterling Big, MD [facility] Radiology Electronically Signed   By: Malachy Moan M.D.   On: 09/12/2016 12:28   Ir Fluoro Guide Cv Line Right Result Date: 09/12/2016 1. Successful placement of a right IJ approach triple-lumen central venous catheter. The catheter tip is at the cavoatrial junction and ready for use. 2. Angiography demonstrates active brisk bleeding from a branch of the marginal artery in the proximal to mid ascending colon. 3. Successful coil embolization of the actively bleeding artery. Signed, Sterling Big, MD [facility] Radiology Electronically Signed  By: Malachy Moan M.D.   On: 09/12/2016 12:28   Ir US Guide Vasc Access Right Result Date: 09/12/2016 1. Successful placement of a right IJ approach triple-lumen central venous catheter. The catheter tip is at the cavoatrial junction and ready for use. 2. Angiography demonstrates active brisk bleeding from a branch of the marginal artery in the proximal to mid ascending colon. 3. Successful coil embolization of the actively bleeding artery. Signed, Sterling Big, MD [facility] Radiology Electronically Signed   By: Malachy Moan M.D.   On: 09/12/2016 12:28   Ir US Guide Vasc Access Right Result Date: 09/12/2016 1. Successful placement of a right IJ approach triple-lumen central venous catheter. The catheter tip is at the cavoatrial junction and ready for use. 2. Angiography demonstrates active brisk bleeding from a branch of the marginal artery in the proximal to mid ascending colon. 3. Successful coil embolization of the actively bleeding artery. Signed, Sterling Big, MD [facility] Radiology Electronically Signed   By: Malachy Moan M.D.   On: 09/12/2016 12:28   Dg Chest Portable 1 View Result Date: 09/12/2016 Chronic interstitial  thickening.  No evidence of acute abnormality. Thoracic aortic atherosclerosis.  Ir Rebekah [place] [facility] [employer] Guide Roadmapping Result Date: 09/12/2016 1. Successful placement of a right IJ approach triple-lumen central venous catheter. The catheter tip is at the cavoatrial junction and ready for use. 2. Angiography demonstrates active brisk bleeding from a branch of the marginal artery in the proximal to mid ascending colon. 3. Successful coil embolization of the actively bleeding artery.      Scheduled Meds: . sodium chloride  10 mL/hr Intravenous Once  . chlorhexidine  15 mL Mouth Rinse BID  . mouth rinse  15 mL Mouth Rinse q12n4p  . [START ON 09/15/2016] pantoprazole  40 mg Intravenous Q12H  . tiotropium  18 mcg Inhalation Daily   Continuous Infusions: . sodium chloride 125 mL/hr at 09/13/16 0946  . norepinephrine (LEVOPHED) Adult infusion 4 mcg/min (09/13/16 0810)  . pantoprozole (PROTONIX) infusion 8 mg/hr (09/13/16 0800)     LOS: 1 day    Time spent: 25 minutes  Greater than 50% of the time spent on counseling and coordinating the care.   Manson Passey, MD [facility] Pager [phone]  If 7PM-7AM, please contact night-coverage [email or web address] Password [facility] 09/13/2016, 10:28 AM

## 2016-09-13 NOTE — Progress Notes (Signed)
HGB 6.7

## 2016-09-14 LAB — TYPE AND SCREEN
ABO/RH(D): O POS
Antibody Screen: NEGATIVE
Unit division: 0
Unit division: 0
Unit division: 0
Unit division: 0
Unit division: 0

## 2016-09-14 LAB — HEMOGLOBIN AND HEMATOCRIT, BLOOD
HCT: 23 % — ABNORMAL LOW (ref 36.0–46.0)
HCT: 22.3 % — ABNORMAL LOW (ref 36.0–46.0)
Hemoglobin: 8 g/dL — ABNORMAL LOW (ref 12.0–15.0)
Hemoglobin: 7.8 g/dL — ABNORMAL LOW (ref 12.0–15.0)

## 2016-09-14 LAB — CBC
HCT: 21.8 % — ABNORMAL LOW (ref 36.0–46.0)
Hemoglobin: 7.6 g/dL — ABNORMAL LOW (ref 12.0–15.0)
MCH: 30.6 pg (ref 26.0–34.0)
MCHC: 34.9 g/dL (ref 30.0–36.0)
MCV: 87.9 fL (ref 78.0–100.0)
Platelets: 38 10*3/uL — ABNORMAL LOW (ref 150–400)
RBC: 2.48 MIL/uL — ABNORMAL LOW (ref 3.87–5.11)
RDW: 15.4 % (ref 11.5–15.5)
WBC: 10.1 10*3/uL (ref 4.0–10.5)

## 2016-09-14 LAB — BASIC METABOLIC PANEL
Anion gap: 0 — ABNORMAL LOW (ref 5–15)
BUN: 18 mg/dL (ref 6–20)
CO2: 18 mmol/L — ABNORMAL LOW (ref 22–32)
Calcium: 7.4 mg/dL — ABNORMAL LOW (ref 8.9–10.3)
Chloride: 124 mmol/L — ABNORMAL HIGH (ref 101–111)
Creatinine, Ser: 1.07 mg/dL — ABNORMAL HIGH (ref 0.44–1.00)
GFR calc Af Amer: 50 mL/min — ABNORMAL LOW (ref >60)
GFR calc non Af Amer: 43 mL/min — ABNORMAL LOW (ref >60)
Glucose, Bld: 78 mg/dL (ref 65–99)
Potassium: 3.9 mmol/L (ref 3.5–5.1)
Sodium: 141 mmol/L (ref 135–145)

## 2016-09-14 MED ORDER — SODIUM CHLORIDE 0.9% FLUSH: 10.0000 mL | INTRAVENOUS | Status: DC | PRN

## 2016-09-14 MED ORDER — SODIUM CHLORIDE 0.9% FLUSH: 10.0000 mL | Freq: Two times a day (BID) | INTRAVENOUS | Status: DC

## 2016-09-14 NOTE — Progress Notes (Signed)
Patient ID: Desiree Hutchinson, female   DOB: [birth year], 82 y.o.   MRN: [id number]  PROGRESS NOTE    Desiree Hutchinson  WUJ:[id number] DOB: Feb 17, 1920 DOA: 09/12/2016  PCP: [name], MD   Brief Narrative:  82 -year-old female with past medical history significant for COPD, CHF. She presented to [facility] long hospital with large amount of blood per rectum. She had a suspected diverticular bleed in 2016, history of small bowel obstruction.  In ED, BP was 65/33, HR 83, RR 22, T 96.5 F and oxygen saturation 99%. Blood work was notable for hemoglobin of 9, BUN 34, creatinine 1.24. GI has seen in consultation. NG lavage returned clear gastric secretions without the blood so suspected lower GI bleed. She has received 2 units of PRBC transfusion so far. She was seen by IR and underwent mesenteric angiogram and embolization of actively bleeding branch of the ileocecal artery 09/13/2016.  Assessment & Plan:   Acute lower GI bleed in cecum and ascending colon likely diverticular / hemorrhagic shock / Acute blood loss anemia  - Patient hypotensive with blood pressure as low as 65/33 in the setting of acute lower GI bleed - Tagged RBC scan showed active GI bleed in cecum and ascending colon - Pt required pressor support on admission and CCM was consulted for help with management of pressors  - Pt is s/p mesenteric angiogram and embolization of actively bleeding branch of the ileocecal artery 09/13/2016 - She has received 3 U PRBC transfusion since admission  - Continue PPI infusion and protonix 40 mg BID - Stable for transfer to telemetry unit today   Thrombocytopenia - Likely in the setting of bleeding - Platelets down to 38, will continue to monitor daily CBC  Troponin elevation - Likely demand ischemia secondary to GI bleed - Troponin level 0.06, 0.07 - No reports of chest pain  Leukocytosis - Unclear etiology - No reports of fevers - Repeat WBC count WNL  Chronic kidney disease stage III -  Baseline creatinine 1.26 about 7 months ago - Creatinine within baseline range   DVT prophylaxis: SCD's bilaterally  Code Status: DNR/DNI other than pressor support  Family Communication: grandson at the bedside this am Disposition Plan: transfer to telemetry floor today    Consultants:   GI  IR  Pulmonary   Procedures:   Placement of a right IJ triple lumen central line 09/13/2016  Mesenteric angiogram and embolization of actively bleeding branch of the ileocecal artery 09/13/2016  2 D ECHO 9/18 - EF 65%, grade 2 DD  Antimicrobials:   None    Subjective: No overnight events.   Objective: Vitals:   09/14/16 0800 09/14/16 1000 09/14/16 1219 09/14/16 2043  BP:  (!) 128/47 (!) 128/50 (!) 110/59  Pulse:  71 74 70  Resp:  18 19 16   Temp: 98.3 F (36.8 C)  98.4 F (36.9 C) 98 F (36.7 C)  TempSrc: Oral  Oral Oral  SpO2:  97% 100% 98%  Weight:      Height:        Intake/Output Summary (Last 24 hours) at 09/14/16 2133 Last data filed at 09/14/16 2033  Gross per 24 hour  Intake           3477.5 ml  Output             4125 ml  Net           -647.5 ml   Filed Weights   09/12/16 0515  Weight: 44.1 kg (97  lb 3.6 oz)    Examination:  General exam: Appears calm and comfortable, no distress  Respiratory system: Clear to auscultation. No wheezing, no rhonchi  Cardiovascular system: S1 & S2 heard, Rate controlled  Gastrointestinal system: (+) BS, non tender abdomen  Central nervous system: No focal neurological deficits. Extremities: No swelling, palpable pulses  Skin: warm and dry  Psychiatry: Normal mood and behavior   Data Reviewed: I have personally reviewed following labs and imaging studies  CBC:  Recent Labs Lab 09/12/16 0137  09/12/16 1610  09/13/16 1115 09/13/16 2035 09/14/16 0340 09/14/16 1120 09/14/16 1948  WBC 8.3  --  22.8*  --   --   --  10.1  --   --   NEUTROABS 5.7  --  20.8*  --   --   --   --   --   --   HGB 9.0*  < > 8.5*  < >  7.2* 6.7* 7.6* 8.0* 7.8*  HCT 27.7*  < > 24.7*  < > 20.4* 19.1* 21.8* 23.0* 22.3*  MCV 95.2  --  89.5  --   --   --  87.9  --   --   PLT 157  --  49*  --   --   --  38*  --   --   < > = values in this interval not displayed. Basic Metabolic Panel:  Recent Labs Lab 09/12/16 0137 09/12/16 0200 09/12/16 1610 09/13/16 1115 09/14/16 0340  NA 138 138 144 140 141  K 4.3 4.3 4.1 4.0 3.9  CL 112* 111 127* 124* 124*  CO2 20*  --  10* 14* 18*  GLUCOSE 197* 191* 182* 106* 78  BUN 34* 35* 26* 26* 18  CREATININE 1.24* 1.30* 1.17* 1.31* 1.07*  CALCIUM 9.0  --  6.5* 7.2* 7.4*   GFR: Estimated Creatinine Clearance: 22.4 mL/min (by C-G formula based on SCr of 1.07 mg/dL (H)). Liver Function Tests:  Recent Labs Lab 09/12/16 0137 09/12/16 1610  AST 21 22  ALT 9* 12*  ALKPHOS 85 42  BILITOT 0.3 0.5  PROT 6.0* 3.5*  ALBUMIN 3.6 2.0*   No results for input(s): LIPASE, AMYLASE in the last 168 hours. No results for input(s): AMMONIA in the last 168 hours. Coagulation Profile:  Recent Labs Lab 09/12/16 1610  INR 1.72   Cardiac Enzymes:  Recent Labs Lab 09/12/16 1610 09/12/16 1955 09/13/16 0320  TROPONINI 0.06* 0.07* 0.07*   BNP (last 3 results) No results for input(s): PROBNP in the last 8760 hours. HbA1C: No results for input(s): HGBA1C in the last 72 hours. CBG: No results for input(s): GLUCAP in the last 168 hours. Lipid Profile: No results for input(s): CHOL, HDL, LDLCALC, TRIG, CHOLHDL, LDLDIRECT in the last 72 hours. Thyroid Function Tests:  Recent Labs  09/12/16 1610  TSH 2.855   Anemia Panel: No results for input(s): VITAMINB12, FOLATE, FERRITIN, TIBC, IRON, RETICCTPCT in the last 72 hours. Urine analysis:    Component Value Date/Time   COLORURINE YELLOW 08/18/2015 1109   APPEARANCEUR CLEAR 08/18/2015 1109   LABSPEC 1.010 08/18/2015 1109   PHURINE 7.0 08/18/2015 1109   GLUCOSEU NEGATIVE 08/18/2015 1109   GLUCOSEU NEGATIVE 01/10/2015 1611   HGBUR  NEGATIVE 08/18/2015 1109   BILIRUBINUR NEGATIVE 08/18/2015 1109   KETONESUR NEGATIVE 08/18/2015 1109   PROTEINUR NEGATIVE 08/18/2015 1109   UROBILINOGEN 0.2 08/18/2015 1109   NITRITE NEGATIVE 08/18/2015 1109   LEUKOCYTESUR NEGATIVE 08/18/2015 1109   Sepsis Labs: @LABRCNTIP (procalcitonin:4,lacticidven:4)  Recent Results (from the past 240 hour(s))  MRSA PCR Screening     Status: None   Collection Time: 09/12/16  4:37 AM  Result Value Ref Range Status   MRSA by PCR NEGATIVE NEGATIVE Final      Radiology Studies: Nm Gi Blood Loss Result Date: 09/12/2016 Active gastrointestinal bleeding initiating in the cecum / ascending colon.   Ir Angiogram Visceral Selective Result Date: 09/12/2016  1. Successful placement of a right IJ approach triple-lumen central venous catheter. The catheter tip is at the cavoatrial junction and ready for use. 2. Angiography demonstrates active brisk bleeding from a branch of the marginal artery in the proximal to mid ascending colon. 3. Successful coil embolization of the actively bleeding artery. Signed, Sterling Big, MD [facility] Radiology Electronically Signed   By: Malachy Moan M.D.   On: 09/12/2016 12:28   Ir Angiogram Selective Each Additional Vessel Result Date: 09/13/2016 1. Successful placement of a right IJ approach triple-lumen central venous catheter. The catheter tip is at the cavoatrial junction and ready for use. 2. Angiography demonstrates active brisk bleeding from a branch of the marginal artery in the proximal to mid ascending colon. 3. Successful coil embolization of the actively bleeding artery. Signed, Sterling Big, MD [facility] Radiology Electronically Signed   By: Malachy Moan M.D.   On: 09/12/2016 12:28   Ir Angiogram Selective Each Additional Vessel Result Date: 09/12/2016 1. Successful placement of a right IJ  approach triple-lumen central venous catheter. The catheter tip is at the cavoatrial junction and ready for use. 2. Angiography demonstrates active brisk bleeding from a branch of the marginal artery in the proximal to mid ascending colon. 3. Successful coil embolization of the actively bleeding artery. Signed, Sterling Big, MD [facility] Radiology Electronically Signed   By: Malachy Moan M.D.   On: 09/12/2016 12:28   Ir Angiogram Selective Each Additional Vessel Result Date: 09/12/2016 1. Successful placement of a right IJ approach triple-lumen central venous catheter. The catheter tip is at the cavoatrial junction and ready for use. 2. Angiography demonstrates active brisk bleeding from a branch of the marginal artery in the proximal to mid ascending colon. 3. Successful coil embolization of the actively bleeding artery. Signed, Sterling Big, MD [facility] Radiology Electronically Signed   By: Malachy Moan M.D.   On: 09/12/2016 12:28   Ir Angiogram Selective Each Additional Vessel Result Date: 09/12/2016 1. Successful placement of a right IJ approach triple-lumen central venous catheter. The catheter tip is at the cavoatrial junction and ready for use. 2. Angiography demonstrates active brisk bleeding from a branch of the marginal artery in the proximal to mid ascending colon. 3. Successful coil embolization of the actively bleeding artery. Signed, Sterling Big, MD [facility] Radiology Electronically Signed   By: Malachy Moan M.D.   On: 09/12/2016 12:28   Ir Angiogram Selective Each Additional Vessel Result Date: 09/12/2016 1. Successful placement of a right IJ approach triple-lumen central venous catheter. The catheter tip is at the cavoatrial junction and ready for use. 2. Angiography demonstrates active brisk bleeding  from a branch of the marginal artery in the proximal to mid ascending colon. 3. Successful coil embolization of the actively bleeding artery. Signed, Sterling Big, MD [facility] Radiology Electronically Signed   By: Isac Caddy.D.  On: 09/12/2016 12:28   Ir Fluoro Guide Cv Line Right Result Date: 09/12/2016 1. Successful placement of a right IJ approach triple-lumen central venous catheter. The catheter tip is at the cavoatrial junction and ready for use. 2. Angiography demonstrates active brisk bleeding from a branch of the marginal artery in the proximal to mid ascending colon. 3. Successful coil embolization of the actively bleeding artery. Signed, Sterling BigHeath K. McCullough, MD [facility] Electronically Signed   By: Malachy MoanHeath  McCullough M.D.   On: 09/12/2016 12:28   Ir Koreas Guide Vasc Access Right Result Date: 09/12/2016 1. Successful placement of a right IJ approach triple-lumen central venous catheter. The catheter tip is at the cavoatrial junction and ready for use. 2. Angiography demonstrates active brisk bleeding from a branch of the marginal artery in the proximal to mid ascending colon. 3. Successful coil embolization of the actively bleeding artery. Signed, Sterling BigHeath K. McCullough, MD [facility] Electronically Signed   By: Malachy MoanHeath  McCullough M.D.   On: 09/12/2016 12:28   Ir Koreas Guide Vasc Access Right Result Date: 09/12/2016 1. Successful placement of a right IJ approach triple-lumen central venous catheter. The catheter tip is at the cavoatrial junction and ready for use. 2. Angiography demonstrates active brisk bleeding from a branch of the marginal artery in the proximal to mid ascending colon. 3. Successful coil embolization of the actively bleeding artery. Signed, Sterling BigHeath K. McCullough, MD [facility]  [facility] Electronically Signed   By: Malachy MoanHeath  McCullough M.D.   On: 09/12/2016 12:28   Dg Chest Portable 1 View Result Date: 09/12/2016 Chronic interstitial thickening.  No evidence of acute abnormality. Thoracic aortic atherosclerosis.  Ir Rebekah ChesterfieldEmbo Art  Ven Hemorr Lymph [employer]  [employer] Guide Roadmapping Result Date: 09/12/2016 1. Successful placement of a right IJ approach triple-lumen central venous catheter. The catheter tip is at the cavoatrial junction and ready for use. 2. Angiography demonstrates active brisk bleeding from a branch of the marginal artery in the proximal to mid ascending colon. 3. Successful coil embolization of the actively bleeding artery.      Scheduled Meds: . sodium chloride  10 mL/hr Intravenous Once  . chlorhexidine  15 mL Mouth Rinse BID  . mouth rinse  15 mL Mouth Rinse q12n4p  . [START ON 09/15/2016] pantoprazole  40 mg Intravenous Q12H  . sodium chloride flush  10-40 mL Intracatheter Q12H  . tiotropium  18 mcg Inhalation Daily   Continuous Infusions: . sodium chloride 125 mL/hr at 09/14/16 1818  . pantoprozole (PROTONIX) infusion 8 mg/hr (09/14/16 1526)     LOS: 2 days    Time spent: 25 minutes  Greater than 50% of the time spent on counseling and coordinating the care.   Manson PasseyEVINE, Erbie Arment, MD [facility] Pager [phone]  If 7PM-7AM, please contact night-coverage [email or web address] Password [email or web address] 09/14/2016, 9:33 PM

## 2016-09-14 NOTE — Progress Notes (Addendum)
Patient remained A&OX4 this shift, patient able to minimally assist with getting on and off of the bedpan. No bowel movement this shift. Peripheral IV sites removed D/t bleeding at insertion site, CVC remains intact and in place. One unit of blood given d/t HGB of 6.7. Post administration HGB increased to 7.6. Patient able to sleep off and on though the shift, and was able to voice needs appropriately. Blood pressure, heart rate, and oxygen saturation remained WDL

## 2016-09-14 NOTE — Consult Note (Signed)
   [facility]   09/14/2016  Malvin JohnsJessie S Kron 1920-03-25 [id number]    [facility]. Went to bedside to offer and explain [facility] with patient. Ms. Gordy LevanWalton pleasantly declined [facility] follow up. States " I just don't think I need those services". Her grandson, Fayrene FearingJames, was at bedside as well.  Accepted [facility] brochure with contact information to call in future if changes mind. Will make inpatient RNCM aware that patient declined [facility] services.  Raiford NobleAtika Darcy Barbara, MSN-Ed, RN,BSN [facility] Care Management Hospital Liaison [phone]

## 2016-09-14 NOTE — Progress Notes (Signed)
Report called to Sophia, RN

## 2016-09-14 NOTE — Progress Notes (Signed)
PT Cancellation Note  Patient Details Name: Desiree JohnsJessie S Guthmiller MRN: [id number] DOB: [birth year]   Cancelled Treatment:     HgB still low after transfusion.  Will check back another day.   Armando ReichertKropski, [name] 09/14/2016, 8:50 AM

## 2016-09-14 NOTE — Progress Notes (Signed)
Referring Physician(s): Devine,A  Supervising Physician: Oley Balm  Patient Status:  Inpatient  Chief Complaint:  Lower gastrointestinal bleed  Subjective: Pt transferred to regular floor now; reports no further bloody BM's; still weak; denies abd pain; grandson in room; received 1 additional unit of blood since yesterday  Allergies: Review of patient's allergies indicates no known allergies.  Medications: Prior to Admission medications   Medication Sig Start Date End Date Taking? Authorizing Provider  acetaminophen (TYLENOL) 325 MG tablet Take 1-2 tablets (325-650 mg total) by mouth every 6 (six) hours as needed for fever, headache, mild pain or moderate pain. 08/22/15  Yes Hollice Espy, MD  amLODipine (NORVASC) 5 MG tablet Take 5 mg by mouth daily. 11/07/15  Yes Historical Provider, MD  hydrALAZINE (APRESOLINE) 25 MG tablet TAKE 1 TABLET BY MOUTH 2 TIMES DAILY. 05/10/16  Yes Tonny Bollman, MD  Incontinence Supply Disposable (REALITY INCONTINENT BRIEFS SM) MISC 10 per day  R32 09/26/15  Yes [name], MD  lisinopril (PRINIVIL,ZESTRIL) 2.5 MG tablet TAKE 1 TABLET (2.5 MG TOTAL) BY MOUTH DAILY. 02/05/16  Yes Tonny Bollman, MD  polyethylene glycol powder (GLYCOLAX/MIRALAX) powder Take 17 g by mouth daily. Patient taking differently: Take 17 g by mouth daily as needed (for constipation).  07/23/15  Yes Renae Fickle, MD  carvedilol (COREG) 3.125 MG tablet Take 1 tablet (3.125 mg total) by mouth 2 (two) times daily with a meal. Patient not taking: Reported on 09/12/2016 08/22/15   Hollice Espy, MD  feeding supplement (BOOST / RESOURCE BREEZE) LIQD Take 1 Container by mouth 3 (three) times daily between meals. Patient not taking: Reported on 09/12/2016 11/12/15   [name], MD     Vital Signs: BP (!) 128/50 (BP Location: Left Arm)   Pulse 74   Temp 98.4 F (36.9 C) (Oral)   Resp 19   Ht 5' (1.524 m)   Wt 97 lb 3.6 oz (44.1 kg)   SpO2 100%   BMI 18.99 kg/m    Physical Exam awake/alert; abd soft,NT; rt CFA puncture site soft,NT, no hematoma; intact distal pulses  Imaging: Nm Gi Blood Loss  Result Date: 09/12/2016 CLINICAL DATA:  Gastrointestinal bleeding. EXAM: NUCLEAR MEDICINE GASTROINTESTINAL BLEEDING SCAN TECHNIQUE: Sequential abdominal images were obtained following intravenous administration of Tc-67m labeled red blood cells. RADIOPHARMACEUTICALS:  20.1 mCi Tc-59m in-vitro labeled red cells. COMPARISON:  None. FINDINGS: Immediately upon imaging, tagged red blood cells localized to the RIGHT lower quadrant. More delayed imaging demonstrates tagged red blood cells advancing to the transverse colon and LEFT colon. IMPRESSION: Active gastrointestinal bleeding initiating in the cecum / ascending colon. Findings conveyed to Archer Asa, MD on 09/12/2016  at09:39. Electronically Signed   By: Genevive Bi M.D.   On: 09/12/2016 09:40   Ir Angiogram Visceral Selective  Result Date: 09/12/2016 INDICATION: 82 year old female with large volume active lower GI bleed requiring transfusion. Nuclear medicine tagged red blood cell study localizes the bleed to the region of the cecum/ascending colon. Additionally, she has hypotension not responding to fluid resuscitation. She requires more blood products. We will place an emergent central line, administer more blood and perform visceral angiography with embolization if the bleeding source can be identified. EXAM: SELECTIVE VISCERAL ARTERIOGRAPHY; IR RIGHT FLOURO GUIDE CV LINE; IR ULTRASOUND GUIDANCE VASC ACCESS RIGHT; ADDITIONAL ARTERIOGRAPHY; IR EMBO ART VEN HEMORR LYMPH EXTRAV INC GUIDE ROADMAPPING 1. Ultrasound-guided access right internal jugular vein 2. Placement of a right IJ central venous catheter 3. Ultrasound-guided access right common femoral artery 4.  Catheterization superior mesenteric artery with arteriogram 5. Catheterization middle colic artery with arteriogram 6. Catheterization right branch of the  middle colic artery with arteriogram 7. Catheterization marginal artery with arteriogram 8. Catheterization of an un-named ascending colonic branch artery with arteriogram 9. Coil embolization of unnamed ascending colonic branch artery 10. Catheterization of the right colic artery with arteriogram Interventional Radiologist:  Sterling Big, MD MEDICATIONS: 1 unit packed red blood cells administered during the course of the procedure. ANESTHESIA/SEDATION: Fentanyl 0 mcg IV; Versed 3 mg IV Moderate Sedation Time:  59 minutes The patient was continuously monitored during the procedure by the interventional radiology nurse under my direct supervision. CONTRAST:  25mL ISOVUE-300 IOPAMIDOL (ISOVUE-300) INJECTION 61%, 65mL ISOVUE-300 IOPAMIDOL (ISOVUE-300) INJECTION 61% FLUOROSCOPY TIME:  Fluoroscopy Time: 16 minutes 30 seconds (777 mGy). COMPLICATIONS: None immediate. PROCEDURE: Informed consent was obtained from the patient following explanation of the procedure, risks, benefits and alternatives. The patient understands, agrees and consents for the procedure. All questions were addressed. A time out was performed. Maximal barrier sterile technique utilized including caps, mask, sterile gowns, sterile gloves, large sterile drape, hand hygiene, and chlorhexidine skin prep. Attention was first turned to the right neck to establish central venous access. The right internal jugular vein was interrogated with ultrasound and found to be widely patent. An image was obtained and stored for the medical record. Local anesthesia was attained by infiltration with 1% lidocaine. A small dermatotomy was made. Under real-time sonographic guidance, the vessel was punctured with an 18 gauge needle. Using standard technique, the needle was exchanged over a 0.013 inch wire and the skin tract was dilated. A then, an Arrow triple-lumen catheter was advanced over the wire and position with the tip at the cavoatrial junction. The catheter  flushed and aspirated with ease. It was flushed, capped and secured to the skin with Prolene suture. A sterile bandage was applied. Attention was then turned to the right groin. The right common femoral artery was interrogated with ultrasound and found to be widely patent. An image was obtained and stored for the medical record. Local anesthesia was attained by infiltration with 1% lidocaine. A small dermatotomy was made. Under real-time sonographic guidance, the vessel was punctured with a 21 gauge micropuncture needle. Using standard technique, the initial micro needle was exchanged over a 0.018 micro wire for a transitional 4 [place] micro sheath. The micro sheath was then exchanged over a 0.035 wire for a working 5 [place] vascular sheath. A C2 cobra catheter was then advanced over the Bentson wire into the abdominal aorta. The catheter was used to select the superior mesenteric artery. A superior mesenteric arteriogram was performed. There is obvious active extravasation occurring in the proximal to mid ascending colon from a visible branch artery arising from the marginal artery. A renegade STC micro catheter was then advanced over a Fathom wire and used to select the middle colic artery. An arteriogram was performed confirming the vascular anatomy. The micro catheter was then navigated into the right branch of the middle colic artery. Arteriogram was again performed confirming that this vessel is ultimately contiguous with the site of active bleeding. The micro catheter was then advanced into the marginal artery and arteriogram was performed. Finally, the micro catheter was advanced into the unnamed ascending colonic branch artery. Contrast injection confirms rapid active bleeding into the ascending colon. Coil embolization was performed using a combination of 2 x 40 mm fibered and soft interlock detachable coils. Post embolization arteriogram demonstrates successful embolization. There  is trace  revascularization of the distal most aspect of the bleeding artery secondary to collateral per fusion but this is minimal and only visible during high-pressure injection. To assess for other treatable sources of collateral supply, the micro catheter was brought back into the superior mesenteric artery and advanced into the right colic artery. Arteriography was performed. No evidence of active extravasation from any of the distal branches of the right colic artery. The catheters were removed. A limited right common femoral arteriogram was performed confirming common femoral arterial access. Hemostasis was attained with the assistance of a Cordis Exoseal extra arterial vascular plug. IMPRESSION: 1. Successful placement of a right IJ approach triple-lumen central venous catheter. The catheter tip is at the cavoatrial junction and ready for use. 2. Angiography demonstrates active brisk bleeding from a branch of the marginal artery in the proximal to mid ascending colon. 3. Successful coil embolization of the actively bleeding artery. Signed, Sterling Big, MD [facility] Rad[facility] Electronically Signed   By: Malachy Moan M.D.   On: 09/12/2016 12:28   Ir Angiogram Selective Each Additional Vessel  Result Date: 09/13/2016 INDICATION: 82 year old female with large volume active lower GI bleed requiring transfusion. Nuclear medicine tagged red blood cell study localizes the bleed to the region of the cecum/ascending colon. Additionally, she has hypotension not responding to fluid resuscitation. She requires more blood products. We will place an emergent central line, administer more blood and perform visceral angiography with embolization if the bleeding source can be identified. EXAM: SELECTIVE VISCERAL ARTERIOGRAPHY; IR RIGHT FLOURO GUIDE CV LINE; IR ULTRASOUND GUIDANCE VASC ACCESS RIGHT; ADDITIONAL ARTERIOGRAPHY; IR EMBO ART VEN HEMORR LYMPH EXTRAV INC GUIDE  ROADMAPPING 1. Ultrasound-guided access right internal jugular vein 2. Placement of a right IJ central venous catheter 3. Ultrasound-guided access right common femoral artery 4. Catheterization superior mesenteric artery with arteriogram 5. Catheterization middle colic artery with arteriogram 6. Catheterization right branch of the middle colic artery with arteriogram 7. Catheterization marginal artery with arteriogram 8. Catheterization of an un-named ascending colonic branch artery with arteriogram 9. Coil embolization of unnamed ascending colonic branch artery 10. Catheterization of the right colic artery with arteriogram Interventional Radiologist:  Sterling Big, MD MEDICATIONS: 1 unit packed red blood cells administered during the course of the procedure. ANESTHESIA/SEDATION: Fentanyl 0 mcg IV; Versed 3 mg IV Moderate Sedation Time:  59 minutes The patient was continuously monitored during the procedure by the interventional radiology nurse under my direct supervision. CONTRAST:  25mL ISOVUE-300 IOPAMIDOL (ISOVUE-300) INJECTION 61%, 65mL ISOVUE-300 IOPAMIDOL (ISOVUE-300) INJECTION 61% FLUOROSCOPY TIME:  Fluoroscopy Time: 16 minutes 30 seconds (777 mGy). COMPLICATIONS: None immediate. PROCEDURE: Informed consent was obtained from the patient following explanation of the procedure, risks, benefits and alternatives. The patient understands, agrees and consents for the procedure. All questions were addressed. A time out was performed. Maximal barrier sterile technique utilized including caps, mask, sterile gowns, sterile gloves, large sterile drape, hand hygiene, and chlorhexidine skin prep. Attention was first turned to the right neck to establish central venous access. The right internal jugular vein was interrogated with ultrasound and found to be widely patent. An image was obtained and stored for the medical record. Local anesthesia was attained by infiltration with 1% lidocaine. A small dermatotomy was  made. Under real-time sonographic guidance, the vessel was punctured with an 18 gauge needle. Using standard technique, the needle was exchanged over a 0.013 inch wire and the skin tract was dilated. A then, an Arrow  triple-lumen catheter was advanced over the wire and position with the tip at the cavoatrial junction. The catheter flushed and aspirated with ease. It was flushed, capped and secured to the skin with Prolene suture. A sterile bandage was applied. Attention was then turned to the right groin. The right common femoral artery was interrogated with ultrasound and found to be widely patent. An image was obtained and stored for the medical record. Local anesthesia was attained by infiltration with 1% lidocaine. A small dermatotomy was made. Under real-time sonographic guidance, the vessel was punctured with a 21 gauge micropuncture needle. Using standard technique, the initial micro needle was exchanged over a 0.018 micro wire for a transitional 4 [place] micro sheath. The micro sheath was then exchanged over a 0.035 wire for a working 5 [place] vascular sheath. A C2 cobra catheter was then advanced over the Bentson wire into the abdominal aorta. The catheter was used to select the superior mesenteric artery. A superior mesenteric arteriogram was performed. There is obvious active extravasation occurring in the proximal to mid ascending colon from a visible branch artery arising from the marginal artery. A renegade STC micro catheter was then advanced over a Fathom wire and used to select the middle colic artery. An arteriogram was performed confirming the vascular anatomy. The micro catheter was then navigated into the right branch of the middle colic artery. Arteriogram was again performed confirming that this vessel is ultimately contiguous with the site of active bleeding. The micro catheter was then advanced into the marginal artery and arteriogram was performed. Finally, the micro catheter was advanced  into the unnamed ascending colonic branch artery. Contrast injection confirms rapid active bleeding into the ascending colon. Coil embolization was performed using a combination of 2 x 40 mm fibered and soft interlock detachable coils. Post embolization arteriogram demonstrates successful embolization. There is trace revascularization of the distal most aspect of the bleeding artery secondary to collateral per fusion but this is minimal and only visible during high-pressure injection. To assess for other treatable sources of collateral supply, the micro catheter was brought back into the superior mesenteric artery and advanced into the right colic artery. Arteriography was performed. No evidence of active extravasation from any of the distal branches of the right colic artery. The catheters were removed. A limited right common femoral arteriogram was performed confirming common femoral arterial access. Hemostasis was attained with the assistance of a Cordis Exoseal extra arterial vascular plug. IMPRESSION: 1. Successful placement of a right IJ approach triple-lumen central venous catheter. The catheter tip is at the cavoatrial junction and ready for use. 2. Angiography demonstrates active brisk bleeding from a branch of the marginal artery in the proximal to mid ascending colon. 3. Successful coil embolization of the actively bleeding artery. Signed, Sterling Big, MD [facility] Radiology Electronically Signed   By: Malachy Moan M.D.   On: 09/12/2016 12:28   Ir Angiogram Selective Each Additional Vessel  Result Date: 09/12/2016 INDICATION: 82 year old female with large volume active lower GI bleed requiring transfusion. Nuclear medicine tagged red blood cell study localizes the bleed to the region of the cecum/ascending colon. Additionally, she has hypotension not responding to fluid resuscitation. She requires more blood products. We will place an  emergent central line, administer more blood and perform visceral angiography with embolization if the bleeding source can be identified. EXAM: SELECTIVE VISCERAL ARTERIOGRAPHY; IR RIGHT FLOURO GUIDE CV LINE; IR ULTRASOUND GUIDANCE VASC ACCESS RIGHT; ADDITIONAL ARTERIOGRAPHY; IR EMBO ART  VEN HEMORR LYMPH EXTRAV INC GUIDE ROADMAPPING 1. Ultrasound-guided access right internal jugular vein 2. Placement of a right IJ central venous catheter 3. Ultrasound-guided access right common femoral artery 4. Catheterization superior mesenteric artery with arteriogram 5. Catheterization middle colic artery with arteriogram 6. Catheterization right branch of the middle colic artery with arteriogram 7. Catheterization marginal artery with arteriogram 8. Catheterization of an un-named ascending colonic branch artery with arteriogram 9. Coil embolization of unnamed ascending colonic branch artery 10. Catheterization of the right colic artery with arteriogram Interventional Radiologist:  Sterling Big, MD MEDICATIONS: 1 unit packed red blood cells administered during the course of the procedure. ANESTHESIA/SEDATION: Fentanyl 0 mcg IV; Versed 3 mg IV Moderate Sedation Time:  59 minutes The patient was continuously monitored during the procedure by the interventional radiology nurse under my direct supervision. CONTRAST:  25mL ISOVUE-300 IOPAMIDOL (ISOVUE-300) INJECTION 61%, 65mL ISOVUE-300 IOPAMIDOL (ISOVUE-300) INJECTION 61% FLUOROSCOPY TIME:  Fluoroscopy Time: 16 minutes 30 seconds (777 mGy). COMPLICATIONS: None immediate. PROCEDURE: Informed consent was obtained from the patient following explanation of the procedure, risks, benefits and alternatives. The patient understands, agrees and consents for the procedure. All questions were addressed. A time out was performed. Maximal barrier sterile technique utilized including caps, mask, sterile gowns, sterile gloves, large sterile drape, hand hygiene, and chlorhexidine skin prep.  Attention was first turned to the right neck to establish central venous access. The right internal jugular vein was interrogated with ultrasound and found to be widely patent. An image was obtained and stored for the medical record. Local anesthesia was attained by infiltration with 1% lidocaine. A small dermatotomy was made. Under real-time sonographic guidance, the vessel was punctured with an 18 gauge needle. Using standard technique, the needle was exchanged over a 0.013 inch wire and the skin tract was dilated. A then, an Arrow triple-lumen catheter was advanced over the wire and position with the tip at the cavoatrial junction. The catheter flushed and aspirated with ease. It was flushed, capped and secured to the skin with Prolene suture. A sterile bandage was applied. Attention was then turned to the right groin. The right common femoral artery was interrogated with ultrasound and found to be widely patent. An image was obtained and stored for the medical record. Local anesthesia was attained by infiltration with 1% lidocaine. A small dermatotomy was made. Under real-time sonographic guidance, the vessel was punctured with a 21 gauge micropuncture needle. Using standard technique, the initial micro needle was exchanged over a 0.018 micro wire for a transitional 4 [place] micro sheath. The micro sheath was then exchanged over a 0.035 wire for a working 5 [place] vascular sheath. A C2 cobra catheter was then advanced over the Bentson wire into the abdominal aorta. The catheter was used to select the superior mesenteric artery. A superior mesenteric arteriogram was performed. There is obvious active extravasation occurring in the proximal to mid ascending colon from a visible branch artery arising from the marginal artery. A renegade STC micro catheter was then advanced over a Fathom wire and used to select the middle colic artery. An arteriogram was performed confirming the vascular anatomy. The micro catheter  was then navigated into the right branch of the middle colic artery. Arteriogram was again performed confirming that this vessel is ultimately contiguous with the site of active bleeding. The micro catheter was then advanced into the marginal artery and arteriogram was performed. Finally, the micro catheter was advanced into the unnamed ascending colonic branch artery. Contrast injection confirms rapid  active bleeding into the ascending colon. Coil embolization was performed using a combination of 2 x 40 mm fibered and soft interlock detachable coils. Post embolization arteriogram demonstrates successful embolization. There is trace revascularization of the distal most aspect of the bleeding artery secondary to collateral per fusion but this is minimal and only visible during high-pressure injection. To assess for other treatable sources of collateral supply, the micro catheter was brought back into the superior mesenteric artery and advanced into the right colic artery. Arteriography was performed. No evidence of active extravasation from any of the distal branches of the right colic artery. The catheters were removed. A limited right common femoral arteriogram was performed confirming common femoral arterial access. Hemostasis was attained with the assistance of a Cordis Exoseal extra arterial vascular plug. IMPRESSION: 1. Successful placement of a right IJ approach triple-lumen central venous catheter. The catheter tip is at the cavoatrial junction and ready for use. 2. Angiography demonstrates active brisk bleeding from a branch of the marginal artery in the proximal to mid ascending colon. 3. Successful coil embolization of the actively bleeding artery. Signed, Sterling Big, MD [facility] Radiology Electronically Signed   By: Malachy Moan M.D.   On: 09/12/2016 12:28   Ir Angiogram Selective Each Additional Vessel  Result Date:  09/12/2016 INDICATION: 82 year old female with large volume active lower GI bleed requiring transfusion. Nuclear medicine tagged red blood cell study localizes the bleed to the region of the cecum/ascending colon. Additionally, she has hypotension not responding to fluid resuscitation. She requires more blood products. We will place an emergent central line, administer more blood and perform visceral angiography with embolization if the bleeding source can be identified. EXAM: SELECTIVE VISCERAL ARTERIOGRAPHY; IR RIGHT FLOURO GUIDE CV LINE; IR ULTRASOUND GUIDANCE VASC ACCESS RIGHT; ADDITIONAL ARTERIOGRAPHY; IR EMBO ART VEN HEMORR LYMPH EXTRAV INC GUIDE ROADMAPPING 1. Ultrasound-guided access right internal jugular vein 2. Placement of a right IJ central venous catheter 3. Ultrasound-guided access right common femoral artery 4. Catheterization superior mesenteric artery with arteriogram 5. Catheterization middle colic artery with arteriogram 6. Catheterization right branch of the middle colic artery with arteriogram 7. Catheterization marginal artery with arteriogram 8. Catheterization of an un-named ascending colonic branch artery with arteriogram 9. Coil embolization of unnamed ascending colonic branch artery 10. Catheterization of the right colic artery with arteriogram Interventional Radiologist:  Sterling Big, MD MEDICATIONS: 1 unit packed red blood cells administered during the course of the procedure. ANESTHESIA/SEDATION: Fentanyl 0 mcg IV; Versed 3 mg IV Moderate Sedation Time:  59 minutes The patient was continuously monitored during the procedure by the interventional radiology nurse under my direct supervision. CONTRAST:  25mL ISOVUE-300 IOPAMIDOL (ISOVUE-300) INJECTION 61%, 65mL ISOVUE-300 IOPAMIDOL (ISOVUE-300) INJECTION 61% FLUOROSCOPY TIME:  Fluoroscopy Time: 16 minutes 30 seconds (777 mGy). COMPLICATIONS: None immediate. PROCEDURE: Informed consent was obtained from the patient following  explanation of the procedure, risks, benefits and alternatives. The patient understands, agrees and consents for the procedure. All questions were addressed. A time out was performed. Maximal barrier sterile technique utilized including caps, mask, sterile gowns, sterile gloves, large sterile drape, hand hygiene, and chlorhexidine skin prep. Attention was first turned to the right neck to establish central venous access. The right internal jugular vein was interrogated with ultrasound and found to be widely patent. An image was obtained and stored for the medical record. Local anesthesia was attained by infiltration with 1% lidocaine. A small dermatotomy was made. Under real-time sonographic guidance, the  vessel was punctured with an 18 gauge needle. Using standard technique, the needle was exchanged over a 0.013 inch wire and the skin tract was dilated. A then, an Arrow triple-lumen catheter was advanced over the wire and position with the tip at the cavoatrial junction. The catheter flushed and aspirated with ease. It was flushed, capped and secured to the skin with Prolene suture. A sterile bandage was applied. Attention was then turned to the right groin. The right common femoral artery was interrogated with ultrasound and found to be widely patent. An image was obtained and stored for the medical record. Local anesthesia was attained by infiltration with 1% lidocaine. A small dermatotomy was made. Under real-time sonographic guidance, the vessel was punctured with a 21 gauge micropuncture needle. Using standard technique, the initial micro needle was exchanged over a 0.018 micro wire for a transitional 4 [place] micro sheath. The micro sheath was then exchanged over a 0.035 wire for a working 5 [place] vascular sheath. A C2 cobra catheter was then advanced over the Bentson wire into the abdominal aorta. The catheter was used to select the superior mesenteric artery. A superior mesenteric arteriogram was  performed. There is obvious active extravasation occurring in the proximal to mid ascending colon from a visible branch artery arising from the marginal artery. A renegade STC micro catheter was then advanced over a Fathom wire and used to select the middle colic artery. An arteriogram was performed confirming the vascular anatomy. The micro catheter was then navigated into the right branch of the middle colic artery. Arteriogram was again performed confirming that this vessel is ultimately contiguous with the site of active bleeding. The micro catheter was then advanced into the marginal artery and arteriogram was performed. Finally, the micro catheter was advanced into the unnamed ascending colonic branch artery. Contrast injection confirms rapid active bleeding into the ascending colon. Coil embolization was performed using a combination of 2 x 40 mm fibered and soft interlock detachable coils. Post embolization arteriogram demonstrates successful embolization. There is trace revascularization of the distal most aspect of the bleeding artery secondary to collateral per fusion but this is minimal and only visible during high-pressure injection. To assess for other treatable sources of collateral supply, the micro catheter was brought back into the superior mesenteric artery and advanced into the right colic artery. Arteriography was performed. No evidence of active extravasation from any of the distal branches of the right colic artery. The catheters were removed. A limited right common femoral arteriogram was performed confirming common femoral arterial access. Hemostasis was attained with the assistance of a Cordis Exoseal extra arterial vascular plug. IMPRESSION: 1. Successful placement of a right IJ approach triple-lumen central venous catheter. The catheter tip is at the cavoatrial junction and ready for use. 2. Angiography demonstrates active brisk bleeding from a branch of the marginal artery in the  proximal to mid ascending colon. 3. Successful coil embolization of the actively bleeding artery. Signed, Sterling Big, MD [facility] Radiology Electronically Signed   By: Malachy Moan M.D.   On: 09/12/2016 12:28   Ir Angiogram Selective Each Additional Vessel  Result Date: 09/12/2016 INDICATION: 82 year old female with large volume active lower GI bleed requiring transfusion. Nuclear medicine tagged red blood cell study localizes the bleed to the region of the cecum/ascending colon. Additionally, she has hypotension not responding to fluid resuscitation. She requires more blood products. We will place an emergent central line, administer more blood and perform visceral angiography  with embolization if the bleeding source can be identified. EXAM: SELECTIVE VISCERAL ARTERIOGRAPHY; IR RIGHT FLOURO GUIDE CV LINE; IR ULTRASOUND GUIDANCE VASC ACCESS RIGHT; ADDITIONAL ARTERIOGRAPHY; IR EMBO ART VEN HEMORR LYMPH EXTRAV INC GUIDE ROADMAPPING 1. Ultrasound-guided access right internal jugular vein 2. Placement of a right IJ central venous catheter 3. Ultrasound-guided access right common femoral artery 4. Catheterization superior mesenteric artery with arteriogram 5. Catheterization middle colic artery with arteriogram 6. Catheterization right branch of the middle colic artery with arteriogram 7. Catheterization marginal artery with arteriogram 8. Catheterization of an un-named ascending colonic branch artery with arteriogram 9. Coil embolization of unnamed ascending colonic branch artery 10. Catheterization of the right colic artery with arteriogram Interventional Radiologist:  Sterling Big, MD MEDICATIONS: 1 unit packed red blood cells administered during the course of the procedure. ANESTHESIA/SEDATION: Fentanyl 0 mcg IV; Versed 3 mg IV Moderate Sedation Time:  59 minutes The patient was continuously monitored during the procedure by the  interventional radiology nurse under my direct supervision. CONTRAST:  25mL ISOVUE-300 IOPAMIDOL (ISOVUE-300) INJECTION 61%, 65mL ISOVUE-300 IOPAMIDOL (ISOVUE-300) INJECTION 61% FLUOROSCOPY TIME:  Fluoroscopy Time: 16 minutes 30 seconds (777 mGy). COMPLICATIONS: None immediate. PROCEDURE: Informed consent was obtained from the patient following explanation of the procedure, risks, benefits and alternatives. The patient understands, agrees and consents for the procedure. All questions were addressed. A time out was performed. Maximal barrier sterile technique utilized including caps, mask, sterile gowns, sterile gloves, large sterile drape, hand hygiene, and chlorhexidine skin prep. Attention was first turned to the right neck to establish central venous access. The right internal jugular vein was interrogated with ultrasound and found to be widely patent. An image was obtained and stored for the medical record. Local anesthesia was attained by infiltration with 1% lidocaine. A small dermatotomy was made. Under real-time sonographic guidance, the vessel was punctured with an 18 gauge needle. Using standard technique, the needle was exchanged over a 0.013 inch wire and the skin tract was dilated. A then, an Arrow triple-lumen catheter was advanced over the wire and position with the tip at the cavoatrial junction. The catheter flushed and aspirated with ease. It was flushed, capped and secured to the skin with Prolene suture. A sterile bandage was applied. Attention was then turned to the right groin. The right common femoral artery was interrogated with ultrasound and found to be widely patent. An image was obtained and stored for the medical record. Local anesthesia was attained by infiltration with 1% lidocaine. A small dermatotomy was made. Under real-time sonographic guidance, the vessel was punctured with a 21 gauge micropuncture needle. Using standard technique, the initial micro needle was exchanged over a  0.018 micro wire for a transitional 4 [place] micro sheath. The micro sheath was then exchanged over a 0.035 wire for a working 5 [place] vascular sheath. A C2 cobra catheter was then advanced over the Bentson wire into the abdominal aorta. The catheter was used to select the superior mesenteric artery. A superior mesenteric arteriogram was performed. There is obvious active extravasation occurring in the proximal to mid ascending colon from a visible branch artery arising from the marginal artery. A renegade STC micro catheter was then advanced over a Fathom wire and used to select the middle colic artery. An arteriogram was performed confirming the vascular anatomy. The micro catheter was then navigated into the right branch of the middle colic artery. Arteriogram was again performed confirming that this vessel is ultimately contiguous with the site of active bleeding. The  micro catheter was then advanced into the marginal artery and arteriogram was performed. Finally, the micro catheter was advanced into the unnamed ascending colonic branch artery. Contrast injection confirms rapid active bleeding into the ascending colon. Coil embolization was performed using a combination of 2 x 40 mm fibered and soft interlock detachable coils. Post embolization arteriogram demonstrates successful embolization. There is trace revascularization of the distal most aspect of the bleeding artery secondary to collateral per fusion but this is minimal and only visible during high-pressure injection. To assess for other treatable sources of collateral supply, the micro catheter was brought back into the superior mesenteric artery and advanced into the right colic artery. Arteriography was performed. No evidence of active extravasation from any of the distal branches of the right colic artery. The catheters were removed. A limited right common femoral arteriogram was performed confirming common femoral arterial access. Hemostasis was  attained with the assistance of a Cordis Exoseal extra arterial vascular plug. IMPRESSION: 1. Successful placement of a right IJ approach triple-lumen central venous catheter. The catheter tip is at the cavoatrial junction and ready for use. 2. Angiography demonstrates active brisk bleeding from a branch of the marginal artery in the proximal to mid ascending colon. 3. Successful coil embolization of the actively bleeding artery. Signed, Sterling Big, MD [facility] Radiology Electronically Signed   By: Malachy Moan M.D.   On: 09/12/2016 12:28   Ir Angiogram Selective Each Additional Vessel  Result Date: 09/12/2016 INDICATION: 82 year old female with large volume active lower GI bleed requiring transfusion. Nuclear medicine tagged red blood cell study localizes the bleed to the region of the cecum/ascending colon. Additionally, she has hypotension not responding to fluid resuscitation. She requires more blood products. We will place an emergent central line, administer more blood and perform visceral angiography with embolization if the bleeding source can be identified. EXAM: SELECTIVE VISCERAL ARTERIOGRAPHY; IR RIGHT FLOURO GUIDE CV LINE; IR ULTRASOUND GUIDANCE VASC ACCESS RIGHT; ADDITIONAL ARTERIOGRAPHY; IR EMBO ART VEN HEMORR LYMPH EXTRAV INC GUIDE ROADMAPPING 1. Ultrasound-guided access right internal jugular vein 2. Placement of a right IJ central venous catheter 3. Ultrasound-guided access right common femoral artery 4. Catheterization superior mesenteric artery with arteriogram 5. Catheterization middle colic artery with arteriogram 6. Catheterization right branch of the middle colic artery with arteriogram 7. Catheterization marginal artery with arteriogram 8. Catheterization of an un-named ascending colonic branch artery with arteriogram 9. Coil embolization of unnamed ascending colonic branch artery 10. Catheterization of the right colic  artery with arteriogram Interventional Radiologist:  Sterling Big, MD MEDICATIONS: 1 unit packed red blood cells administered during the course of the procedure. ANESTHESIA/SEDATION: Fentanyl 0 mcg IV; Versed 3 mg IV Moderate Sedation Time:  59 minutes The patient was continuously monitored during the procedure by the interventional radiology nurse under my direct supervision. CONTRAST:  25mL ISOVUE-300 IOPAMIDOL (ISOVUE-300) INJECTION 61%, 65mL ISOVUE-300 IOPAMIDOL (ISOVUE-300) INJECTION 61% FLUOROSCOPY TIME:  Fluoroscopy Time: 16 minutes 30 seconds (777 mGy). COMPLICATIONS: None immediate. PROCEDURE: Informed consent was obtained from the patient following explanation of the procedure, risks, benefits and alternatives. The patient understands, agrees and consents for the procedure. All questions were addressed. A time out was performed. Maximal barrier sterile technique utilized including caps, mask, sterile gowns, sterile gloves, large sterile drape, hand hygiene, and chlorhexidine skin prep. Attention was first turned to the right neck to establish central venous access. The right internal jugular vein was interrogated with ultrasound and found to be widely  patent. An image was obtained and stored for the medical record. Local anesthesia was attained by infiltration with 1% lidocaine. A small dermatotomy was made. Under real-time sonographic guidance, the vessel was punctured with an 18 gauge needle. Using standard technique, the needle was exchanged over a 0.013 inch wire and the skin tract was dilated. A then, an Arrow triple-lumen catheter was advanced over the wire and position with the tip at the cavoatrial junction. The catheter flushed and aspirated with ease. It was flushed, capped and secured to the skin with Prolene suture. A sterile bandage was applied. Attention was then turned to the right groin. The right common femoral artery was interrogated with ultrasound and found to be widely patent.  An image was obtained and stored for the medical record. Local anesthesia was attained by infiltration with 1% lidocaine. A small dermatotomy was made. Under real-time sonographic guidance, the vessel was punctured with a 21 gauge micropuncture needle. Using standard technique, the initial micro needle was exchanged over a 0.018 micro wire for a transitional 4 [place] micro sheath. The micro sheath was then exchanged over a 0.035 wire for a working 5 [place] vascular sheath. A C2 cobra catheter was then advanced over the Bentson wire into the abdominal aorta. The catheter was used to select the superior mesenteric artery. A superior mesenteric arteriogram was performed. There is obvious active extravasation occurring in the proximal to mid ascending colon from a visible branch artery arising from the marginal artery. A renegade STC micro catheter was then advanced over a Fathom wire and used to select the middle colic artery. An arteriogram was performed confirming the vascular anatomy. The micro catheter was then navigated into the right branch of the middle colic artery. Arteriogram was again performed confirming that this vessel is ultimately contiguous with the site of active bleeding. The micro catheter was then advanced into the marginal artery and arteriogram was performed. Finally, the micro catheter was advanced into the unnamed ascending colonic branch artery. Contrast injection confirms rapid active bleeding into the ascending colon. Coil embolization was performed using a combination of 2 x 40 mm fibered and soft interlock detachable coils. Post embolization arteriogram demonstrates successful embolization. There is trace revascularization of the distal most aspect of the bleeding artery secondary to collateral per fusion but this is minimal and only visible during high-pressure injection. To assess for other treatable sources of collateral supply, the micro catheter was brought back into the superior  mesenteric artery and advanced into the right colic artery. Arteriography was performed. No evidence of active extravasation from any of the distal branches of the right colic artery. The catheters were removed. A limited right common femoral arteriogram was performed confirming common femoral arterial access. Hemostasis was attained with the assistance of a Cordis Exoseal extra arterial vascular plug. IMPRESSION: 1. Successful placement of a right IJ approach triple-lumen central venous catheter. The catheter tip is at the cavoatrial junction and ready for use. 2. Angiography demonstrates active brisk bleeding from a branch of the marginal artery in the proximal to mid ascending colon. 3. Successful coil embolization of the actively bleeding artery. Signed, Sterling BigHeath K. McCullough, MD [facility] Electron[facility] Signed   By: Malachy MoanHeath  McCullough M.D.   On: 09/12/2016 12:28   Ir Fluoro Guide Cv Line Right  Result Date: 09/12/2016 INDICATION: 82 year old female with large volume active lower GI bleed requiring transfusion. Nuclear medicine tagged red blood cell study localizes the bleed to the region of the  cecum/ascending colon. Additionally, she has hypotension not responding to fluid resuscitation. She requires more blood products. We will place an emergent central line, administer more blood and perform visceral angiography with embolization if the bleeding source can be identified. EXAM: SELECTIVE VISCERAL ARTERIOGRAPHY; IR RIGHT FLOURO GUIDE CV LINE; IR ULTRASOUND GUIDANCE VASC ACCESS RIGHT; ADDITIONAL ARTERIOGRAPHY; IR EMBO ART VEN HEMORR LYMPH EXTRAV INC GUIDE ROADMAPPING 1. Ultrasound-guided access right internal jugular vein 2. Placement of a right IJ central venous catheter 3. Ultrasound-guided access right common femoral artery 4. Catheterization superior mesenteric artery with arteriogram 5. Catheterization middle colic artery with arteriogram 6.  Catheterization right branch of the middle colic artery with arteriogram 7. Catheterization marginal artery with arteriogram 8. Catheterization of an un-named ascending colonic branch artery with arteriogram 9. Coil embolization of unnamed ascending colonic branch artery 10. Catheterization of the right colic artery with arteriogram Interventional Radiologist:  Sterling Big, MD MEDICATIONS: 1 unit packed red blood cells administered during the course of the procedure. ANESTHESIA/SEDATION: Fentanyl 0 mcg IV; Versed 3 mg IV Moderate Sedation Time:  59 minutes The patient was continuously monitored during the procedure by the interventional radiology nurse under my direct supervision. CONTRAST:  25mL ISOVUE-300 IOPAMIDOL (ISOVUE-300) INJECTION 61%, 65mL ISOVUE-300 IOPAMIDOL (ISOVUE-300) INJECTION 61% FLUOROSCOPY TIME:  Fluoroscopy Time: 16 minutes 30 seconds (777 mGy). COMPLICATIONS: None immediate. PROCEDURE: Informed consent was obtained from the patient following explanation of the procedure, risks, benefits and alternatives. The patient understands, agrees and consents for the procedure. All questions were addressed. A time out was performed. Maximal barrier sterile technique utilized including caps, mask, sterile gowns, sterile gloves, large sterile drape, hand hygiene, and chlorhexidine skin prep. Attention was first turned to the right neck to establish central venous access. The right internal jugular vein was interrogated with ultrasound and found to be widely patent. An image was obtained and stored for the medical record. Local anesthesia was attained by infiltration with 1% lidocaine. A small dermatotomy was made. Under real-time sonographic guidance, the vessel was punctured with an 18 gauge needle. Using standard technique, the needle was exchanged over a 0.013 inch wire and the skin tract was dilated. A then, an Arrow triple-lumen catheter was advanced over the wire and position with the tip at  the cavoatrial junction. The catheter flushed and aspirated with ease. It was flushed, capped and secured to the skin with Prolene suture. A sterile bandage was applied. Attention was then turned to the right groin. The right common femoral artery was interrogated with ultrasound and found to be widely patent. An image was obtained and stored for the medical record. Local anesthesia was attained by infiltration with 1% lidocaine. A small dermatotomy was made. Under real-time sonographic guidance, the vessel was punctured with a 21 gauge micropuncture needle. Using standard technique, the initial micro needle was exchanged over a 0.018 micro wire for a transitional 4 [place] micro sheath. The micro sheath was then exchanged over a 0.035 wire for a working 5 [place] vascular sheath. A C2 cobra catheter was then advanced over the Bentson wire into the abdominal aorta. The catheter was used to select the superior mesenteric artery. A superior mesenteric arteriogram was performed. There is obvious active extravasation occurring in the proximal to mid ascending colon from a visible branch artery arising from the marginal artery. A renegade STC micro catheter was then advanced over a Fathom wire and used to select the middle colic artery. An arteriogram was performed confirming the vascular anatomy. The micro catheter  was then navigated into the right branch of the middle colic artery. Arteriogram was again performed confirming that this vessel is ultimately contiguous with the site of active bleeding. The micro catheter was then advanced into the marginal artery and arteriogram was performed. Finally, the micro catheter was advanced into the unnamed ascending colonic branch artery. Contrast injection confirms rapid active bleeding into the ascending colon. Coil embolization was performed using a combination of 2 x 40 mm fibered and soft interlock detachable coils. Post embolization arteriogram demonstrates successful  embolization. There is trace revascularization of the distal most aspect of the bleeding artery secondary to collateral per fusion but this is minimal and only visible during high-pressure injection. To assess for other treatable sources of collateral supply, the micro catheter was brought back into the superior mesenteric artery and advanced into the right colic artery. Arteriography was performed. No evidence of active extravasation from any of the distal branches of the right colic artery. The catheters were removed. A limited right common femoral arteriogram was performed confirming common femoral arterial access. Hemostasis was attained with the assistance of a Cordis Exoseal extra arterial vascular plug. IMPRESSION: 1. Successful placement of a right IJ approach triple-lumen central venous catheter. The catheter tip is at the cavoatrial junction and ready for use. 2. Angiography demonstrates active brisk bleeding from a branch of the marginal artery in the proximal to mid ascending colon. 3. Successful coil embolization of the actively bleeding artery. Signed, Sterling Big, MD [facility] Radiology Electronically Signed   By: Malachy Moan M.D.   On: 09/12/2016 12:28   Ir US Guide Vasc Access Right  Result Date: 09/12/2016 INDICATION: 82 year old female with large volume active lower GI bleed requiring transfusion. Nuclear medicine tagged red blood cell study localizes the bleed to the region of the cecum/ascending colon. Additionally, she has hypotension not responding to fluid resuscitation. She requires more blood products. We will place an emergent central line, administer more blood and perform visceral angiography with embolization if the bleeding source can be identified. EXAM: SELECTIVE VISCERAL ARTERIOGRAPHY; IR RIGHT FLOURO GUIDE CV LINE; IR ULTRASOUND GUIDANCE VASC ACCESS RIGHT; ADDITIONAL ARTERIOGRAPHY; IR EMBO ART VEN HEMORR LYMPH  EXTRAV INC GUIDE ROADMAPPING 1. Ultrasound-guided access right internal jugular vein 2. Placement of a right IJ central venous catheter 3. Ultrasound-guided access right common femoral artery 4. Catheterization superior mesenteric artery with arteriogram 5. Catheterization middle colic artery with arteriogram 6. Catheterization right branch of the middle colic artery with arteriogram 7. Catheterization marginal artery with arteriogram 8. Catheterization of an un-named ascending colonic branch artery with arteriogram 9. Coil embolization of unnamed ascending colonic branch artery 10. Catheterization of the right colic artery with arteriogram Interventional Radiologist:  Sterling Big, MD MEDICATIONS: 1 unit packed red blood cells administered during the course of the procedure. ANESTHESIA/SEDATION: Fentanyl 0 mcg IV; Versed 3 mg IV Moderate Sedation Time:  59 minutes The patient was continuously monitored during the procedure by the interventional radiology nurse under my direct supervision. CONTRAST:  25mL ISOVUE-300 IOPAMIDOL (ISOVUE-300) INJECTION 61%, 65mL ISOVUE-300 IOPAMIDOL (ISOVUE-300) INJECTION 61% FLUOROSCOPY TIME:  Fluoroscopy Time: 16 minutes 30 seconds (777 mGy). COMPLICATIONS: None immediate. PROCEDURE: Informed consent was obtained from the patient following explanation of the procedure, risks, benefits and alternatives. The patient understands, agrees and consents for the procedure. All questions were addressed. A time out was performed. Maximal barrier sterile technique utilized including caps, mask, sterile gowns, sterile gloves, large sterile drape, hand hygiene, and  chlorhexidine skin prep. Attention was first turned to the right neck to establish central venous access. The right internal jugular vein was interrogated with ultrasound and found to be widely patent. An image was obtained and stored for the medical record. Local anesthesia was attained by infiltration with 1% lidocaine. A small  dermatotomy was made. Under real-time sonographic guidance, the vessel was punctured with an 18 gauge needle. Using standard technique, the needle was exchanged over a 0.013 inch wire and the skin tract was dilated. A then, an Arrow triple-lumen catheter was advanced over the wire and position with the tip at the cavoatrial junction. The catheter flushed and aspirated with ease. It was flushed, capped and secured to the skin with Prolene suture. A sterile bandage was applied. Attention was then turned to the right groin. The right common femoral artery was interrogated with ultrasound and found to be widely patent. An image was obtained and stored for the medical record. Local anesthesia was attained by infiltration with 1% lidocaine. A small dermatotomy was made. Under real-time sonographic guidance, the vessel was punctured with a 21 gauge micropuncture needle. Using standard technique, the initial micro needle was exchanged over a 0.018 micro wire for a transitional 4 [place] micro sheath. The micro sheath was then exchanged over a 0.035 wire for a working 5 [place] vascular sheath. A C2 cobra catheter was then advanced over the Bentson wire into the abdominal aorta. The catheter was used to select the superior mesenteric artery. A superior mesenteric arteriogram was performed. There is obvious active extravasation occurring in the proximal to mid ascending colon from a visible branch artery arising from the marginal artery. A renegade STC micro catheter was then advanced over a Fathom wire and used to select the middle colic artery. An arteriogram was performed confirming the vascular anatomy. The micro catheter was then navigated into the right branch of the middle colic artery. Arteriogram was again performed confirming that this vessel is ultimately contiguous with the site of active bleeding. The micro catheter was then advanced into the marginal artery and arteriogram was performed. Finally, the micro  catheter was advanced into the unnamed ascending colonic branch artery. Contrast injection confirms rapid active bleeding into the ascending colon. Coil embolization was performed using a combination of 2 x 40 mm fibered and soft interlock detachable coils. Post embolization arteriogram demonstrates successful embolization. There is trace revascularization of the distal most aspect of the bleeding artery secondary to collateral per fusion but this is minimal and only visible during high-pressure injection. To assess for other treatable sources of collateral supply, the micro catheter was brought back into the superior mesenteric artery and advanced into the right colic artery. Arteriography was performed. No evidence of active extravasation from any of the distal branches of the right colic artery. The catheters were removed. A limited right common femoral arteriogram was performed confirming common femoral arterial access. Hemostasis was attained with the assistance of a Cordis Exoseal extra arterial vascular plug. IMPRESSION: 1. Successful placement of a right IJ approach triple-lumen central venous catheter. The catheter tip is at the cavoatrial junction and ready for use. 2. Angiography demonstrates active brisk bleeding from a branch of the marginal artery in the proximal to mid ascending colon. 3. Successful coil embolization of the actively bleeding artery. Signed, Sterling Big, MD [facility] Radiology Electronically Signed   By: Malachy Moan M.D.   On: 09/12/2016 12:28   Ir US Guide Vasc Access Right  Result Date: 09/12/2016 INDICATION: 82 year old female with large volume active lower GI bleed requiring transfusion. Nuclear medicine tagged red blood cell study localizes the bleed to the region of the cecum/ascending colon. Additionally, she has hypotension not responding to fluid resuscitation. She requires more blood products. We will place  an emergent central line, administer more blood and perform visceral angiography with embolization if the bleeding source can be identified. EXAM: SELECTIVE VISCERAL ARTERIOGRAPHY; IR RIGHT FLOURO GUIDE CV LINE; IR ULTRASOUND GUIDANCE VASC ACCESS RIGHT; ADDITIONAL ARTERIOGRAPHY; IR EMBO ART VEN HEMORR LYMPH EXTRAV INC GUIDE ROADMAPPING 1. Ultrasound-guided access right internal jugular vein 2. Placement of a right IJ central venous catheter 3. Ultrasound-guided access right common femoral artery 4. Catheterization superior mesenteric artery with arteriogram 5. Catheterization middle colic artery with arteriogram 6. Catheterization right branch of the middle colic artery with arteriogram 7. Catheterization marginal artery with arteriogram 8. Catheterization of an un-named ascending colonic branch artery with arteriogram 9. Coil embolization of unnamed ascending colonic branch artery 10. Catheterization of the right colic artery with arteriogram Interventional Radiologist:  Sterling Big, MD MEDICATIONS: 1 unit packed red blood cells administered during the course of the procedure. ANESTHESIA/SEDATION: Fentanyl 0 mcg IV; Versed 3 mg IV Moderate Sedation Time:  59 minutes The patient was continuously monitored during the procedure by the interventional radiology nurse under my direct supervision. CONTRAST:  25mL ISOVUE-300 IOPAMIDOL (ISOVUE-300) INJECTION 61%, 65mL ISOVUE-300 IOPAMIDOL (ISOVUE-300) INJECTION 61% FLUOROSCOPY TIME:  Fluoroscopy Time: 16 minutes 30 seconds (777 mGy). COMPLICATIONS: None immediate. PROCEDURE: Informed consent was obtained from the patient following explanation of the procedure, risks, benefits and alternatives. The patient understands, agrees and consents for the procedure. All questions were addressed. A time out was performed. Maximal barrier sterile technique utilized including caps, mask, sterile gowns, sterile gloves, large sterile drape, hand hygiene, and chlorhexidine skin  prep. Attention was first turned to the right neck to establish central venous access. The right internal jugular vein was interrogated with ultrasound and found to be widely patent. An image was obtained and stored for the medical record. Local anesthesia was attained by infiltration with 1% lidocaine. A small dermatotomy was made. Under real-time sonographic guidance, the vessel was punctured with an 18 gauge needle. Using standard technique, the needle was exchanged over a 0.013 inch wire and the skin tract was dilated. A then, an Arrow triple-lumen catheter was advanced over the wire and position with the tip at the cavoatrial junction. The catheter flushed and aspirated with ease. It was flushed, capped and secured to the skin with Prolene suture. A sterile bandage was applied. Attention was then turned to the right groin. The right common femoral artery was interrogated with ultrasound and found to be widely patent. An image was obtained and stored for the medical record. Local anesthesia was attained by infiltration with 1% lidocaine. A small dermatotomy was made. Under real-time sonographic guidance, the vessel was punctured with a 21 gauge micropuncture needle. Using standard technique, the initial micro needle was exchanged over a 0.018 micro wire for a transitional 4 [place] micro sheath. The micro sheath was then exchanged over a 0.035 wire for a working 5 [place] vascular sheath. A C2 cobra catheter was then advanced over the Bentson wire into the abdominal aorta. The catheter was used to select the superior mesenteric artery. A superior mesenteric arteriogram was performed. There is obvious active extravasation occurring in the proximal to mid ascending colon from a visible branch artery arising from the marginal artery. A  renegade STC micro catheter was then advanced over a Fathom wire and used to select the middle colic artery. An arteriogram was performed confirming the vascular anatomy. The micro  catheter was then navigated into the right branch of the middle colic artery. Arteriogram was again performed confirming that this vessel is ultimately contiguous with the site of active bleeding. The micro catheter was then advanced into the marginal artery and arteriogram was performed. Finally, the micro catheter was advanced into the unnamed ascending colonic branch artery. Contrast injection confirms rapid active bleeding into the ascending colon. Coil embolization was performed using a combination of 2 x 40 mm fibered and soft interlock detachable coils. Post embolization arteriogram demonstrates successful embolization. There is trace revascularization of the distal most aspect of the bleeding artery secondary to collateral per fusion but this is minimal and only visible during high-pressure injection. To assess for other treatable sources of collateral supply, the micro catheter was brought back into the superior mesenteric artery and advanced into the right colic artery. Arteriography was performed. No evidence of active extravasation from any of the distal branches of the right colic artery. The catheters were removed. A limited right common femoral arteriogram was performed confirming common femoral arterial access. Hemostasis was attained with the assistance of a Cordis Exoseal extra arterial vascular plug. IMPRESSION: 1. Successful placement of a right IJ approach triple-lumen central venous catheter. The catheter tip is at the cavoatrial junction and ready for use. 2. Angiography demonstrates active brisk bleeding from a branch of the marginal artery in the proximal to mid ascending colon. 3. Successful coil embolization of the actively bleeding artery. Signed, Sterling Big, MD [facility] Radiology Electronically Signed   By: Malachy Moan M.D.   On: 09/12/2016 12:28   Dg Chest Portable 1 View  Result Date: 09/12/2016 CLINICAL DATA:   Rectal bleeding since last night. Weakness. Shortness of breath. EXAM: PORTABLE CHEST 1 VIEW COMPARISON:  08/06/2015 FINDINGS: Unchanged mediastinal contours with thoracic aortic atherosclerosis. Chronic appearing increased interstitial opacities, suggesting emphysema. Calcified granuloma in the right midlung zone. No focal airspace disease, evidence of pulmonary edema, pleural effusion or pneumothorax. The bones are under mineralized. IMPRESSION: Chronic interstitial thickening.  No evidence of acute abnormality. Thoracic aortic atherosclerosis. Electronically Signed   By: Rubye Oaks M.D.   On: 09/12/2016 02:28   Ir Embo Art  [facility] [employer] Guide Roadmapping  Result Date: 09/12/2016 INDICATION: 82 year old female with large volume active lower GI bleed requiring transfusion. Nuclear medicine tagged red blood cell study localizes the bleed to the region of the cecum/ascending colon. Additionally, she has hypotension not responding to fluid resuscitation. She requires more blood products. We will place an emergent central line, administer more blood and perform visceral angiography with embolization if the bleeding source can be identified. EXAM: SELECTIVE VISCERAL ARTERIOGRAPHY; IR RIGHT FLOURO GUIDE CV LINE; IR ULTRASOUND GUIDANCE VASC ACCESS RIGHT; ADDITIONAL ARTERIOGRAPHY; IR EMBO ART VEN HEMORR LYMPH EXTRAV INC GUIDE ROADMAPPING 1. Ultrasound-guided access right internal jugular vein 2. Placement of a right IJ central venous catheter 3. Ultrasound-guided access right common femoral artery 4. Catheterization superior mesenteric artery with arteriogram 5. Catheterization middle colic artery with arteriogram 6. Catheterization right branch of the middle colic artery with arteriogram 7. Catheterization marginal artery with arteriogram 8. Catheterization of an un-named ascending colonic branch artery with arteriogram 9. Coil embolization of unnamed ascending colonic branch artery 10.  Catheterization of the right colic artery with arteriogram  Interventional Radiologist:  Sterling Big, MD MEDICATIONS: 1 unit packed red blood cells administered during the course of the procedure. ANESTHESIA/SEDATION: Fentanyl 0 mcg IV; Versed 3 mg IV Moderate Sedation Time:  59 minutes The patient was continuously monitored during the procedure by the interventional radiology nurse under my direct supervision. CONTRAST:  25mL ISOVUE-300 IOPAMIDOL (ISOVUE-300) INJECTION 61%, 65mL ISOVUE-300 IOPAMIDOL (ISOVUE-300) INJECTION 61% FLUOROSCOPY TIME:  Fluoroscopy Time: 16 minutes 30 seconds (777 mGy). COMPLICATIONS: None immediate. PROCEDURE: Informed consent was obtained from the patient following explanation of the procedure, risks, benefits and alternatives. The patient understands, agrees and consents for the procedure. All questions were addressed. A time out was performed. Maximal barrier sterile technique utilized including caps, mask, sterile gowns, sterile gloves, large sterile drape, hand hygiene, and chlorhexidine skin prep. Attention was first turned to the right neck to establish central venous access. The right internal jugular vein was interrogated with ultrasound and found to be widely patent. An image was obtained and stored for the medical record. Local anesthesia was attained by infiltration with 1% lidocaine. A small dermatotomy was made. Under real-time sonographic guidance, the vessel was punctured with an 18 gauge needle. Using standard technique, the needle was exchanged over a 0.013 inch wire and the skin tract was dilated. A then, an Arrow triple-lumen catheter was advanced over the wire and position with the tip at the cavoatrial junction. The catheter flushed and aspirated with ease. It was flushed, capped and secured to the skin with Prolene suture. A sterile bandage was applied. Attention was then turned to the right groin. The right common femoral artery was interrogated with  ultrasound and found to be widely patent. An image was obtained and stored for the medical record. Local anesthesia was attained by infiltration with 1% lidocaine. A small dermatotomy was made. Under real-time sonographic guidance, the vessel was punctured with a 21 gauge micropuncture needle. Using standard technique, the initial micro needle was exchanged over a 0.018 micro wire for a transitional 4 [place] micro sheath. The micro sheath was then exchanged over a 0.035 wire for a working 5 [place] vascular sheath. A C2 cobra catheter was then advanced over the Bentson wire into the abdominal aorta. The catheter was used to select the superior mesenteric artery. A superior mesenteric arteriogram was performed. There is obvious active extravasation occurring in the proximal to mid ascending colon from a visible branch artery arising from the marginal artery. A renegade STC micro catheter was then advanced over a Fathom wire and used to select the middle colic artery. An arteriogram was performed confirming the vascular anatomy. The micro catheter was then navigated into the right branch of the middle colic artery. Arteriogram was again performed confirming that this vessel is ultimately contiguous with the site of active bleeding. The micro catheter was then advanced into the marginal artery and arteriogram was performed. Finally, the micro catheter was advanced into the unnamed ascending colonic branch artery. Contrast injection confirms rapid active bleeding into the ascending colon. Coil embolization was performed using a combination of 2 x 40 mm fibered and soft interlock detachable coils. Post embolization arteriogram demonstrates successful embolization. There is trace revascularization of the distal most aspect of the bleeding artery secondary to collateral per fusion but this is minimal and only visible during high-pressure injection. To assess for other treatable sources of collateral supply, the micro  catheter was brought back into the superior mesenteric artery and advanced into the right colic artery. Arteriography was performed. No evidence  of active extravasation from any of the distal branches of the right colic artery. The catheters were removed. A limited right common femoral arteriogram was performed confirming common femoral arterial access. Hemostasis was attained with the assistance of a Cordis Exoseal extra arterial vascular plug. IMPRESSION: 1. Successful placement of a right IJ approach triple-lumen central venous catheter. The catheter tip is at the cavoatrial junction and ready for use. 2. Angiography demonstrates active brisk bleeding from a branch of the marginal artery in the proximal to mid ascending colon. 3. Successful coil embolization of the actively bleeding artery. Signed, Sterling Big, MD [facility] Radiology Electronically Signed   By: Malachy Moan M.D.   On: 09/12/2016 12:28    Labs:  CBC:  Recent Labs  02/13/16 1519 09/12/16 0137  09/12/16 1610 09/13/16 0320 09/13/16 1115 09/13/16 2035 09/14/16 0340  WBC 4.4 8.3  --  22.8*  --   --   --  10.1  HGB 11.4* 9.0*  < > 8.5* 8.3* 7.2* 6.7* 7.6*  HCT 34.2* 27.7*  < > 24.7* 23.3* 20.4* 19.1* 21.8*  PLT 198.0 157  --  49*  --   --   --  38*  < > = values in this interval not displayed.  COAGS:  Recent Labs  09/12/16 1610  INR 1.72  APTT 36    BMP:  Recent Labs  09/12/16 0137 09/12/16 0200 09/12/16 1610 09/13/16 1115 09/14/16 0340  NA 138 138 144 140 141  K 4.3 4.3 4.1 4.0 3.9  CL 112* 111 127* 124* 124*  CO2 20*  --  10* 14* 18*  GLUCOSE 197* 191* 182* 106* 78  BUN 34* 35* 26* 26* 18  CALCIUM 9.0  --  6.5* 7.2* 7.4*  CREATININE 1.24* 1.30* 1.17* 1.31* 1.07*  GFRNONAA 36*  --  39* 34* 43*  GFRAA 42*  --  45* 39* 50*    LIVER FUNCTION TESTS:  Recent Labs  09/12/16 0137 09/12/16 1610  BILITOT 0.3 0.5  AST 21 22  ALT 9* 12*   ALKPHOS 85 42  PROT 6.0* 3.5*  ALBUMIN 3.6 2.0*    Assessment and Plan: History lower GI bleed, status post mesenteric angiogram with embolization of actively bleeding branch of the ileocecal artery on 09/12/16; no further visible bleeding since coiling; AF;last hgb 7.6 earlier today(6.7); creat 1.07(1.31); GFR 50(39); cont current tx; monitor labs, hydrate; other plans as per primary team   Electronically Signed: D. Jeananne Rama 09/14/2016, 3:38 PM   I spent a total of 15 minutes at the the patient's bedside AND on the patient's hospital floor or unit, greater than 50% of which was counseling/coordinating care for mesenteric arteriogram with embolization

## 2016-09-15 DIAGNOSIS — D72819 Decreased white blood cell count, unspecified: Secondary | ICD-10-CM

## 2016-09-15 DIAGNOSIS — I9589 Other hypotension: Secondary | ICD-10-CM

## 2016-09-15 DIAGNOSIS — D696 Thrombocytopenia, unspecified: Secondary | ICD-10-CM

## 2016-09-15 LAB — CBC
HCT: 23.4 % — ABNORMAL LOW (ref 36.0–46.0)
Hemoglobin: 8 g/dL — ABNORMAL LOW (ref 12.0–15.0)
MCH: 30.4 pg (ref 26.0–34.0)
MCHC: 34.2 g/dL (ref 30.0–36.0)
MCV: 89 fL (ref 78.0–100.0)
Platelets: 57 10*3/uL — ABNORMAL LOW (ref 150–400)
RBC: 2.63 MIL/uL — ABNORMAL LOW (ref 3.87–5.11)
RDW: 16.1 % — ABNORMAL HIGH (ref 11.5–15.5)
WBC: 10.1 10*3/uL (ref 4.0–10.5)

## 2016-09-15 LAB — HEMOGLOBIN AND HEMATOCRIT, BLOOD
HCT: 24.7 % — ABNORMAL LOW (ref 36.0–46.0)
Hemoglobin: 8.5 g/dL — ABNORMAL LOW (ref 12.0–15.0)

## 2016-09-15 MED ORDER — FERROUS SULFATE 325 (65 FE) MG PO TBEC: 325.0000 mg | DELAYED_RELEASE_TABLET | Freq: Two times a day (BID) | ORAL | 0 refills | Status: DC

## 2016-09-15 NOTE — Progress Notes (Signed)
Pt's grandson selected [facility] for HHRN/OT. Referral given to in house rep.

## 2016-09-15 NOTE — Clinical Social Work Note (Signed)
MSW received returned phone call from patient's granddaughter, Tharon Aquasvette Bethea in regards to patient's decision to return home. Evette stated she is agreeable to patient returning home as patient does have 24 hour supervision and assistance with grandson, Fayrene FearingJames.   Evette also agreeable to home health care. No further concerns reported at this time. Patient has strong family support and will dc today, 9/20 per MD.   MSW will sign off for now as social work intervention is no longer needed. Please consult us again if new need arises.  Derenda FennelBashira Trinitee Horgan, MSW [phone] 09/15/2016 4:04 PM

## 2016-09-15 NOTE — Clinical Social Work Note (Signed)
Clinical Social Work Assessment  Patient Details  Name: Desiree Hutchinson MRN: [id number] Date of Birth: 11-18-1920  Date of referral:  09/15/16               Reason for consult:  Facility Placement, Discharge Planning                Permission sought to share information with:  Family Supports, Customer service manager, Case Optician, dispensing granted to share information::  Yes, Verbal Permission Granted  Name::      Jana Half [place], Museum/gallery conservator [place], Evette [place] and Kandee Keen  )  Agency::   (N/A )  Relationship::   (Daughter, Osie Cheeks and Granddaughter )  Sport and exercise psychologist Information:   [phone], [phone], [phone])  Housing/Transportation Living arrangements for the past 2 months:  [place] of Information:  Patient, Adult Children, Other (Comment Required) Ambulance person ) Patient Interpreter Needed:  None Criminal Activity/Legal Involvement Pertinent to Current Situation/Hospitalization:  No - Comment as needed Significant Relationships:  Adult Children, Other Family Members Lives with:  Other (Comment) (Granddaughter, Evette ) Do you feel safe going back to the place where you live?  Yes Need for family participation in patient care:  Yes (Comment)  Care giving concerns: Patient admitted from home where she lives with granddaughter, York Grice and grandson, Jeneen Rinks. PT recommending ST rehab placement however patient reported she will return home at discharge.    Social Worker assessment / plan:  MSW met with patient and grandson present at bedside in reference to post-acute placement for SNF. MSW introduced MSW role and SNF process. Patient reported that she was in a facility back in the Spring of this year however did not enjoy being away from home. Patient from home with granddaughter, York Grice and grandson, Jeneen Rinks. Patient and grandson confirmed that patient does have 24 hour supervision and assistance at home. Pt's grandson, Jeneen Rinks is with patient 24  hours a day and never leaves her alone. Patient reported before her grandson, Jeneen Rinks began caring for her; she would go to the [place] everyday. Patient stated she is no longer connected with the [facility]. Patient and grandson declined ST SNF placement. MSW placed SNF list at bedside and encouraged patient and or grandson to contact me with any questions. MSW received permission from patient to contact family.   MSW spoke with pt's dtr, Jana Half in regards to discharge planning. MSW met with pt's great-granddtr, Museum/gallery conservator who is a Therapist, sports at [employer]. Granddtr informed of patient's decision and also confirmed that patient has support at home. Grandtr reported that when patient was in SNF before she became depressed.   MSW attempted to contact granddtr, Evette who patient lives with on her cell and work phones and left messages for a returned phone call in regards to patient's plan to return home at discharge.   MSW to notify RNCM.   No further concerns reported by family at this time.   MSW will sign off for now as social work intervention is no longer needed. Please consult [place] again if new need arises.  Employment status:  Retired Forensic scientist:  Medicare PT Recommendations:  [place] / Referral to community resources:   (None at this time )  Patient/Family's Response to care:  Pt a/o x4. Pt has declined SNF placement and stated she will return home with 24 hr care. Pt has strong family support. Family supportive and strongly involved in care. Pt and family appreciated social work intervention.  Patient/Family's Understanding of and Emotional Response to Diagnosis, Current Treatment, and Prognosis:  Family knowledgeable of medical interventions and patient's request to return home at dc.   Emotional Assessment Appearance:  Appears younger than stated age Attitude/Demeanor/Rapport:   (Polite, Pleasant ) Affect (typically observed):   Appropriate, Pleasant, Happy Orientation:  Oriented to Self, Oriented to Place, Oriented to  Time, Oriented to Situation Alcohol / Substance use:  Not Applicable Psych involvement (Current and /or in the community):  No (Comment)  Discharge Needs  Concerns to be addressed:  Denies Needs/Concerns at this time Readmission within the last 30 days:  No Current discharge risk:  None Barriers to Discharge:  Continued Medical Work up   [employer], MSW [phone] 09/15/2016 11:28 AM

## 2016-09-15 NOTE — Progress Notes (Signed)
Patient and family given discharge, medication, and follow up instructions, verbalized understanding, dressing intact to R subclavian from IV site removal, no bleeding noted, family to transport home

## 2016-09-15 NOTE — Progress Notes (Signed)
Instructed pt on procedure. Pt held breath with line removal. HOB less than 45 degrees. Pressure held for 7 min. No s/sx of bleeding noted. Pressure drgs applied. Instructed pt and Cg to monitor for s/sx of bleeding and to apply pressure and report. Also instructed to keep drsg CDI. VU. Desiree MorrowHeather Ermalinda Joubert, RN VAST

## 2016-09-15 NOTE — Discharge Summary (Addendum)
Physician Discharge Summary  Desiree Hutchinson YQM:[id number] DOB: March 25, 1920 DOA: 09/12/2016  PCP: [name], MD  Admit date: 09/12/2016 Discharge date: 09/15/2016  Admitted From: home  Disposition:  home  Recommendations for Outpatient Follow-up:  1. Follow up with PCP in 1 week:  Blood pressure and anemia follow up 2. Please obtain BMP/CBC in one week  Home Health:  PT/OT/aid/RN  Equipment/Devices:  none  Discharge Condition:  Stable, improved CODE STATUS:  DNR  Diet recommendation:  regular   Brief/Interim Summary:  82 -year-old female with past medical history significant for COPD, CHF. She presented to [facility] long hospital with large amount of blood per rectum.  In ED, BP was 65/33, HR 83, RR 22, T 96.5 F and oxygen saturation 99%. Blood work was notable for hemoglobin of 9, BUN 34, creatinine 1.24. GI has seen in consultation. NG lavage returned clear gastric secretions without the blood so suspected lower GI bleed.  Tagged RBC scan demonstrated active bleeding in the cecum and ascending colon.  She received 2 units of PRBC transfusion and was seen by IR.  She underwent mesenteric angiogram and embolization of actively bleeding branch of the ileocecal artery 09/12/2016.  Her hemoglobin stabilized post-procedure and her stools have had decreasing amounts of what appears to be old blood.  She worked with PT/OT who recommended SNF, however, she was adamant to return home.  She lives with her grandson and another family member who care for her.    Discharge Diagnoses:  Active Problems:   Hypotension   Acute blood loss anemia   Gastrointestinal hemorrhage   Acute GI bleeding   Hemorrhagic shock   Hematochezia   Leukocytopenia   Thrombocytopenia (HCC)  Acute blood loss anemia and hemorrhagic shock secondary to acute lower GI bleed in cecum and ascending colon likely diverticular.  She was hypotensive to 65/33 in the emergency department with active rectal bleeding and was admitted  to the ICU.  She was given IVF and PRBC but was also given vasopressor support initially.  She was started on PPI infusion, however, suspected lower GIB which was confirmed by tagged RBC scan.  She underwent coil embolization by IR of branch of the ileocecal artery on 09/12/2016 with good results.  She transitioned off vasopressors on 9/18 and has remained hemodynamically stable since.  She has not required blood transfusion in over 24 hours and her hemoglobin has stabilized.  She will need iron supplementation and follow up with PCP in 2 weeks.  She required a total of 5 units of PRBC since admission.  Her blood pressure medications and her aspirin have been discontinued for now.    Hypertension, blood pressure medications held due to shock and hypotension.  Continue to hold until follow up with PCP in 1 week.    Thrombocytopenia, likely due to loss and consumption from GIB and starting to recover.  Nadired at 38,000.  Troponin elevation, mild and asymptomatic and likely due to anemia and hypotension.  Doubt ACS.  Defer referral to cardiology to PCP if felt indicated despite patient's age, frailty and comorbidities.  Chronic diastolic heart failure, appears euvolemic at time of discharge.  Defer diuretics due to recent GIB.  COPD, does not appear to be on maintenance medications at home.  Started on spiriva and albuterol in hospital when critically ill, but will defer further management to PCP.    Leukocytosis, likely reactive from acute anemia and shock.  She remained afebrile and her leukocytosis resolved.    Chronic kidney  disease stage III, Baseline creatinine 1.26 about 7 months ago, remained stable.    Discharge Instructions  Discharge Instructions    (HEART FAILURE PATIENTS) Call MD:  Anytime you have any of the following symptoms: 1) 3 pound weight gain in 24 hours or 5 pounds in 1 week 2) shortness of breath, with or without a dry hacking cough 3) swelling in the hands, feet or  stomach 4) if you have to sleep on extra pillows at night in order to breathe.    Complete by:  As directed    Call MD for:  difficulty breathing, headache or visual disturbances    Complete by:  As directed    Call MD for:  extreme fatigue    Complete by:  As directed    Call MD for:  hives    Complete by:  As directed    Call MD for:  persistant dizziness or light-headedness    Complete by:  As directed    Call MD for:  persistant nausea and vomiting    Complete by:  As directed    Call MD for:  severe uncontrolled pain    Complete by:  As directed    Call MD for:  temperature >100.4    Complete by:  As directed    Diet general    Complete by:  As directed    Increase activity slowly    Complete by:  As directed        Medication List    STOP taking these medications   amLODipine 5 MG tablet Commonly known as:  NORVASC   carvedilol 3.125 MG tablet Commonly known as:  COREG   hydrALAZINE 25 MG tablet Commonly known as:  APRESOLINE   lisinopril 2.5 MG tablet Commonly known as:  PRINIVIL,ZESTRIL     TAKE these medications   acetaminophen 325 MG tablet Commonly known as:  TYLENOL Take 1-2 tablets (325-650 mg total) by mouth every 6 (six) hours as needed for fever, headache, mild pain or moderate pain.   feeding supplement Liqd Take 1 Container by mouth 3 (three) times daily between meals.   ferrous sulfate 325 (65 FE) MG EC tablet Take 1 tablet (325 mg total) by mouth 2 (two) times daily.   polyethylene glycol powder powder Commonly known as:  GLYCOLAX/MIRALAX Take 17 g by mouth daily. What changed:  when to take this  reasons to take this   Reality Incontinent Briefs SM Misc 10 per day  R32      Follow-up Information    [name], MD. Schedule an appointment as soon as possible for a visit in 1 week(s).   Specialty:  Endocrinology Why:  anemia follow up.  BP med management. Contact information: [address] [place]  [address] [phone]        Ruffin Frederick, MD. Schedule an appointment as soon as possible for a visit today.   Specialty:  Gastroenterology Why:  as needed for ongoing rectal bleeding Contact information: [address] [place] [address] [phone]          No Known Allergies  Consultations: [facility], Dr. Adela Lank IR Critical Care    Procedures/Studies: Nm Gi Blood Loss  Result Date: 09/12/2016 CLINICAL DATA:  Gastrointestinal bleeding. EXAM: NUCLEAR MEDICINE GASTROINTESTINAL BLEEDING SCAN TECHNIQUE: Sequential abdominal images were obtained following intravenous administration of Tc-73m labeled red blood cells. RADIOPHARMACEUTICALS:  20.1 mCi Tc-19m in-vitro labeled red cells. COMPARISON:  None. FINDINGS: Immediately  upon imaging, tagged red blood cells localized to the RIGHT lower quadrant. More delayed imaging demonstrates tagged red blood cells advancing to the transverse colon and LEFT colon. IMPRESSION: Active gastrointestinal bleeding initiating in the cecum / ascending colon. Findings conveyed to Archer Asa, MD on 09/12/2016  at09:39. Electronically Signed   By: Genevive Bi M.D.   On: 09/12/2016 09:40   Ir Angiogram Visceral Selective  Result Date: 09/12/2016 INDICATION: 82 year old female with large volume active lower GI bleed requiring transfusion. Nuclear medicine tagged red blood cell study localizes the bleed to the region of the cecum/ascending colon. Additionally, she has hypotension not responding to fluid resuscitation. She requires more blood products. We will place an emergent central line, administer more blood and perform visceral angiography with embolization if the bleeding source can be identified. EXAM: SELECTIVE VISCERAL ARTERIOGRAPHY; IR RIGHT FLOURO GUIDE CV LINE; IR ULTRASOUND GUIDANCE VASC ACCESS RIGHT; ADDITIONAL ARTERIOGRAPHY; IR EMBO ART VEN HEMORR LYMPH EXTRAV INC GUIDE ROADMAPPING 1. Ultrasound-guided  access right internal jugular vein 2. Placement of a right IJ central venous catheter 3. Ultrasound-guided access right common femoral artery 4. Catheterization superior mesenteric artery with arteriogram 5. Catheterization middle colic artery with arteriogram 6. Catheterization right branch of the middle colic artery with arteriogram 7. Catheterization marginal artery with arteriogram 8. Catheterization of an un-named ascending colonic branch artery with arteriogram 9. Coil embolization of unnamed ascending colonic branch artery 10. Catheterization of the right colic artery with arteriogram Interventional Radiologist:  Sterling Big, MD MEDICATIONS: 1 unit packed red blood cells administered during the course of the procedure. ANESTHESIA/SEDATION: Fentanyl 0 mcg IV; Versed 3 mg IV Moderate Sedation Time:  59 minutes The patient was continuously monitored during the procedure by the interventional radiology nurse under my direct supervision. CONTRAST:  25mL ISOVUE-300 IOPAMIDOL (ISOVUE-300) INJECTION 61%, 65mL ISOVUE-300 IOPAMIDOL (ISOVUE-300) INJECTION 61% FLUOROSCOPY TIME:  Fluoroscopy Time: 16 minutes 30 seconds (777 mGy). COMPLICATIONS: None immediate. PROCEDURE: Informed consent was obtained from the patient following explanation of the procedure, risks, benefits and alternatives. The patient understands, agrees and consents for the procedure. All questions were addressed. A time out was performed. Maximal barrier sterile technique utilized including caps, mask, sterile gowns, sterile gloves, large sterile drape, hand hygiene, and chlorhexidine skin prep. Attention was first turned to the right neck to establish central venous access. The right internal jugular vein was interrogated with ultrasound and found to be widely patent. An image was obtained and stored for the medical record. Local anesthesia was attained by infiltration with 1% lidocaine. A small dermatotomy was made. Under real-time sonographic  guidance, the vessel was punctured with an 18 gauge needle. Using standard technique, the needle was exchanged over a 0.013 inch wire and the skin tract was dilated. A then, an Arrow triple-lumen catheter was advanced over the wire and position with the tip at the cavoatrial junction. The catheter flushed and aspirated with ease. It was flushed, capped and secured to the skin with Prolene suture. A sterile bandage was applied. Attention was then turned to the right groin. The right common femoral artery was interrogated with ultrasound and found to be widely patent. An image was obtained and stored for the medical record. Local anesthesia was attained by infiltration with 1% lidocaine. A small dermatotomy was made. Under real-time sonographic guidance, the vessel was punctured with a 21 gauge micropuncture needle. Using standard technique, the initial micro needle was exchanged over a 0.018 micro wire for a transitional 4 [place] micro sheath. The micro sheath  was then exchanged over a 0.035 wire for a working 5 [place] vascular sheath. A C2 cobra catheter was then advanced over the Bentson wire into the abdominal aorta. The catheter was used to select the superior mesenteric artery. A superior mesenteric arteriogram was performed. There is obvious active extravasation occurring in the proximal to mid ascending colon from a visible branch artery arising from the marginal artery. A renegade STC micro catheter was then advanced over a Fathom wire and used to select the middle colic artery. An arteriogram was performed confirming the vascular anatomy. The micro catheter was then navigated into the right branch of the middle colic artery. Arteriogram was again performed confirming that this vessel is ultimately contiguous with the site of active bleeding. The micro catheter was then advanced into the marginal artery and arteriogram was performed. Finally, the micro catheter was advanced into the unnamed ascending colonic  branch artery. Contrast injection confirms rapid active bleeding into the ascending colon. Coil embolization was performed using a combination of 2 x 40 mm fibered and soft interlock detachable coils. Post embolization arteriogram demonstrates successful embolization. There is trace revascularization of the distal most aspect of the bleeding artery secondary to collateral per fusion but this is minimal and only visible during high-pressure injection. To assess for other treatable sources of collateral supply, the micro catheter was brought back into the superior mesenteric artery and advanced into the right colic artery. Arteriography was performed. No evidence of active extravasation from any of the distal branches of the right colic artery. The catheters were removed. A limited right common femoral arteriogram was performed confirming common femoral arterial access. Hemostasis was attained with the assistance of a Cordis Exoseal extra arterial vascular plug. IMPRESSION: 1. Successful placement of a right IJ approach triple-lumen central venous catheter. The catheter tip is at the cavoatrial junction and ready for use. 2. Angiography demonstrates active brisk bleeding from a branch of the marginal artery in the proximal to mid ascending colon. 3. Successful coil embolization of the actively bleeding artery. Signed, Sterling Big, MD [facility] Radiology Electronically Signed   By: Malachy Moan M.D.   On: 09/12/2016 12:28   Ir Angiogram Selective Each Additional Vessel  Result Date: 09/13/2016 INDICATION: 82 year old female with large volume active lower GI bleed requiring transfusion. Nuclear medicine tagged red blood cell study localizes the bleed to the region of the cecum/ascending colon. Additionally, she has hypotension not responding to fluid resuscitation. She requires more blood products. We will place an emergent central line, administer more  blood and perform visceral angiography with embolization if the bleeding source can be identified. EXAM: SELECTIVE VISCERAL ARTERIOGRAPHY; IR RIGHT FLOURO GUIDE CV LINE; IR ULTRASOUND GUIDANCE VASC ACCESS RIGHT; ADDITIONAL ARTERIOGRAPHY; IR EMBO ART VEN HEMORR LYMPH EXTRAV INC GUIDE ROADMAPPING 1. Ultrasound-guided access right internal jugular vein 2. Placement of a right IJ central venous catheter 3. Ultrasound-guided access right common femoral artery 4. Catheterization superior mesenteric artery with arteriogram 5. Catheterization middle colic artery with arteriogram 6. Catheterization right branch of the middle colic artery with arteriogram 7. Catheterization marginal artery with arteriogram 8. Catheterization of an un-named ascending colonic branch artery with arteriogram 9. Coil embolization of unnamed ascending colonic branch artery 10. Catheterization of the right colic artery with arteriogram Interventional Radiologist:  Sterling Big, MD MEDICATIONS: 1 unit packed red blood cells administered during the course of the procedure. ANESTHESIA/SEDATION: Fentanyl 0 mcg IV; Versed 3 mg IV Moderate Sedation Time:  69  minutes The patient was continuously monitored during the procedure by the interventional radiology nurse under my direct supervision. CONTRAST:  25mL ISOVUE-300 IOPAMIDOL (ISOVUE-300) INJECTION 61%, 65mL ISOVUE-300 IOPAMIDOL (ISOVUE-300) INJECTION 61% FLUOROSCOPY TIME:  Fluoroscopy Time: 16 minutes 30 seconds (777 mGy). COMPLICATIONS: None immediate. PROCEDURE: Informed consent was obtained from the patient following explanation of the procedure, risks, benefits and alternatives. The patient understands, agrees and consents for the procedure. All questions were addressed. A time out was performed. Maximal barrier sterile technique utilized including caps, mask, sterile gowns, sterile gloves, large sterile drape, hand hygiene, and chlorhexidine skin prep. Attention was first turned to the right  neck to establish central venous access. The right internal jugular vein was interrogated with ultrasound and found to be widely patent. An image was obtained and stored for the medical record. Local anesthesia was attained by infiltration with 1% lidocaine. A small dermatotomy was made. Under real-time sonographic guidance, the vessel was punctured with an 18 gauge needle. Using standard technique, the needle was exchanged over a 0.013 inch wire and the skin tract was dilated. A then, an Arrow triple-lumen catheter was advanced over the wire and position with the tip at the cavoatrial junction. The catheter flushed and aspirated with ease. It was flushed, capped and secured to the skin with Prolene suture. A sterile bandage was applied. Attention was then turned to the right groin. The right common femoral artery was interrogated with ultrasound and found to be widely patent. An image was obtained and stored for the medical record. Local anesthesia was attained by infiltration with 1% lidocaine. A small dermatotomy was made. Under real-time sonographic guidance, the vessel was punctured with a 21 gauge micropuncture needle. Using standard technique, the initial micro needle was exchanged over a 0.018 micro wire for a transitional 4 [place] micro sheath. The micro sheath was then exchanged over a 0.035 wire for a working 5 [place] vascular sheath. A C2 cobra catheter was then advanced over the Bentson wire into the abdominal aorta. The catheter was used to select the superior mesenteric artery. A superior mesenteric arteriogram was performed. There is obvious active extravasation occurring in the proximal to mid ascending colon from a visible branch artery arising from the marginal artery. A renegade STC micro catheter was then advanced over a Fathom wire and used to select the middle colic artery. An arteriogram was performed confirming the vascular anatomy. The micro catheter was then navigated into the right branch  of the middle colic artery. Arteriogram was again performed confirming that this vessel is ultimately contiguous with the site of active bleeding. The micro catheter was then advanced into the marginal artery and arteriogram was performed. Finally, the micro catheter was advanced into the unnamed ascending colonic branch artery. Contrast injection confirms rapid active bleeding into the ascending colon. Coil embolization was performed using a combination of 2 x 40 mm fibered and soft interlock detachable coils. Post embolization arteriogram demonstrates successful embolization. There is trace revascularization of the distal most aspect of the bleeding artery secondary to collateral per fusion but this is minimal and only visible during high-pressure injection. To assess for other treatable sources of collateral supply, the micro catheter was brought back into the superior mesenteric artery and advanced into the right colic artery. Arteriography was performed. No evidence of active extravasation from any of the distal branches of the right colic artery. The catheters were removed. A limited right common femoral arteriogram was performed confirming common femoral arterial access. Hemostasis was attained with  the assistance of a Cordis Exoseal extra arterial vascular plug. IMPRESSION: 1. Successful placement of a right IJ approach triple-lumen central venous catheter. The catheter tip is at the cavoatrial junction and ready for use. 2. Angiography demonstrates active brisk bleeding from a branch of the marginal artery in the proximal to mid ascending colon. 3. Successful coil embolization of the actively bleeding artery. Signed, Sterling Big, MD [facility] Radiology Electronically Signed   By: Malachy Moan M.D.   On: 09/12/2016 12:28   Ir Angiogram Selective Each Additional Vessel  Result Date: 09/12/2016 INDICATION: 82 year old female with large volume  active lower GI bleed requiring transfusion. Nuclear medicine tagged red blood cell study localizes the bleed to the region of the cecum/ascending colon. Additionally, she has hypotension not responding to fluid resuscitation. She requires more blood products. We will place an emergent central line, administer more blood and perform visceral angiography with embolization if the bleeding source can be identified. EXAM: SELECTIVE VISCERAL ARTERIOGRAPHY; IR RIGHT FLOURO GUIDE CV LINE; IR ULTRASOUND GUIDANCE VASC ACCESS RIGHT; ADDITIONAL ARTERIOGRAPHY; IR EMBO ART VEN HEMORR LYMPH EXTRAV INC GUIDE ROADMAPPING 1. Ultrasound-guided access right internal jugular vein 2. Placement of a right IJ central venous catheter 3. Ultrasound-guided access right common femoral artery 4. Catheterization superior mesenteric artery with arteriogram 5. Catheterization middle colic artery with arteriogram 6. Catheterization right branch of the middle colic artery with arteriogram 7. Catheterization marginal artery with arteriogram 8. Catheterization of an un-named ascending colonic branch artery with arteriogram 9. Coil embolization of unnamed ascending colonic branch artery 10. Catheterization of the right colic artery with arteriogram Interventional Radiologist:  Sterling Big, MD MEDICATIONS: 1 unit packed red blood cells administered during the course of the procedure. ANESTHESIA/SEDATION: Fentanyl 0 mcg IV; Versed 3 mg IV Moderate Sedation Time:  59 minutes The patient was continuously monitored during the procedure by the interventional radiology nurse under my direct supervision. CONTRAST:  25mL ISOVUE-300 IOPAMIDOL (ISOVUE-300) INJECTION 61%, 65mL ISOVUE-300 IOPAMIDOL (ISOVUE-300) INJECTION 61% FLUOROSCOPY TIME:  Fluoroscopy Time: 16 minutes 30 seconds (777 mGy). COMPLICATIONS: None immediate. PROCEDURE: Informed consent was obtained from the patient following explanation of the procedure, risks, benefits and alternatives.  The patient understands, agrees and consents for the procedure. All questions were addressed. A time out was performed. Maximal barrier sterile technique utilized including caps, mask, sterile gowns, sterile gloves, large sterile drape, hand hygiene, and chlorhexidine skin prep. Attention was first turned to the right neck to establish central venous access. The right internal jugular vein was interrogated with ultrasound and found to be widely patent. An image was obtained and stored for the medical record. Local anesthesia was attained by infiltration with 1% lidocaine. A small dermatotomy was made. Under real-time sonographic guidance, the vessel was punctured with an 18 gauge needle. Using standard technique, the needle was exchanged over a 0.013 inch wire and the skin tract was dilated. A then, an Arrow triple-lumen catheter was advanced over the wire and position with the tip at the cavoatrial junction. The catheter flushed and aspirated with ease. It was flushed, capped and secured to the skin with Prolene suture. A sterile bandage was applied. Attention was then turned to the right groin. The right common femoral artery was interrogated with ultrasound and found to be widely patent. An image was obtained and stored for the medical record. Local anesthesia was attained by infiltration with 1% lidocaine. A small dermatotomy was made. Under real-time sonographic guidance, the vessel was punctured  with a 21 gauge micropuncture needle. Using standard technique, the initial micro needle was exchanged over a 0.018 micro wire for a transitional 4 [place] micro sheath. The micro sheath was then exchanged over a 0.035 wire for a working 5 [place] vascular sheath. A C2 cobra catheter was then advanced over the Bentson wire into the abdominal aorta. The catheter was used to select the superior mesenteric artery. A superior mesenteric arteriogram was performed. There is obvious active extravasation occurring in the  proximal to mid ascending colon from a visible branch artery arising from the marginal artery. A renegade STC micro catheter was then advanced over a Fathom wire and used to select the middle colic artery. An arteriogram was performed confirming the vascular anatomy. The micro catheter was then navigated into the right branch of the middle colic artery. Arteriogram was again performed confirming that this vessel is ultimately contiguous with the site of active bleeding. The micro catheter was then advanced into the marginal artery and arteriogram was performed. Finally, the micro catheter was advanced into the unnamed ascending colonic branch artery. Contrast injection confirms rapid active bleeding into the ascending colon. Coil embolization was performed using a combination of 2 x 40 mm fibered and soft interlock detachable coils. Post embolization arteriogram demonstrates successful embolization. There is trace revascularization of the distal most aspect of the bleeding artery secondary to collateral per fusion but this is minimal and only visible during high-pressure injection. To assess for other treatable sources of collateral supply, the micro catheter was brought back into the superior mesenteric artery and advanced into the right colic artery. Arteriography was performed. No evidence of active extravasation from any of the distal branches of the right colic artery. The catheters were removed. A limited right common femoral arteriogram was performed confirming common femoral arterial access. Hemostasis was attained with the assistance of a Cordis Exoseal extra arterial vascular plug. IMPRESSION: 1. Successful placement of a right IJ approach triple-lumen central venous catheter. The catheter tip is at the cavoatrial junction and ready for use. 2. Angiography demonstrates active brisk bleeding from a branch of the marginal artery in the proximal to mid ascending colon. 3. Successful coil embolization of the  actively bleeding artery. Signed, Sterling Big, MD [facility] Radiology Electronically Signed   By: Malachy Moan M.D.   On: 09/12/2016 12:28   Ir Angiogram Selective Each Additional Vessel  Result Date: 09/12/2016 INDICATION: 82 year old female with large volume active lower GI bleed requiring transfusion. Nuclear medicine tagged red blood cell study localizes the bleed to the region of the cecum/ascending colon. Additionally, she has hypotension not responding to fluid resuscitation. She requires more blood products. We will place an emergent central line, administer more blood and perform visceral angiography with embolization if the bleeding source can be identified. EXAM: SELECTIVE VISCERAL ARTERIOGRAPHY; IR RIGHT FLOURO GUIDE CV LINE; IR ULTRASOUND GUIDANCE VASC ACCESS RIGHT; ADDITIONAL ARTERIOGRAPHY; IR EMBO ART VEN HEMORR LYMPH EXTRAV INC GUIDE ROADMAPPING 1. Ultrasound-guided access right internal jugular vein 2. Placement of a right IJ central venous catheter 3. Ultrasound-guided access right common femoral artery 4. Catheterization superior mesenteric artery with arteriogram 5. Catheterization middle colic artery with arteriogram 6. Catheterization right branch of the middle colic artery with arteriogram 7. Catheterization marginal artery with arteriogram 8. Catheterization of an un-named ascending colonic branch artery with arteriogram 9. Coil embolization of unnamed ascending colonic branch artery 10. Catheterization of the right colic artery with arteriogram Interventional Radiologist:  Kandis Cocking.  Archer Asa, MD MEDICATIONS: 1 unit packed red blood cells administered during the course of the procedure. ANESTHESIA/SEDATION: Fentanyl 0 mcg IV; Versed 3 mg IV Moderate Sedation Time:  59 minutes The patient was continuously monitored during the procedure by the interventional radiology nurse under my direct supervision. CONTRAST:  25mL  ISOVUE-300 IOPAMIDOL (ISOVUE-300) INJECTION 61%, 65mL ISOVUE-300 IOPAMIDOL (ISOVUE-300) INJECTION 61% FLUOROSCOPY TIME:  Fluoroscopy Time: 16 minutes 30 seconds (777 mGy). COMPLICATIONS: None immediate. PROCEDURE: Informed consent was obtained from the patient following explanation of the procedure, risks, benefits and alternatives. The patient understands, agrees and consents for the procedure. All questions were addressed. A time out was performed. Maximal barrier sterile technique utilized including caps, mask, sterile gowns, sterile gloves, large sterile drape, hand hygiene, and chlorhexidine skin prep. Attention was first turned to the right neck to establish central venous access. The right internal jugular vein was interrogated with ultrasound and found to be widely patent. An image was obtained and stored for the medical record. Local anesthesia was attained by infiltration with 1% lidocaine. A small dermatotomy was made. Under real-time sonographic guidance, the vessel was punctured with an 18 gauge needle. Using standard technique, the needle was exchanged over a 0.013 inch wire and the skin tract was dilated. A then, an Arrow triple-lumen catheter was advanced over the wire and position with the tip at the cavoatrial junction. The catheter flushed and aspirated with ease. It was flushed, capped and secured to the skin with Prolene suture. A sterile bandage was applied. Attention was then turned to the right groin. The right common femoral artery was interrogated with ultrasound and found to be widely patent. An image was obtained and stored for the medical record. Local anesthesia was attained by infiltration with 1% lidocaine. A small dermatotomy was made. Under real-time sonographic guidance, the vessel was punctured with a 21 gauge micropuncture needle. Using standard technique, the initial micro needle was exchanged over a 0.018 micro wire for a transitional 4 [place] micro sheath. The micro sheath was  then exchanged over a 0.035 wire for a working 5 [place] vascular sheath. A C2 cobra catheter was then advanced over the Bentson wire into the abdominal aorta. The catheter was used to select the superior mesenteric artery. A superior mesenteric arteriogram was performed. There is obvious active extravasation occurring in the proximal to mid ascending colon from a visible branch artery arising from the marginal artery. A renegade STC micro catheter was then advanced over a Fathom wire and used to select the middle colic artery. An arteriogram was performed confirming the vascular anatomy. The micro catheter was then navigated into the right branch of the middle colic artery. Arteriogram was again performed confirming that this vessel is ultimately contiguous with the site of active bleeding. The micro catheter was then advanced into the marginal artery and arteriogram was performed. Finally, the micro catheter was advanced into the unnamed ascending colonic branch artery. Contrast injection confirms rapid active bleeding into the ascending colon. Coil embolization was performed using a combination of 2 x 40 mm fibered and soft interlock detachable coils. Post embolization arteriogram demonstrates successful embolization. There is trace revascularization of the distal most aspect of the bleeding artery secondary to collateral per fusion but this is minimal and only visible during high-pressure injection. To assess for other treatable sources of collateral supply, the micro catheter was brought back into the superior mesenteric artery and advanced into the right colic artery. Arteriography was performed. No evidence of active extravasation from any  of the distal branches of the right colic artery. The catheters were removed. A limited right common femoral arteriogram was performed confirming common femoral arterial access. Hemostasis was attained with the assistance of a Cordis Exoseal extra arterial vascular plug.  IMPRESSION: 1. Successful placement of a right IJ approach triple-lumen central venous catheter. The catheter tip is at the cavoatrial junction and ready for use. 2. Angiography demonstrates active brisk bleeding from a branch of the marginal artery in the proximal to mid ascending colon. 3. Successful coil embolization of the actively bleeding artery. Signed, Sterling Big, MD [facility] Radiology Electronically Signed   By: Malachy Moan M.D.   On: 09/12/2016 12:28   Ir Angiogram Selective Each Additional Vessel  Result Date: 09/12/2016 INDICATION: 82 year old female with large volume active lower GI bleed requiring transfusion. Nuclear medicine tagged red blood cell study localizes the bleed to the region of the cecum/ascending colon. Additionally, she has hypotension not responding to fluid resuscitation. She requires more blood products. We will place an emergent central line, administer more blood and perform visceral angiography with embolization if the bleeding source can be identified. EXAM: SELECTIVE VISCERAL ARTERIOGRAPHY; IR RIGHT FLOURO GUIDE CV LINE; IR ULTRASOUND GUIDANCE VASC ACCESS RIGHT; ADDITIONAL ARTERIOGRAPHY; IR EMBO ART VEN HEMORR LYMPH EXTRAV INC GUIDE ROADMAPPING 1. Ultrasound-guided access right internal jugular vein 2. Placement of a right IJ central venous catheter 3. Ultrasound-guided access right common femoral artery 4. Catheterization superior mesenteric artery with arteriogram 5. Catheterization middle colic artery with arteriogram 6. Catheterization right branch of the middle colic artery with arteriogram 7. Catheterization marginal artery with arteriogram 8. Catheterization of an un-named ascending colonic branch artery with arteriogram 9. Coil embolization of unnamed ascending colonic branch artery 10. Catheterization of the right colic artery with arteriogram Interventional Radiologist:  Sterling Big, MD  MEDICATIONS: 1 unit packed red blood cells administered during the course of the procedure. ANESTHESIA/SEDATION: Fentanyl 0 mcg IV; Versed 3 mg IV Moderate Sedation Time:  59 minutes The patient was continuously monitored during the procedure by the interventional radiology nurse under my direct supervision. CONTRAST:  25mL ISOVUE-300 IOPAMIDOL (ISOVUE-300) INJECTION 61%, 65mL ISOVUE-300 IOPAMIDOL (ISOVUE-300) INJECTION 61% FLUOROSCOPY TIME:  Fluoroscopy Time: 16 minutes 30 seconds (777 mGy). COMPLICATIONS: None immediate. PROCEDURE: Informed consent was obtained from the patient following explanation of the procedure, risks, benefits and alternatives. The patient understands, agrees and consents for the procedure. All questions were addressed. A time out was performed. Maximal barrier sterile technique utilized including caps, mask, sterile gowns, sterile gloves, large sterile drape, hand hygiene, and chlorhexidine skin prep. Attention was first turned to the right neck to establish central venous access. The right internal jugular vein was interrogated with ultrasound and found to be widely patent. An image was obtained and stored for the medical record. Local anesthesia was attained by infiltration with 1% lidocaine. A small dermatotomy was made. Under real-time sonographic guidance, the vessel was punctured with an 18 gauge needle. Using standard technique, the needle was exchanged over a 0.013 inch wire and the skin tract was dilated. A then, an Arrow triple-lumen catheter was advanced over the wire and position with the tip at the cavoatrial junction. The catheter flushed and aspirated with ease. It was flushed, capped and secured to the skin with Prolene suture. A sterile bandage was applied. Attention was then turned to the right groin. The right common femoral artery was interrogated with ultrasound and found to be widely patent. An image  was obtained and stored for the medical record. Local anesthesia was  attained by infiltration with 1% lidocaine. A small dermatotomy was made. Under real-time sonographic guidance, the vessel was punctured with a 21 gauge micropuncture needle. Using standard technique, the initial micro needle was exchanged over a 0.018 micro wire for a transitional 4 [place] micro sheath. The micro sheath was then exchanged over a 0.035 wire for a working 5 [place] vascular sheath. A C2 cobra catheter was then advanced over the Bentson wire into the abdominal aorta. The catheter was used to select the superior mesenteric artery. A superior mesenteric arteriogram was performed. There is obvious active extravasation occurring in the proximal to mid ascending colon from a visible branch artery arising from the marginal artery. A renegade STC micro catheter was then advanced over a Fathom wire and used to select the middle colic artery. An arteriogram was performed confirming the vascular anatomy. The micro catheter was then navigated into the right branch of the middle colic artery. Arteriogram was again performed confirming that this vessel is ultimately contiguous with the site of active bleeding. The micro catheter was then advanced into the marginal artery and arteriogram was performed. Finally, the micro catheter was advanced into the unnamed ascending colonic branch artery. Contrast injection confirms rapid active bleeding into the ascending colon. Coil embolization was performed using a combination of 2 x 40 mm fibered and soft interlock detachable coils. Post embolization arteriogram demonstrates successful embolization. There is trace revascularization of the distal most aspect of the bleeding artery secondary to collateral per fusion but this is minimal and only visible during high-pressure injection. To assess for other treatable sources of collateral supply, the micro catheter was brought back into the superior mesenteric artery and advanced into the right colic artery. Arteriography was  performed. No evidence of active extravasation from any of the distal branches of the right colic artery. The catheters were removed. A limited right common femoral arteriogram was performed confirming common femoral arterial access. Hemostasis was attained with the assistance of a Cordis Exoseal extra arterial vascular plug. IMPRESSION: 1. Successful placement of a right IJ approach triple-lumen central venous catheter. The catheter tip is at the cavoatrial junction and ready for use. 2. Angiography demonstrates active brisk bleeding from a branch of the marginal artery in the proximal to mid ascending colon. 3. Successful coil embolization of the actively bleeding artery. Signed, Sterling Big, MD [facility] Radiology Electronically Signed   By: Malachy Moan M.D.   On: 09/12/2016 12:28   Ir Angiogram Selective Each Additional Vessel  Result Date: 09/12/2016 INDICATION: 82 year old female with large volume active lower GI bleed requiring transfusion. Nuclear medicine tagged red blood cell study localizes the bleed to the region of the cecum/ascending colon. Additionally, she has hypotension not responding to fluid resuscitation. She requires more blood products. We will place an emergent central line, administer more blood and perform visceral angiography with embolization if the bleeding source can be identified. EXAM: SELECTIVE VISCERAL ARTERIOGRAPHY; IR RIGHT FLOURO GUIDE CV LINE; IR ULTRASOUND GUIDANCE VASC ACCESS RIGHT; ADDITIONAL ARTERIOGRAPHY; IR EMBO ART VEN HEMORR LYMPH EXTRAV INC GUIDE ROADMAPPING 1. Ultrasound-guided access right internal jugular vein 2. Placement of a right IJ central venous catheter 3. Ultrasound-guided access right common femoral artery 4. Catheterization superior mesenteric artery with arteriogram 5. Catheterization middle colic artery with arteriogram 6. Catheterization right branch of the middle colic artery with  arteriogram 7. Catheterization marginal artery with arteriogram 8. Catheterization of  an un-named ascending colonic branch artery with arteriogram 9. Coil embolization of unnamed ascending colonic branch artery 10. Catheterization of the right colic artery with arteriogram Interventional Radiologist:  Sterling Big, MD MEDICATIONS: 1 unit packed red blood cells administered during the course of the procedure. ANESTHESIA/SEDATION: Fentanyl 0 mcg IV; Versed 3 mg IV Moderate Sedation Time:  59 minutes The patient was continuously monitored during the procedure by the interventional radiology nurse under my direct supervision. CONTRAST:  25mL ISOVUE-300 IOPAMIDOL (ISOVUE-300) INJECTION 61%, 65mL ISOVUE-300 IOPAMIDOL (ISOVUE-300) INJECTION 61% FLUOROSCOPY TIME:  Fluoroscopy Time: 16 minutes 30 seconds (777 mGy). COMPLICATIONS: None immediate. PROCEDURE: Informed consent was obtained from the patient following explanation of the procedure, risks, benefits and alternatives. The patient understands, agrees and consents for the procedure. All questions were addressed. A time out was performed. Maximal barrier sterile technique utilized including caps, mask, sterile gowns, sterile gloves, large sterile drape, hand hygiene, and chlorhexidine skin prep. Attention was first turned to the right neck to establish central venous access. The right internal jugular vein was interrogated with ultrasound and found to be widely patent. An image was obtained and stored for the medical record. Local anesthesia was attained by infiltration with 1% lidocaine. A small dermatotomy was made. Under real-time sonographic guidance, the vessel was punctured with an 18 gauge needle. Using standard technique, the needle was exchanged over a 0.013 inch wire and the skin tract was dilated. A then, an Arrow triple-lumen catheter was advanced over the wire and position with the tip at the cavoatrial junction. The catheter flushed and aspirated  with ease. It was flushed, capped and secured to the skin with Prolene suture. A sterile bandage was applied. Attention was then turned to the right groin. The right common femoral artery was interrogated with ultrasound and found to be widely patent. An image was obtained and stored for the medical record. Local anesthesia was attained by infiltration with 1% lidocaine. A small dermatotomy was made. Under real-time sonographic guidance, the vessel was punctured with a 21 gauge micropuncture needle. Using standard technique, the initial micro needle was exchanged over a 0.018 micro wire for a transitional 4 [place] micro sheath. The micro sheath was then exchanged over a 0.035 wire for a working 5 [place] vascular sheath. A C2 cobra catheter was then advanced over the Bentson wire into the abdominal aorta. The catheter was used to select the superior mesenteric artery. A superior mesenteric arteriogram was performed. There is obvious active extravasation occurring in the proximal to mid ascending colon from a visible branch artery arising from the marginal artery. A renegade STC micro catheter was then advanced over a Fathom wire and used to select the middle colic artery. An arteriogram was performed confirming the vascular anatomy. The micro catheter was then navigated into the right branch of the middle colic artery. Arteriogram was again performed confirming that this vessel is ultimately contiguous with the site of active bleeding. The micro catheter was then advanced into the marginal artery and arteriogram was performed. Finally, the micro catheter was advanced into the unnamed ascending colonic branch artery. Contrast injection confirms rapid active bleeding into the ascending colon. Coil embolization was performed using a combination of 2 x 40 mm fibered and soft interlock detachable coils. Post embolization arteriogram demonstrates successful embolization. There is trace revascularization of the distal most  aspect of the bleeding artery secondary to collateral per fusion but this is minimal and only visible during high-pressure injection. To assess for other treatable sources  of collateral supply, the micro catheter was brought back into the superior mesenteric artery and advanced into the right colic artery. Arteriography was performed. No evidence of active extravasation from any of the distal branches of the right colic artery. The catheters were removed. A limited right common femoral arteriogram was performed confirming common femoral arterial access. Hemostasis was attained with the assistance of a Cordis Exoseal extra arterial vascular plug. IMPRESSION: 1. Successful placement of a right IJ approach triple-lumen central venous catheter. The catheter tip is at the cavoatrial junction and ready for use. 2. Angiography demonstrates active brisk bleeding from a branch of the marginal artery in the proximal to mid ascending colon. 3. Successful coil embolization of the actively bleeding artery. Signed, Sterling Big, MD [facility] Radiology Electronically Signed   By: Malachy Moan M.D.   On: 09/12/2016 12:28   Ir Fluoro Guide Cv Line Right  Result Date: 09/12/2016 INDICATION: 82 year old female with large volume active lower GI bleed requiring transfusion. Nuclear medicine tagged red blood cell study localizes the bleed to the region of the cecum/ascending colon. Additionally, she has hypotension not responding to fluid resuscitation. She requires more blood products. We will place an emergent central line, administer more blood and perform visceral angiography with embolization if the bleeding source can be identified. EXAM: SELECTIVE VISCERAL ARTERIOGRAPHY; IR RIGHT FLOURO GUIDE CV LINE; IR ULTRASOUND GUIDANCE VASC ACCESS RIGHT; ADDITIONAL ARTERIOGRAPHY; IR EMBO ART VEN HEMORR LYMPH EXTRAV INC GUIDE ROADMAPPING 1. Ultrasound-guided access right  internal jugular vein 2. Placement of a right IJ central venous catheter 3. Ultrasound-guided access right common femoral artery 4. Catheterization superior mesenteric artery with arteriogram 5. Catheterization middle colic artery with arteriogram 6. Catheterization right branch of the middle colic artery with arteriogram 7. Catheterization marginal artery with arteriogram 8. Catheterization of an un-named ascending colonic branch artery with arteriogram 9. Coil embolization of unnamed ascending colonic branch artery 10. Catheterization of the right colic artery with arteriogram Interventional Radiologist:  Sterling Big, MD MEDICATIONS: 1 unit packed red blood cells administered during the course of the procedure. ANESTHESIA/SEDATION: Fentanyl 0 mcg IV; Versed 3 mg IV Moderate Sedation Time:  59 minutes The patient was continuously monitored during the procedure by the interventional radiology nurse under my direct supervision. CONTRAST:  25mL ISOVUE-300 IOPAMIDOL (ISOVUE-300) INJECTION 61%, 65mL ISOVUE-300 IOPAMIDOL (ISOVUE-300) INJECTION 61% FLUOROSCOPY TIME:  Fluoroscopy Time: 16 minutes 30 seconds (777 mGy). COMPLICATIONS: None immediate. PROCEDURE: Informed consent was obtained from the patient following explanation of the procedure, risks, benefits and alternatives. The patient understands, agrees and consents for the procedure. All questions were addressed. A time out was performed. Maximal barrier sterile technique utilized including caps, mask, sterile gowns, sterile gloves, large sterile drape, hand hygiene, and chlorhexidine skin prep. Attention was first turned to the right neck to establish central venous access. The right internal jugular vein was interrogated with ultrasound and found to be widely patent. An image was obtained and stored for the medical record. Local anesthesia was attained by infiltration with 1% lidocaine. A small dermatotomy was made. Under real-time sonographic guidance,  the vessel was punctured with an 18 gauge needle. Using standard technique, the needle was exchanged over a 0.013 inch wire and the skin tract was dilated. A then, an Arrow triple-lumen catheter was advanced over the wire and position with the tip at the cavoatrial junction. The catheter flushed and aspirated with ease. It was flushed, capped and secured to the skin with Prolene  suture. A sterile bandage was applied. Attention was then turned to the right groin. The right common femoral artery was interrogated with ultrasound and found to be widely patent. An image was obtained and stored for the medical record. Local anesthesia was attained by infiltration with 1% lidocaine. A small dermatotomy was made. Under real-time sonographic guidance, the vessel was punctured with a 21 gauge micropuncture needle. Using standard technique, the initial micro needle was exchanged over a 0.018 micro wire for a transitional 4 [place] micro sheath. The micro sheath was then exchanged over a 0.035 wire for a working 5 [place] vascular sheath. A C2 cobra catheter was then advanced over the Bentson wire into the abdominal aorta. The catheter was used to select the superior mesenteric artery. A superior mesenteric arteriogram was performed. There is obvious active extravasation occurring in the proximal to mid ascending colon from a visible branch artery arising from the marginal artery. A renegade STC micro catheter was then advanced over a Fathom wire and used to select the middle colic artery. An arteriogram was performed confirming the vascular anatomy. The micro catheter was then navigated into the right branch of the middle colic artery. Arteriogram was again performed confirming that this vessel is ultimately contiguous with the site of active bleeding. The micro catheter was then advanced into the marginal artery and arteriogram was performed. Finally, the micro catheter was advanced into the unnamed ascending colonic branch  artery. Contrast injection confirms rapid active bleeding into the ascending colon. Coil embolization was performed using a combination of 2 x 40 mm fibered and soft interlock detachable coils. Post embolization arteriogram demonstrates successful embolization. There is trace revascularization of the distal most aspect of the bleeding artery secondary to collateral per fusion but this is minimal and only visible during high-pressure injection. To assess for other treatable sources of collateral supply, the micro catheter was brought back into the superior mesenteric artery and advanced into the right colic artery. Arteriography was performed. No evidence of active extravasation from any of the distal branches of the right colic artery. The catheters were removed. A limited right common femoral arteriogram was performed confirming common femoral arterial access. Hemostasis was attained with the assistance of a Cordis Exoseal extra arterial vascular plug. IMPRESSION: 1. Successful placement of a right IJ approach triple-lumen central venous catheter. The catheter tip is at the cavoatrial junction and ready for use. 2. Angiography demonstrates active brisk bleeding from a branch of the marginal artery in the proximal to mid ascending colon. 3. Successful coil embolization of the actively bleeding artery. Signed, Sterling Big, MD [facility] Radiology Electronically Signed   By: Malachy Moan M.D.   On: 09/12/2016 12:28   Ir US Guide Vasc Access Right  Result Date: 09/12/2016 INDICATION: 82 year old female with large volume active lower GI bleed requiring transfusion. Nuclear medicine tagged red blood cell study localizes the bleed to the region of the cecum/ascending colon. Additionally, she has hypotension not responding to fluid resuscitation. She requires more blood products. We will place an emergent central line, administer more blood and perform  visceral angiography with embolization if the bleeding source can be identified. EXAM: SELECTIVE VISCERAL ARTERIOGRAPHY; IR RIGHT FLOURO GUIDE CV LINE; IR ULTRASOUND GUIDANCE VASC ACCESS RIGHT; ADDITIONAL ARTERIOGRAPHY; IR EMBO ART VEN HEMORR LYMPH EXTRAV INC GUIDE ROADMAPPING 1. Ultrasound-guided access right internal jugular vein 2. Placement of a right IJ central venous catheter 3. Ultrasound-guided access right common femoral artery 4. Catheterization superior mesenteric  artery with arteriogram 5. Catheterization middle colic artery with arteriogram 6. Catheterization right branch of the middle colic artery with arteriogram 7. Catheterization marginal artery with arteriogram 8. Catheterization of an un-named ascending colonic branch artery with arteriogram 9. Coil embolization of unnamed ascending colonic branch artery 10. Catheterization of the right colic artery with arteriogram Interventional Radiologist:  Sterling Big, MD MEDICATIONS: 1 unit packed red blood cells administered during the course of the procedure. ANESTHESIA/SEDATION: Fentanyl 0 mcg IV; Versed 3 mg IV Moderate Sedation Time:  59 minutes The patient was continuously monitored during the procedure by the interventional radiology nurse under my direct supervision. CONTRAST:  25mL ISOVUE-300 IOPAMIDOL (ISOVUE-300) INJECTION 61%, 65mL ISOVUE-300 IOPAMIDOL (ISOVUE-300) INJECTION 61% FLUOROSCOPY TIME:  Fluoroscopy Time: 16 minutes 30 seconds (777 mGy). COMPLICATIONS: None immediate. PROCEDURE: Informed consent was obtained from the patient following explanation of the procedure, risks, benefits and alternatives. The patient understands, agrees and consents for the procedure. All questions were addressed. A time out was performed. Maximal barrier sterile technique utilized including caps, mask, sterile gowns, sterile gloves, large sterile drape, hand hygiene, and chlorhexidine skin prep. Attention was first turned to the right neck to establish  central venous access. The right internal jugular vein was interrogated with ultrasound and found to be widely patent. An image was obtained and stored for the medical record. Local anesthesia was attained by infiltration with 1% lidocaine. A small dermatotomy was made. Under real-time sonographic guidance, the vessel was punctured with an 18 gauge needle. Using standard technique, the needle was exchanged over a 0.013 inch wire and the skin tract was dilated. A then, an Arrow triple-lumen catheter was advanced over the wire and position with the tip at the cavoatrial junction. The catheter flushed and aspirated with ease. It was flushed, capped and secured to the skin with Prolene suture. A sterile bandage was applied. Attention was then turned to the right groin. The right common femoral artery was interrogated with ultrasound and found to be widely patent. An image was obtained and stored for the medical record. Local anesthesia was attained by infiltration with 1% lidocaine. A small dermatotomy was made. Under real-time sonographic guidance, the vessel was punctured with a 21 gauge micropuncture needle. Using standard technique, the initial micro needle was exchanged over a 0.018 micro wire for a transitional 4 [place] micro sheath. The micro sheath was then exchanged over a 0.035 wire for a working 5 [place] vascular sheath. A C2 cobra catheter was then advanced over the Bentson wire into the abdominal aorta. The catheter was used to select the superior mesenteric artery. A superior mesenteric arteriogram was performed. There is obvious active extravasation occurring in the proximal to mid ascending colon from a visible branch artery arising from the marginal artery. A renegade STC micro catheter was then advanced over a Fathom wire and used to select the middle colic artery. An arteriogram was performed confirming the vascular anatomy. The micro catheter was then navigated into the right branch of the middle  colic artery. Arteriogram was again performed confirming that this vessel is ultimately contiguous with the site of active bleeding. The micro catheter was then advanced into the marginal artery and arteriogram was performed. Finally, the micro catheter was advanced into the unnamed ascending colonic branch artery. Contrast injection confirms rapid active bleeding into the ascending colon. Coil embolization was performed using a combination of 2 x 40 mm fibered and soft interlock detachable coils. Post embolization arteriogram demonstrates successful embolization. There is trace revascularization  of the distal most aspect of the bleeding artery secondary to collateral per fusion but this is minimal and only visible during high-pressure injection. To assess for other treatable sources of collateral supply, the micro catheter was brought back into the superior mesenteric artery and advanced into the right colic artery. Arteriography was performed. No evidence of active extravasation from any of the distal branches of the right colic artery. The catheters were removed. A limited right common femoral arteriogram was performed confirming common femoral arterial access. Hemostasis was attained with the assistance of a Cordis Exoseal extra arterial vascular plug. IMPRESSION: 1. Successful placement of a right IJ approach triple-lumen central venous catheter. The catheter tip is at the cavoatrial junction and ready for use. 2. Angiography demonstrates active brisk bleeding from a branch of the marginal artery in the proximal to mid ascending colon. 3. Successful coil embolization of the actively bleeding artery. Signed, Sterling Big, MD [facility] Radiology Electronically Signed   By: Malachy Moan M.D.   On: 09/12/2016 12:28   Ir US Guide Vasc Access Right  Result Date: 09/12/2016 INDICATION: 82 year old female with large volume active lower GI bleed  requiring transfusion. Nuclear medicine tagged red blood cell study localizes the bleed to the region of the cecum/ascending colon. Additionally, she has hypotension not responding to fluid resuscitation. She requires more blood products. We will place an emergent central line, administer more blood and perform visceral angiography with embolization if the bleeding source can be identified. EXAM: SELECTIVE VISCERAL ARTERIOGRAPHY; IR RIGHT FLOURO GUIDE CV LINE; IR ULTRASOUND GUIDANCE VASC ACCESS RIGHT; ADDITIONAL ARTERIOGRAPHY; IR EMBO ART VEN HEMORR LYMPH EXTRAV INC GUIDE ROADMAPPING 1. Ultrasound-guided access right internal jugular vein 2. Placement of a right IJ central venous catheter 3. Ultrasound-guided access right common femoral artery 4. Catheterization superior mesenteric artery with arteriogram 5. Catheterization middle colic artery with arteriogram 6. Catheterization right branch of the middle colic artery with arteriogram 7. Catheterization marginal artery with arteriogram 8. Catheterization of an un-named ascending colonic branch artery with arteriogram 9. Coil embolization of unnamed ascending colonic branch artery 10. Catheterization of the right colic artery with arteriogram Interventional Radiologist:  Sterling Big, MD MEDICATIONS: 1 unit packed red blood cells administered during the course of the procedure. ANESTHESIA/SEDATION: Fentanyl 0 mcg IV; Versed 3 mg IV Moderate Sedation Time:  59 minutes The patient was continuously monitored during the procedure by the interventional radiology nurse under my direct supervision. CONTRAST:  25mL ISOVUE-300 IOPAMIDOL (ISOVUE-300) INJECTION 61%, 65mL ISOVUE-300 IOPAMIDOL (ISOVUE-300) INJECTION 61% FLUOROSCOPY TIME:  Fluoroscopy Time: 16 minutes 30 seconds (777 mGy). COMPLICATIONS: None immediate. PROCEDURE: Informed consent was obtained from the patient following explanation of the procedure, risks, benefits and alternatives. The patient understands,  agrees and consents for the procedure. All questions were addressed. A time out was performed. Maximal barrier sterile technique utilized including caps, mask, sterile gowns, sterile gloves, large sterile drape, hand hygiene, and chlorhexidine skin prep. Attention was first turned to the right neck to establish central venous access. The right internal jugular vein was interrogated with ultrasound and found to be widely patent. An image was obtained and stored for the medical record. Local anesthesia was attained by infiltration with 1% lidocaine. A small dermatotomy was made. Under real-time sonographic guidance, the vessel was punctured with an 18 gauge needle. Using standard technique, the needle was exchanged over a 0.013 inch wire and the skin tract was dilated. A then, an Arrow triple-lumen catheter was advanced  over the wire and position with the tip at the cavoatrial junction. The catheter flushed and aspirated with ease. It was flushed, capped and secured to the skin with Prolene suture. A sterile bandage was applied. Attention was then turned to the right groin. The right common femoral artery was interrogated with ultrasound and found to be widely patent. An image was obtained and stored for the medical record. Local anesthesia was attained by infiltration with 1% lidocaine. A small dermatotomy was made. Under real-time sonographic guidance, the vessel was punctured with a 21 gauge micropuncture needle. Using standard technique, the initial micro needle was exchanged over a 0.018 micro wire for a transitional 4 [place] micro sheath. The micro sheath was then exchanged over a 0.035 wire for a working 5 [place] vascular sheath. A C2 cobra catheter was then advanced over the Bentson wire into the abdominal aorta. The catheter was used to select the superior mesenteric artery. A superior mesenteric arteriogram was performed. There is obvious active extravasation occurring in the proximal to mid ascending colon  from a visible branch artery arising from the marginal artery. A renegade STC micro catheter was then advanced over a Fathom wire and used to select the middle colic artery. An arteriogram was performed confirming the vascular anatomy. The micro catheter was then navigated into the right branch of the middle colic artery. Arteriogram was again performed confirming that this vessel is ultimately contiguous with the site of active bleeding. The micro catheter was then advanced into the marginal artery and arteriogram was performed. Finally, the micro catheter was advanced into the unnamed ascending colonic branch artery. Contrast injection confirms rapid active bleeding into the ascending colon. Coil embolization was performed using a combination of 2 x 40 mm fibered and soft interlock detachable coils. Post embolization arteriogram demonstrates successful embolization. There is trace revascularization of the distal most aspect of the bleeding artery secondary to collateral per fusion but this is minimal and only visible during high-pressure injection. To assess for other treatable sources of collateral supply, the micro catheter was brought back into the superior mesenteric artery and advanced into the right colic artery. Arteriography was performed. No evidence of active extravasation from any of the distal branches of the right colic artery. The catheters were removed. A limited right common femoral arteriogram was performed confirming common femoral arterial access. Hemostasis was attained with the assistance of a Cordis Exoseal extra arterial vascular plug. IMPRESSION: 1. Successful placement of a right IJ approach triple-lumen central venous catheter. The catheter tip is at the cavoatrial junction and ready for use. 2. Angiography demonstrates active brisk bleeding from a branch of the marginal artery in the proximal to mid ascending colon. 3. Successful coil embolization of the actively bleeding artery. Signed,  Sterling Big, MD [facility] Radiology Electronically Signed   By: Malachy Moan M.D.   On: 09/12/2016 12:28   Dg Chest Portable 1 View  Result Date: 09/12/2016 CLINICAL DATA:  Rectal bleeding since last night. Weakness. Shortness of breath. EXAM: PORTABLE CHEST 1 VIEW COMPARISON:  08/06/2015 FINDINGS: Unchanged mediastinal contours with thoracic aortic atherosclerosis. Chronic appearing increased interstitial opacities, suggesting emphysema. Calcified granuloma in the right midlung zone. No focal airspace disease, evidence of pulmonary edema, pleural effusion or pneumothorax. The bones are under mineralized. IMPRESSION: Chronic interstitial thickening.  No evidence of acute abnormality. Thoracic aortic atherosclerosis. Electronically Signed   By: Rubye Oaks M.D.   On: 09/12/2016 02:28   Ir Shana Chute Art  [facility]orr [facility] Scripts Guide Roadmapping  Result Date: 09/12/2016 INDICATION: 82 year old female with large volume active lower GI bleed requiring transfusion. Nuclear medicine tagged red blood cell study localizes the bleed to the region of the cecum/ascending colon. Additionally, she has hypotension not responding to fluid resuscitation. She requires more blood products. We will place an emergent central line, administer more blood and perform visceral angiography with embolization if the bleeding source can be identified. EXAM: SELECTIVE VISCERAL ARTERIOGRAPHY; IR RIGHT FLOURO GUIDE CV LINE; IR ULTRASOUND GUIDANCE VASC ACCESS RIGHT; ADDITIONAL ARTERIOGRAPHY; IR EMBO ART VEN HEMORR LYMPH EXTRAV INC GUIDE ROADMAPPING 1. Ultrasound-guided access right internal jugular vein 2. Placement of a right IJ central venous catheter 3. Ultrasound-guided access right common femoral artery 4. Catheterization superior mesenteric artery with arteriogram 5. Catheterization middle colic artery with arteriogram 6. Catheterization right branch of the middle  colic artery with arteriogram 7. Catheterization marginal artery with arteriogram 8. Catheterization of an un-named ascending colonic branch artery with arteriogram 9. Coil embolization of unnamed ascending colonic branch artery 10. Catheterization of the right colic artery with arteriogram Interventional Radiologist:  Sterling Big, MD MEDICATIONS: 1 unit packed red blood cells administered during the course of the procedure. ANESTHESIA/SEDATION: Fentanyl 0 mcg IV; Versed 3 mg IV Moderate Sedation Time:  59 minutes The patient was continuously monitored during the procedure by the interventional radiology nurse under my direct supervision. CONTRAST:  25mL ISOVUE-300 IOPAMIDOL (ISOVUE-300) INJECTION 61%, 65mL ISOVUE-300 IOPAMIDOL (ISOVUE-300) INJECTION 61% FLUOROSCOPY TIME:  Fluoroscopy Time: 16 minutes 30 seconds (777 mGy). COMPLICATIONS: None immediate. PROCEDURE: Informed consent was obtained from the patient following explanation of the procedure, risks, benefits and alternatives. The patient understands, agrees and consents for the procedure. All questions were addressed. A time out was performed. Maximal barrier sterile technique utilized including caps, mask, sterile gowns, sterile gloves, large sterile drape, hand hygiene, and chlorhexidine skin prep. Attention was first turned to the right neck to establish central venous access. The right internal jugular vein was interrogated with ultrasound and found to be widely patent. An image was obtained and stored for the medical record. Local anesthesia was attained by infiltration with 1% lidocaine. A small dermatotomy was made. Under real-time sonographic guidance, the vessel was punctured with an 18 gauge needle. Using standard technique, the needle was exchanged over a 0.013 inch wire and the skin tract was dilated. A then, an Arrow triple-lumen catheter was advanced over the wire and position with the tip at the cavoatrial junction. The catheter flushed  and aspirated with ease. It was flushed, capped and secured to the skin with Prolene suture. A sterile bandage was applied. Attention was then turned to the right groin. The right common femoral artery was interrogated with ultrasound and found to be widely patent. An image was obtained and stored for the medical record. Local anesthesia was attained by infiltration with 1% lidocaine. A small dermatotomy was made. Under real-time sonographic guidance, the vessel was punctured with a 21 gauge micropuncture needle. Using standard technique, the initial micro needle was exchanged over a 0.018 micro wire for a transitional 4 [place] micro sheath. The micro sheath was then exchanged over a 0.035 wire for a working 5 [place] vascular sheath. A C2 cobra catheter was then advanced over the Bentson wire into the abdominal aorta. The catheter was used to select the superior mesenteric artery. A superior mesenteric arteriogram was performed. There is obvious active extravasation occurring in the proximal to mid ascending colon from a  visible branch artery arising from the marginal artery. A renegade STC micro catheter was then advanced over a Fathom wire and used to select the middle colic artery. An arteriogram was performed confirming the vascular anatomy. The micro catheter was then navigated into the right branch of the middle colic artery. Arteriogram was again performed confirming that this vessel is ultimately contiguous with the site of active bleeding. The micro catheter was then advanced into the marginal artery and arteriogram was performed. Finally, the micro catheter was advanced into the unnamed ascending colonic branch artery. Contrast injection confirms rapid active bleeding into the ascending colon. Coil embolization was performed using a combination of 2 x 40 mm fibered and soft interlock detachable coils. Post embolization arteriogram demonstrates successful embolization. There is trace revascularization of  the distal most aspect of the bleeding artery secondary to collateral per fusion but this is minimal and only visible during high-pressure injection. To assess for other treatable sources of collateral supply, the micro catheter was brought back into the superior mesenteric artery and advanced into the right colic artery. Arteriography was performed. No evidence of active extravasation from any of the distal branches of the right colic artery. The catheters were removed. A limited right common femoral arteriogram was performed confirming common femoral arterial access. Hemostasis was attained with the assistance of a Cordis Exoseal extra arterial vascular plug. IMPRESSION: 1. Successful placement of a right IJ approach triple-lumen central venous catheter. The catheter tip is at the cavoatrial junction and ready for use. 2. Angiography demonstrates active brisk bleeding from a branch of the marginal artery in the proximal to mid ascending colon. 3. Successful coil embolization of the actively bleeding artery. Signed, Sterling Big, MD [facility] Radiology Electronically Signed   By: Malachy Moan M.D.   On: 09/12/2016 12:28   Subjective: Feeling better today.  Voiding frequently.  Would like IVF to stop.  Denies abdominal pain.  Having some maroon streaks mixed in with brown stool but no bright red blood.    Discharge Exam: Vitals:   09/15/16 1440 09/15/16 1441  BP: (!) 134/42 (!) 147/58  Pulse: 74   Resp: 20   Temp: 98.7 F (37.1 C)    Vitals:   09/14/16 2342 09/15/16 0510 09/15/16 1440 09/15/16 1441  BP:  124/66 (!) 134/42 (!) 147/58  Pulse:  73 74   Resp:  16 20   Temp:  98.4 F (36.9 C) 98.7 F (37.1 C)   TempSrc:  Oral Oral   SpO2: 97% 100% 100%   Weight:      Height:        General: Pt is alert, awake, not in acute distress Cardiovascular: RRR, 3/6 systolic murmur and S1 and S2 are indistinct.  No rubs, no  gallops Respiratory: CTA bilaterally, no wheezing, no rhonchi Abdominal: Soft, NT, ND, bowel sounds + Extremities: no edema, no cyanosis    The results of significant diagnostics from this hospitalization (including imaging, microbiology, ancillary and laboratory) are listed below for reference.     Microbiology: Recent Results (from the past 240 hour(s))  MRSA PCR Screening     Status: None   Collection Time: 09/12/16  4:37 AM  Result Value Ref Range Status   MRSA by PCR NEGATIVE NEGATIVE Final    Comment:        The GeneXpert MRSA Assay (FDA approved for NASAL specimens only), is one component of a comprehensive MRSA colonization surveillance program. It is not intended  to diagnose MRSA infection nor to guide or monitor treatment for MRSA infections.      Labs: BNP (last 3 results) No results for input(s): BNP in the last 8760 hours. Basic Metabolic Panel:  Recent Labs Lab 09/12/16 0137 09/12/16 0200 09/12/16 1610 09/13/16 1115 09/14/16 0340  NA 138 138 144 140 141  K 4.3 4.3 4.1 4.0 3.9  CL 112* 111 127* 124* 124*  CO2 20*  --  10* 14* 18*  GLUCOSE 197* 191* 182* 106* 78  BUN 34* 35* 26* 26* 18  CREATININE 1.24* 1.30* 1.17* 1.31* 1.07*  CALCIUM 9.0  --  6.5* 7.2* 7.4*   Liver Function Tests:  Recent Labs Lab 09/12/16 0137 09/12/16 1610  AST 21 22  ALT 9* 12*  ALKPHOS 85 42  BILITOT 0.3 0.5  PROT 6.0* 3.5*  ALBUMIN 3.6 2.0*   No results for input(s): LIPASE, AMYLASE in the last 168 hours. No results for input(s): AMMONIA in the last 168 hours. CBC:  Recent Labs Lab 09/12/16 0137  09/12/16 1610  09/14/16 0340 09/14/16 1120 09/14/16 1948 09/15/16 0425 09/15/16 1150  WBC 8.3  --  22.8*  --  10.1  --   --  10.1  --   NEUTROABS 5.7  --  20.8*  --   --   --   --   --   --   HGB 9.0*  < > 8.5*  < > 7.6* 8.0* 7.8* 8.0* 8.5*  HCT 27.7*  < > 24.7*  < > 21.8* 23.0* 22.3* 23.4* 24.7*  MCV 95.2  --  89.5  --  87.9  --   --  89.0  --   PLT 157  --   49*  --  38*  --   --  57*  --   < > = values in this interval not displayed. Cardiac Enzymes:  Recent Labs Lab 09/12/16 1610 09/12/16 1955 09/13/16 0320  TROPONINI 0.06* 0.07* 0.07*   BNP: Invalid input(s): POCBNP CBG: No results for input(s): GLUCAP in the last 168 hours. D-Dimer No results for input(s): DDIMER in the last 72 hours. Hgb A1c No results for input(s): HGBA1C in the last 72 hours. Lipid Profile No results for input(s): CHOL, HDL, LDLCALC, TRIG, CHOLHDL, LDLDIRECT in the last 72 hours. Thyroid function studies No results for input(s): TSH, T4TOTAL, T3FREE, THYROIDAB in the last 72 hours.  Invalid input(s): FREET3 Anemia work up No results for input(s): VITAMINB12, FOLATE, FERRITIN, TIBC, IRON, RETICCTPCT in the last 72 hours. Urinalysis    Component Value Date/Time   COLORURINE YELLOW 08/18/2015 1109   APPEARANCEUR CLEAR 08/18/2015 1109   LABSPEC 1.010 08/18/2015 1109   PHURINE 7.0 08/18/2015 1109   GLUCOSEU NEGATIVE 08/18/2015 1109   GLUCOSEU NEGATIVE 01/10/2015 1611   HGBUR NEGATIVE 08/18/2015 1109   BILIRUBINUR NEGATIVE 08/18/2015 1109   KETONESUR NEGATIVE 08/18/2015 1109   PROTEINUR NEGATIVE 08/18/2015 1109   UROBILINOGEN 0.2 08/18/2015 1109   NITRITE NEGATIVE 08/18/2015 1109   LEUKOCYTESUR NEGATIVE 08/18/2015 1109   Sepsis Labs Invalid input(s): PROCALCITONIN,  WBC,  LACTICIDVEN   Time coordinating discharge: Over 30 minutes  SIGNED:   Renae FickleSHORT, Tunisha Ruland, MD  [facility] 09/15/2016, 4:46 PM Pager   If 7PM-7AM, please contact night-coverage [email or web address] Password [email or web address]

## 2016-09-17 DIAGNOSIS — Z87891 Personal history of nicotine dependence: Secondary | ICD-10-CM | POA: Diagnosis not present

## 2016-09-17 DIAGNOSIS — Z7982 Long term (current) use of aspirin: Secondary | ICD-10-CM | POA: Diagnosis not present

## 2016-09-17 DIAGNOSIS — K922 Gastrointestinal hemorrhage, unspecified: Secondary | ICD-10-CM | POA: Diagnosis not present

## 2016-09-17 DIAGNOSIS — I130 Hypertensive heart and chronic kidney disease with heart failure and stage 1 through stage 4 chronic kidney disease, or unspecified chronic kidney disease: Secondary | ICD-10-CM | POA: Diagnosis not present

## 2016-09-17 DIAGNOSIS — J449 Chronic obstructive pulmonary disease, unspecified: Secondary | ICD-10-CM | POA: Diagnosis not present

## 2016-09-17 DIAGNOSIS — I5032 Chronic diastolic (congestive) heart failure: Secondary | ICD-10-CM | POA: Diagnosis not present

## 2016-09-17 DIAGNOSIS — N183 Chronic kidney disease, stage 3 (moderate): Secondary | ICD-10-CM | POA: Diagnosis not present

## 2016-09-17 DIAGNOSIS — M6281 Muscle weakness (generalized): Secondary | ICD-10-CM | POA: Diagnosis not present

## 2016-09-20 DIAGNOSIS — H353131 Nonexudative age-related macular degeneration, bilateral, early dry stage: Secondary | ICD-10-CM | POA: Diagnosis not present

## 2016-09-20 DIAGNOSIS — H524 Presbyopia: Secondary | ICD-10-CM | POA: Diagnosis not present

## 2016-09-20 DIAGNOSIS — Z961 Presence of intraocular lens: Secondary | ICD-10-CM | POA: Diagnosis not present

## 2016-09-21 DIAGNOSIS — I5032 Chronic diastolic (congestive) heart failure: Secondary | ICD-10-CM | POA: Diagnosis not present

## 2016-09-21 DIAGNOSIS — I130 Hypertensive heart and chronic kidney disease with heart failure and stage 1 through stage 4 chronic kidney disease, or unspecified chronic kidney disease: Secondary | ICD-10-CM | POA: Diagnosis not present

## 2016-09-21 DIAGNOSIS — K922 Gastrointestinal hemorrhage, unspecified: Secondary | ICD-10-CM | POA: Diagnosis not present

## 2016-09-21 DIAGNOSIS — M6281 Muscle weakness (generalized): Secondary | ICD-10-CM | POA: Diagnosis not present

## 2016-09-21 DIAGNOSIS — J449 Chronic obstructive pulmonary disease, unspecified: Secondary | ICD-10-CM | POA: Diagnosis not present

## 2016-09-21 DIAGNOSIS — N183 Chronic kidney disease, stage 3 (moderate): Secondary | ICD-10-CM | POA: Diagnosis not present

## 2016-09-22 DIAGNOSIS — I130 Hypertensive heart and chronic kidney disease with heart failure and stage 1 through stage 4 chronic kidney disease, or unspecified chronic kidney disease: Secondary | ICD-10-CM | POA: Diagnosis not present

## 2016-09-22 DIAGNOSIS — M6281 Muscle weakness (generalized): Secondary | ICD-10-CM | POA: Diagnosis not present

## 2016-09-22 DIAGNOSIS — I5032 Chronic diastolic (congestive) heart failure: Secondary | ICD-10-CM | POA: Diagnosis not present

## 2016-09-22 DIAGNOSIS — J449 Chronic obstructive pulmonary disease, unspecified: Secondary | ICD-10-CM | POA: Diagnosis not present

## 2016-09-22 DIAGNOSIS — N183 Chronic kidney disease, stage 3 (moderate): Secondary | ICD-10-CM | POA: Diagnosis not present

## 2016-09-22 DIAGNOSIS — K922 Gastrointestinal hemorrhage, unspecified: Secondary | ICD-10-CM | POA: Diagnosis not present

## 2016-09-24 DIAGNOSIS — J449 Chronic obstructive pulmonary disease, unspecified: Secondary | ICD-10-CM | POA: Diagnosis not present

## 2016-09-24 DIAGNOSIS — M6281 Muscle weakness (generalized): Secondary | ICD-10-CM | POA: Diagnosis not present

## 2016-09-24 DIAGNOSIS — I130 Hypertensive heart and chronic kidney disease with heart failure and stage 1 through stage 4 chronic kidney disease, or unspecified chronic kidney disease: Secondary | ICD-10-CM | POA: Diagnosis not present

## 2016-09-24 DIAGNOSIS — N183 Chronic kidney disease, stage 3 (moderate): Secondary | ICD-10-CM | POA: Diagnosis not present

## 2016-09-24 DIAGNOSIS — K922 Gastrointestinal hemorrhage, unspecified: Secondary | ICD-10-CM | POA: Diagnosis not present

## 2016-09-24 DIAGNOSIS — I5032 Chronic diastolic (congestive) heart failure: Secondary | ICD-10-CM | POA: Diagnosis not present

## 2016-09-27 DIAGNOSIS — J449 Chronic obstructive pulmonary disease, unspecified: Secondary | ICD-10-CM | POA: Diagnosis not present

## 2016-09-27 DIAGNOSIS — I5032 Chronic diastolic (congestive) heart failure: Secondary | ICD-10-CM | POA: Diagnosis not present

## 2016-09-27 DIAGNOSIS — K922 Gastrointestinal hemorrhage, unspecified: Secondary | ICD-10-CM | POA: Diagnosis not present

## 2016-09-27 DIAGNOSIS — N183 Chronic kidney disease, stage 3 (moderate): Secondary | ICD-10-CM | POA: Diagnosis not present

## 2016-09-27 DIAGNOSIS — I130 Hypertensive heart and chronic kidney disease with heart failure and stage 1 through stage 4 chronic kidney disease, or unspecified chronic kidney disease: Secondary | ICD-10-CM | POA: Diagnosis not present

## 2016-09-27 DIAGNOSIS — M6281 Muscle weakness (generalized): Secondary | ICD-10-CM | POA: Diagnosis not present

## 2016-09-28 ENCOUNTER — Encounter: Payer: Self-pay | Admitting: Nurse Practitioner

## 2016-09-28 ENCOUNTER — Ambulatory Visit (INDEPENDENT_AMBULATORY_CARE_PROVIDER_SITE_OTHER): Payer: Medicare Other | Admitting: Nurse Practitioner

## 2016-09-28 VITALS — BP 128/54 | HR 74 | Ht 60.0 in | Wt 103.4 lb

## 2016-09-28 DIAGNOSIS — I7092 Chronic total occlusion of artery of the extremities: Secondary | ICD-10-CM

## 2016-09-28 DIAGNOSIS — K5909 Other constipation: Secondary | ICD-10-CM | POA: Diagnosis not present

## 2016-09-28 DIAGNOSIS — D62 Acute posthemorrhagic anemia: Secondary | ICD-10-CM

## 2016-09-28 MED ORDER — POLYETHYLENE GLYCOL 3350 17 GM/SCOOP PO POWD: 17.0000 g | Freq: Every day | ORAL | 2 refills | Status: DC

## 2016-09-28 NOTE — Patient Instructions (Addendum)
Continue the Miralax 1-2 x daily, titrate.   We sent refills to [facility] [address]   Come to our lab basement level on 10-15-2016 Friday  To have blood drawn.  We will call you with results.

## 2016-09-28 NOTE — Progress Notes (Signed)
HPI: This is a  12 year with multiple medical problems not limited to COPD, hypertension, chronic diastolic heart failure. is accompanied by her granddaughter today. She is here for a hospital follow up. Patient was hosptialized several days ago with a painless hematochezia / hypotensive shock requiring pressors and several units of blood. She was evaluated by [place] ( Dr. Adela Lank), tagged RBC scan ordered and positive at cecum / ascending colon. She underwent coil embolization of actively bleeding artery in proximal to mid-ascending colon. No bleeding since. No abdominal pain.   Patient has chronic constipation, much more problematic since increasing iron to twice daily upon hospital discharge . MiraLAX was recently increased to twice a day and constipation has improved.    Past Medical History:  Diagnosis Date  . Allergic rhinitis   . Anemia   . Aortic valve disease    regurge > stenosis  . Chest pain, non-cardiac   . Chronic diastolic heart failure (HCC)   . COPD (chronic obstructive pulmonary disease) (HCC)   . Cough   . Gastritis   . History of atherosclerotic cardiovascular disease   . History of small bowel obstruction   . Hypertension   . Left ventricular hypertrophy   . Obesity   . Personal history of tobacco use, presenting hazards to health     Past Surgical History:  Procedure Laterality Date  . adenoasine cardiolite  11/09/2004  . APPENDECTOMY    . BOWEL RESECTION N/A 08/10/2015   Procedure: SMALL BOWEL OBSTRUCTION;  Surgeon: Glenna Fellows, MD;  Location: [facility];  Service: General;  Laterality: N/A;  . ESOPHAGOGASTRODUODENOSCOPY  11/09/1999  . HERNIA REPAIR    . IR GENERIC HISTORICAL  09/12/2016   IR FLUORO GUIDE CV LINE RIGHT 09/12/2016 Malachy Moan, MD [facility]  . IR GENERIC HISTORICAL  09/12/2016   IR US GUIDE VASC ACCESS RIGHT 09/12/2016 Malachy Moan, MD [facility]  . IR GENERIC HISTORICAL  09/12/2016   IR ANGIOGRAM SELECTIVE EACH ADDITIONAL  VESSEL 09/12/2016 Malachy Moan, MD [facility]  . IR GENERIC HISTORICAL  09/12/2016   IR ANGIOGRAM VISCERAL SELECTIVE 09/12/2016 Malachy Moan, MD [facility]  . IR GENERIC HISTORICAL  09/12/2016   IR ANGIOGRAM SELECTIVE EACH ADDITIONAL VESSEL 09/12/2016 Malachy Moan, MD [facility]  . IR GENERIC HISTORICAL  09/12/2016   IR ANGIOGRAM SELECTIVE EACH ADDITIONAL VESSEL 09/12/2016 Malachy Moan, MD [facility]  . IR GENERIC HISTORICAL  09/12/2016   IR US GUIDE VASC ACCESS RIGHT 09/12/2016 Malachy Moan, MD [facility]  . IR GENERIC HISTORICAL  09/12/2016   IR ANGIOGRAM SELECTIVE EACH ADDITIONAL VESSEL 09/12/2016 Malachy Moan, MD [facility]  . IR GENERIC HISTORICAL  09/12/2016   IR EMBO ART  VEN HEMORR LYMPH EXTRAV  INC GUIDE ROADMAPPING 09/12/2016 Malachy Moan, MD [facility]  . IR GENERIC HISTORICAL  09/12/2016   IR ANGIOGRAM SELECTIVE EACH ADDITIONAL VESSEL 09/12/2016 Malachy Moan, MD [facility]  . LAPAROTOMY  08/10/2015   Procedure: EXPLORATORY LAPAROTOMY;  Surgeon: Glenna Fellows, MD;  Location: [facility];  Service: General;;  . LYSIS OF ADHESION  08/10/2015   Procedure: LYSIS OF ADHESION;  Surgeon: Glenna Fellows, MD;  Location: [facility];  Service: General;;  . small bowel obstruction  06/1996  . transthoracic cardiolite   11/03/2006    Current Outpatient Prescriptions  Medication Sig Dispense Refill  . acetaminophen (TYLENOL) 325 MG tablet Take 1-2 tablets (325-650 mg total) by mouth every 6 (six) hours as needed for fever, headache, mild pain  or moderate pain.    Marland Kitchen. aspirin EC 81 MG tablet Take 81 mg by mouth daily.    . feeding supplement (BOOST / RESOURCE BREEZE) LIQD Take 1 Container by mouth 3 (three) times daily between meals. 90 Container 11  . ferrous sulfate 325 (65 FE) MG EC tablet Take 1 tablet (325 mg total) by mouth 2 (two) times daily. 60 tablet 0  . Incontinence Supply Disposable (REALITY INCONTINENT BRIEFS SM) MISC 10 per day  R32 300 each 11   . polyethylene glycol powder (GLYCOLAX/MIRALAX) powder Take 17 g by mouth daily. (Patient taking differently: Take 17 g by mouth daily as needed (for constipation). ) 850 g 0   No current facility-administered medications for this visit.     Allergies as of 09/28/2016  . (No Known Allergies)    History reviewed. No pertinent family history.  Social History   Social History  . Marital status: Widowed    Spouse name: N/A  . Number of children: N/A  . Years of education: N/A   Occupational History  . Not on file.   Social History Main Topics  . Smoking status: Former Smoker    Packs/day: 0.10    Years: 30.00    Types: Cigarettes    Quit date: 01/24/2010  . Smokeless tobacco: Never Used     Comment: smoked 1-2 cigarettes/day  . Alcohol use No  . Drug use: No  . Sexual activity: Not Currently    Physical Exam: BP (!) 128/54   Pulse 74   Ht 5' (1.524 m)   Wt 103 lb 6 oz (46.9 kg)   BMI 20.19 kg/m  Constitutional: pleasant thin black female in NAD. HEENT: Normocephalic, conjunctiva pink, mucous membranes moist, neck supple without masses  Cardiac: RRR, no murmurs heard, no lower extremity edema Pulmonary: Lungs CTA bilaterally Abdomen: soft, nontender, nondistended, no obvious ascites, no masses felt. normal bowel sounds Skin: excessively dry. No rashes    ASSESSMENT and PLAN:  Recent lower GI bleed / hypotensive shock requiring pressors. She is s/p coil embolization of actively bleeding branch of marginal artery in the proximal to mid-ascending colon on 09/12/16. No further bleeding. No abdominal pain.   Acute on chronic anemia secondary to recent GI bleed. Baseline hgb upper 10s, she recently bled down to 6.7. After 4 unit of blood hgb rose to 8.5 on 09/15/16. Was on daily iron, this was increased to BID at time of discharge. - repeat CBC in a month, if hgb significantly improved will consider going back to once daily iron.    Chronic constipation, worse lately with  increase in iron to BID. Her miraalx was increased to BID and bowels moving better. Hopefully we can get iron back to once daily dosing soon

## 2016-09-29 DIAGNOSIS — I130 Hypertensive heart and chronic kidney disease with heart failure and stage 1 through stage 4 chronic kidney disease, or unspecified chronic kidney disease: Secondary | ICD-10-CM | POA: Diagnosis not present

## 2016-09-29 DIAGNOSIS — K922 Gastrointestinal hemorrhage, unspecified: Secondary | ICD-10-CM | POA: Diagnosis not present

## 2016-09-29 DIAGNOSIS — M6281 Muscle weakness (generalized): Secondary | ICD-10-CM | POA: Diagnosis not present

## 2016-09-29 DIAGNOSIS — I5032 Chronic diastolic (congestive) heart failure: Secondary | ICD-10-CM | POA: Diagnosis not present

## 2016-09-29 DIAGNOSIS — J449 Chronic obstructive pulmonary disease, unspecified: Secondary | ICD-10-CM | POA: Diagnosis not present

## 2016-09-29 DIAGNOSIS — N183 Chronic kidney disease, stage 3 (moderate): Secondary | ICD-10-CM | POA: Diagnosis not present

## 2016-09-30 ENCOUNTER — Encounter: Payer: Self-pay | Admitting: Endocrinology

## 2016-09-30 ENCOUNTER — Ambulatory Visit (INDEPENDENT_AMBULATORY_CARE_PROVIDER_SITE_OTHER): Payer: Medicare Other | Admitting: Endocrinology

## 2016-09-30 VITALS — BP 140/60 | HR 74 | Ht 60.0 in | Wt 100.0 lb

## 2016-09-30 DIAGNOSIS — D696 Thrombocytopenia, unspecified: Secondary | ICD-10-CM | POA: Diagnosis not present

## 2016-09-30 DIAGNOSIS — I7092 Chronic total occlusion of artery of the extremities: Secondary | ICD-10-CM

## 2016-09-30 DIAGNOSIS — Z23 Encounter for immunization: Secondary | ICD-10-CM

## 2016-09-30 DIAGNOSIS — N3001 Acute cystitis with hematuria: Secondary | ICD-10-CM

## 2016-09-30 DIAGNOSIS — N390 Urinary tract infection, site not specified: Secondary | ICD-10-CM | POA: Insufficient documentation

## 2016-09-30 DIAGNOSIS — N289 Disorder of kidney and ureter, unspecified: Secondary | ICD-10-CM | POA: Diagnosis not present

## 2016-09-30 DIAGNOSIS — K922 Gastrointestinal hemorrhage, unspecified: Secondary | ICD-10-CM

## 2016-09-30 LAB — CBC WITH DIFFERENTIAL/PLATELET
Basophils Absolute: 0 10*3/uL (ref 0.0–0.1)
Basophils Relative: 0.5 % (ref 0.0–3.0)
Eosinophils Absolute: 0.1 10*3/uL (ref 0.0–0.7)
Eosinophils Relative: 1.7 % (ref 0.0–5.0)
HCT: 31.7 % — ABNORMAL LOW (ref 36.0–46.0)
Hemoglobin: 10.6 g/dL — ABNORMAL LOW (ref 12.0–15.0)
Lymphocytes Relative: 27.7 % (ref 12.0–46.0)
Lymphs Abs: 1.6 10*3/uL (ref 0.7–4.0)
MCHC: 33.4 g/dL (ref 30.0–36.0)
MCV: 92.4 fl (ref 78.0–100.0)
Monocytes Absolute: 0.4 10*3/uL (ref 0.1–1.0)
Monocytes Relative: 6.7 % (ref 3.0–12.0)
Neutro Abs: 3.7 10*3/uL (ref 1.4–7.7)
Neutrophils Relative %: 63.4 % (ref 43.0–77.0)
Platelets: 237 10*3/uL (ref 150.0–400.0)
RBC: 3.43 Mil/uL — ABNORMAL LOW (ref 3.87–5.11)
RDW: 18.2 % — ABNORMAL HIGH (ref 11.5–15.5)
WBC: 5.8 10*3/uL (ref 4.0–10.5)

## 2016-09-30 LAB — BASIC METABOLIC PANEL WITH GFR
BUN: 17 mg/dL (ref 6–23)
CO2: 27 meq/L (ref 19–32)
Calcium: 8.9 mg/dL (ref 8.4–10.5)
Chloride: 106 meq/L (ref 96–112)
Creatinine, Ser: 0.84 mg/dL (ref 0.40–1.20)
GFR: 81.09 mL/min
Glucose, Bld: 91 mg/dL (ref 70–99)
Potassium: 3.9 meq/L (ref 3.5–5.1)
Sodium: 139 meq/L (ref 135–145)

## 2016-09-30 LAB — URINALYSIS, ROUTINE W REFLEX MICROSCOPIC
Bilirubin Urine: NEGATIVE
Ketones, ur: NEGATIVE
Nitrite: POSITIVE — AB
Specific Gravity, Urine: 1.015
Total Protein, Urine: NEGATIVE
Urine Glucose: NEGATIVE
Urobilinogen, UA: 1
pH: 6 (ref 5.0–8.0)

## 2016-09-30 LAB — VITAMIN D 25 HYDROXY (VIT D DEFICIENCY, FRACTURES): VITD: 12.6 ng/mL — ABNORMAL LOW (ref 30.00–100.00)

## 2016-09-30 LAB — IBC PANEL
Iron: 44 ug/dL (ref 42–145)
Saturation Ratios: 13.7 % — ABNORMAL LOW (ref 20.0–50.0)
Transferrin: 229 mg/dL (ref 212.0–360.0)

## 2016-09-30 MED ORDER — AMLODIPINE BESYLATE 5 MG PO TABS: 2.5000 mg | ORAL_TABLET | Freq: Every day | ORAL | 3 refills | Status: DC

## 2016-09-30 MED ORDER — VITAMIN D (ERGOCALCIFEROL) 1.25 MG (50000 UNIT) PO CAPS: 50000.0000 [IU] | ORAL_CAPSULE | ORAL | 0 refills | Status: DC

## 2016-09-30 MED ORDER — DICLOFENAC SODIUM 1 % TD GEL: 2.0000 g | Freq: Four times a day (QID) | TRANSDERMAL | 2 refills | Status: DC

## 2016-09-30 MED ORDER — CEFUROXIME AXETIL 500 MG PO TABS: 500.0000 mg | ORAL_TABLET | Freq: Two times a day (BID) | ORAL | 0 refills | Status: DC

## 2016-09-30 NOTE — Progress Notes (Signed)
Agree with assessment and plan as outlined. She had a presumed right sided diverticular bleed with massive hemorrhage and associated shock, treated per IR with embolization and she has done well without recurrence.

## 2016-09-30 NOTE — Progress Notes (Signed)
Subjective:    Patient ID: Desiree Hutchinson, female    DOB: Aug 15, 1920, 82 y.o.   MRN: [id number]  HPI The state of at least three ongoing medical problems is addressed today, with interval history of each noted here:  She was recently in the hospital for LGIB.  She takes fe 1-BID.  Denies BRBPR.  She says her appetite is good Renal insuff: she has frequenu urination and slight dysuria.   HTN: meds were stopped in the hospital.   Past Medical History:  Diagnosis Date  . Allergic rhinitis   . Anemia   . Aortic valve disease    regurge > stenosis  . Chest pain, non-cardiac   . Chronic diastolic heart failure (HCC)   . COPD (chronic obstructive pulmonary disease) (HCC)   . Cough   . Gastritis   . History of atherosclerotic cardiovascular disease   . History of small bowel obstruction   . Hypertension   . Left ventricular hypertrophy   . Obesity   . Personal history of tobacco use, presenting hazards to health     Past Surgical History:  Procedure Laterality Date  . adenoasine cardiolite  11/09/2004  . APPENDECTOMY    . BOWEL RESECTION N/A 08/10/2015   Procedure: SMALL BOWEL OBSTRUCTION;  Surgeon: Glenna FellowsBenjamin Hoxworth, MD;  Location: [facility];  Service: General;  Laterality: N/A;  . ESOPHAGOGASTRODUODENOSCOPY  11/09/1999  . HERNIA REPAIR    . IR GENERIC HISTORICAL  09/12/2016   IR FLUORO GUIDE CV LINE RIGHT 09/12/2016 Malachy MoanHeath McCullough, MD [facility]  . IR GENERIC HISTORICAL  09/12/2016   IR US GUIDE VASC ACCESS RIGHT 09/12/2016 Malachy MoanHeath McCullough, MD [facility]  . IR GENERIC HISTORICAL  09/12/2016   IR ANGIOGRAM SELECTIVE EACH ADDITIONAL VESSEL 09/12/2016 Malachy MoanHeath McCullough, MD [facility]  . IR GENERIC HISTORICAL  09/12/2016   IR ANGIOGRAM VISCERAL SELECTIVE 09/12/2016 Malachy MoanHeath McCullough, MD [facility]  . IR GENERIC HISTORICAL  09/12/2016   IR ANGIOGRAM SELECTIVE EACH ADDITIONAL VESSEL 09/12/2016 Malachy MoanHeath McCullough, MD [facility]  . IR GENERIC HISTORICAL  09/12/2016   IR ANGIOGRAM  SELECTIVE EACH ADDITIONAL VESSEL 09/12/2016 Malachy MoanHeath McCullough, MD [facility]  . IR GENERIC HISTORICAL  09/12/2016   IR US GUIDE VASC ACCESS RIGHT 09/12/2016 Malachy MoanHeath McCullough, MD [facility]  . IR GENERIC HISTORICAL  09/12/2016   IR ANGIOGRAM SELECTIVE EACH ADDITIONAL VESSEL 09/12/2016 Malachy MoanHeath McCullough, MD [facility]  . IR GENERIC HISTORICAL  09/12/2016   IR EMBO ART  VEN HEMORR LYMPH EXTRAV  INC GUIDE ROADMAPPING 09/12/2016 Malachy MoanHeath McCullough, MD [facility]  . IR GENERIC HISTORICAL  09/12/2016   IR ANGIOGRAM SELECTIVE EACH ADDITIONAL VESSEL 09/12/2016 Malachy MoanHeath McCullough, MD [facility]  . LAPAROTOMY  08/10/2015   Procedure: EXPLORATORY LAPAROTOMY;  Surgeon: Glenna FellowsBenjamin Hoxworth, MD;  Location: [facility];  Service: General;;  . LYSIS OF ADHESION  08/10/2015   Procedure: LYSIS OF ADHESION;  Surgeon: Glenna FellowsBenjamin Hoxworth, MD;  Location: [facility];  Service: General;;  . small bowel obstruction  06/1996  . transthoracic cardiolite   11/03/2006    Social History   Social History  . Marital status: Widowed    Spouse name: N/A  . Number of children: N/A  . Years of education: N/A   Occupational History  . Not on file.   Social History Main Topics  . Smoking status: Former Smoker    Packs/day: 0.10    Years: 30.00    Types: Cigarettes    Quit date: 01/24/2010  . Smokeless tobacco: Never  Used     Comment: smoked 1-2 cigarettes/day  . Alcohol use No  . Drug use: No  . Sexual activity: Not Currently   Other Topics Concern  . Not on file   Social History Narrative  . No narrative on file    Current Outpatient Prescriptions on File Prior to Visit  Medication Sig Dispense Refill  . acetaminophen (TYLENOL) 325 MG tablet Take 1-2 tablets (325-650 mg total) by mouth every 6 (six) hours as needed for fever, headache, mild pain or moderate pain.    Marland Kitchen aspirin EC 81 MG tablet Take 81 mg by mouth daily.    . feeding supplement (BOOST / RESOURCE BREEZE) LIQD Take 1 Container by mouth 3 (three) times daily  between meals. 90 Container 11  . ferrous sulfate 325 (65 FE) MG EC tablet Take 1 tablet (325 mg total) by mouth 2 (two) times daily. 60 tablet 0  . Incontinence Supply Disposable (REALITY INCONTINENT BRIEFS SM) MISC 10 per day  R32 300 each 11  . polyethylene glycol powder (GLYCOLAX/MIRALAX) powder Take 17 g by mouth daily. 500 g 2   No current facility-administered medications on file prior to visit.     No Known Allergies  No family history on file.  BP 140/60   Pulse 74   Ht 5' (1.524 m)   Wt 100 lb (45.4 kg)   SpO2 98%   BMI 19.53 kg/m    Review of Systems She has edema, but she denies hematuria.  She has right shoulder pain    Objective:   Physical Exam Vital signs: see vs page. Gen: elderly, frail, no distress. LUNGS:  Clear to auscultation.  HEART:  Regular rate and rhythm without murmurs noted. Normal S1,S2.  ABDOMEN: abdomen is soft, nontender.  no hepatosplenomegaly.  not distended.  no hernia.  Ext: 1+ bilat leg edema.   MSK: right shoulder: full ROM, but ROM is painful.    Lab Results  Component Value Date   WBC 5.8 09/30/2016   HGB 10.6 (L) 09/30/2016   HCT 31.7 (L) 09/30/2016   MCV 92.4 09/30/2016   PLT 237.0 09/30/2016   Lab Results  Component Value Date   CREATININE 0.84 09/30/2016   BUN 17 09/30/2016   NA 139 09/30/2016   K 3.9 09/30/2016   CL 106 09/30/2016   CO2 27 09/30/2016   Lab Results  Component Value Date   PTH 81 (H) 09/12/2015   CALCIUM 8.9 09/30/2016   CAION 1.10 (L) 09/12/2016   PHOS 3.6 08/18/2015   25-OH Vit-D=12    Assessment & Plan:  Vit-D deficiency: new.  I have sent a prescription to your pharmacy, for this. Anemia: improved: Please continue the same fe tabs HTN: she is ready to resume rx at low dosage.  Take norvasc 2.5/d UTI: new.  I have sent a prescription to your pharmacy, for an antibiotic.  Check c/s Shoulder pain, new.  I rx'ed voltaren gel.

## 2016-09-30 NOTE — Patient Instructions (Addendum)
blood and urine tests are requested for you today.  We'll let you know about the results.  Please resume the amlodipine, just 1/2 pill per day, for now.     I have sent a prescription to your pharmacy, for an anti-pain gel.   Please come back for a follow-up appointment in 1 month.

## 2016-10-01 DIAGNOSIS — J449 Chronic obstructive pulmonary disease, unspecified: Secondary | ICD-10-CM | POA: Diagnosis not present

## 2016-10-01 DIAGNOSIS — M6281 Muscle weakness (generalized): Secondary | ICD-10-CM | POA: Diagnosis not present

## 2016-10-01 DIAGNOSIS — N183 Chronic kidney disease, stage 3 (moderate): Secondary | ICD-10-CM | POA: Diagnosis not present

## 2016-10-01 DIAGNOSIS — K922 Gastrointestinal hemorrhage, unspecified: Secondary | ICD-10-CM | POA: Diagnosis not present

## 2016-10-01 DIAGNOSIS — I130 Hypertensive heart and chronic kidney disease with heart failure and stage 1 through stage 4 chronic kidney disease, or unspecified chronic kidney disease: Secondary | ICD-10-CM | POA: Diagnosis not present

## 2016-10-01 DIAGNOSIS — I5032 Chronic diastolic (congestive) heart failure: Secondary | ICD-10-CM | POA: Diagnosis not present

## 2016-10-04 DIAGNOSIS — I5032 Chronic diastolic (congestive) heart failure: Secondary | ICD-10-CM | POA: Diagnosis not present

## 2016-10-04 DIAGNOSIS — N183 Chronic kidney disease, stage 3 (moderate): Secondary | ICD-10-CM | POA: Diagnosis not present

## 2016-10-04 DIAGNOSIS — J449 Chronic obstructive pulmonary disease, unspecified: Secondary | ICD-10-CM | POA: Diagnosis not present

## 2016-10-04 DIAGNOSIS — I130 Hypertensive heart and chronic kidney disease with heart failure and stage 1 through stage 4 chronic kidney disease, or unspecified chronic kidney disease: Secondary | ICD-10-CM | POA: Diagnosis not present

## 2016-10-04 DIAGNOSIS — K922 Gastrointestinal hemorrhage, unspecified: Secondary | ICD-10-CM | POA: Diagnosis not present

## 2016-10-04 DIAGNOSIS — M6281 Muscle weakness (generalized): Secondary | ICD-10-CM | POA: Diagnosis not present

## 2016-10-04 LAB — PTH, INTACT AND CALCIUM
Calcium: 9.4 mg/dL (ref 8.6–10.4)
PTH: 142 pg/mL — ABNORMAL HIGH (ref 14–64)

## 2016-10-23 ENCOUNTER — Other Ambulatory Visit: Payer: Self-pay | Admitting: Endocrinology

## 2016-11-02 ENCOUNTER — Other Ambulatory Visit: Payer: Self-pay | Admitting: Endocrinology

## 2016-11-05 ENCOUNTER — Other Ambulatory Visit: Payer: Self-pay | Admitting: Endocrinology

## 2016-11-15 ENCOUNTER — Other Ambulatory Visit (INDEPENDENT_AMBULATORY_CARE_PROVIDER_SITE_OTHER): Payer: Medicare Other

## 2016-11-15 DIAGNOSIS — K5909 Other constipation: Secondary | ICD-10-CM | POA: Diagnosis not present

## 2016-11-15 DIAGNOSIS — D62 Acute posthemorrhagic anemia: Secondary | ICD-10-CM | POA: Diagnosis not present

## 2016-11-15 LAB — CBC WITH DIFFERENTIAL/PLATELET
Basophils Absolute: 0 10*3/uL (ref 0.0–0.1)
Basophils Relative: 0.4 % (ref 0.0–3.0)
Eosinophils Absolute: 0.1 10*3/uL (ref 0.0–0.7)
Eosinophils Relative: 1.2 % (ref 0.0–5.0)
HCT: 39 % (ref 36.0–46.0)
Hemoglobin: 12.8 g/dL (ref 12.0–15.0)
Lymphocytes Relative: 32.6 % (ref 12.0–46.0)
Lymphs Abs: 1.7 10*3/uL (ref 0.7–4.0)
MCHC: 32.9 g/dL (ref 30.0–36.0)
MCV: 93 fl (ref 78.0–100.0)
Monocytes Absolute: 0.3 10*3/uL (ref 0.1–1.0)
Monocytes Relative: 6 % (ref 3.0–12.0)
Neutro Abs: 3.2 10*3/uL (ref 1.4–7.7)
Neutrophils Relative %: 59.8 % (ref 43.0–77.0)
Platelets: 166 10*3/uL (ref 150.0–400.0)
RBC: 4.2 Mil/uL (ref 3.87–5.11)
RDW: 16.3 % — ABNORMAL HIGH (ref 11.5–15.5)
WBC: 5.4 10*3/uL (ref 4.0–10.5)

## 2016-11-22 ENCOUNTER — Encounter: Payer: Self-pay | Admitting: Pulmonary Disease

## 2016-11-22 ENCOUNTER — Ambulatory Visit (INDEPENDENT_AMBULATORY_CARE_PROVIDER_SITE_OTHER): Payer: Medicare Other | Admitting: Pulmonary Disease

## 2016-11-22 VITALS — BP 116/68 | HR 78 | Ht 60.0 in | Wt 97.4 lb

## 2016-11-22 DIAGNOSIS — J439 Emphysema, unspecified: Secondary | ICD-10-CM | POA: Diagnosis not present

## 2016-11-22 DIAGNOSIS — J432 Centrilobular emphysema: Secondary | ICD-10-CM | POA: Diagnosis not present

## 2016-11-22 DIAGNOSIS — I7092 Chronic total occlusion of artery of the extremities: Secondary | ICD-10-CM | POA: Diagnosis not present

## 2016-11-22 NOTE — Progress Notes (Signed)
Subjective:    Patient ID: Desiree Hutchinson, female    DOB: August 28, 1920, 82 y.o.   MRN: [id number]  HPI COPD Gold class B  Desiree Hutchinson is a 82 year old with mild emphysema. Gold class B. She is a former patient of Dr. Delford FieldWright. She's been maintained on Spiriva with improvement in symptoms. She does not have any recent exacerbations or ER visits for resp issues. She was hospitalized in July 2016 with an acute diverticular bleed. She was transfused but did not end up getting a colonoscopy. Her bleeding resolved spontaneously. She was also hospitalized in August 2016 with a small bowel obstruction. She this was initially managed conservatively however she had to undergo surgery with lysis of adhesions.   Interim history: Hospitalized in sept 2017 for lower GIB. She did not have any issues with her breathing during this hospitalizations.  DATA: Spirometry 01/08/14 FVC 2.08 [119%] FEV1 1.06 [89%] F/F 51 Mild   Chest x-ray 08/06/15 Mild bibasilar atelectasis, mild vascular congestion.  Social History:  She is a former smoker. She quit in 2011. No alcohol or drug use.                                                                                                                                                                                                                                                         Family History: Non contributory  Past Medical History:  Diagnosis Date  . Allergic rhinitis   . Anemia   . Aortic valve disease    regurge > stenosis  . Chest pain, non-cardiac   . Chronic diastolic heart failure (HCC)   . COPD (chronic obstructive pulmonary disease) (HCC)   . Cough   . Gastritis   . History of atherosclerotic cardiovascular disease   . History of small bowel obstruction   . Hypertension   . Left ventricular hypertrophy   . Obesity   . Personal history of tobacco use, presenting hazards to health     Current Outpatient Prescriptions:  .  amLODipine  (NORVASC) 5 MG tablet, Take 0.5 tablets (2.5 mg total) by mouth daily., Disp: 15 tablet, Rfl: 3 .  aspirin EC 81 MG tablet, Take 81 mg by mouth daily., Disp: , Rfl:  .  feeding supplement (BOOST / RESOURCE BREEZE) LIQD, Take 1 Container by  mouth 3 (three) times daily between meals., Disp: 90 Container, Rfl: 11 .  ferrous sulfate 325 (65 FE) MG tablet, TAKE 1 TABLET BY MOUTH DAILY WITH BREAKFAST., Disp: 30 tablet, Rfl: 3 .  Incontinence Supply Disposable (REALITY INCONTINENT BRIEFS SM) MISC, 10 per day  R32, Disp: 300 each, Rfl: 11 .  polyethylene glycol powder (GLYCOLAX/MIRALAX) powder, Take 17 g by mouth daily., Disp: 500 g, Rfl: 2 .  Tiotropium Bromide Monohydrate (SPIRIVA RESPIMAT) 2.5 MCG/ACT AERS, Inhale 2 puffs into the lungs daily., Disp: , Rfl:   Review of Systems Has dyspnea on exertion at baseline. Denies any cough, sputum production, fevers, chills, hemoptysis. Denies any chest pain, palpitations. Denies any abdominal pain, nausea, vomiting, diarrhea. Denies any fevers, chills, loss of weight, loss of appetite. All other review of systems are negative    Objective:   Physical Exam Blood pressure 144/78, pulse 77, height 5' (1.524 m), weight 99 lb (44.906 kg), SpO2 100 %. Gen.: Pleasant elderly female. No apparent distress Neuro: No gross focal deficits. Neck: No JVD, lymphadenopathy, thyromegaly. RS: Clear, no wheeze, crackles, non-labored breathing. CVS: S1-S2 heard, no murmurs rubs gallops. Abdomen: Soft, positive bowel sounds. Extremities: No edema.    Assessment & Plan:  COPD Gold class B Stable on Spiriva. No exacerbations or ER visits for respiratory problems. She'll continue using the same as directed.  Return to clinic in 6 months.  Chilton GreathousePraveen Izaan Kingbird MD [facility] Pager [phone] If no answer or after 3pm call: [phone] 11/22/2016, 9:25 AM

## 2016-11-22 NOTE — Patient Instructions (Signed)
Continue using the Spiriva. Return to clinic in 6 months.

## 2016-12-28 ENCOUNTER — Telehealth: Payer: Self-pay | Admitting: Internal Medicine

## 2016-12-28 NOTE — Telephone Encounter (Signed)
Per last CBC result note pt is to have repeat CBC in 6 weeks which will be next week. Per pt request she wanted us to contact a ms. [name] bethea regarding her needing to come for labs. Left message for ms. Bethea to call back #[phone].

## 2016-12-29 NOTE — Telephone Encounter (Signed)
Spoke with ms. Bethea and she is aware of pt needing labs next week. Order in epic.

## 2017-01-03 ENCOUNTER — Other Ambulatory Visit (INDEPENDENT_AMBULATORY_CARE_PROVIDER_SITE_OTHER): Payer: Medicare Other

## 2017-01-03 DIAGNOSIS — D62 Acute posthemorrhagic anemia: Secondary | ICD-10-CM

## 2017-01-03 LAB — CBC WITH DIFFERENTIAL/PLATELET
Basophils Absolute: 0 10*3/uL (ref 0.0–0.1)
Basophils Relative: 0.7 % (ref 0.0–3.0)
Eosinophils Absolute: 0.1 10*3/uL (ref 0.0–0.7)
Eosinophils Relative: 2 % (ref 0.0–5.0)
HCT: 37 % (ref 36.0–46.0)
Hemoglobin: 12.6 g/dL (ref 12.0–15.0)
Lymphocytes Relative: 45.7 % (ref 12.0–46.0)
Lymphs Abs: 2 10*3/uL (ref 0.7–4.0)
MCHC: 33.9 g/dL (ref 30.0–36.0)
MCV: 91.6 fl (ref 78.0–100.0)
Monocytes Absolute: 0.3 10*3/uL (ref 0.1–1.0)
Monocytes Relative: 6.2 % (ref 3.0–12.0)
Neutro Abs: 2 10*3/uL (ref 1.4–7.7)
Neutrophils Relative %: 45.4 % (ref 43.0–77.0)
Platelets: 166 10*3/uL (ref 150.0–400.0)
RBC: 4.04 Mil/uL (ref 3.87–5.11)
RDW: 16.2 % — ABNORMAL HIGH (ref 11.5–15.5)
WBC: 4.4 10*3/uL (ref 4.0–10.5)

## 2017-01-20 ENCOUNTER — Other Ambulatory Visit: Payer: Self-pay | Admitting: Endocrinology

## 2017-03-02 ENCOUNTER — Other Ambulatory Visit: Payer: Self-pay | Admitting: Cardiovascular Disease

## 2017-03-04 ENCOUNTER — Other Ambulatory Visit: Payer: Self-pay | Admitting: Endocrinology

## 2017-03-08 ENCOUNTER — Other Ambulatory Visit: Payer: Self-pay

## 2017-03-08 MED ORDER — AMLODIPINE BESYLATE 5 MG PO TABS: 2.5000 mg | ORAL_TABLET | Freq: Every day | ORAL | 1 refills | Status: DC

## 2017-03-11 ENCOUNTER — Other Ambulatory Visit: Payer: Self-pay

## 2017-03-11 MED ORDER — TIOTROPIUM BROMIDE MONOHYDRATE 2.5 MCG/ACT IN AERS: 2.0000 | INHALATION_SPRAY | Freq: Every day | RESPIRATORY_TRACT | 1 refills | Status: DC

## 2017-03-14 ENCOUNTER — Other Ambulatory Visit: Payer: Self-pay

## 2017-03-14 MED ORDER — TIOTROPIUM BROMIDE MONOHYDRATE 2.5 MCG/ACT IN AERS: 2.0000 | INHALATION_SPRAY | Freq: Every day | RESPIRATORY_TRACT | 1 refills | Status: DC

## 2017-03-18 ENCOUNTER — Ambulatory Visit (INDEPENDENT_AMBULATORY_CARE_PROVIDER_SITE_OTHER): Payer: Medicare Other | Admitting: Cardiovascular Disease

## 2017-03-18 VITALS — BP 130/56 | HR 66 | Ht 60.0 in | Wt 94.0 lb

## 2017-03-18 DIAGNOSIS — I350 Nonrheumatic aortic (valve) stenosis: Secondary | ICD-10-CM

## 2017-03-18 DIAGNOSIS — I5032 Chronic diastolic (congestive) heart failure: Secondary | ICD-10-CM | POA: Diagnosis not present

## 2017-03-18 MED ORDER — AMLODIPINE BESYLATE 2.5 MG PO TABS: 2.5000 mg | ORAL_TABLET | Freq: Every day | ORAL | 3 refills | Status: DC

## 2017-03-18 NOTE — Progress Notes (Signed)
Cardiology Office Note Date:  03/20/2017   ID:  Desiree Hutchinson, DOB Aug 26, 1920, MRN [id number]  PCP:  [name], MD  Cardiologist:  Tonny Bollman, MD    Chief Complaint  Patient presents with  . Aortic Valve Disorder     History of Present Illness: Desiree Hutchinson is a 83 y.o. female who presents for follow-up of moderate aortic stenosis, hypertension, and chronic diastolic heart failure. She has longstanding shortness of breath. Followed by pulmonary for mild COPD and takes spiriva. At the time of her last visit in 2016 she was noted to have moderate aortic stenosis without much progression from previous echo studies.   She is here with her daughter today. Remains limited by breathing but overall getting along remarkably well. Still active in her church. No orthopnea, PND, or edema. No chest pain. Short of breath with low-level activity but denies progressive symptoms.    Past Medical History:  Diagnosis Date  . Allergic rhinitis   . Anemia   . Aortic valve disease    regurge > stenosis  . Chest pain, non-cardiac   . Chronic diastolic heart failure (HCC)   . COPD (chronic obstructive pulmonary disease) (HCC)   . Cough   . Gastritis   . History of atherosclerotic cardiovascular disease   . History of small bowel obstruction   . Hypertension   . Left ventricular hypertrophy   . Obesity   . Personal history of tobacco use, presenting hazards to health     Past Surgical History:  Procedure Laterality Date  . adenoasine cardiolite  11/09/2004  . APPENDECTOMY    . BOWEL RESECTION N/A 08/10/2015   Procedure: SMALL BOWEL OBSTRUCTION;  Surgeon: Glenna Fellows, MD;  Location: [facility];  Service: General;  Laterality: N/A;  . ESOPHAGOGASTRODUODENOSCOPY  11/09/1999  . HERNIA REPAIR    . IR GENERIC HISTORICAL  09/12/2016   IR FLUORO GUIDE CV LINE RIGHT 09/12/2016 Desiree Moan, MD [facility]  . IR GENERIC HISTORICAL  09/12/2016   IR US GUIDE VASC ACCESS RIGHT  09/12/2016 Desiree Moan, MD [facility]  . IR GENERIC HISTORICAL  09/12/2016   IR ANGIOGRAM SELECTIVE EACH ADDITIONAL VESSEL 09/12/2016 Desiree Moan, MD [facility]  . IR GENERIC HISTORICAL  09/12/2016   IR ANGIOGRAM VISCERAL SELECTIVE 09/12/2016 Desiree Moan, MD [facility]  . IR GENERIC HISTORICAL  09/12/2016   IR ANGIOGRAM SELECTIVE EACH ADDITIONAL VESSEL 09/12/2016 Desiree Moan, MD [facility]  . IR GENERIC HISTORICAL  09/12/2016   IR ANGIOGRAM SELECTIVE EACH ADDITIONAL VESSEL 09/12/2016 Desiree Moan, MD [facility]  . IR GENERIC HISTORICAL  09/12/2016   IR US GUIDE VASC ACCESS RIGHT 09/12/2016 Desiree Moan, MD [facility]  . IR GENERIC HISTORICAL  09/12/2016   IR ANGIOGRAM SELECTIVE EACH ADDITIONAL VESSEL 09/12/2016 Desiree Moan, MD [facility]  . IR GENERIC HISTORICAL  09/12/2016   IR EMBO ART  VEN HEMORR LYMPH EXTRAV  INC GUIDE ROADMAPPING 09/12/2016 Desiree Moan, MD [facility]  . IR GENERIC HISTORICAL  09/12/2016   IR ANGIOGRAM SELECTIVE EACH ADDITIONAL VESSEL 09/12/2016 Desiree Moan, MD [facility]  . LAPAROTOMY  08/10/2015   Procedure: EXPLORATORY LAPAROTOMY;  Surgeon: Glenna Fellows, MD;  Location: [facility];  Service: General;;  . LYSIS OF ADHESION  08/10/2015   Procedure: LYSIS OF ADHESION;  Surgeon: Glenna Fellows, MD;  Location: [facility];  Service: General;;  . small bowel obstruction  06/1996  . transthoracic cardiolite   11/03/2006    Current Outpatient Prescriptions  Medication  Sig Dispense Refill  . aspirin EC 81 MG tablet Take 81 mg by mouth daily.    . ferrous sulfate 325 (65 FE) MG tablet TAKE 1 TABLET BY MOUTH DAILY WITH BREAKFAST. 30 tablet 3  . Incontinence Supply Disposable (REALITY INCONTINENT BRIEFS SM) MISC 10 per day  R32 300 each 11  . polyethylene glycol powder (GLYCOLAX/MIRALAX) powder Take 17 g by mouth daily. 500 g 2  . Tiotropium Bromide Monohydrate (SPIRIVA RESPIMAT) 2.5 MCG/ACT AERS Inhale 2 puffs into the lungs  daily. 3 Inhaler 1  . amLODipine (NORVASC) 2.5 MG tablet Take 1 tablet (2.5 mg total) by mouth daily. 90 tablet 3   No current facility-administered medications for this visit.     Allergies:   Patient has no known allergies.   Social History:  The patient  reports that she quit smoking about 7 years ago. Her smoking use included Cigarettes. She has a 3.00 pack-year smoking history. She has never used smokeless tobacco. She reports that she does not drink alcohol or use drugs.   Family History:  The patient's  family history is not on file.    ROS:  Please see the history of present illness.  All other systems are reviewed and negative.    PHYSICAL EXAM: VS:  BP (!) 130/56   Pulse 66   Ht 5' (1.524 m)   Wt 94 lb (42.6 kg)   BMI 18.36 kg/m  , BMI Body mass index is 18.36 kg/m. GEN: Pleasant elderly woman in no acute distress  HEENT: normal  Neck: no JVD, no masses. bilateral carotid bruits Cardiac: RRR with 3/6 harsh late peaking systolic murmur at the LLSB               Respiratory:  clear to auscultation bilaterally, normal work of breathing GI: soft, nontender, nondistended, + BS MS: no deformity or atrophy  Ext: no pretibial edema, pedal pulses 2+= bilaterally Skin: warm and dry, no rash Neuro:  Strength and sensation are intact Psych: euthymic mood, full affect  EKG:  EKG is ordered today. The ekg ordered today shows NSR 65 bpm, LVH, nonspecific T wave abnormality  Recent Labs: 09/12/2016: ALT 12; TSH 2.855 09/30/2016: BUN 17; Creatinine, Ser 0.84; Potassium 3.9; Sodium 139 01/03/2017: Hemoglobin 12.6; Platelets 166.0   Lipid Panel     Component Value Date/Time   CHOL 198 01/10/2015 1611   TRIG 68 08/18/2015 0531   TRIG 103 10/27/2006 1030   HDL 88.60 01/10/2015 1611   CHOLHDL 2 01/10/2015 1611   VLDL 10.2 01/10/2015 1611   LDLCALC 99 01/10/2015 1611   LDLDIRECT 104.6 03/27/2013 1405      Wt Readings from Last 3 Encounters:  03/18/17 94 lb (42.6 kg)    11/22/16 97 lb 6.4 oz (44.2 kg)  09/30/16 100 lb (45.4 kg)     Cardiac Studies Reviewed: 2D Echo 09-12-2016: Study Conclusions  - Left ventricle: The cavity size was normal. There was severe   concentric hypertrophy. Systolic function was vigorous. The   estimated ejection fraction was in the range of 65% to 70%. Wall   motion was normal; there were no regional wall motion   abnormalities. Features are consistent with a pseudonormal left   ventricular filling pattern, with concomitant abnormal relaxation   and increased filling pressure (grade 2 diastolic dysfunction).   Doppler parameters are consistent with elevated ventricular   end-diastolic filling pressure. - Aortic valve: Trileaflet; severely thickened, severely calcified   leaflets. Valve mobility was restricted. There  was moderate to   severe stenosis. There was moderate regurgitation. Mean gradient   (S): 36 mm Hg. Peak gradient (S): 79 mm Hg. Valve area (VTI):   1.57 cm^2. Valve area (Vmax): 1.36 cm^2. Valve area (Vmean): 1.48   cm^2. - Mitral valve: Structurally normal valve. - Left atrium: The atrium was normal in size. - Right ventricle: Systolic function was normal. - Right atrium: The atrium was normal in size. - Tricuspid valve: There was no regurgitation. - Pulmonary arteries: Systolic pressure was within the normal   range. PA peak pressure: 35 mm Hg (S). - Inferior vena cava: The vessel was normal in size. - Pericardium, extracardiac: There was no pericardial effusion.  Impressions:  - When compared to the prior echo from 12/18/2014 aortic stenosis   is now moderate to severe, peak gradient 79 mmHg, mean gradient   36 mmHg, aortic regurgitation is at least moderate.   ASSESSMENT AND PLAN: 1.  Moderate-severe aortic stenosis: difficult to assess symptoms as she has longstanding dyspnea predating progressive aortic stenosis. She does have progressive AS based on most recent echo assessment based on peak  and mean gradients of 79 and 36 mmHg. However, the aortic valve dimensionless index is 0.48 (well above 0.25 criteria for severe aortic stenosis). She also has moderate AI, likely increasing flow and thus transaortic gradient. Taking all of this into account I think she likely has moderate aortic stenosis and at age 83 it seems ongoing observation is most appropriate. Will repeat another echo in 6 months and review with her in clinical follow-up at that time. We discussed the natural hx of aortic stenosis and treatment considerations in the context of her age and medical comorbidities.   2. HTN: BP well-controlled on current Rx.   3. Chronic dyspnea, multifactorial. Discussion as above.   4. Chronic diastolic CHF: appears stable, likely related to advanced age, HTN, and aortic stenosis. No evidence of volume excess on exam.  Current medicines are reviewed with the patient today.  The patient does not have concerns regarding medicines.  Labs/ tests ordered today include:   Orders Placed This Encounter  Procedures  . EKG 12-Lead  . ECHOCARDIOGRAM COMPLETE    Disposition:   FU 6 months with an echo prior to return office visit.   Enzo BiSigned, Jeanmarc Viernes, MD  03/20/2017 2:22 PM    [facility], [facility] [address], [place], [place]  [address] Phone: [phone]; Fax: [phone]

## 2017-03-18 NOTE — Patient Instructions (Signed)
Your physician recommends that you continue on your current medications as directed. Please refer to the Current Medication list given to you today. Your physician has requested that you have an echocardiogram. Echocardiography is a painless test that uses sound waves to create images of your heart. It provides your doctor with information about the size and shape of your heart and how well your heart's chambers and valves are working. This procedure takes approximately one hour. There are no restrictions for this procedure.   PLEASE SCHEDULE FOR IN 6 MONTHS.     Your physician wants you to follow-up in: 6 months with Dr. Excell Seltzerooper.  You will receive a reminder letter in the mail two months in advance. If you don't receive a letter, please call our office to schedule the follow-up appointment.

## 2017-03-20 ENCOUNTER — Encounter: Payer: Self-pay | Admitting: Cardiovascular Disease

## 2017-03-26 ENCOUNTER — Other Ambulatory Visit: Payer: Self-pay | Admitting: Cardiovascular Disease

## 2017-04-08 ENCOUNTER — Ambulatory Visit (HOSPITAL_COMMUNITY): Payer: Medicare Other | Attending: Cardiovascular Disease

## 2017-04-08 ENCOUNTER — Other Ambulatory Visit: Payer: Self-pay

## 2017-04-08 DIAGNOSIS — I350 Nonrheumatic aortic (valve) stenosis: Secondary | ICD-10-CM | POA: Diagnosis not present

## 2017-04-08 DIAGNOSIS — I5032 Chronic diastolic (congestive) heart failure: Secondary | ICD-10-CM

## 2017-06-20 ENCOUNTER — Encounter: Payer: Self-pay | Admitting: Endocrinology

## 2017-06-20 ENCOUNTER — Ambulatory Visit (INDEPENDENT_AMBULATORY_CARE_PROVIDER_SITE_OTHER): Payer: Medicare Other | Admitting: Endocrinology

## 2017-06-20 VITALS — BP 132/70 | HR 79 | Ht 60.0 in | Wt 93.0 lb

## 2017-06-20 DIAGNOSIS — N289 Disorder of kidney and ureter, unspecified: Secondary | ICD-10-CM | POA: Diagnosis not present

## 2017-06-20 DIAGNOSIS — Z780 Asymptomatic menopausal state: Secondary | ICD-10-CM | POA: Diagnosis not present

## 2017-06-20 DIAGNOSIS — K921 Melena: Secondary | ICD-10-CM | POA: Diagnosis not present

## 2017-06-20 DIAGNOSIS — Z0000 Encounter for general adult medical examination without abnormal findings: Secondary | ICD-10-CM | POA: Diagnosis not present

## 2017-06-20 LAB — CBC WITH DIFFERENTIAL/PLATELET
Basophils Absolute: 0 10*3/uL (ref 0.0–0.1)
Basophils Relative: 0.6 % (ref 0.0–3.0)
Eosinophils Absolute: 0.1 10*3/uL (ref 0.0–0.7)
Eosinophils Relative: 1.8 % (ref 0.0–5.0)
HCT: 38.9 % (ref 36.0–46.0)
Hemoglobin: 12.9 g/dL (ref 12.0–15.0)
Lymphocytes Relative: 50 % — ABNORMAL HIGH (ref 12.0–46.0)
Lymphs Abs: 2.3 10*3/uL (ref 0.7–4.0)
MCHC: 33.1 g/dL (ref 30.0–36.0)
MCV: 95.1 fl (ref 78.0–100.0)
Monocytes Absolute: 0.3 10*3/uL (ref 0.1–1.0)
Monocytes Relative: 6.4 % (ref 3.0–12.0)
Neutro Abs: 1.9 10*3/uL (ref 1.4–7.7)
Neutrophils Relative %: 41.2 % — ABNORMAL LOW (ref 43.0–77.0)
Platelets: 185 10*3/uL (ref 150.0–400.0)
RBC: 4.09 Mil/uL (ref 3.87–5.11)
RDW: 14.4 % (ref 11.5–15.5)
WBC: 4.6 10*3/uL (ref 4.0–10.5)

## 2017-06-20 LAB — BASIC METABOLIC PANEL
BUN: 22 mg/dL (ref 6–23)
CO2: 24 mEq/L (ref 19–32)
Calcium: 10 mg/dL (ref 8.4–10.5)
Chloride: 106 mEq/L (ref 96–112)
Creatinine, Ser: 0.95 mg/dL (ref 0.40–1.20)
GFR: 70.24 mL/min (ref >60.00)
Glucose, Bld: 77 mg/dL (ref 70–99)
Potassium: 3.9 mEq/L (ref 3.5–5.1)
Sodium: 138 mEq/L (ref 135–145)

## 2017-06-20 LAB — IBC PANEL
Iron: 74 ug/dL (ref 42–145)
Saturation Ratios: 23.2 % (ref 20.0–50.0)
Transferrin: 228 mg/dL (ref 212.0–360.0)

## 2017-06-20 LAB — VITAMIN D 25 HYDROXY (VIT D DEFICIENCY, FRACTURES): VITD: 23.77 ng/mL — ABNORMAL LOW (ref 30.00–100.00)

## 2017-06-20 NOTE — Progress Notes (Signed)
Subjective:    Patient ID: Desiree Hutchinson, female    DOB: April 16, 1920, 83 y.o.   MRN: [id number]  HPI Pt states few days of moderate hearing loss from both ears.  No assoc pain.   She did not take vit-D medication.  She takes fe 1/day.   Past Medical History:  Diagnosis Date  . Allergic rhinitis   . Anemia   . Aortic valve disease    regurge > stenosis  . Chest pain, non-cardiac   . Chronic diastolic heart failure (HCC)   . COPD (chronic obstructive pulmonary disease) (HCC)   . Cough   . Gastritis   . History of atherosclerotic cardiovascular disease   . History of small bowel obstruction   . Hypertension   . Left ventricular hypertrophy   . Obesity   . Personal history of tobacco use, presenting hazards to health     Past Surgical History:  Procedure Laterality Date  . adenoasine cardiolite  11/09/2004  . APPENDECTOMY    . BOWEL RESECTION N/A 08/10/2015   Procedure: SMALL BOWEL OBSTRUCTION;  Surgeon: Glenna Fellows, MD;  Location: [facility];  Service: General;  Laterality: N/A;  . ESOPHAGOGASTRODUODENOSCOPY  11/09/1999  . HERNIA REPAIR    . IR GENERIC HISTORICAL  09/12/2016   IR FLUORO GUIDE CV LINE RIGHT 09/12/2016 Malachy Moan, MD [facility]  . IR GENERIC HISTORICAL  09/12/2016   IR US GUIDE VASC ACCESS RIGHT 09/12/2016 Malachy Moan, MD [facility]  . IR GENERIC HISTORICAL  09/12/2016   IR ANGIOGRAM SELECTIVE EACH ADDITIONAL VESSEL 09/12/2016 Malachy Moan, MD [facility]  . IR GENERIC HISTORICAL  09/12/2016   IR ANGIOGRAM VISCERAL SELECTIVE 09/12/2016 Malachy Moan, MD [facility]  . IR GENERIC HISTORICAL  09/12/2016   IR ANGIOGRAM SELECTIVE EACH ADDITIONAL VESSEL 09/12/2016 Malachy Moan, MD [facility]  . IR GENERIC HISTORICAL  09/12/2016   IR ANGIOGRAM SELECTIVE EACH ADDITIONAL VESSEL 09/12/2016 Malachy Moan, MD [facility]  . IR GENERIC HISTORICAL  09/12/2016   IR US GUIDE VASC ACCESS RIGHT 09/12/2016 Malachy Moan, MD [facility]  .  IR GENERIC HISTORICAL  09/12/2016   IR ANGIOGRAM SELECTIVE EACH ADDITIONAL VESSEL 09/12/2016 Malachy Moan, MD [facility]  . IR GENERIC HISTORICAL  09/12/2016   IR EMBO ART  VEN HEMORR LYMPH EXTRAV  INC GUIDE ROADMAPPING 09/12/2016 Malachy Moan, MD [facility]  . IR GENERIC HISTORICAL  09/12/2016   IR ANGIOGRAM SELECTIVE EACH ADDITIONAL VESSEL 09/12/2016 Malachy Moan, MD [facility]  . LAPAROTOMY  08/10/2015   Procedure: EXPLORATORY LAPAROTOMY;  Surgeon: Glenna Fellows, MD;  Location: [facility];  Service: General;;  . LYSIS OF ADHESION  08/10/2015   Procedure: LYSIS OF ADHESION;  Surgeon: Glenna Fellows, MD;  Location: [facility];  Service: General;;  . small bowel obstruction  06/1996  . transthoracic cardiolite   11/03/2006    Social History   Social History  . Marital status: Widowed    Spouse name: N/A  . Number of children: N/A  . Years of education: N/A   Occupational History  . Not on file.   Social History Main Topics  . Smoking status: Former Smoker    Packs/day: 0.10    Years: 30.00    Types: Cigarettes    Quit date: 01/24/2010  . Smokeless tobacco: Never Used     Comment: smoked 1-2 cigarettes/day  . Alcohol use No  . Drug use: No  . Sexual activity: Not Currently   Other Topics Concern  . Not  on file   Social History Narrative  . No narrative on file    Current Outpatient Prescriptions on File Prior to Visit  Medication Sig Dispense Refill  . amLODipine (NORVASC) 2.5 MG tablet Take 1 tablet (2.5 mg total) by mouth daily. 90 tablet 3  . aspirin EC 81 MG tablet Take 81 mg by mouth daily.    . Incontinence Supply Disposable (REALITY INCONTINENT BRIEFS SM) MISC 10 per day  R32 300 each 11  . polyethylene glycol powder (GLYCOLAX/MIRALAX) powder Take 17 g by mouth daily. 500 g 2  . Tiotropium Bromide Monohydrate (SPIRIVA RESPIMAT) 2.5 MCG/ACT AERS Inhale 2 puffs into the lungs daily. 3 Inhaler 1   No current facility-administered medications on file  prior to visit.     No Known Allergies  No family history on file.  BP 132/70   Pulse 79   Ht 5' (1.524 m)   Wt 93 lb (42.2 kg)   SpO2 96%   BMI 18.16 kg/m   Review of Systems Denies cramps and numbness.     Objective:   Physical Exam VITAL SIGNS:  See vs page GENERAL: no distress Right EAC: occluded with cerumen.  Left eac and TM are normal.    Intervention: right eac is irrigated, with resolution of sxs.    Assessment & Plan:  Cerumen impaction, better with irrigation. Anemia: due for recheck Vit-D deficiency: due for recheck.   Check labs today.     Subjective:   Patient here for Medicare annual wellness visit and management of other chronic and acute problems.     Risk factors: advanced age    Roster of Physicians Providing Medical Care to Patient:  See "snapshot"   Activities of Daily Living: In your present state of health, do you have any difficulty performing the following activities (lives with granddtr)?:  Preparing food and eating?: yes Bathing yourself: No  Getting dressed: No  Using the toilet: No  Moving around from place to place: uses walker  In the past year have you fallen or had a near fall?: No    Home Safety: Has smoke detector and wears seat belts. No firearms.   Diet and Exercise  Current exercise habits: limited by advanced age.  Dietary issues discussed: healthy diet.     Depression Screen  Q1: Over the past two weeks, have you felt down, depressed or hopeless? no  Q2: Over the past two weeks, have you felt little interest or pleasure in doing things? no   The following portions of the patient's history were reviewed and updated as appropriate: allergies, current medications, past family history, past medical history, past social history, past surgical history and problem list.   Review of Systems  Denies hearing loss, and visual loss Objective:   Vision:  [place] opthalmologist, so she declines [facility] today.   Hearing: grossly  decreased Body mass index:  See vs page.   Msk: pt easily and quickly performs "get-up-and-go" from a sitting position.   Cognitive Impairment Assessment: cognition, memory and judgment appear normal.  remembers 2/3 at 5 minutes.  good recall.  can easily read and write a sentence.  alert and oriented x 3, except she says it is 06/18/17.     Assessment:   Medicare wellness utd on preventive parameters    Plan:   During the course of the visit the patient was educated and counseled about appropriate screening and preventive services including:       Fall prevention  Screening mammography is not indicated at this age Bone densitometry screening is ordered.  Diabetes screening is done Nutrition counseling is offered  Vaccines are updated as needed  Patient Instructions (the written plan) was given to the patient.

## 2017-06-20 NOTE — Patient Instructions (Signed)
Please consider these measures for your health:  minimize alcohol.  Do not use tobacco products.    Keep firearms safely stored.  Always use seat belts.  have working smoke alarms in your home.  See an eye doctor and dentist regularly.  Never drive under the influence of alcohol or drugs (including prescription drugs).   It is critically important to prevent falling down (keep floor areas well-lit, dry, and free of loose objects.  If you have a cane, walker, or wheelchair, you should use it, even for short trips around the house.  Wear flat-soled shoes.  Also, try not to rush).   blood tests are requested for you today.  We'll let you know about the results.   Please send us a copy of advance directive and [facility].   Please come back for a follow-up appointment in 6 months.

## 2017-06-22 ENCOUNTER — Telehealth: Payer: Self-pay

## 2017-06-22 NOTE — Telephone Encounter (Signed)
Notes recorded by Sunday SpillersBailey, Megan T, LPN on 1/61/09606/27/2018 at 2:56 PM EDT I contacted Bronson IngYvette (on DPR) and advised of Ms. Abdo's labs and new instructions via voicemail. Requested a call back if the patient would like to discuss further if need be.

## 2017-06-22 NOTE — Telephone Encounter (Signed)
Desiree Hutchinson called back and was advised of message. She voiced understanding.

## 2017-06-22 NOTE — Telephone Encounter (Signed)
-----   Message from Romero BellingSean Ellison, MD sent at 06/20/2017  7:04 PM EDT ----- please call patient: All you need to do is to take non-prescription vitamin-D, 2000 units per day.

## 2017-06-27 ENCOUNTER — Other Ambulatory Visit: Payer: Self-pay | Admitting: Endocrinology

## 2017-09-09 ENCOUNTER — Other Ambulatory Visit: Payer: Self-pay | Admitting: Pulmonary Disease

## 2017-09-26 DIAGNOSIS — Z961 Presence of intraocular lens: Secondary | ICD-10-CM | POA: Diagnosis not present

## 2017-09-26 DIAGNOSIS — H353131 Nonexudative age-related macular degeneration, bilateral, early dry stage: Secondary | ICD-10-CM | POA: Diagnosis not present

## 2017-10-21 ENCOUNTER — Encounter: Payer: Self-pay | Admitting: Cardiovascular Disease

## 2017-10-21 ENCOUNTER — Ambulatory Visit (INDEPENDENT_AMBULATORY_CARE_PROVIDER_SITE_OTHER): Payer: Medicare Other | Admitting: Cardiovascular Disease

## 2017-10-21 VITALS — BP 124/62 | HR 88 | Ht 60.0 in | Wt 92.8 lb

## 2017-10-21 DIAGNOSIS — I10 Essential (primary) hypertension: Secondary | ICD-10-CM

## 2017-10-21 DIAGNOSIS — I359 Nonrheumatic aortic valve disorder, unspecified: Secondary | ICD-10-CM

## 2017-10-21 DIAGNOSIS — I5032 Chronic diastolic (congestive) heart failure: Secondary | ICD-10-CM

## 2017-10-21 NOTE — Progress Notes (Signed)
Cardiology Office Note Date:  10/21/2017   ID:  Desiree Hutchinson, DOB 1920/04/02, MRN [id number]  PCP:  [name], MD  Cardiologist:  Tonny Bollman, MD    Chief Complaint  Patient presents with  . Shortness of Breath     History of Present Illness: Desiree Hutchinson is a 83 y.o. female who presents for follow-up of moderate aortic stenosis, hypertension, and chronic diastolic heart failure. She has longstanding shortness of breath.  The patient is here with her daughter today.  She uses a walker both inside and outside of her house.  She denies chest pain, chest pressure, lightheadedness, or syncope.  She has had no heart palpitations.  She has shortness of breath with activity but not at rest.  Her breathing is unchanged over recent years.  She denies cough or hemoptysis.  She has had no fevers or chills.   Past Medical History:  Diagnosis Date  . Allergic rhinitis   . Anemia   . Aortic valve disease    regurge > stenosis  . Chest pain, non-cardiac   . Chronic diastolic heart failure (HCC)   . COPD (chronic obstructive pulmonary disease) (HCC)   . Cough   . Gastritis   . History of atherosclerotic cardiovascular disease   . History of small bowel obstruction   . Hypertension   . Left ventricular hypertrophy   . Obesity   . Personal history of tobacco use, presenting hazards to health     Past Surgical History:  Procedure Laterality Date  . adenoasine cardiolite  11/09/2004  . APPENDECTOMY    . BOWEL RESECTION N/A 08/10/2015   Procedure: SMALL BOWEL OBSTRUCTION;  Surgeon: Glenna Fellows, MD;  Location: [facility];  Service: General;  Laterality: N/A;  . ESOPHAGOGASTRODUODENOSCOPY  11/09/1999  . HERNIA REPAIR    . IR GENERIC HISTORICAL  09/12/2016   IR FLUORO GUIDE CV LINE RIGHT 09/12/2016 Malachy Moan, MD [facility]  . IR GENERIC HISTORICAL  09/12/2016   IR US GUIDE VASC ACCESS RIGHT 09/12/2016 Malachy Moan, MD [facility]  . IR GENERIC HISTORICAL   09/12/2016   IR ANGIOGRAM SELECTIVE EACH ADDITIONAL VESSEL 09/12/2016 Malachy Moan, MD [facility]  . IR GENERIC HISTORICAL  09/12/2016   IR ANGIOGRAM VISCERAL SELECTIVE 09/12/2016 Malachy Moan, MD [facility]  . IR GENERIC HISTORICAL  09/12/2016   IR ANGIOGRAM SELECTIVE EACH ADDITIONAL VESSEL 09/12/2016 Malachy Moan, MD [facility]  . IR GENERIC HISTORICAL  09/12/2016   IR ANGIOGRAM SELECTIVE EACH ADDITIONAL VESSEL 09/12/2016 Malachy Moan, MD [facility]  . IR GENERIC HISTORICAL  09/12/2016   IR US GUIDE VASC ACCESS RIGHT 09/12/2016 Malachy Moan, MD [facility]  . IR GENERIC HISTORICAL  09/12/2016   IR ANGIOGRAM SELECTIVE EACH ADDITIONAL VESSEL 09/12/2016 Malachy Moan, MD [facility]  . IR GENERIC HISTORICAL  09/12/2016   IR EMBO ART  VEN HEMORR LYMPH EXTRAV  INC GUIDE ROADMAPPING 09/12/2016 Malachy Moan, MD [facility]  . IR GENERIC HISTORICAL  09/12/2016   IR ANGIOGRAM SELECTIVE EACH ADDITIONAL VESSEL 09/12/2016 Malachy Moan, MD [facility]  . LAPAROTOMY  08/10/2015   Procedure: EXPLORATORY LAPAROTOMY;  Surgeon: Glenna Fellows, MD;  Location: [facility];  Service: General;;  . LYSIS OF ADHESION  08/10/2015   Procedure: LYSIS OF ADHESION;  Surgeon: Glenna Fellows, MD;  Location: [facility];  Service: General;;  . small bowel obstruction  06/1996  . transthoracic cardiolite   11/03/2006    Current Outpatient Prescriptions  Medication Sig Dispense Refill  .  amLODipine (NORVASC) 2.5 MG tablet Take 1 tablet (2.5 mg total) by mouth daily. 90 tablet 3  . aspirin EC 81 MG tablet Take 81 mg by mouth daily.    . Incontinence Supply Disposable (REALITY INCONTINENT BRIEFS SM) MISC 10 per day  R32 300 each 11  . polyethylene glycol powder (GLYCOLAX/MIRALAX) powder Take 17 g by mouth daily. 500 g 2  . SPIRIVA RESPIMAT 2.5 MCG/ACT AERS INHALE 2 PUFFS INTO THE LUNGS DAILY. 1 Inhaler 0   No current facility-administered medications for this visit.     Allergies:    Patient has no known allergies.   Social History:  The patient  reports that she quit smoking about 7 years ago. Her smoking use included Cigarettes. She has a 3.00 pack-year smoking history. She has never used smokeless tobacco. She reports that she does not drink alcohol or use drugs.   Family History:  The patient's family history is not on file.  ROS:  Please see the history of present illness.  Otherwise, review of systems is positive for hearing loss.  All other systems are reviewed and negative.   PHYSICAL EXAM: VS:  BP 124/62   Pulse 88   Ht 5' (1.524 m)   Wt 92 lb 12.8 oz (42.1 kg)   SpO2 96%   BMI 18.12 kg/m  , BMI Body mass index is 18.12 kg/m. GEN: Pleasant elderly woman, frail-appearing, in no acute distress  HEENT: normal  Neck: no JVD, no masses. No carotid bruits Cardiac: RRR with 2/6 harsh systolic murmur at the RUSB and 2/6 diastolic murmur best heard at the LLSB Respiratory:  clear to auscultation bilaterally, normal work of breathing GI: soft, nontender, nondistended, + BS MS: no deformity or atrophy  Ext: no pretibial edema Skin: warm and dry, no rash Neuro:  Strength and sensation are intact Psych: euthymic mood, full affect  EKG:  EKG is ordered today. The ekg ordered today shows NSR 88 bpm, nonspecific ST abnormality  Recent Labs: 06/20/2017: BUN 22; Creatinine, Ser 0.95; Hemoglobin 12.9; Platelets 185.0; Potassium 3.9; Sodium 138   Lipid Panel     Component Value Date/Time   CHOL 198 01/10/2015 1611   TRIG 68 08/18/2015 0531   TRIG 103 10/27/2006 1030   HDL 88.60 01/10/2015 1611   CHOLHDL 2 01/10/2015 1611   VLDL 10.2 01/10/2015 1611   LDLCALC 99 01/10/2015 1611   LDLDIRECT 104.6 03/27/2013 1405      Wt Readings from Last 3 Encounters:  10/21/17 92 lb 12.8 oz (42.1 kg)  06/20/17 93 lb (42.2 kg)  03/18/17 94 lb (42.6 kg)     Cardiac Studies Reviewed: Echo 04-08-2017: Study Conclusions  - Left ventricle: The cavity size was normal. There  was mild   concentric hypertrophy. Systolic function was normal. The   estimated ejection fraction was in the range of 60% to 65%. Wall   motion was normal; there were no regional wall motion   abnormalities. Doppler parameters are consistent with abnormal   left ventricular relaxation (grade 1 diastolic dysfunction).   Doppler parameters are consistent with high ventricular filling   pressure. - Aortic valve: Valve mobility was restricted. There was moderate   stenosis. There was mild regurgitation. Valve area (VTI): 0.91   cm^2. Valve area (Vmax): 0.76 cm^2. Valve area (Vmean): 0.87   cm^2. - Mitral valve: Moderately calcified annulus. Transvalvular   velocity was within the normal range. There was no evidence for   stenosis. There was no regurgitation. - Left  atrium: The atrium was mildly dilated. - Right ventricle: The cavity size was normal. Wall thickness was   normal. Systolic function was normal. - Tricuspid valve: There was mild regurgitation. - Pulmonary arteries: Systolic pressure was within the normal   range. PA peak pressure: 32 mm Hg (S).  ASSESSMENT AND PLAN: 1.  Aortic stenosis: The patient's most recent echo images are personally reviewed.  She has a small left ventricle with hyperdynamic LV function and concentric hypertrophy.  The aortic valve is severely calcified and restricted.  There is moderate aortic insufficiency present.  The mean transvalvular gradient is 18 mmHg, dimensionless index 0.3.  Overall the patient's shortness of breath is remarkably stable.  While she is limited, there has not been significant progression of her dyspnea over the last few years.  The patient has no orthopnea, PND, or leg swelling.  She is chronically dyspneic and this predates having significant aortic valve disease.  I think considering her advanced age she should be continued on a program of medical therapy and observation with serial echo studies.  2.  Hypertension: Blood pressure  is well controlled on amlodipine  3.  Chronic diastolic heart failure: New York Heart Association functional class 3 symptoms.  No evidence of volume overload on exam.  Current medicines are reviewed with the patient today.  The patient does not have concerns regarding medicines.  Labs/ tests ordered today include:  No orders of the defined types were placed in this encounter.   Disposition:   FU one year with an echo prior to the visit  Signed, Tonny Bollman, MD  10/21/2017 2:35 PM    [facility] [address] [place], [place], [place]  [address] Phone: [phone]; Fax: [phone]

## 2017-10-21 NOTE — Patient Instructions (Signed)
Medication Instructions:  Your provider recommends that you continue on your current medications as directed. Please refer to the Current Medication list given to you today.    Labwork: None  Testing/Procedures: Your provider has requested that you have an echocardiogram in 1 year. Echocardiography is a painless test that uses sound waves to create images of your heart. It provides your doctor with information about the size and shape of your heart and how well your heart's chambers and valves are working. This procedure takes approximately one hour. There are no restrictions for this procedure.  Follow-Up: Your provider wants you to follow-up in: 1 year with Dr. Cooper. You will receive a reminder letter in the mail two months in advance. If you don't receive a letter, please call our office to schedule the follow-up appointment.    Any Other Special Instructions Will Be Listed Below (If Applicable).     If you need a refill on your cardiac medications before your next appointment, please call your pharmacy.   

## 2018-01-16 ENCOUNTER — Encounter: Payer: Self-pay | Admitting: Endocrinology

## 2018-01-16 ENCOUNTER — Ambulatory Visit (INDEPENDENT_AMBULATORY_CARE_PROVIDER_SITE_OTHER)
Admission: RE | Admit: 2018-01-16 | Discharge: 2018-01-16 | Disposition: A | Discharge status: Home / Self Care | Payer: Medicare Other | Source: Ambulatory Visit | Attending: Endocrinology | Admitting: Endocrinology

## 2018-01-16 ENCOUNTER — Ambulatory Visit (INDEPENDENT_AMBULATORY_CARE_PROVIDER_SITE_OTHER): Payer: Medicare Other | Admitting: Endocrinology

## 2018-01-16 VITALS — BP 128/60 | HR 64 | Wt 90.8 lb

## 2018-01-16 DIAGNOSIS — Z23 Encounter for immunization: Secondary | ICD-10-CM

## 2018-01-16 DIAGNOSIS — Z780 Asymptomatic menopausal state: Secondary | ICD-10-CM

## 2018-01-16 DIAGNOSIS — I10 Essential (primary) hypertension: Secondary | ICD-10-CM

## 2018-01-16 NOTE — Progress Notes (Signed)
Subjective:    Patient ID: Desiree Hutchinson, female    DOB: 11/15/1920, 84 y.o.   MRN: [id number]  HPI  Pt ret for f/u of HTN: she takes norvasc as rx'ed.  She does not take fe, but she takes vit-D, 2000 units/d    Past Medical History:  Diagnosis Date  . Allergic rhinitis   . Anemia   . Aortic valve disease    regurge > stenosis  . Chest pain, non-cardiac   . Chronic diastolic heart failure ([place])   . COPD (chronic obstructive pulmonary disease) ([place])   . Cough   . Gastritis   . History of atherosclerotic cardiovascular disease   . History of small bowel obstruction   . Hypertension   . Left ventricular hypertrophy   . Obesity   . Personal history of tobacco use, presenting hazards to health     Past Surgical History:  Procedure Laterality Date  . adenoasine cardiolite  11/09/2004  . APPENDECTOMY    . BOWEL RESECTION N/A 08/10/2015   Procedure: SMALL BOWEL OBSTRUCTION;  Surgeon: Excell Seltzer, MD;  Location: [facility];  Service: General;  Laterality: N/A;  . ESOPHAGOGASTRODUODENOSCOPY  11/09/1999  . HERNIA REPAIR    . IR GENERIC HISTORICAL  09/12/2016   IR FLUORO GUIDE CV LINE RIGHT 09/12/2016 Jacqulynn Cadet, MD [facility]  . IR GENERIC HISTORICAL  09/12/2016   IR US GUIDE VASC ACCESS RIGHT 09/12/2016 Jacqulynn Cadet, MD [facility]  . IR GENERIC HISTORICAL  09/12/2016   IR ANGIOGRAM SELECTIVE EACH ADDITIONAL VESSEL 09/12/2016 Jacqulynn Cadet, MD [facility]  . IR GENERIC HISTORICAL  09/12/2016   IR ANGIOGRAM VISCERAL SELECTIVE 09/12/2016 Jacqulynn Cadet, MD [facility]  . IR GENERIC HISTORICAL  09/12/2016   IR ANGIOGRAM SELECTIVE EACH ADDITIONAL VESSEL 09/12/2016 Jacqulynn Cadet, MD [facility]  . IR GENERIC HISTORICAL  09/12/2016   IR ANGIOGRAM SELECTIVE EACH ADDITIONAL VESSEL 09/12/2016 Jacqulynn Cadet, MD [facility]  . IR GENERIC HISTORICAL  09/12/2016   IR US GUIDE VASC ACCESS RIGHT 09/12/2016 Jacqulynn Cadet, MD [facility]  . IR GENERIC HISTORICAL   09/12/2016   IR ANGIOGRAM SELECTIVE EACH ADDITIONAL VESSEL 09/12/2016 Jacqulynn Cadet, MD [facility]  . IR GENERIC HISTORICAL  09/12/2016   IR EMBO ART  VEN HEMORR LYMPH EXTRAV  INC GUIDE ROADMAPPING 09/12/2016 Jacqulynn Cadet, MD [facility]  . IR GENERIC HISTORICAL  09/12/2016   IR ANGIOGRAM SELECTIVE EACH ADDITIONAL VESSEL 09/12/2016 Jacqulynn Cadet, MD [facility]  . LAPAROTOMY  08/10/2015   Procedure: EXPLORATORY LAPAROTOMY;  Surgeon: Excell Seltzer, MD;  Location: [facility];  Service: General;;  . LYSIS OF ADHESION  08/10/2015   Procedure: LYSIS OF ADHESION;  Surgeon: Excell Seltzer, MD;  Location: [facility];  Service: General;;  . small bowel obstruction  06/1996  . transthoracic cardiolite   11/03/2006    Social History   Socioeconomic History  . Marital status: Widowed    Spouse name: Not on file  . Number of children: Not on file  . Years of education: Not on file  . Highest education level: Not on file  Social Needs  . Financial resource strain: Not on file  . Food insecurity - worry: Not on file  . Food insecurity - inability: Not on file  . Transportation needs - medical: Not on file  . Transportation needs - non-medical: Not on file  Occupational History  . Not on file  Tobacco Use  . Smoking status: Former Smoker    Packs/day: 0.10  Years: 30.00    Pack years: 3.00    Types: Cigarettes    Last attempt to quit: 01/24/2010    Years since quitting: 7.9  . Smokeless tobacco: Never Used  . Tobacco comment: smoked 1-2 cigarettes/day  Substance and Sexual Activity  . Alcohol use: No    Alcohol/week: 0.0 oz  . Drug use: No  . Sexual activity: Not Currently  Other Topics Concern  . Not on file  Social History Narrative  . Not on file    Current Outpatient Medications on File Prior to Visit  Medication Sig Dispense Refill  . amLODipine (NORVASC) 2.5 MG tablet Take 1 tablet (2.5 mg total) by mouth daily. 90 tablet 3  . aspirin EC 81 MG tablet Take 81 mg by  mouth daily.    . Incontinence Supply Disposable (REALITY INCONTINENT BRIEFS SM) MISC 10 per day  R32 300 each 11  . polyethylene glycol powder (GLYCOLAX/MIRALAX) powder Take 17 g by mouth daily. 500 g 2  . SPIRIVA RESPIMAT 2.5 MCG/ACT AERS INHALE 2 PUFFS INTO THE LUNGS DAILY. 1 Inhaler 0   No current facility-administered medications on file prior to visit.     No Known Allergies  History reviewed. No pertinent family history.  BP 128/60 (BP Location: Right Arm, Patient Position: Sitting, Cuff Size: Normal)   Pulse 64   Wt 90 lb 12.8 oz (41.2 kg)   SpO2 98%   BMI 17.73 kg/m   Review of Systems Denies falls.    Objective:   Physical Exam Vital signs: see vs page Gen: elderly, frail, no distress. NECK: There is no palpable thyroid enlargement.  No thyroid nodule is palpable.  No palpable lymphadenopathy at the anterior neck. LUNGS:  Clear to auscultation.  HEART:  Regular rate and rhythm without murmurs noted. Normal S1,S2.   Gait: steady, with a walker.       Assessment & Plan:  HTN: well-controlled. Renal insuff: she declines recheck today. Vit-D def and fe deficiency: she declines labs today.   Patient Instructions  Please come back for a follow-up appointment in 6 months.  Please continue the same medications.

## 2018-01-16 NOTE — Patient Instructions (Addendum)
Please come back for a follow-up appointment in 6 months.   Please continue the same medications.   

## 2018-01-25 ENCOUNTER — Telehealth: Payer: Self-pay

## 2018-01-25 NOTE — Telephone Encounter (Signed)
-----   Message from Romero BellingSean Ellison, MD sent at 01/22/2018  4:58 PM EST ----- please call patient's dtr Osteoporosis is seen. There is a once per month pill you can take for this.   Ok to send a prescription?

## 2018-01-25 NOTE — Telephone Encounter (Signed)
Called dtr. No answer. Left Vmail per DPR.

## 2018-03-05 ENCOUNTER — Other Ambulatory Visit: Payer: Self-pay | Admitting: Cardiovascular Disease

## 2018-05-03 ENCOUNTER — Other Ambulatory Visit: Payer: Self-pay | Admitting: Pulmonary Disease

## 2018-05-12 ENCOUNTER — Other Ambulatory Visit: Payer: Self-pay | Admitting: Pulmonary Disease

## 2018-05-15 ENCOUNTER — Telehealth: Payer: Self-pay | Admitting: Pulmonary Disease

## 2018-05-15 NOTE — Telephone Encounter (Signed)
ATC Desiree Hutchinson, no answer. Left message for her to call back.  She needs an OV, last OV was in 2017

## 2018-05-15 NOTE — Telephone Encounter (Signed)
ATC pt, no answer. Left message for pt to call back.  

## 2018-05-15 NOTE — Telephone Encounter (Signed)
Pt granddaughter returning call CB [phone]

## 2018-05-16 NOTE — Telephone Encounter (Signed)
Attempted to call patient. Patient was not available. Patient's grandson answered and stated that he would tell her to call and schedule an appointment.

## 2018-05-17 ENCOUNTER — Telehealth: Payer: Self-pay | Admitting: Pulmonary Disease

## 2018-05-17 MED ORDER — TIOTROPIUM BROMIDE MONOHYDRATE 2.5 MCG/ACT IN AERS: 2.0000 | INHALATION_SPRAY | Freq: Every day | RESPIRATORY_TRACT | 0 refills | Status: DC

## 2018-05-17 NOTE — Telephone Encounter (Signed)
Called  Granddaughter back.  Explain that Dr Isaiah Serge is wanting her to have a follow up. Her last appt was in 2017. Granddaughter stated that she needed at [facility] refill and that she could bring her in for appt in June.  Appt was made for June 18,2019 at 0930.  Spiriva for 1 month was sent to preferred pharmacy.  Nothing further needed at this time.

## 2018-05-17 NOTE — Telephone Encounter (Signed)
Left message for pt's granddaughter letting her know pt needs an appointment. Nothing further was needed.

## 2018-06-13 ENCOUNTER — Encounter: Payer: Self-pay | Admitting: Pulmonary Disease

## 2018-06-13 ENCOUNTER — Ambulatory Visit (INDEPENDENT_AMBULATORY_CARE_PROVIDER_SITE_OTHER): Payer: Medicare Other | Admitting: Pulmonary Disease

## 2018-06-13 DIAGNOSIS — J439 Emphysema, unspecified: Secondary | ICD-10-CM

## 2018-06-13 NOTE — Progress Notes (Signed)
Inhaler training given. In-check peak flow #:60 

## 2018-06-13 NOTE — Progress Notes (Signed)
Desiree Hutchinson    [id number]    19-Mar-1920  Primary Care Physician:Ellison, Gregary Signs, MD  Referring Physician: [name], MD [address] [place], [place] [address]  Chief complaint: Follow-up for COPD  HPI: Desiree Hutchinson is a 84 year old with mild emphysema. Gold class B. She's been maintained on Spiriva with improvement in symptoms. She does not have any recent exacerbations or ER visits for resp issues. She was hospitalized in July 2016 with an acute diverticular bleed. She was transfused but did not end up getting a colonoscopy. Her bleeding resolved spontaneously. She was also hospitalized in August 2016 with a small bowel obstruction. She this was initially managed conservatively however she had to undergo surgery with lysis of adhesions. Hospitalized in 2017 for lower GI bleed.  Interim history: Last seen in the clinic in 2017.  Breathing is doing well on Spiriva with no new complaints No hospitalizations or ER visits since 2017.  Outpatient Encounter Medications as of 06/13/2018  Medication Sig  . amLODipine (NORVASC) 2.5 MG tablet TAKE 1 TABLET (2.5 MG TOTAL) BY MOUTH DAILY.  Marland Kitchen aspirin EC 81 MG tablet Take 81 mg by mouth daily.  . cholecalciferol (VITAMIN D) 400 units TABS tablet Take 400 Units by mouth.  . polyethylene glycol powder (GLYCOLAX/MIRALAX) powder Take 17 g by mouth daily.  . Tiotropium Bromide Monohydrate (SPIRIVA RESPIMAT) 2.5 MCG/ACT AERS Inhale 2 puffs into the lungs daily.  . [DISCONTINUED] Incontinence Supply Disposable (REALITY INCONTINENT BRIEFS SM) MISC 10 per day  R32 (Patient not taking: Reported on 06/13/2018)   No facility-administered encounter medications on file as of 06/13/2018.     Allergies as of 06/13/2018  . (No Known Allergies)    Past Medical History:  Diagnosis Date  . Allergic rhinitis   . Anemia   . Aortic valve disease    regurge > stenosis  . Chest pain, non-cardiac   . Chronic diastolic heart failure  (HCC)   . COPD (chronic obstructive pulmonary disease) (HCC)   . Cough   . Gastritis   . History of atherosclerotic cardiovascular disease   . History of small bowel obstruction   . Hypertension   . Left ventricular hypertrophy   . Obesity   . Personal history of tobacco use, presenting hazards to health     Past Surgical History:  Procedure Laterality Date  . adenoasine cardiolite  11/09/2004  . APPENDECTOMY    . BOWEL RESECTION N/A 08/10/2015   Procedure: SMALL BOWEL OBSTRUCTION;  Surgeon: Glenna Fellows, MD;  Location: [facility];  Service: General;  Laterality: N/A;  . ESOPHAGOGASTRODUODENOSCOPY  11/09/1999  . HERNIA REPAIR    . IR GENERIC HISTORICAL  09/12/2016   IR FLUORO GUIDE CV LINE RIGHT 09/12/2016 Malachy Moan, MD [facility]  . IR GENERIC HISTORICAL  09/12/2016   IR US GUIDE VASC ACCESS RIGHT 09/12/2016 Malachy Moan, MD [facility]  . IR GENERIC HISTORICAL  09/12/2016   IR ANGIOGRAM SELECTIVE EACH ADDITIONAL VESSEL 09/12/2016 Malachy Moan, MD [facility]  . IR GENERIC HISTORICAL  09/12/2016   IR ANGIOGRAM VISCERAL SELECTIVE 09/12/2016 Malachy Moan, MD [facility]  . IR GENERIC HISTORICAL  09/12/2016   IR ANGIOGRAM SELECTIVE EACH ADDITIONAL VESSEL 09/12/2016 Malachy Moan, MD [facility]  . IR GENERIC HISTORICAL  09/12/2016   IR ANGIOGRAM SELECTIVE EACH ADDITIONAL VESSEL 09/12/2016 Malachy Moan, MD [facility]  . IR GENERIC HISTORICAL  09/12/2016   IR US GUIDE VASC ACCESS RIGHT 09/12/2016  Malachy MoanHeath McCullough, MD [facility]  . IR GENERIC HISTORICAL  09/12/2016   IR ANGIOGRAM SELECTIVE EACH ADDITIONAL VESSEL 09/12/2016 Malachy MoanHeath McCullough, MD [facility]  . IR GENERIC HISTORICAL  09/12/2016   IR EMBO ART  VEN HEMORR LYMPH EXTRAV  INC GUIDE ROADMAPPING 09/12/2016 Malachy MoanHeath McCullough, MD [facility]  . IR GENERIC HISTORICAL  09/12/2016   IR ANGIOGRAM SELECTIVE EACH ADDITIONAL VESSEL 09/12/2016 Malachy MoanHeath McCullough, MD [facility]  . LAPAROTOMY  08/10/2015    Procedure: EXPLORATORY LAPAROTOMY;  Surgeon: Glenna FellowsBenjamin Hoxworth, MD;  Location: [facility];  Service: General;;  . LYSIS OF ADHESION  08/10/2015   Procedure: LYSIS OF ADHESION;  Surgeon: Glenna FellowsBenjamin Hoxworth, MD;  Location: [facility];  Service: General;;  . small bowel obstruction  06/1996  . transthoracic cardiolite   11/03/2006    No family history on file.  Social History   Socioeconomic History  . Marital status: Widowed    Spouse name: Not on file  . Number of children: Not on file  . Years of education: Not on file  . Highest education level: Not on file  Occupational History  . Not on file  Social Needs  . Financial resource strain: Not on file  . Food insecurity:    Worry: Not on file    Inability: Not on file  . Transportation needs:    Medical: Not on file    Non-medical: Not on file  Tobacco Use  . Smoking status: Former Smoker    Packs/day: 0.10    Years: 30.00    Pack years: 3.00    Types: Cigarettes    Last attempt to quit: 01/24/2010    Years since quitting: 8.3  . Smokeless tobacco: Never Used  . Tobacco comment: smoked 1-2 cigarettes/day  Substance and Sexual Activity  . Alcohol use: No    Alcohol/week: 0.0 oz  . Drug use: No  . Sexual activity: Not Currently  Lifestyle  . Physical activity:    Days per week: Not on file    Minutes per session: Not on file  . Stress: Not on file  Relationships  . Social connections:    Talks on phone: Not on file    Gets together: Not on file    Attends religious service: Not on file    Active member of club or organization: Not on file    Attends meetings of clubs or organizations: Not on file    Relationship status: Not on file  . Intimate partner violence:    Fear of current or ex partner: Not on file    Emotionally abused: Not on file    Physically abused: Not on file    Forced sexual activity: Not on file  Other Topics Concern  . Not on file  Social History Narrative  . Not on file    Review of systems: Review  of Systems  Constitutional: Negative for fever and chills.  HENT: Negative.   Eyes: Negative for blurred vision.  Respiratory: as per HPI  Cardiovascular: Negative for chest pain and palpitations.  Gastrointestinal: Negative for vomiting, diarrhea, blood per rectum. Genitourinary: Negative for dysuria, urgency, frequency and hematuria.  Musculoskeletal: Negative for myalgias, back pain and joint pain.  Skin: Negative for itching and rash.  Neurological: Negative for dizziness, tremors, focal weakness, seizures and loss of consciousness.  Endo/Heme/Allergies: Negative for environmental allergies.  Psychiatric/Behavioral: Negative for depression, suicidal ideas and hallucinations.  All other systems reviewed and are negative.  Physical Exam: Blood pressure 112/64, pulse 80,  height 5' (1.524 m), weight 85 lb 12.8 oz (38.9 kg), SpO2 98 %. Gen:      No acute distress HEENT:  EOMI, sclera anicteric Neck:     No masses; no thyromegaly Lungs:    Clear to auscultation bilaterally; normal respiratory effort CV:         Regular rate and rhythm; no murmurs Abd:      + bowel sounds; soft, non-tender; no palpable masses, no distension Ext:    No edema; adequate peripheral perfusion Skin:      Warm and dry; no rash Neuro: alert and oriented x 3 Psych: normal mood and affect  Data Reviewed: Spirometry 01/08/14 FVC 2.08 [119%], FEV1 1.06 [89%], F/F 51 Mild obstruction  Chest x-ray 08/06/15- Mild bibasilar atelectasis, mild vascular congestion. Chest x-ray 09/12/2016- chronic interstitial changes, calcified granuloma in the right midlung. I have reviewed the images personally.  Assessment:  Mild COPD Stable on Spiriva with no new complaints Continue inhaler as prescribed.  Health maintenance 01/16/2018-influenza 09/30/2016-Prevnar 08/27/1996-Pneumovax  Plan/Recommendations: - Continue Spiriva  Chilton Greathouse MD  Pulmonary and Critical Care 06/15/2018, 3:18 PM  CC: [name],  MD

## 2018-06-13 NOTE — Patient Instructions (Signed)
Glad you are feeling well Continue Spiriva Follow-up in 1 year.  Please call sooner if there is any change in his symptoms.

## 2018-06-14 ENCOUNTER — Other Ambulatory Visit: Payer: Self-pay | Admitting: Pulmonary Disease

## 2018-09-11 ENCOUNTER — Telehealth: Payer: Self-pay | Admitting: Cardiovascular Disease

## 2018-09-11 NOTE — Telephone Encounter (Signed)
New message   The patient is due to come in the office for an Echo in Oct same visit with Dr. Excell Seltzerooper. No available slots open for Dr. Excell Seltzerooper.

## 2018-09-12 NOTE — Telephone Encounter (Signed)
Attempted to call DPR [name]([name]). Left message to call back.   Will arrange echo 11/15 at 0930 and OV with Dr. Excell Seltzerooper 11/15 at 1040.

## 2018-09-26 ENCOUNTER — Other Ambulatory Visit: Payer: Self-pay | Admitting: Cardiovascular Disease

## 2018-10-26 ENCOUNTER — Telehealth: Payer: Self-pay

## 2018-10-26 NOTE — Telephone Encounter (Signed)
See 10/31 phone note. Patient is currently scheduled in February for echo and visit. Will reschedule.

## 2018-10-26 NOTE — Telephone Encounter (Signed)
Spoke with Desiree Hutchinson (patient's DPR), who put the patient Dr. [name] waiting list. Offered her to come in for echocardiogram and appointment with Dr. Excell Seltzer 11/15 and 11/18 at 0900. She will call back tomorrow to confirm which date. She was grateful for assistance.

## 2018-10-27 NOTE — Telephone Encounter (Signed)
Attempted to call but mailbox is full. Will try again Monday.

## 2018-11-01 NOTE — Telephone Encounter (Signed)
Left message for Desiree Hutchinson to have [facility] call and confirm appointment changes.

## 2018-11-02 NOTE — Telephone Encounter (Signed)
Desiree Hutchinson states she cannot make either appointment work and to keep appointments as scheduled in February. She was grateful for assistance.

## 2019-01-31 ENCOUNTER — Other Ambulatory Visit: Payer: Self-pay

## 2019-01-31 DIAGNOSIS — I359 Nonrheumatic aortic valve disorder, unspecified: Secondary | ICD-10-CM

## 2019-02-01 ENCOUNTER — Ambulatory Visit (INDEPENDENT_AMBULATORY_CARE_PROVIDER_SITE_OTHER): Payer: Medicare Other | Admitting: Cardiovascular Disease

## 2019-02-01 ENCOUNTER — Encounter: Payer: Self-pay | Admitting: Cardiovascular Disease

## 2019-02-01 ENCOUNTER — Encounter

## 2019-02-01 ENCOUNTER — Encounter (INDEPENDENT_AMBULATORY_CARE_PROVIDER_SITE_OTHER): Payer: Self-pay

## 2019-02-01 ENCOUNTER — Ambulatory Visit (HOSPITAL_COMMUNITY): Payer: Medicare Other | Attending: Cardiovascular Disease

## 2019-02-01 VITALS — BP 154/76 | HR 81 | Ht 60.0 in | Wt 92.8 lb

## 2019-02-01 DIAGNOSIS — I350 Nonrheumatic aortic (valve) stenosis: Secondary | ICD-10-CM

## 2019-02-01 DIAGNOSIS — I5032 Chronic diastolic (congestive) heart failure: Secondary | ICD-10-CM | POA: Diagnosis not present

## 2019-02-01 DIAGNOSIS — I359 Nonrheumatic aortic valve disorder, unspecified: Secondary | ICD-10-CM

## 2019-02-01 LAB — ECHOCARDIOGRAM COMPLETE: Height: 60 in

## 2019-02-01 NOTE — Progress Notes (Signed)
Cardiology Office Note:    Date:  02/01/2019   ID:  Desiree Hutchinson, DOB 1920-09-17, MRN [id number]  PCP:  [name], MD  Cardiologist:  No primary care provider on file.  Electrophysiologist:  None   Referring MD: [name], MD   Chief Complaint  Patient presents with  . Shortness of Breath    History of Present Illness:    Desiree Hutchinson is a 85 y.o. female with a hx of moderate aortic stenosis, hypertension, and chronic diastolic heart failure presenting for follow-up evaluation.  Patient has longstanding shortness of breath.  Her last echo study in April 2018 demonstrated vigorous LV systolic function with LVEF 60 to 65% and moderate aortic stenosis with a mean transvalvular gradient of 18 mmHg, DVI of 0.34, and peak velocity of 323 cm/s.  The patient is here with her daughter today.  She reports no real change in clinical symptoms since her visit last year.  The patient's chronic shortness of breath is unchanged and not particularly limiting to her.  She denies orthopnea, PND, or chest pain.  She is had no lightheadedness or syncope.  She lives with her daughter and is compliant with her medications.  Past Medical History:  Diagnosis Date  . Allergic rhinitis   . Anemia   . Aortic valve disease    regurge > stenosis  . Chest pain, non-cardiac   . Chronic diastolic heart failure (HCC)   . COPD (chronic obstructive pulmonary disease) (HCC)   . Cough   . Gastritis   . History of atherosclerotic cardiovascular disease   . History of small bowel obstruction   . Hypertension   . Left ventricular hypertrophy   . Obesity   . Personal history of tobacco use, presenting hazards to health     Past Surgical History:  Procedure Laterality Date  . adenoasine cardiolite  11/09/2004  . APPENDECTOMY    . BOWEL RESECTION N/A 08/10/2015   Procedure: SMALL BOWEL OBSTRUCTION;  Surgeon: Glenna Fellows, MD;  Location: [facility];  Service: General;  Laterality: N/A;  .  ESOPHAGOGASTRODUODENOSCOPY  11/09/1999  . HERNIA REPAIR    . IR GENERIC HISTORICAL  09/12/2016   IR FLUORO GUIDE CV LINE RIGHT 09/12/2016 Malachy Moan, MD [facility]  . IR GENERIC HISTORICAL  09/12/2016   IR US GUIDE VASC ACCESS RIGHT 09/12/2016 Malachy Moan, MD [facility]  . IR GENERIC HISTORICAL  09/12/2016   IR ANGIOGRAM SELECTIVE EACH ADDITIONAL VESSEL 09/12/2016 Malachy Moan, MD [facility]  . IR GENERIC HISTORICAL  09/12/2016   IR ANGIOGRAM VISCERAL SELECTIVE 09/12/2016 Malachy Moan, MD [facility]  . IR GENERIC HISTORICAL  09/12/2016   IR ANGIOGRAM SELECTIVE EACH ADDITIONAL VESSEL 09/12/2016 Malachy Moan, MD [facility]  . IR GENERIC HISTORICAL  09/12/2016   IR ANGIOGRAM SELECTIVE EACH ADDITIONAL VESSEL 09/12/2016 Malachy Moan, MD [facility]  . IR GENERIC HISTORICAL  09/12/2016   IR US GUIDE VASC ACCESS RIGHT 09/12/2016 Malachy Moan, MD [facility]  . IR GENERIC HISTORICAL  09/12/2016   IR ANGIOGRAM SELECTIVE EACH ADDITIONAL VESSEL 09/12/2016 Malachy Moan, MD [facility]  . IR GENERIC HISTORICAL  09/12/2016   IR EMBO ART  VEN HEMORR LYMPH EXTRAV  INC GUIDE ROADMAPPING 09/12/2016 Malachy Moan, MD [facility]  . IR GENERIC HISTORICAL  09/12/2016   IR ANGIOGRAM SELECTIVE EACH ADDITIONAL VESSEL 09/12/2016 Malachy Moan, MD [facility]  . LAPAROTOMY  08/10/2015   Procedure: EXPLORATORY LAPAROTOMY;  Surgeon: Glenna Fellows, MD;  Location: [facility];  Service: General;;  . LYSIS OF ADHESION  08/10/2015   Procedure: LYSIS OF ADHESION;  Surgeon: Glenna FellowsBenjamin Hoxworth, MD;  Location: [facility];  Service: General;;  . small bowel obstruction  06/1996  . transthoracic cardiolite   11/03/2006    Current Medications: Current Meds  Medication Sig  . amLODipine (NORVASC) 2.5 MG tablet TAKE 1 TABLET (2.5 MG TOTAL) BY MOUTH DAILY.  Marland Kitchen. aspirin EC 81 MG tablet Take 81 mg by mouth daily.  . cholecalciferol (VITAMIN D) 400 units TABS tablet Take 400 Units by mouth.    . polyethylene glycol powder (GLYCOLAX/MIRALAX) powder Take 17 g by mouth daily.  Marland Kitchen. SPIRIVA RESPIMAT 2.5 MCG/ACT AERS INHALE 2 PUFFS INTO THE LUNGS DAILY.     Allergies:   Patient has no known allergies.   Social History   Socioeconomic History  . Marital status: Widowed    Spouse name: Not on file  . Number of children: Not on file  . Years of education: Not on file  . Highest education level: Not on file  Occupational History  . Not on file  Social Needs  . Financial resource strain: Not on file  . Food insecurity:    Worry: Not on file    Inability: Not on file  . Transportation needs:    Medical: Not on file    Non-medical: Not on file  Tobacco Use  . Smoking status: Former Smoker    Packs/day: 0.10    Years: 30.00    Pack years: 3.00    Types: Cigarettes    Last attempt to quit: 01/24/2010    Years since quitting: 9.0  . Smokeless tobacco: Never Used  . Tobacco comment: smoked 1-2 cigarettes/day  Substance and Sexual Activity  . Alcohol use: No    Alcohol/week: 0.0 standard drinks  . Drug use: No  . Sexual activity: Not Currently  Lifestyle  . Physical activity:    Days per week: Not on file    Minutes per session: Not on file  . Stress: Not on file  Relationships  . Social connections:    Talks on phone: Not on file    Gets together: Not on file    Attends religious service: Not on file    Active member of club or organization: Not on file    Attends meetings of clubs or organizations: Not on file    Relationship status: Not on file  Other Topics Concern  . Not on file  Social History Narrative  . Not on file     Family History: The patient's family history is not on file.  ROS:   Please see the history of present illness.    Positive for hearing loss and urinary frequency.  All other systems reviewed and are negative.  EKGs/Labs/Other Studies Reviewed:    The following studies were reviewed today: Echo 04/08/2017: Study Conclusions  -  Left ventricle: The cavity size was normal. There was mild   concentric hypertrophy. Systolic function was normal. The   estimated ejection fraction was in the range of 60% to 65%. Wall   motion was normal; there were no regional wall motion   abnormalities. Doppler parameters are consistent with abnormal   left ventricular relaxation (grade 1 diastolic dysfunction).   Doppler parameters are consistent with high ventricular filling   pressure. - Aortic valve: Valve mobility was restricted. There was moderate   stenosis. There was mild regurgitation. Valve area (VTI): 0.91   cm^2. Valve area (Vmax): 0.76 cm^2.  Valve area (Vmean): 0.87   cm^2. - Mitral valve: Moderately calcified annulus. Transvalvular   velocity was within the normal range. There was no evidence for   stenosis. There was no regurgitation. - Left atrium: The atrium was mildly dilated. - Right ventricle: The cavity size was normal. Wall thickness was   normal. Systolic function was normal. - Tricuspid valve: There was mild regurgitation. - Pulmonary arteries: Systolic pressure was within the normal   range. PA peak pressure: 32 mm Hg (S).  EKG:  EKG is ordered today.  The ekg ordered today demonstrates normal sinus rhythm 81 bpm, nonspecific T wave abnormality with T wave flattening.  No significant ST change.  Recent Labs: No results found for requested labs within last 8760 hours.  Recent Lipid Panel    Component Value Date/Time   CHOL 198 01/10/2015 1611   TRIG 68 08/18/2015 0531   TRIG 103 10/27/2006 1030   HDL 88.60 01/10/2015 1611   CHOLHDL 2 01/10/2015 1611   VLDL 10.2 01/10/2015 1611   LDLCALC 99 01/10/2015 1611   LDLDIRECT 104.6 03/27/2013 1405    Physical Exam:    VS:  BP (!) 154/76   Pulse 81   Ht 5' (1.524 m)   Wt 92 lb 12.8 oz (42.1 kg)   SpO2 98%   BMI 18.12 kg/m     Wt Readings from Last 3 Encounters:  02/01/19 92 lb 12.8 oz (42.1 kg)  06/13/18 85 lb 12.8 oz (38.9 kg)  01/16/18 90 lb  12.8 oz (41.2 kg)     GEN: Elderly, very thin woman, in no acute distress HEENT: Normal NECK: No JVD; No carotid bruits LYMPHATICS: No lymphadenopathy CARDIAC: RRR, 2/6 harsh systolic murmur at the right upper sternal border RESPIRATORY: Scattered rhonchi bilaterally ABDOMEN: Soft, non-tender, non-distended MUSCULOSKELETAL:  No edema; No deformity  SKIN: Warm and dry NEUROLOGIC:  Alert and oriented x 3 PSYCHIATRIC:  Normal affect   ASSESSMENT:    1. Aortic valve stenosis, etiology of cardiac valve disease unspecified   2. Chronic diastolic heart failure (HCC)    PLAN:    In order of problems listed above:  1. The patient appears stable with New York Heart Association functional class II symptoms of chronic diastolic heart failure and moderate aortic stenosis.  Her exam remains consistent with moderate aortic stenosis.  She will have an echocardiogram following her visit today.  Considering her very advanced age and frailty, I would not be inclined to proceed with any intervention unless she was demonstrating signs of significant decompensation or marked progression of her aortic stenosis.  I will plan to see her back in 1 year for follow-up evaluation.   Medication Adjustments/Labs and Tests Ordered: Current medicines are reviewed at length with the patient today.  Concerns regarding medicines are outlined above.  Orders Placed This Encounter  Procedures  . EKG 12-Lead   No orders of the defined types were placed in this encounter.   Patient Instructions  Medication Instructions:  Your provider recommends that you continue on your current medications as directed. Please refer to the Current Medication list given to you today.    Labwork: None  Testing/Procedures: None  Follow-Up: Your provider wants you to follow-up in: 1 year with Dr. Excell Seltzer. You will receive a reminder letter in the mail two months in advance. If you don't receive a letter, please call our office to  schedule the follow-up appointment.      Signed, Tonny Bollman, MD  02/01/2019 3:24 PM  [facility]

## 2019-02-01 NOTE — Patient Instructions (Signed)
Medication Instructions:  Your provider recommends that you continue on your current medications as directed. Please refer to the Current Medication list given to you today.    Labwork: None  Testing/Procedures: None  Follow-Up: Your provider wants you to follow-up in: 1 year with Dr. Cooper. You will receive a reminder letter in the mail two months in advance. If you don't receive a letter, please call our office to schedule the follow-up appointment.    

## 2019-02-28 ENCOUNTER — Other Ambulatory Visit: Payer: Self-pay | Admitting: Cardiovascular Disease

## 2019-03-23 ENCOUNTER — Other Ambulatory Visit: Payer: Self-pay | Admitting: Pulmonary Disease

## 2019-05-01 ENCOUNTER — Telehealth: Payer: Self-pay

## 2019-05-01 ENCOUNTER — Telehealth: Payer: Self-pay | Admitting: Nurse Practitioner

## 2019-05-01 NOTE — Telephone Encounter (Signed)
Patients grandson Fayrene Fearing states pt is requesting a refill of miralax powder that NP Gunnar Fusi prescribed in 2017. Please advise or refill at [facility].

## 2019-05-01 NOTE — Telephone Encounter (Signed)
Spoke with patients grandson. I told him Miralax can no longer be prescribed.  I told him I would be happy to mail coupons to help with cost.  Also, told him if Ms. Brownback continues to have issue with constipation that we can do a telephone visit.  Coupons mailed.

## 2019-10-25 ENCOUNTER — Ambulatory Visit: Payer: Medicare Other

## 2019-10-26 ENCOUNTER — Ambulatory Visit (INDEPENDENT_AMBULATORY_CARE_PROVIDER_SITE_OTHER): Payer: Medicare Other

## 2019-10-26 ENCOUNTER — Other Ambulatory Visit: Payer: Self-pay

## 2019-10-26 DIAGNOSIS — Z23 Encounter for immunization: Secondary | ICD-10-CM | POA: Diagnosis not present

## 2019-12-17 ENCOUNTER — Ambulatory Visit (INDEPENDENT_AMBULATORY_CARE_PROVIDER_SITE_OTHER): Payer: Medicare Other | Admitting: Family Medicine

## 2019-12-17 ENCOUNTER — Encounter: Payer: Self-pay | Admitting: Family Medicine

## 2019-12-17 VITALS — BP 124/82 | HR 88 | Wt 90.8 lb

## 2019-12-17 DIAGNOSIS — K5900 Constipation, unspecified: Secondary | ICD-10-CM | POA: Diagnosis not present

## 2019-12-17 DIAGNOSIS — I359 Nonrheumatic aortic valve disorder, unspecified: Secondary | ICD-10-CM | POA: Diagnosis not present

## 2019-12-17 DIAGNOSIS — N289 Disorder of kidney and ureter, unspecified: Secondary | ICD-10-CM

## 2019-12-17 DIAGNOSIS — I10 Essential (primary) hypertension: Secondary | ICD-10-CM | POA: Diagnosis not present

## 2019-12-17 DIAGNOSIS — I5032 Chronic diastolic (congestive) heart failure: Secondary | ICD-10-CM | POA: Diagnosis not present

## 2019-12-17 DIAGNOSIS — H9190 Unspecified hearing loss, unspecified ear: Secondary | ICD-10-CM

## 2019-12-17 DIAGNOSIS — J439 Emphysema, unspecified: Secondary | ICD-10-CM

## 2019-12-17 MED ORDER — POLYETHYLENE GLYCOL 3350 17 GM/SCOOP PO POWD: 17.0000 g | Freq: Every day | ORAL | 2 refills | Status: DC

## 2019-12-17 NOTE — Progress Notes (Signed)
   Subjective:    Patient ID: Desiree Hutchinson, female    DOB: [birth year], 85 y.o.   MRN: [id number]  HPI Chief Complaint  Patient presents with  . new pt    new pt get established. no concerns   She is new to the practice and here to establish care.  Previous medical care: Dr. Renato Shin was her PCP but he is now an endocrinologist   Her granddaughter is with her today. 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   Other providers: Cardiologist - Dr. Burt Knack  Pulmonologist- Dr. Vaughan Browner  Eye doctor- Dr. Gershon Crane  GI- Dr. Henrene Pastor   She is able to manage her ADLs.  Still cooks at times.   No falls. Uses a walker.   4 children (only one alive) and 24 grandchildren.   Appetite is good per patient.   States she is having issues with constipation. This is a chronic ongoing issue for many years. She take miralax as needed. Request prescription. Hx of small bowel obstructions. Denies abdominal pain, N/V.   CHF, aortic valve disorder and HTN managed by cardiologist. Denies any recent weight gain, chest pain, palpitations, cough, shortness of breath, orthopnea or LE edema.   She is on Spiriva for COPD. Followed by pulmonologist.   Sleeps during day some and is up some at night   Immunizations: flu shot UTD   Reviewed allergies, medications, past medical, surgical, family, and social history.   Review of Systems Pertinent positives and negatives in the history of present illness.     Objective:   Physical Exam Constitutional:      Appearance: She is not ill-appearing.  Cardiovascular:     Rate and Rhythm: Normal rate and regular rhythm.     Heart sounds: Murmur present.  Pulmonary:     Effort: Pulmonary effort is normal.     Breath sounds: Normal breath sounds.  Abdominal:     General: Abdomen is flat.     Palpations: Abdomen is soft.     Tenderness: There is no abdominal tenderness.  Musculoskeletal:   Right lower leg: No edema.     Left lower leg: No edema.  Skin:    General: Skin is warm and dry.  Neurological:     Mental Status: She is alert.    BP 124/82   Pulse 88   Wt 90 lb 12.8 oz (41.2 kg)   SpO2 99%   BMI 17.73 kg/m       Assessment & Plan:  Constipation, unspecified constipation type - Plan: polyethylene glycol powder (GLYCOLAX/MIRALAX) 17 GM/SCOOP powder - this seems to be an ongoing issue. Hx of SBO with surgery. Recommend keeping stools soft and having a more regular bowel regimen. Discussed using Miralax more often to keep her regular. Abdominal exam benign.   Essential hypertension - Plan: CBC with Differential, Comprehensive metabolic panel -BP in goal range.   Aortic valve disorder  Chronic diastolic heart failure ([place]) - Plan: Comprehensive metabolic panel - euvolemic appearing. Follow up as needed for edema or weight gain. Followed by cardiologist   Pulmonary emphysema, unspecified emphysema type ([place]) -stable on Spiriva   Renal insufficiency - Plan: Comprehensive metabolic panel -check labs and follow up   Hearing loss, unspecified hearing loss type, unspecified laterality

## 2019-12-18 ENCOUNTER — Other Ambulatory Visit: Payer: Self-pay | Admitting: Pulmonary Disease

## 2019-12-18 LAB — CBC WITH DIFFERENTIAL/PLATELET
Basophils Absolute: 0 10*3/uL (ref 0.0–0.2)
Basos: 1 %
EOS (ABSOLUTE): 0.1 10*3/uL (ref 0.0–0.4)
Eos: 1 %
Hematocrit: 38.2 % (ref 34.0–46.6)
Hemoglobin: 12.4 g/dL (ref 11.1–15.9)
Immature Grans (Abs): 0 10*3/uL (ref 0.0–0.1)
Immature Granulocytes: 0 %
Lymphocytes Absolute: 1 10*3/uL (ref 0.7–3.1)
Lymphs: 21 %
MCH: 32.9 pg (ref 26.6–33.0)
MCHC: 32.5 g/dL (ref 31.5–35.7)
MCV: 101 fL — ABNORMAL HIGH (ref 79–97)
Monocytes Absolute: 0.3 10*3/uL (ref 0.1–0.9)
Monocytes: 7 %
Neutrophils Absolute: 3.4 10*3/uL (ref 1.4–7.0)
Neutrophils: 70 %
Platelets: 215 10*3/uL (ref 150–450)
RBC: 3.77 x10E6/uL (ref 3.77–5.28)
RDW: 14.3 % (ref 11.7–15.4)
WBC: 4.9 10*3/uL (ref 3.4–10.8)

## 2019-12-18 LAB — COMPREHENSIVE METABOLIC PANEL
ALT: 10 IU/L (ref 0–32)
AST: 21 IU/L (ref 0–40)
Albumin/Globulin Ratio: 1.7 (ref 1.2–2.2)
Albumin: 4.8 g/dL — ABNORMAL HIGH (ref 3.5–4.6)
Alkaline Phosphatase: 137 IU/L — ABNORMAL HIGH (ref 39–117)
BUN/Creatinine Ratio: 18 (ref 12–28)
BUN: 18 mg/dL (ref 10–36)
Bilirubin Total: 0.4 mg/dL (ref 0.0–1.2)
CO2: 20 mmol/L (ref 20–29)
Calcium: 10.2 mg/dL (ref 8.7–10.3)
Chloride: 107 mmol/L — ABNORMAL HIGH (ref 96–106)
Creatinine, Ser: 1.01 mg/dL — ABNORMAL HIGH (ref 0.57–1.00)
GFR calc Af Amer: 54 mL/min/{1.73_m2} — ABNORMAL LOW (ref >59)
GFR calc non Af Amer: 47 mL/min/{1.73_m2} — ABNORMAL LOW (ref >59)
Globulin, Total: 2.8 g/dL (ref 1.5–4.5)
Glucose: 79 mg/dL (ref 65–99)
Potassium: 4.4 mmol/L (ref 3.5–5.2)
Sodium: 142 mmol/L (ref 134–144)
Total Protein: 7.6 g/dL (ref 6.0–8.5)

## 2020-01-08 ENCOUNTER — Encounter (HOSPITAL_COMMUNITY): Payer: Self-pay | Admitting: Emergency Medicine

## 2020-01-08 ENCOUNTER — Other Ambulatory Visit: Payer: Self-pay

## 2020-01-08 ENCOUNTER — Inpatient Hospital Stay (HOSPITAL_COMMUNITY)
Admission: EM | Admit: 2020-01-08 | Discharge: 2020-01-12 | DRG: 377 | Disposition: A | Discharge status: Home / Self Care | Payer: Medicare Other | Attending: Family Medicine | Admitting: Family Medicine

## 2020-01-08 DIAGNOSIS — I130 Hypertensive heart and chronic kidney disease with heart failure and stage 1 through stage 4 chronic kidney disease, or unspecified chronic kidney disease: Secondary | ICD-10-CM | POA: Diagnosis present

## 2020-01-08 DIAGNOSIS — E43 Unspecified severe protein-calorie malnutrition: Secondary | ICD-10-CM | POA: Diagnosis not present

## 2020-01-08 DIAGNOSIS — I5032 Chronic diastolic (congestive) heart failure: Secondary | ICD-10-CM | POA: Diagnosis present

## 2020-01-08 DIAGNOSIS — Z681 Body mass index (BMI) 19 or less, adult: Secondary | ICD-10-CM

## 2020-01-08 DIAGNOSIS — I350 Nonrheumatic aortic (valve) stenosis: Secondary | ICD-10-CM | POA: Diagnosis present

## 2020-01-08 DIAGNOSIS — R54 Age-related physical debility: Secondary | ICD-10-CM | POA: Diagnosis present

## 2020-01-08 DIAGNOSIS — Z20822 Contact with and (suspected) exposure to covid-19: Secondary | ICD-10-CM | POA: Diagnosis present

## 2020-01-08 DIAGNOSIS — K5731 Diverticulosis of large intestine without perforation or abscess with bleeding: Secondary | ICD-10-CM | POA: Diagnosis not present

## 2020-01-08 DIAGNOSIS — K5900 Constipation, unspecified: Secondary | ICD-10-CM

## 2020-01-08 DIAGNOSIS — D62 Acute posthemorrhagic anemia: Secondary | ICD-10-CM | POA: Diagnosis not present

## 2020-01-08 DIAGNOSIS — N179 Acute kidney failure, unspecified: Secondary | ICD-10-CM | POA: Diagnosis present

## 2020-01-08 DIAGNOSIS — K922 Gastrointestinal hemorrhage, unspecified: Secondary | ICD-10-CM

## 2020-01-08 DIAGNOSIS — Z79899 Other long term (current) drug therapy: Secondary | ICD-10-CM

## 2020-01-08 DIAGNOSIS — Z7982 Long term (current) use of aspirin: Secondary | ICD-10-CM

## 2020-01-08 DIAGNOSIS — N189 Chronic kidney disease, unspecified: Secondary | ICD-10-CM

## 2020-01-08 DIAGNOSIS — Z87891 Personal history of nicotine dependence: Secondary | ICD-10-CM

## 2020-01-08 DIAGNOSIS — Z7951 Long term (current) use of inhaled steroids: Secondary | ICD-10-CM

## 2020-01-08 DIAGNOSIS — I2510 Atherosclerotic heart disease of native coronary artery without angina pectoris: Secondary | ICD-10-CM | POA: Diagnosis present

## 2020-01-08 DIAGNOSIS — U071 COVID-19: Secondary | ICD-10-CM | POA: Diagnosis not present

## 2020-01-08 DIAGNOSIS — J439 Emphysema, unspecified: Secondary | ICD-10-CM | POA: Diagnosis present

## 2020-01-08 DIAGNOSIS — Z66 Do not resuscitate: Secondary | ICD-10-CM | POA: Diagnosis present

## 2020-01-08 DIAGNOSIS — I10 Essential (primary) hypertension: Secondary | ICD-10-CM | POA: Diagnosis present

## 2020-01-08 DIAGNOSIS — I129 Hypertensive chronic kidney disease with stage 1 through stage 4 chronic kidney disease, or unspecified chronic kidney disease: Secondary | ICD-10-CM | POA: Diagnosis not present

## 2020-01-08 DIAGNOSIS — N183 Chronic kidney disease, stage 3 unspecified: Secondary | ICD-10-CM | POA: Diagnosis present

## 2020-01-08 DIAGNOSIS — J309 Allergic rhinitis, unspecified: Secondary | ICD-10-CM | POA: Diagnosis present

## 2020-01-08 LAB — CBC WITH DIFFERENTIAL/PLATELET
Abs Immature Granulocytes: 0.04 10*3/uL (ref 0.00–0.07)
Basophils Absolute: 0 10*3/uL (ref 0.0–0.1)
Basophils Relative: 1 %
Eosinophils Absolute: 0.3 10*3/uL (ref 0.0–0.5)
Eosinophils Relative: 5 %
HCT: 30.9 % — ABNORMAL LOW (ref 36.0–46.0)
Hemoglobin: 9.8 g/dL — ABNORMAL LOW (ref 12.0–15.0)
Immature Granulocytes: 1 %
Lymphocytes Relative: 36 %
Lymphs Abs: 1.7 10*3/uL (ref 0.7–4.0)
MCH: 33.3 pg (ref 26.0–34.0)
MCHC: 31.7 g/dL (ref 30.0–36.0)
MCV: 105.1 fL — ABNORMAL HIGH (ref 80.0–100.0)
Monocytes Absolute: 0.3 10*3/uL (ref 0.1–1.0)
Monocytes Relative: 7 %
Neutro Abs: 2.4 10*3/uL (ref 1.7–7.7)
Neutrophils Relative %: 50 %
Platelets: 251 10*3/uL (ref 150–400)
RBC: 2.94 MIL/uL — ABNORMAL LOW (ref 3.87–5.11)
RDW: 13.5 % (ref 11.5–15.5)
WBC: 4.8 10*3/uL (ref 4.0–10.5)
nRBC: 0 % (ref 0.0–0.2)

## 2020-01-08 LAB — COMPREHENSIVE METABOLIC PANEL
ALT: 9 U/L (ref 0–44)
AST: 21 U/L (ref 15–41)
Albumin: 3.1 g/dL — ABNORMAL LOW (ref 3.5–5.0)
Alkaline Phosphatase: 75 U/L (ref 38–126)
Anion gap: 10 (ref 5–15)
BUN: 24 mg/dL — ABNORMAL HIGH (ref 8–23)
CO2: 20 mmol/L — ABNORMAL LOW (ref 22–32)
Calcium: 8.8 mg/dL — ABNORMAL LOW (ref 8.9–10.3)
Chloride: 106 mmol/L (ref 98–111)
Creatinine, Ser: 1.36 mg/dL — ABNORMAL HIGH (ref 0.44–1.00)
GFR calc Af Amer: 38 mL/min — ABNORMAL LOW (ref >60)
GFR calc non Af Amer: 33 mL/min — ABNORMAL LOW (ref >60)
Glucose, Bld: 166 mg/dL — ABNORMAL HIGH (ref 70–99)
Potassium: 4.4 mmol/L (ref 3.5–5.1)
Sodium: 136 mmol/L (ref 135–145)
Total Bilirubin: 0.6 mg/dL (ref 0.3–1.2)
Total Protein: 6.1 g/dL — ABNORMAL LOW (ref 6.5–8.1)

## 2020-01-08 LAB — SAMPLE TO BLOOD BANK

## 2020-01-08 LAB — PROTIME-INR
INR: 1.1 (ref 0.8–1.2)
Prothrombin Time: 13.9 seconds (ref 11.4–15.2)

## 2020-01-08 NOTE — ED Triage Notes (Signed)
Patient arrived with granddaughter reports bloody stools today with fatigue , generalized weakness and pale .

## 2020-01-09 ENCOUNTER — Encounter (HOSPITAL_COMMUNITY): Payer: Self-pay | Admitting: Internal Medicine

## 2020-01-09 DIAGNOSIS — N179 Acute kidney failure, unspecified: Secondary | ICD-10-CM | POA: Diagnosis present

## 2020-01-09 DIAGNOSIS — R54 Age-related physical debility: Secondary | ICD-10-CM | POA: Diagnosis present

## 2020-01-09 DIAGNOSIS — Z681 Body mass index (BMI) 19 or less, adult: Secondary | ICD-10-CM | POA: Diagnosis not present

## 2020-01-09 DIAGNOSIS — Z7982 Long term (current) use of aspirin: Secondary | ICD-10-CM | POA: Diagnosis not present

## 2020-01-09 DIAGNOSIS — K5791 Diverticulosis of intestine, part unspecified, without perforation or abscess with bleeding: Secondary | ICD-10-CM | POA: Diagnosis not present

## 2020-01-09 DIAGNOSIS — I5032 Chronic diastolic (congestive) heart failure: Secondary | ICD-10-CM | POA: Diagnosis present

## 2020-01-09 DIAGNOSIS — Z87891 Personal history of nicotine dependence: Secondary | ICD-10-CM | POA: Diagnosis not present

## 2020-01-09 DIAGNOSIS — I350 Nonrheumatic aortic (valve) stenosis: Secondary | ICD-10-CM | POA: Diagnosis present

## 2020-01-09 DIAGNOSIS — N183 Chronic kidney disease, stage 3 unspecified: Secondary | ICD-10-CM | POA: Diagnosis present

## 2020-01-09 DIAGNOSIS — K922 Gastrointestinal hemorrhage, unspecified: Secondary | ICD-10-CM | POA: Diagnosis not present

## 2020-01-09 DIAGNOSIS — N189 Chronic kidney disease, unspecified: Secondary | ICD-10-CM | POA: Diagnosis not present

## 2020-01-09 DIAGNOSIS — E43 Unspecified severe protein-calorie malnutrition: Secondary | ICD-10-CM | POA: Diagnosis present

## 2020-01-09 DIAGNOSIS — I10 Essential (primary) hypertension: Secondary | ICD-10-CM

## 2020-01-09 DIAGNOSIS — Z66 Do not resuscitate: Secondary | ICD-10-CM

## 2020-01-09 DIAGNOSIS — D62 Acute posthemorrhagic anemia: Secondary | ICD-10-CM | POA: Diagnosis present

## 2020-01-09 DIAGNOSIS — K5731 Diverticulosis of large intestine without perforation or abscess with bleeding: Secondary | ICD-10-CM | POA: Diagnosis present

## 2020-01-09 DIAGNOSIS — J309 Allergic rhinitis, unspecified: Secondary | ICD-10-CM | POA: Diagnosis present

## 2020-01-09 DIAGNOSIS — Z79899 Other long term (current) drug therapy: Secondary | ICD-10-CM | POA: Diagnosis not present

## 2020-01-09 DIAGNOSIS — J439 Emphysema, unspecified: Secondary | ICD-10-CM | POA: Diagnosis present

## 2020-01-09 DIAGNOSIS — Z20822 Contact with and (suspected) exposure to covid-19: Secondary | ICD-10-CM | POA: Diagnosis present

## 2020-01-09 DIAGNOSIS — Z7951 Long term (current) use of inhaled steroids: Secondary | ICD-10-CM | POA: Diagnosis not present

## 2020-01-09 DIAGNOSIS — I130 Hypertensive heart and chronic kidney disease with heart failure and stage 1 through stage 4 chronic kidney disease, or unspecified chronic kidney disease: Secondary | ICD-10-CM | POA: Diagnosis present

## 2020-01-09 DIAGNOSIS — I2510 Atherosclerotic heart disease of native coronary artery without angina pectoris: Secondary | ICD-10-CM | POA: Diagnosis present

## 2020-01-09 HISTORY — DX: Gastrointestinal hemorrhage, unspecified: K92.2

## 2020-01-09 LAB — CBC
HCT: 32.5 % — ABNORMAL LOW (ref 36.0–46.0)
Hemoglobin: 10.1 g/dL — ABNORMAL LOW (ref 12.0–15.0)
MCH: 32.6 pg (ref 26.0–34.0)
MCHC: 31.1 g/dL (ref 30.0–36.0)
MCV: 104.8 fL — ABNORMAL HIGH (ref 80.0–100.0)
Platelets: 255 10*3/uL (ref 150–400)
RBC: 3.1 MIL/uL — ABNORMAL LOW (ref 3.87–5.11)
RDW: 13.6 % (ref 11.5–15.5)
WBC: 6 10*3/uL (ref 4.0–10.5)
nRBC: 0 % (ref 0.0–0.2)

## 2020-01-09 LAB — RESPIRATORY PANEL BY RT PCR (FLU A&B, COVID)
Influenza A by PCR: NEGATIVE
Influenza B by PCR: NEGATIVE
SARS Coronavirus 2 by RT PCR: NEGATIVE

## 2020-01-09 LAB — POC SARS CORONAVIRUS 2 AG -  ED: SARS Coronavirus 2 Ag: POSITIVE — AB

## 2020-01-09 LAB — POC OCCULT BLOOD, ED: Fecal Occult Bld: POSITIVE — AB

## 2020-01-09 LAB — TYPE AND SCREEN
ABO/RH(D): O POS
Antibody Screen: NEGATIVE

## 2020-01-09 LAB — HEMOGLOBIN AND HEMATOCRIT, BLOOD
HCT: 28.7 % — ABNORMAL LOW (ref 36.0–46.0)
Hemoglobin: 9.2 g/dL — ABNORMAL LOW (ref 12.0–15.0)

## 2020-01-09 LAB — ABO/RH: ABO/RH(D): O POS

## 2020-01-09 MED ORDER — ACETAMINOPHEN 650 MG RE SUPP: 650.0000 mg | Freq: Four times a day (QID) | RECTAL | Status: DC | PRN

## 2020-01-09 MED ORDER — ONDANSETRON HCL 4 MG/2ML IJ SOLN: 4.0000 mg | Freq: Four times a day (QID) | INTRAMUSCULAR | Status: DC | PRN

## 2020-01-09 MED ORDER — ONDANSETRON HCL 4 MG PO TABS: 4.0000 mg | ORAL_TABLET | Freq: Four times a day (QID) | ORAL | Status: DC | PRN

## 2020-01-09 MED ORDER — TIOTROPIUM BROMIDE MONOHYDRATE 2.5 MCG/ACT IN AERS: 2.0000 | INHALATION_SPRAY | Freq: Every day | RESPIRATORY_TRACT | Status: DC

## 2020-01-09 MED ORDER — SODIUM CHLORIDE 0.9% FLUSH
3.0000 mL | Freq: Two times a day (BID) | INTRAVENOUS | Status: DC
  Administered 2020-01-10 – 2020-01-12 (×2): 3 mL via INTRAVENOUS

## 2020-01-09 MED ORDER — SODIUM CHLORIDE 0.9 % IV SOLN: INTRAVENOUS | Status: DC

## 2020-01-09 MED ORDER — UMECLIDINIUM BROMIDE 62.5 MCG/INH IN AEPB
1.0000 | INHALATION_SPRAY | Freq: Every day | RESPIRATORY_TRACT | Status: DC
  Administered 2020-01-12: 1 via RESPIRATORY_TRACT
  Filled 2020-01-09: qty 7

## 2020-01-09 MED ORDER — UMECLIDINIUM BROMIDE 62.5 MCG/INH IN AEPB
1.0000 | INHALATION_SPRAY | Freq: Every day | RESPIRATORY_TRACT | Status: DC
  Filled 2020-01-09: qty 7

## 2020-01-09 MED ORDER — ACETAMINOPHEN 325 MG PO TABS: 650.0000 mg | ORAL_TABLET | Freq: Four times a day (QID) | ORAL | Status: DC | PRN

## 2020-01-09 MED ORDER — AMLODIPINE BESYLATE 2.5 MG PO TABS
2.5000 mg | ORAL_TABLET | Freq: Every day | ORAL | Status: DC
  Administered 2020-01-11: 2.5 mg via ORAL
  Filled 2020-01-09 (×2): qty 1

## 2020-01-09 MED ORDER — LEVALBUTEROL HCL 0.63 MG/3ML IN NEBU: 0.6300 mg | INHALATION_SOLUTION | Freq: Three times a day (TID) | RESPIRATORY_TRACT | Status: DC | PRN

## 2020-01-09 NOTE — H&P (Signed)
History and Physical    Desiree Hutchinson KKX:[id number] DOB: [birth year] DOA: 01/08/2020   Referring MD/NP/PA: Terance Hart, PA-C PCP: Avanell Shackleton, NP-C  Patient coming from: Home   Chief Complaint: bloody stools  I have personally briefly reviewed patient's old medical records in [facility]   HPI: Desiree Hutchinson is a 86 y.o. female with medical history significant of hypertension, chronic diastolic CHF, anemia, COPD, SBO, GI bleed requiring embolization in 2017.  She presents with complaints of bloody stools over the last 3 days.  Patient has had 2 or 3 bloody bowel movements with clots present.  The granddaughter notes that she is on a 81 mg aspirin daily, but is not on any other blood thinners.  She has been taking MiraLAX for issues with constipation.  Associated symptoms include significant weakness and chills.  Patient denies any fever, chest pain, shortness of breath, abdominal pain, nausea, vomiting, or diarrhea symptoms.  Patient previously had been in ICU back in 2017 with a GI bleed requiring mesentery angiogram with embolization of the ileocecal artery by IR   ED Course: Upon admission into the emergency department patient was noted to be stable.  Fecal occult blood samples were noted to be positive, but had initially been put and as a point-of-care positive COVID-19.  Influenza and COVID-19 screening was noted to be negative.  Labs significant for hemoglobin initially 9.8->10.1, creatinine 1.36, and BUN 24.  Gastroenterology was formally consulted  Review of Systems  Constitutional: Positive for chills and malaise/fatigue. Negative for fever.  HENT: Negative for congestion and nosebleeds.   Eyes: Negative for photophobia and pain.  Respiratory: Negative for cough and shortness of breath.   Cardiovascular: Negative for chest pain and leg swelling.  Gastrointestinal: Positive for blood in stool. Negative for abdominal pain, nausea and vomiting.  Genitourinary: Negative  for dysuria and hematuria.  Musculoskeletal: Negative for falls.  Neurological: Negative for focal weakness and loss of consciousness.  Psychiatric/Behavioral: Negative for substance abuse. The patient is not nervous/anxious.     Past Medical History:  Diagnosis Date  . Allergic rhinitis   . Anemia   . Aortic valve disease    regurge > stenosis  . Chest pain, non-cardiac   . Chronic diastolic heart failure (HCC)   . COPD (chronic obstructive pulmonary disease) (HCC)   . Cough   . Gastritis   . History of atherosclerotic cardiovascular disease   . History of small bowel obstruction   . Hypertension   . Left ventricular hypertrophy   . Obesity   . Personal history of tobacco use, presenting hazards to health     Past Surgical History:  Procedure Laterality Date  . adenoasine cardiolite  11/09/2004  . APPENDECTOMY    . BOWEL RESECTION N/A 08/10/2015   Procedure: SMALL BOWEL OBSTRUCTION;  Surgeon: Glenna Fellows, MD;  Location: [facility];  Service: General;  Laterality: N/A;  . ESOPHAGOGASTRODUODENOSCOPY  11/09/1999  . HERNIA REPAIR    . IR GENERIC HISTORICAL  09/12/2016   IR FLUORO GUIDE CV LINE RIGHT 09/12/2016 Malachy Moan, MD [facility]  . IR GENERIC HISTORICAL  09/12/2016   IR US GUIDE VASC ACCESS RIGHT 09/12/2016 Malachy Moan, MD [facility]  . IR GENERIC HISTORICAL  09/12/2016   IR ANGIOGRAM SELECTIVE EACH ADDITIONAL VESSEL 09/12/2016 Malachy Moan, MD [facility]  . IR GENERIC HISTORICAL  09/12/2016   IR ANGIOGRAM VISCERAL SELECTIVE 09/12/2016 Malachy Moan, MD [facility]  . IR GENERIC HISTORICAL  09/12/2016  IR ANGIOGRAM SELECTIVE EACH ADDITIONAL VESSEL 09/12/2016 Malachy Moan, MD [facility]  . IR GENERIC HISTORICAL  09/12/2016   IR ANGIOGRAM SELECTIVE EACH ADDITIONAL VESSEL 09/12/2016 Malachy Moan, MD [facility]  . IR GENERIC HISTORICAL  09/12/2016   IR US GUIDE VASC ACCESS RIGHT 09/12/2016 Malachy Moan, MD [facility]  . IR GENERIC  HISTORICAL  09/12/2016   IR ANGIOGRAM SELECTIVE EACH ADDITIONAL VESSEL 09/12/2016 Malachy Moan, MD [facility]  . IR GENERIC HISTORICAL  09/12/2016   IR EMBO ART  VEN HEMORR LYMPH EXTRAV  INC GUIDE ROADMAPPING 09/12/2016 Malachy Moan, MD [facility]  . IR GENERIC HISTORICAL  09/12/2016   IR ANGIOGRAM SELECTIVE EACH ADDITIONAL VESSEL 09/12/2016 Malachy Moan, MD [facility]  . LAPAROTOMY  08/10/2015   Procedure: EXPLORATORY LAPAROTOMY;  Surgeon: Glenna Fellows, MD;  Location: [facility];  Service: General;;  . LYSIS OF ADHESION  08/10/2015   Procedure: LYSIS OF ADHESION;  Surgeon: Glenna Fellows, MD;  Location: [facility];  Service: General;;  . small bowel obstruction  06/1996  . transthoracic cardiolite   11/03/2006     reports that she quit smoking about 9 years ago. Her smoking use included cigarettes. She has a 3.00 pack-year smoking history. She has never used smokeless tobacco. She reports that she does not drink alcohol or use drugs.  No Known Allergies  No family history on file.  Prior to Admission medications   Medication Sig Start Date End Date Taking? Authorizing Provider  amLODipine (NORVASC) 2.5 MG tablet TAKE 1 TABLET (2.5 MG TOTAL) BY MOUTH DAILY. 02/28/19  Yes Tonny Bollman, MD  aspirin EC 81 MG tablet Take 81 mg by mouth daily.   Yes [provider]  cholecalciferol (VITAMIN D) 400 units TABS tablet Take 400 Units by mouth.   Yes [provider]  polyethylene glycol powder (GLYCOLAX/MIRALAX) 17 GM/SCOOP powder Take 17 g by mouth daily. 12/17/19  Yes Henson, Vickie L, NP-C  SPIRIVA RESPIMAT 2.5 MCG/ACT AERS INHALE 2 PUFFS BY MOUTH INTO THE LUNGS DAILY Patient taking differently: Inhale 2 puffs into the lungs at bedtime.  12/18/19  Yes Mannam, Colbert Coyer, MD    Physical Exam:  Constitutional: Elderly female who appears to be in no acute distress at this time. Vitals:   01/09/20 0900 01/09/20 0915 01/09/20 0930 01/09/20 0945  BP: 131/60 (!) 135/59  125/62 114/61  Pulse: 91 85 83 90  Resp:      Temp:      TempSrc:      SpO2: 100% 100% 100% 100%   Eyes: PERRL, lids and conjunctivae normal ENMT: Mucous membranes are moist. Posterior pharynx clear of any exudate or lesions.  Neck: normal, supple, no masses, no thyromegaly Respiratory: clear to auscultation bilaterally, no wheezing, no crackles. Normal respiratory effort. No accessory muscle use.  Cardiovascular: Regular rate and rhythm, no murmurs / rubs / gallops. No extremity edema. 2+ pedal pulses. No carotid bruits.  Abdomen: no tenderness, no masses palpated. No hepatosplenomegaly. Bowel sounds positive.  Musculoskeletal: no clubbing / cyanosis. No joint deformity upper and lower extremities. Good ROM, no contractures. Normal muscle tone.  Skin: no rashes, lesions, ulcers. No induration Neurologic: CN 2-12 grossly intact. Sensation intact, DTR normal. Strength 4+/5 in all 4.  Psychiatric: Normal judgment and insight. Alert and oriented x 3. Normal mood.     Labs on Admission: I have personally reviewed following labs and imaging studies  CBC: Recent Labs  Lab 01/08/20 2014 01/09/20 0807  WBC 4.8 6.0  NEUTROABS 2.4  --  HGB 9.8* 10.1*  HCT 30.9* 32.5*  MCV 105.1* 104.8*  PLT 251 102   Basic Metabolic Panel: Recent Labs  Lab 01/08/20 2014  NA 136  K 4.4  CL 106  CO2 20*  GLUCOSE 166*  BUN 24*  CREATININE 1.36*  CALCIUM 8.8*   GFR: CrCl cannot be calculated (Unknown ideal weight.). Liver Function Tests: Recent Labs  Lab 01/08/20 2014  AST 21  ALT 9  ALKPHOS 75  BILITOT 0.6  PROT 6.1*  ALBUMIN 3.1*   No results for input(s): LIPASE, AMYLASE in the last 168 hours. No results for input(s): AMMONIA in the last 168 hours. Coagulation Profile: Recent Labs  Lab 01/08/20 2014  INR 1.1   Cardiac Enzymes: No results for input(s): CKTOTAL, CKMB, CKMBINDEX, TROPONINI in the last 168 hours. BNP (last 3 results) No results for input(s): PROBNP in the last  8760 hours. HbA1C: No results for input(s): HGBA1C in the last 72 hours. CBG: No results for input(s): GLUCAP in the last 168 hours. Lipid Profile: No results for input(s): CHOL, HDL, LDLCALC, TRIG, CHOLHDL, LDLDIRECT in the last 72 hours. Thyroid Function Tests: No results for input(s): TSH, T4TOTAL, FREET4, T3FREE, THYROIDAB in the last 72 hours. Anemia Panel: No results for input(s): VITAMINB12, FOLATE, FERRITIN, TIBC, IRON, RETICCTPCT in the last 72 hours. Urine analysis:    Component Value Date/Time   COLORURINE YELLOW 09/30/2016 1617   APPEARANCEUR SL CLOUDY (A) 09/30/2016 1617   LABSPEC 1.015 09/30/2016 1617   PHURINE 6.0 09/30/2016 1617   GLUCOSEU NEGATIVE 09/30/2016 1617   HGBUR TRACE-INTACT (A) 09/30/2016 1617   BILIRUBINUR NEGATIVE 09/30/2016 1617   KETONESUR NEGATIVE 09/30/2016 1617   PROTEINUR NEGATIVE 08/18/2015 1109   UROBILINOGEN 1.0 09/30/2016 1617   NITRITE POSITIVE (A) 09/30/2016 1617   LEUKOCYTESUR TRACE (A) 09/30/2016 1617   Sepsis Labs: Recent Results (from the past 240 hour(s))  Respiratory Panel by RT PCR (Flu A&B, Covid) - Nasopharyngeal Swab     Status: None   Collection Time: 01/09/20  8:07 AM   Specimen: Nasopharyngeal Swab  Result Value Ref Range Status   SARS Coronavirus 2 by RT PCR NEGATIVE NEGATIVE Final    Comment: (NOTE) SARS-CoV-2 target nucleic acids are NOT DETECTED. The SARS-CoV-2 RNA is generally detectable in upper respiratoy specimens during the acute phase of infection. The lowest concentration of SARS-CoV-2 viral copies this assay can detect is 131 copies/mL. A negative result does not preclude SARS-Cov-2 infection and should not be used as the sole basis for treatment or other patient management decisions. A negative result may occur with  improper specimen collection/handling, submission of specimen other than nasopharyngeal swab, presence of viral mutation(s) within the areas targeted by this assay, and inadequate number of  viral copies (<131 copies/mL). A negative result must be combined with clinical observations, patient history, and epidemiological information. The expected result is Negative. Fact Sheet for Patients:  PinkCheek.be Fact Sheet for Healthcare Providers:  GravelBags.it This test is not yet ap proved or cleared by the [place] FDA and  has been authorized for detection and/or diagnosis of SARS-CoV-2 by FDA under an Emergency Use Authorization (EUA). This EUA will remain  in effect (meaning this test can be used) for the duration of the COVID-19 declaration under Section 564(b)(1) of the Act, 21 U.S.C. section 360bbb-3(b)(1), unless the authorization is terminated or revoked sooner.    Influenza A by PCR NEGATIVE NEGATIVE Final   Influenza B by PCR NEGATIVE NEGATIVE Final    Comment: (NOTE)  The Xpert Xpress SARS-CoV-2/FLU/RSV assay is intended as an aid in  the diagnosis of influenza from Nasopharyngeal swab specimens and  should not be used as a sole basis for treatment. Nasal washings and  aspirates are unacceptable for Xpert Xpress SARS-CoV-2/FLU/RSV  testing. Fact Sheet for Patients: https://www.moore.com/ Fact Sheet for Healthcare Providers: https://www.young.biz/ This test is not yet approved or cleared by the [place] FDA and  has been authorized for detection and/or diagnosis of SARS-CoV-2 by  FDA under an Emergency Use Authorization (EUA). This EUA will remain  in effect (meaning this test can be used) for the duration of the  Covid-19 declaration under Section 564(b)(1) of the Act, 21  U.S.C. section 360bbb-3(b)(1), unless the authorization is  terminated or revoked. Performed at [facility], [address] [place], [place] [address]      Radiological Exams on Admission: No results found.  EKG: Independently reviewed.  Sinus rhythm at 84 bpm with  significant background artifact  Assessment/Plan Acute blood loss anemia secondary to lower GI bleed: Patient presents with complaints of blood in stools and hemoglobin 9.8.  Stool guaiacs were noted to be positive.  Hemoglobin previously noted to be 12.4 on 12/17/19.  Patient with previous bleed requiring embolization by IR in 2017. -Admit to a medsurg bed -Hold aspirin -Soft diet for now -Continue to monitor H&H -Transfuse blood products as needed for symptomatic anemia or hemoglobin less than 8. -Appreciate GI consultative services, will follow-up for further recommendation   Acute kidney injury superimposed on chronic kidney disease stage III: Patient's baseline creatinine previously noted to be around 1.01 in 11/2019.  She presents with creatinine elevated up to 1.36 with a BUN 24.  In the setting of acute blood loss anemia suspect prerenal cause of symptoms. -Gentle IV fluids of 50 mL/h  -Recheck kidney function in a.m.  Diastolic diastolic congestive heart failure: Patient appears to be euvolemic at this time.  Last EF noted to be 60- 65% by echocardiogram in 01/2019. -Monitor intake and output -Daily weights  Essential hypertension: Blood pressure currently stable. -Continue amlodipine   COPD, without acute exacerbation: Patient was noted not to be positive for COVID-19. -Continue Spiriva  DO NOT RESUSCITATE: Present on admission  DVT prophylaxis: SCDs Code Status: DNR Family Communication: Discussed plan of care with the patient's granddaughter over the phone Disposition Plan: Possible discharge home in 1 to 2 days. Consults called: Gastroenterology Admission status: Inpatient  Clydie Braun MD [facility] Pager [phone]   If 7PM-7AM, please contact night-coverage [email or web address] Password [email or web address]  01/09/2020, 10:34 AM

## 2020-01-09 NOTE — ED Provider Notes (Signed)
[facility] Provider Note   CSN: [id number] Arrival date & time: 01/08/20  1943     History Chief Complaint  Patient presents with  . Bloody Stools    Desiree Hutchinson is a 86 y.o. female with history of CHF, COPD, hx of lower GI bleed who presents with bloody stools. She is accompanied by her granddaughter. She states that for the past 3 days her grandmother has been telling her she's seen blood with her bowel movements. Over the past day the bleeding has become much worse and she has had two episodes of passing large red clots so they decided to come to the ED. They have been waiting 12 hours and had to change her depends in the waiting room and her granddaughter noted there were large clots. She takes an ASA daily but not blood thinners. She has issues with constipation and was told by her PCP to take Miralax daily. She denies fever, lightheadedness, chest pain, SOB, abdominal pain, rectal pain. She has a history of lower GIB in 2017 and stayed in the ICU at that time. She had a mesenteric angiogram and embolization of actively bleeding branch of the ileocecal artery by IR.   HPI     Past Medical History:  Diagnosis Date  . Allergic rhinitis   . Anemia   . Aortic valve disease    regurge > stenosis  . Chest pain, non-cardiac   . Chronic diastolic heart failure ([place])   . COPD (chronic obstructive pulmonary disease) ([place])   . Cough   . Gastritis   . History of atherosclerotic cardiovascular disease   . History of small bowel obstruction   . Hypertension   . Left ventricular hypertrophy   . Obesity   . Personal history of tobacco use, presenting hazards to health     Patient Active Problem List   Diagnosis Date Noted  . Asymptomatic menopausal state 06/20/2017  . Hypocalcemia 09/30/2016  . UTI (urinary tract infection) 09/30/2016  . Hematochezia   . Leukocytopenia   . Thrombocytopenia ([place])   . Gastrointestinal hemorrhage 09/12/2016   . Acute GI bleeding 09/12/2016  . Hemorrhagic shock ([place])   . PAD (peripheral artery disease) ([place]) 04/06/2016  . Iron deficiency anemia 02/13/2016  . Urinary incontinence 09/26/2015  . Chronic diastolic heart failure ([place]) 08/20/2015  . SBO (small bowel obstruction) ([place]) 08/06/2015  . AKI (acute kidney injury) ([place]) 08/06/2015  . Protein-calorie malnutrition ([place]) 07/21/2015  . Lower GI bleed 07/19/2015  . Diverticulosis of colon with hemorrhage 07/19/2015  . Acute blood loss anemia 07/19/2015  . Constipation 01/10/2015  . Loss of weight 01/10/2015  . Memory loss of 01/10/2015  . Hearing loss 01/08/2014  . Renal insufficiency 03/27/2013  . Encounter for long-term (current) use of other medications 03/27/2013  . Aortic valve disorder 06/15/2010  . CHRONIC RHINITIS 06/26/2009  . SHORTNESS OF BREATH 05/29/2009  . OTHER DYSPNEA AND RESPIRATORY ABNORMALITIES 05/29/2009  . PRECORDIAL PAIN 05/29/2009  . COPD with emphysema Gold Stage A 05/28/2009  . SMALL BOWEL OBSTRUCTION, HX OF 05/28/2009  . GASTRITIS 03/28/2008  . COUGH 12/07/2007  . Essential hypertension 10/14/2007  . Hypotension 10/14/2007  . VENTRICULAR HYPERTROPHY, LEFT 10/14/2007  . ALLERGIC RHINITIS 10/14/2007  . CHEST PAIN, NON-CARDIAC 10/14/2007  . TOBACCO USE, QUIT 10/14/2007    Past Surgical History:  Procedure Laterality Date  . adenoasine cardiolite  11/09/2004  . APPENDECTOMY    . BOWEL RESECTION N/A 08/10/2015   Procedure: SMALL  BOWEL OBSTRUCTION;  Surgeon: Glenna Fellows, MD;  Location: [facility];  Service: General;  Laterality: N/A;  . ESOPHAGOGASTRODUODENOSCOPY  11/09/1999  . HERNIA REPAIR    . IR GENERIC HISTORICAL  09/12/2016   IR FLUORO GUIDE CV LINE RIGHT 09/12/2016 Malachy Moan, MD [facility]  . IR GENERIC HISTORICAL  09/12/2016   IR US GUIDE VASC ACCESS RIGHT 09/12/2016 Malachy Moan, MD [facility]  . IR GENERIC HISTORICAL  09/12/2016   IR ANGIOGRAM SELECTIVE EACH ADDITIONAL VESSEL  09/12/2016 Malachy Moan, MD [facility]  . IR GENERIC HISTORICAL  09/12/2016   IR ANGIOGRAM VISCERAL SELECTIVE 09/12/2016 Malachy Moan, MD [facility]  . IR GENERIC HISTORICAL  09/12/2016   IR ANGIOGRAM SELECTIVE EACH ADDITIONAL VESSEL 09/12/2016 Malachy Moan, MD [facility]  . IR GENERIC HISTORICAL  09/12/2016   IR ANGIOGRAM SELECTIVE EACH ADDITIONAL VESSEL 09/12/2016 Malachy Moan, MD [facility]  . IR GENERIC HISTORICAL  09/12/2016   IR US GUIDE VASC ACCESS RIGHT 09/12/2016 Malachy Moan, MD [facility]  . IR GENERIC HISTORICAL  09/12/2016   IR ANGIOGRAM SELECTIVE EACH ADDITIONAL VESSEL 09/12/2016 Malachy Moan, MD [facility]  . IR GENERIC HISTORICAL  09/12/2016   IR EMBO ART  VEN HEMORR LYMPH EXTRAV  INC GUIDE ROADMAPPING 09/12/2016 Malachy Moan, MD [facility]  . IR GENERIC HISTORICAL  09/12/2016   IR ANGIOGRAM SELECTIVE EACH ADDITIONAL VESSEL 09/12/2016 Malachy Moan, MD [facility]  . LAPAROTOMY  08/10/2015   Procedure: EXPLORATORY LAPAROTOMY;  Surgeon: Glenna Fellows, MD;  Location: [facility];  Service: General;;  . LYSIS OF ADHESION  08/10/2015   Procedure: LYSIS OF ADHESION;  Surgeon: Glenna Fellows, MD;  Location: [facility];  Service: General;;  . small bowel obstruction  06/1996  . transthoracic cardiolite   11/03/2006     OB History   No obstetric history on file.     No family history on file.  Social History   Tobacco Use  . Smoking status: Former Smoker    Packs/day: 0.10    Years: 30.00    Pack years: 3.00    Types: Cigarettes    Quit date: 01/24/2010    Years since quitting: 9.9  . Smokeless tobacco: Never Used  . Tobacco comment: smoked 1-2 cigarettes/day  Substance Use Topics  . Alcohol use: No    Alcohol/week: 0.0 standard drinks  . Drug use: No    Home Medications Prior to Admission medications   Medication Sig Start Date End Date Taking? Authorizing Provider  amLODipine (NORVASC) 2.5 MG tablet TAKE 1 TABLET (2.5 MG  TOTAL) BY MOUTH DAILY. 02/28/19   Tonny Bollman, MD  aspirin EC 81 MG tablet Take 81 mg by mouth daily.    [provider]  cholecalciferol (VITAMIN D) 400 units TABS tablet Take 400 Units by mouth.    [provider]  polyethylene glycol powder (GLYCOLAX/MIRALAX) 17 GM/SCOOP powder Take 17 g by mouth daily. 12/17/19   Henson, Vickie L, NP-C  SPIRIVA RESPIMAT 2.5 MCG/ACT AERS INHALE 2 PUFFS BY MOUTH INTO THE LUNGS DAILY 12/18/19   Chilton Greathouse, MD    Allergies    Patient has no known allergies.  Review of Systems   Review of Systems  Constitutional: Negative for fever.  Respiratory: Negative for shortness of breath.   Cardiovascular: Negative for chest pain.  Gastrointestinal: Positive for blood in stool and constipation. Negative for abdominal pain, nausea and vomiting.  Neurological: Negative for syncope and light-headedness.  All other systems reviewed and are negative.  Physical Exam Updated Vital Signs BP 115/68   Pulse 100   Temp 98.3 F (36.8 C) (Oral)   Resp 16   SpO2 100%   Physical Exam Vitals and nursing note reviewed.  Constitutional:      General: She is not in acute distress.    Appearance: Normal appearance. She is well-developed. She is not ill-appearing.     Comments: Frail elderly female in NAD. Calm and cooperative.  HENT:     Head: Normocephalic and atraumatic.  Eyes:     General: No scleral icterus.       Right eye: No discharge.        Left eye: No discharge.     Conjunctiva/sclera: Conjunctivae normal.     Pupils: Pupils are equal, round, and reactive to light.  Cardiovascular:     Rate and Rhythm: Normal rate and regular rhythm.  Pulmonary:     Effort: Pulmonary effort is normal. No respiratory distress.     Breath sounds: Normal breath sounds.  Abdominal:     General: There is no distension.     Palpations: Abdomen is soft.     Tenderness: There is no abdominal tenderness.  Genitourinary:    Comments: Rectum:  Non-thrombosed and non-bleeding external hemorrhoid noted. Gross red blood with digital exam Musculoskeletal:     Cervical back: Normal range of motion.  Skin:    General: Skin is warm and dry.  Neurological:     Mental Status: She is alert and oriented to person, place, and time.  Psychiatric:        Behavior: Behavior normal.     ED Results / Procedures / Treatments   Labs (all labs ordered are listed, but only abnormal results are displayed) Labs Reviewed  CBC WITH DIFFERENTIAL/PLATELET - Abnormal; Notable for the following components:      Result Value   RBC 2.94 (*)    Hemoglobin 9.8 (*)    HCT 30.9 (*)    MCV 105.1 (*)    All other components within normal limits  COMPREHENSIVE METABOLIC PANEL - Abnormal; Notable for the following components:   CO2 20 (*)    Glucose, Bld 166 (*)    BUN 24 (*)    Creatinine, Ser 1.36 (*)    Calcium 8.8 (*)    Total Protein 6.1 (*)    Albumin 3.1 (*)    GFR calc non Af Amer 33 (*)    GFR calc Af Amer 38 (*)    All other components within normal limits  CBC - Abnormal; Notable for the following components:   RBC 3.10 (*)    Hemoglobin 10.1 (*)    HCT 32.5 (*)    MCV 104.8 (*)    All other components within normal limits  POC OCCULT BLOOD, ED - Abnormal; Notable for the following components:   Fecal Occult Bld POSITIVE (*)    All other components within normal limits  POC SARS CORONAVIRUS 2 AG -  ED - Abnormal; Notable for the following components:   SARS Coronavirus 2 Ag POSITIVE (*)    All other components within normal limits  RESPIRATORY PANEL BY RT PCR (FLU A&B, COVID)  PROTIME-INR  SAMPLE TO BLOOD BANK  TYPE AND SCREEN  ABO/RH    EKG None  Radiology No results found.  Procedures Procedures (including critical care time)  Medications Ordered in ED Medications - No data to display  ED Course  I have reviewed the triage vital signs and the nursing  notes.  Pertinent labs & imaging results that were available  during my care of the patient were reviewed by me and considered in my medical decision making (see chart for details).  86 year old female presents with acute lower GI blood. She is mildly tachycardic on arrival but this has resolved on recheck. She is hemodynamically stable. Abdomen is soft, non-tender. Rectal exam is remarkable for gross bright red blood - suspect lower GI bleed. CBC is remarkable for drop in hgb from 12.4 on 12/21 to 9.8 today. CMP is remarkable for mildly low bicarb (20), elevated SCr (1.3). Corrected Ca is normal. Albumin low (3.1). Since she has been waiting for 12 hours at this point will repeat CBC. Shared visit with Dr. Anitra Lauth. Will discuss with GI.  GI will come to see. Repeat CBC shows stable hgb. Of note, when hemoccult was obtained a positive COVID test was entered rather than a positive hemoccult - lab is working on correcting this error. Pt does not have any COVID symptoms and a PCR COVID test was sent out and is negative.  Discussed with Dr. Katrinka Blazing with [facility] who will admit.  MDM Rules/Calculators/A&P                       Final Clinical Impression(s) / ED Diagnoses Final diagnoses:  Gastrointestinal hemorrhage, unspecified gastrointestinal hemorrhage type  Chronic kidney disease, unspecified CKD stage    Rx / DC Orders ED Discharge Orders    None       Bethel Born, PA-C 01/09/20 1039    Gwyneth Sprout, MD 01/09/20 1120

## 2020-01-09 NOTE — ED Notes (Signed)
Dr. Smith at bedside.

## 2020-01-09 NOTE — Progress Notes (Signed)
Patient's granddaughter expressed concerns that she is not comfortable with the patient coming home until the source of her bleed is found.

## 2020-01-09 NOTE — Consult Note (Signed)
Referring Provider:  Dr. Maryan Rued, EDP Primary Care Physician:  Girtha Rm, NP-C Primary Gastroenterologist:  Dr. Henrene Pastor  Reason for Consultation:  GI bleed  HPI: Desiree Hutchinson is a 86 y.o. female with PMH of HTN, SBO, and history of large GI bleed in 08/2016 at which time bleeding scan indicated active bleeding in the cecum/ascending colon.  At that time she had hypotensive shock requiring pressors and 5 units of blood.  She underwent coil embolization of actively bleeding artery in proximal to mid-ascending colon.  Has known colonic diverticulosis so was presumed to be a diverticular bleed.    She was brought in by her granddaughter overnight with complaints of rectal bleeding.  She says that it had been going on for about 3 days but worsened yesterday.  Started having clots.  No abdominal pain, nausea, vomiting. Says that he appetite is good and denies weight loss.  They waited in the ED for 12 hours last night and she had one episode of bleeding while in the waiting room, but none since then.  EDP documented gross red blood on digital exam.  Three weeks ago Hgb was 12.4 grams and here today is 9.8 grams.  Repeat then 10.0 grams.  Last colonoscopy was 2006 with Dr. Henrene Pastor with only diverticulosis.   Past Medical History:  Diagnosis Date  . Allergic rhinitis   . Anemia   . Aortic valve disease    regurge > stenosis  . Chest pain, non-cardiac   . Chronic diastolic heart failure ([place])   . COPD (chronic obstructive pulmonary disease) ([place])   . Cough   . Gastritis   . History of atherosclerotic cardiovascular disease   . History of small bowel obstruction   . Hypertension   . Left ventricular hypertrophy   . Obesity   . Personal history of tobacco use, presenting hazards to health     Past Surgical History:  Procedure Laterality Date  . adenoasine cardiolite  11/09/2004  . APPENDECTOMY    . BOWEL RESECTION N/A 08/10/2015   Procedure: SMALL BOWEL OBSTRUCTION;  Surgeon:  Excell Seltzer, MD;  Location: [facility];  Service: General;  Laterality: N/A;  . ESOPHAGOGASTRODUODENOSCOPY  11/09/1999  . HERNIA REPAIR    . IR GENERIC HISTORICAL  09/12/2016   IR FLUORO GUIDE CV LINE RIGHT 09/12/2016 Jacqulynn Cadet, MD [facility]  . IR GENERIC HISTORICAL  09/12/2016   IR US GUIDE VASC ACCESS RIGHT 09/12/2016 Jacqulynn Cadet, MD [facility]  . IR GENERIC HISTORICAL  09/12/2016   IR ANGIOGRAM SELECTIVE EACH ADDITIONAL VESSEL 09/12/2016 Jacqulynn Cadet, MD [facility]  . IR GENERIC HISTORICAL  09/12/2016   IR ANGIOGRAM VISCERAL SELECTIVE 09/12/2016 Jacqulynn Cadet, MD [facility]  . IR GENERIC HISTORICAL  09/12/2016   IR ANGIOGRAM SELECTIVE EACH ADDITIONAL VESSEL 09/12/2016 Jacqulynn Cadet, MD [facility]  . IR GENERIC HISTORICAL  09/12/2016   IR ANGIOGRAM SELECTIVE EACH ADDITIONAL VESSEL 09/12/2016 Jacqulynn Cadet, MD [facility]  . IR GENERIC HISTORICAL  09/12/2016   IR US GUIDE VASC ACCESS RIGHT 09/12/2016 Jacqulynn Cadet, MD [facility]  . IR GENERIC HISTORICAL  09/12/2016   IR ANGIOGRAM SELECTIVE EACH ADDITIONAL VESSEL 09/12/2016 Jacqulynn Cadet, MD [facility]  . IR GENERIC HISTORICAL  09/12/2016   IR EMBO ART  VEN HEMORR LYMPH EXTRAV  INC GUIDE ROADMAPPING 09/12/2016 Jacqulynn Cadet, MD [facility]  . IR GENERIC HISTORICAL  09/12/2016   IR ANGIOGRAM SELECTIVE EACH ADDITIONAL VESSEL 09/12/2016 Jacqulynn Cadet, MD [facility]  . LAPAROTOMY  08/10/2015   Procedure: EXPLORATORY LAPAROTOMY;  Surgeon: Glenna Fellows, MD;  Location: [facility];  Service: General;;  . LYSIS OF ADHESION  08/10/2015   Procedure: LYSIS OF ADHESION;  Surgeon: Glenna Fellows, MD;  Location: [facility];  Service: General;;  . small bowel obstruction  06/1996  . transthoracic cardiolite   11/03/2006    Prior to Admission medications   Medication Sig Start Date End Date Taking? Authorizing Provider  amLODipine (NORVASC) 2.5 MG tablet TAKE 1 TABLET (2.5 MG TOTAL) BY MOUTH DAILY. 02/28/19   Yes Tonny Bollman, MD  aspirin EC 81 MG tablet Take 81 mg by mouth daily.   Yes [provider]  cholecalciferol (VITAMIN D) 400 units TABS tablet Take 400 Units by mouth.   Yes [provider]  polyethylene glycol powder (GLYCOLAX/MIRALAX) 17 GM/SCOOP powder Take 17 g by mouth daily. 12/17/19  Yes Henson, Vickie L, NP-C  SPIRIVA RESPIMAT 2.5 MCG/ACT AERS INHALE 2 PUFFS BY MOUTH INTO THE LUNGS DAILY Patient taking differently: Inhale 2 puffs into the lungs at bedtime.  12/18/19  Yes Mannam, Praveen, MD    No current facility-administered medications for this encounter.   Current Outpatient Medications  Medication Sig Dispense Refill  . amLODipine (NORVASC) 2.5 MG tablet TAKE 1 TABLET (2.5 MG TOTAL) BY MOUTH DAILY. 90 tablet 3  . aspirin EC 81 MG tablet Take 81 mg by mouth daily.    . cholecalciferol (VITAMIN D) 400 units TABS tablet Take 400 Units by mouth.    . polyethylene glycol powder (GLYCOLAX/MIRALAX) 17 GM/SCOOP powder Take 17 g by mouth daily. 500 g 2  . SPIRIVA RESPIMAT 2.5 MCG/ACT AERS INHALE 2 PUFFS BY MOUTH INTO THE LUNGS DAILY (Patient taking differently: Inhale 2 puffs into the lungs at bedtime. ) 4 g 1    Allergies as of 01/08/2020  . (No Known Allergies)    No family history on file.  Social History   Socioeconomic History  . Marital status: Widowed    Spouse name: Not on file  . Number of children: Not on file  . Years of education: Not on file  . Highest education level: Not on file  Occupational History  . Not on file  Tobacco Use  . Smoking status: Former Smoker    Packs/day: 0.10    Years: 30.00    Pack years: 3.00    Types: Cigarettes    Quit date: 01/24/2010    Years since quitting: 9.9  . Smokeless tobacco: Never Used  . Tobacco comment: smoked 1-2 cigarettes/day  Substance and Sexual Activity  . Alcohol use: No    Alcohol/week: 0.0 standard drinks  . Drug use: No  . Sexual activity: Not Currently  Other Topics Concern  .  Not on file  Social History Narrative  . Not on file   Social Determinants of Health   Financial Resource Strain:   . Difficulty of Paying Living Expenses: Not on file  Food Insecurity:   . Worried About Programme researcher, broadcasting/film/video in the Last Year: Not on file  . Ran Out of Food in the Last Year: Not on file  Transportation Needs:   . Lack of Transportation (Medical): Not on file  . Lack of Transportation (Non-Medical): Not on file  Physical Activity:   . Days of Exercise per Week: Not on file  . Minutes of Exercise per Session: Not on file  Stress:   . Feeling of Stress : Not on file  Social Connections:   .  Frequency of Communication with Friends and Family: Not on file  . Frequency of Social Gatherings with Friends and Family: Not on file  . Attends Religious Services: Not on file  . Active Member of Clubs or Organizations: Not on file  . Attends Banker Meetings: Not on file  . Marital Status: Not on file  Intimate Partner Violence:   . Fear of Current or Ex-Partner: Not on file  . Emotionally Abused: Not on file  . Physically Abused: Not on file  . Sexually Abused: Not on file    Review of Systems: ROS is O/W negative except as mentioned in HPI.  Physical Exam: Vital signs in last 24 hours: Temp:  [97.8 F (36.6 C)-98.3 F (36.8 C)] 98.3 F (36.8 C) (01/13 0615) Pulse Rate:  [79-105] 95 (01/13 0845) Resp:  [16] 16 (01/13 0615) BP: (110-142)/(46-73) 131/61 (01/13 0845) SpO2:  [99 %-100 %] 99 % (01/13 0845)   General:  Alert, frail, elderly female, in NAD. Head:  Normocephalic and atraumatic. Eyes:  Sclera clear, no icterus.  Conjunctiva pink. Ears:  HOH. Mouth:  No deformity or lesions.   Lungs:  Clear throughout to auscultation.  No wheezes, crackles, or rhonchi.  Heart:  Regular rate and rhythm; no murmurs, clicks, rubs, or gallops. Abdomen:  Soft, non-distended.  BS present.  Non-tender. Rectal:  Gross blood on digital by EDP.  Msk:  Symmetrical  without gross deformities. Pulses:  Normal pulses noted. Extremities:  Without clubbing or edema. Neurologic:  Alert and oriented x 4;  grossly normal neurologically. Skin:  Intact without significant lesions or rashes. Psych:  Alert and cooperative. Normal mood and affect.  Lab Results: Recent Labs    01/08/20 2014  WBC 4.8  HGB 9.8*  HCT 30.9*  PLT 251   BMET Recent Labs    01/08/20 2014  NA 136  K 4.4  CL 106  CO2 20*  GLUCOSE 166*  BUN 24*  CREATININE 1.36*  CALCIUM 8.8*   LFT Recent Labs    01/08/20 2014  PROT 6.1*  ALBUMIN 3.1*  AST 21  ALT 9  ALKPHOS 75  BILITOT 0.6   PT/INR Recent Labs    01/08/20 2014  LABPROT 13.9  INR 1.1   IMPRESSION:  *86 year old female with lower GI bleed x 3 days, worsened yesterday with clots.  Has history of presumed right sided diverticular bleed in 08/2016 that required 5 units PRBC's and IR embolization.  Bleeding seems to have slowed at this point. *ABLA:  Hgb was 12.4 grams just 3 weeks ago and down to 9.8-->10.1 grams this AM.  PLAN: *Observe for now but would recommend NM bleeding scan if re-bleeds briskly.  Had some AKI so not sure CTA is the best choice right now. *Monitor Hgb and transfuse prn. *Will allow soft diet for now.   Princella Pellegrini. Ahlaya Ende  01/09/2020, 9:18 AM

## 2020-01-10 DIAGNOSIS — K5791 Diverticulosis of intestine, part unspecified, without perforation or abscess with bleeding: Secondary | ICD-10-CM

## 2020-01-10 LAB — CBC
HCT: 25.5 % — ABNORMAL LOW (ref 36.0–46.0)
HCT: 28.6 % — ABNORMAL LOW (ref 36.0–46.0)
Hemoglobin: 8.2 g/dL — ABNORMAL LOW (ref 12.0–15.0)
Hemoglobin: 9.1 g/dL — ABNORMAL LOW (ref 12.0–15.0)
MCH: 32.8 pg (ref 26.0–34.0)
MCH: 32.7 pg (ref 26.0–34.0)
MCHC: 32.2 g/dL (ref 30.0–36.0)
MCHC: 31.8 g/dL (ref 30.0–36.0)
MCV: 102 fL — ABNORMAL HIGH (ref 80.0–100.0)
MCV: 102.9 fL — ABNORMAL HIGH (ref 80.0–100.0)
Platelets: 230 10*3/uL (ref 150–400)
Platelets: 261 10*3/uL (ref 150–400)
RBC: 2.5 MIL/uL — ABNORMAL LOW (ref 3.87–5.11)
RBC: 2.78 MIL/uL — ABNORMAL LOW (ref 3.87–5.11)
RDW: 13.6 % (ref 11.5–15.5)
RDW: 13.6 % (ref 11.5–15.5)
WBC: 4.8 10*3/uL (ref 4.0–10.5)
WBC: 4.8 10*3/uL (ref 4.0–10.5)
nRBC: 0 % (ref 0.0–0.2)
nRBC: 0 % (ref 0.0–0.2)

## 2020-01-10 LAB — BASIC METABOLIC PANEL
Anion gap: 8 (ref 5–15)
BUN: 27 mg/dL — ABNORMAL HIGH (ref 8–23)
CO2: 22 mmol/L (ref 22–32)
Calcium: 9 mg/dL (ref 8.9–10.3)
Chloride: 114 mmol/L — ABNORMAL HIGH (ref 98–111)
Creatinine, Ser: 1.13 mg/dL — ABNORMAL HIGH (ref 0.44–1.00)
GFR calc Af Amer: 47 mL/min — ABNORMAL LOW (ref >60)
GFR calc non Af Amer: 41 mL/min — ABNORMAL LOW (ref >60)
Glucose, Bld: 92 mg/dL (ref 70–99)
Potassium: 4.8 mmol/L (ref 3.5–5.1)
Sodium: 144 mmol/L (ref 135–145)

## 2020-01-10 NOTE — Progress Notes (Addendum)
PROGRESS NOTE     PAULSEN  GYK:[id number] DOB: 05-28-1920 DOA: 01/08/2020 PCP: Avanell Shackleton, NP-C    Brief Narrative:  Desiree Hutchinson is a 86 y.o. female with medical history significant of hypertension, chronic diastolic CHF, anemia, COPD, SBO, GI bleed requiring embolization in 2017.  She presents with complaints of bloody stools over the last 3 days.  Patient has had 2 or 3 bloody bowel movements with clots present.  The granddaughter notes that she is on a 81 mg aspirin daily, but is not on any other blood thinners.  She has been taking MiraLAX for issues with constipation.  Associated symptoms include significant weakness and chills.  Patient denies any fever, chest pain, shortness of breath, abdominal pain, nausea, vomiting, or diarrhea symptoms.  Patient previously had been in ICU back in 2017 with a GI bleed requiring mesentery angiogram with embolization of the ileocecal artery by IR   ED Course: Upon admission into the emergency department patient was noted to be stable.  Fecal occult blood samples were noted to be positive, but had initially been put and as a point-of-care positive COVID-19.  Influenza and COVID-19 screening was noted to be negative.  Labs significant for hemoglobin initially 9.8->10.1, creatinine 1.36, and BUN 24.  Gastroenterology was formally consulted  Assessment & Plan:   Principal Problem:   GI bleed Active Problems:   Essential hypertension   COPD with emphysema Gold Stage A   Acute blood loss anemia   Acute kidney injury superimposed on chronic kidney disease (HCC)   Chronic diastolic CHF (congestive heart failure) (HCC)   Do not resuscitate  Acute blood loss anemia secondary to lower GI bleed:  -FOBT positive in ER and Hg drop from baseline -No bleeding thus far in hospital -Threshold for transfusion Hg <8, 8.2 this morning -GI consulted and they recommended tagged RBC scan if further bleeding episodes -Likely no procedure due to age  unless emergently indicated  -Hold aspirin -Soft diet for now -Continue to monitor H&H daily   Acute kidney injury superimposed on chronic kidney disease stage III:  -Baseline 1, today 1.13 which is improved from admission -continue hydration until eating well -monitor volume status closely given prior diastolic dysfunction -Recheck kidney function in a.m.  Diastolic diastolic congestive heart failure: Patient appears to be euvolemic at this time.  Last EF noted to be 60- 65% by echocardiogram in 01/2019. -Monitor intake and output -Daily weights  Essential hypertension: Blood pressure currently stable. -Continue amlodipine   COPD, without acute exacerbation: Patient was noted not to be positive for COVID-19. -Continue Spiriva  DVT prophylaxis: SCDs Code Status: DNR Family Communication: patient only, tried to contact granddaughter without answer twice, left her a voicemail updating her on progress Disposition Plan: pending clinical progress 1-2 days  Consultants:   GI  Procedures:   None  Antimicrobials:   None   Subjective: Overall feeling okay today. No more bleeding since ER. Denies lightheadedness or dizziness. Denies chest pains. Denies abdominal pain or nausea or vomiting. Not eating breakfast and states she did not want it and does not know who ordered it.   Objective: Vitals:   01/09/20 1541 01/09/20 2006 01/10/20 0431 01/10/20 0954  BP: (!) 114/51 (!) 108/52 (!) 106/42 (!) 106/42  Pulse: 84 86 79   Resp: 15 16 20    Temp: 97.6 F (36.4 C) (!) 97.5 F (36.4 C) 98.2 F (36.8 C)   TempSrc: Oral Oral Oral   SpO2: 99% 100% 100%  Intake/Output Summary (Last 24 hours) at 01/10/2020 1032 Last data filed at 01/10/2020 1010 Gross per 24 hour  Intake 0 ml  Output --  Net 0 ml   There were no vitals filed for this visit.  Examination:  General exam: Appears calm and comfortable, hard of hearing Respiratory system: Clear to auscultation. Respiratory  effort normal. Cardiovascular system: S1 & S2 heard, RRR. No JVD, murmurs, rubs, gallops or clicks. No pedal edema. Gastrointestinal system: Abdomen is nondistended, soft and nontender. No organomegaly or masses felt. Normal bowel sounds heard. Central nervous system: Alert and oriented to person and place not day. No focal neurological deficits. Extremities: Symmetric 5 x 5 power. Skin: No rashes, lesions or ulcers Psychiatry: Judgement and insight appear normal. Mood & affect appropriate.   Data Reviewed: I have personally reviewed following labs and imaging studies  CBC: Recent Labs  Lab 01/08/20 2014 01/09/20 0807 01/09/20 1441 01/10/20 0231  WBC 4.8 6.0  --  4.8  NEUTROABS 2.4  --   --   --   HGB 9.8* 10.1* 9.2* 8.2*  HCT 30.9* 32.5* 28.7* 25.5*  MCV 105.1* 104.8*  --  102.0*  PLT 251 255  --  937   Basic Metabolic Panel: Recent Labs  Lab 01/08/20 2014 01/10/20 0231  NA 136 144  K 4.4 4.8  CL 106 114*  CO2 20* 22  GLUCOSE 166* 92  BUN 24* 27*  CREATININE 1.36* 1.13*  CALCIUM 8.8* 9.0   GFR: CrCl cannot be calculated (Unknown ideal weight.). Liver Function Tests: Recent Labs  Lab 01/08/20 2014  AST 21  ALT 9  ALKPHOS 75  BILITOT 0.6  PROT 6.1*  ALBUMIN 3.1*   No results for input(s): LIPASE, AMYLASE in the last 168 hours. No results for input(s): AMMONIA in the last 168 hours. Coagulation Profile: Recent Labs  Lab 01/08/20 2014  INR 1.1   Cardiac Enzymes: No results for input(s): CKTOTAL, CKMB, CKMBINDEX, TROPONINI in the last 168 hours. BNP (last 3 results) No results for input(s): PROBNP in the last 8760 hours. HbA1C: No results for input(s): HGBA1C in the last 72 hours. CBG: No results for input(s): GLUCAP in the last 168 hours. Lipid Profile: No results for input(s): CHOL, HDL, LDLCALC, TRIG, CHOLHDL, LDLDIRECT in the last 72 hours. Thyroid Function Tests: No results for input(s): TSH, T4TOTAL, FREET4, T3FREE, THYROIDAB in the last 72  hours. Anemia Panel: No results for input(s): VITAMINB12, FOLATE, FERRITIN, TIBC, IRON, RETICCTPCT in the last 72 hours. Sepsis Labs: No results for input(s): PROCALCITON, LATICACIDVEN in the last 168 hours.  Recent Results (from the past 240 hour(s))  Respiratory Panel by RT PCR (Flu A&B, Covid) - Nasopharyngeal Swab     Status: None   Collection Time: 01/09/20  8:07 AM   Specimen: Nasopharyngeal Swab  Result Value Ref Range Status   SARS Coronavirus 2 by RT PCR NEGATIVE NEGATIVE Final    Comment: (NOTE) SARS-CoV-2 target nucleic acids are NOT DETECTED. The SARS-CoV-2 RNA is generally detectable in upper respiratoy specimens during the acute phase of infection. The lowest concentration of SARS-CoV-2 viral copies this assay can detect is 131 copies/mL. A negative result does not preclude SARS-Cov-2 infection and should not be used as the sole basis for treatment or other patient management decisions. A negative result may occur with  improper specimen collection/handling, submission of specimen other than nasopharyngeal swab, presence of viral mutation(s) within the areas targeted by this assay, and inadequate number of viral copies (<131 copies/mL). A  negative result must be combined with clinical observations, patient history, and epidemiological information. The expected result is Negative. Fact Sheet for Patients:  https://www.moore.com/ Fact Sheet for Healthcare Providers:  https://www.young.biz/ This test is not yet ap proved or cleared by the [place] FDA and  has been authorized for detection and/or diagnosis of SARS-CoV-2 by FDA under an Emergency Use Authorization (EUA). This EUA will remain  in effect (meaning this test can be used) for the duration of the COVID-19 declaration under Section 564(b)(1) of the Act, 21 U.S.C. section 360bbb-3(b)(1), unless the authorization is terminated or revoked sooner.    Influenza A by PCR  NEGATIVE NEGATIVE Final   Influenza B by PCR NEGATIVE NEGATIVE Final    Comment: (NOTE) The Xpert Xpress SARS-CoV-2/FLU/RSV assay is intended as an aid in  the diagnosis of influenza from Nasopharyngeal swab specimens and  should not be used as a sole basis for treatment. Nasal washings and  aspirates are unacceptable for Xpert Xpress SARS-CoV-2/FLU/RSV  testing. Fact Sheet for Patients: https://www.moore.com/ Fact Sheet for Healthcare Providers: https://www.young.biz/ This test is not yet approved or cleared by the [place] FDA and  has been authorized for detection and/or diagnosis of SARS-CoV-2 by  FDA under an Emergency Use Authorization (EUA). This EUA will remain  in effect (meaning this test can be used) for the duration of the  Covid-19 declaration under Section 564(b)(1) of the Act, 21  U.S.C. section 360bbb-3(b)(1), unless the authorization is  terminated or revoked. Performed at [facility], [address] [place], [place] [address]      Radiology Studies: No results found.  Scheduled Meds: . amLODipine  2.5 mg Oral Daily  . sodium chloride flush  3 mL Intravenous Q12H  . umeclidinium bromide  1 puff Inhalation Daily   Continuous Infusions: . sodium chloride 50 mL/hr at 01/09/20 1636     LOS: 1 day   Time spent: 25  Myrlene Broker, MD [facility] Pager [phone]  If 7PM-7AM, please contact night-coverage [email or web address] Password [facility] 01/10/2020, 10:32 AM

## 2020-01-10 NOTE — Progress Notes (Addendum)
          Daily Rounding Note  01/10/2020, 9:29 AM  LOS: 1 day   SUBJECTIVE:   Chief complaint: Painless hematochezia.  RN entered note stating "patient's granddaughter expressed concerns that she is not comfortable with patient coming home until the source of her bleeding is found".     No further bloody or otherwise colored BMs.  Blood pressure consistently soft 106-122/42-63.  Denies abdominal pain, nausea.  She just feels cold.  Also feels a little bit disoriented as she does not know what time it is.  OBJECTIVE:         Vital signs in last 24 hours:    Temp:  [97.5 F (36.4 C)-98.2 F (36.8 C)] 98.2 F (36.8 C) (01/14 0431) Pulse Rate:  [79-90] 79 (01/14 0431) Resp:  [15-20] 20 (01/14 0431) BP: (106-125)/(42-63) 106/42 (01/14 0431) SpO2:  [98 %-100 %] 100 % (01/14 0431) Last BM Date: 01/09/20(per pt) There were no vitals filed for this visit. General: Aged, frail, alert, comfortable AAF. Heart: RRR. Chest: Clear bilaterally.  No labored breathing. Abdomen: Nontender, nondistended.  Active bowel sounds. Extremities: No CCE. Neuro/Psych: Appropriate, fully alert.  Oriented to [facility] birthdate, the [facility]".   Had to send card to tell me her date of birth.  Unable to name the year.  Intake/Output from previous day: No intake/output data recorded.  Intake/Output this shift: No intake/output data recorded.  Lab Results: Recent Labs    01/08/20 2014 01/08/20 2014 01/09/20 0807 01/09/20 1441 01/10/20 0231  WBC 4.8  --  6.0  --  4.8  HGB 9.8*   < > 10.1* 9.2* 8.2*  HCT 30.9*   < > 32.5* 28.7* 25.5*  PLT 251  --  255  --  230   < > = values in this interval not displayed.   BMET Recent Labs    01/08/20 2014 01/10/20 0231  NA 136 144  K 4.4 4.8  CL 106 114*  CO2 20* 22  GLUCOSE 166* 92  BUN 24* 27*  CREATININE 1.36* 1.13*  CALCIUM 8.8* 9.0   LFT Recent Labs    01/08/20 2014  PROT 6.1*  ALBUMIN 3.1*    AST 21  ALT 9  ALKPHOS 75  BILITOT 0.6   PT/INR Recent Labs    01/08/20 2014  LABPROT 13.9  INR 1.1   Hepatitis Panel No results for input(s): HEPBSAG, HCVAB, HEPAIGM, HEPBIGM in the last 72 hours.  Studies/Results: No results found.  ASSESMENT:   *    Painless hematochezia. Presumed diverticular bleed.  Latest colonoscopy 2006 revealed only diverticulosis. Painless hematochezia 08/2016 confirmed tiny active bleed at cecum/ascending colon. Underwent coil embolization of unnamed a sending colonic branch artery.  *     Blood loss anemia.  Macrocytic. Hb 9.8 >> 10.1 >> 8.2.  Hgb 12.4 on 12/17/2019 No PRBCs to date.  *    AKI.  *   Aortic valve stenosis.   PLAN   *   Given no ongoing bleeding, no role for nuclear medicine bleeding scan and prefer to avoid colonoscopy in a frail, aged pt.    *   CBC in AM.  Continue soft diet.    Jennye Moccasin  01/10/2020, 9:29 AM Phone [phone]

## 2020-01-11 DIAGNOSIS — E43 Unspecified severe protein-calorie malnutrition: Secondary | ICD-10-CM | POA: Insufficient documentation

## 2020-01-11 LAB — HEMOGLOBIN AND HEMATOCRIT, BLOOD
HCT: 23.9 % — ABNORMAL LOW (ref 36.0–46.0)
HCT: 23.9 % — ABNORMAL LOW (ref 36.0–46.0)
Hemoglobin: 7.9 g/dL — ABNORMAL LOW (ref 12.0–15.0)
Hemoglobin: 7.7 g/dL — ABNORMAL LOW (ref 12.0–15.0)

## 2020-01-11 LAB — CBC
HCT: 22.8 % — ABNORMAL LOW (ref 36.0–46.0)
Hemoglobin: 7.4 g/dL — ABNORMAL LOW (ref 12.0–15.0)
MCH: 33 pg (ref 26.0–34.0)
MCHC: 32.5 g/dL (ref 30.0–36.0)
MCV: 101.8 fL — ABNORMAL HIGH (ref 80.0–100.0)
Platelets: 204 10*3/uL (ref 150–400)
RBC: 2.24 MIL/uL — ABNORMAL LOW (ref 3.87–5.11)
RDW: 13.7 % (ref 11.5–15.5)
WBC: 4.2 10*3/uL (ref 4.0–10.5)
nRBC: 0 % (ref 0.0–0.2)

## 2020-01-11 LAB — BASIC METABOLIC PANEL
Anion gap: 9 (ref 5–15)
BUN: 23 mg/dL (ref 8–23)
CO2: 20 mmol/L — ABNORMAL LOW (ref 22–32)
Calcium: 9 mg/dL (ref 8.9–10.3)
Chloride: 113 mmol/L — ABNORMAL HIGH (ref 98–111)
Creatinine, Ser: 1.18 mg/dL — ABNORMAL HIGH (ref 0.44–1.00)
GFR calc Af Amer: 45 mL/min — ABNORMAL LOW (ref >60)
GFR calc non Af Amer: 39 mL/min — ABNORMAL LOW (ref >60)
Glucose, Bld: 87 mg/dL (ref 70–99)
Potassium: 4.1 mmol/L (ref 3.5–5.1)
Sodium: 142 mmol/L (ref 135–145)

## 2020-01-11 MED ORDER — ADULT MULTIVITAMIN W/MINERALS CH
1.0000 | ORAL_TABLET | Freq: Every day | ORAL | Status: DC
  Administered 2020-01-11 – 2020-01-12 (×2): 1 via ORAL
  Filled 2020-01-11 (×2): qty 1

## 2020-01-11 MED ORDER — ENSURE ENLIVE PO LIQD
237.0000 mL | Freq: Two times a day (BID) | ORAL | Status: DC
  Administered 2020-01-11 – 2020-01-12 (×3): 237 mL via ORAL

## 2020-01-11 NOTE — Progress Notes (Signed)
          Daily Rounding Note  01/11/2020, 11:31 AM  LOS: 2 days   SUBJECTIVE:   Chief complaint:  Hematochezia, diverticular bleed     No BM's yesterday or overnight. Tolerating soft diet.  No complaints.  OBJECTIVE:         Vital signs in last 24 hours:    Temp:  [97.7 F (36.5 C)-98.6 F (37 C)] 98.2 F (36.8 C) (01/15 0332) Pulse Rate:  [81-86] 86 (01/15 0332) Resp:  [14-16] 16 (01/15 0332) BP: (111-117)/(39-63) 111/63 (01/15 0332) SpO2:  [100 %] 100 % (01/15 0332) Weight:  [41.2 kg] 41.2 kg (01/14 1620) Last BM Date: 01/10/20 Filed Weights   01/10/20 1620  Weight: 41.2 kg   General: Frail, aged.  Alert, comfortable. Heart: RRR. Chest: Clear bilaterally.  No labored breathing or cough Abdomen: Nontender, nondistended.  Active bowel sounds. Extremities: Thin, no edema. Neuro/Psych: Calm, pleasant, cooperative.  No gross deficits, speech slow but precise  Intake/Output from previous day: 01/14 0701 - 01/15 0700 In: 1447 [P.O.:520; I.V.:927] Out: -   Intake/Output this shift: No intake/output data recorded.  Lab Results: Recent Labs    01/10/20 0231 01/10/20 1106 01/11/20 0409  WBC 4.8 4.8 4.2  HGB 8.2* 9.1* 7.4*  HCT 25.5* 28.6* 22.8*  PLT 230 261 204   BMET Recent Labs    01/08/20 2014 01/10/20 0231 01/11/20 0409  NA 136 144 142  K 4.4 4.8 4.1  CL 106 114* 113*  CO2 20* 22 20*  GLUCOSE 166* 92 87  BUN 24* 27* 23  CREATININE 1.36* 1.13* 1.18*  CALCIUM 8.8* 9.0 9.0   LFT Recent Labs    01/08/20 2014  PROT 6.1*  ALBUMIN 3.1*  AST 21  ALT 9  ALKPHOS 75  BILITOT 0.6   PT/INR Recent Labs    01/08/20 2014  LABPROT 13.9  INR 1.1   Hepatitis Panel No results for input(s): HEPBSAG, HCVAB, HEPAIGM, HEPBIGM in the last 72 hours.  Studies/Results: No results found.  ASSESMENT:   *    Painless hematochezia. Presumed diverticular bleed.  Latest colonoscopy 2006 revealed only  diverticulosis. Painless hematochezia 08/2016 confirmed tiny active bleed at cecum/ascending colon. Underwent coil embolization of unnamed a sending colonic branch artery.  *     Blood loss anemia.  Macrocytic. Hb 9.8 >> 10.1 >> 8.2 >> 7.4.  Hgb 12.4 on 12/17/2019 No PRBCs to date.  *    AKI.  *   Aortic valve stenosis.   *   Severe malnutrition per RD assessment.  Ensure, Magic cup, MVI supplements added.   PLAN   *    With drop in Hgb would delay discharge.   H&H this afternoon.     Jennye Moccasin  01/11/2020, 11:31 AM Phone [phone]

## 2020-01-11 NOTE — Progress Notes (Signed)
PROGRESS NOTE    Desiree Hutchinson  PNT:[id number] DOB: [birth year] DOA: 01/08/2020 PCP: Girtha Rm, NP-C   Brief Narrative:  Desiree Hutchinson a 86 y.o.femalewith medical history significant ofhypertension, chronic diastolic CHF, anemia, COPD, SBO, GI bleed requiring embolization in 2017.She presents with complaints of bloody stools over the last 3 days. Patient has had 2 or 3 bloody bowel movements with clots present. The granddaughter notes that she is on a 81 mg aspirin daily, but is not on any other blood thinners. She has been taking MiraLAX for issues with constipation. Associated symptoms include significant weakness and chills. Patient denies any fever, chest pain, shortness of breath, abdominal pain, nausea, vomiting, or diarrhea symptoms. Patient previously had been in ICU back in 2017 with a GI bleed requiring mesentery angiogram with embolization of the ileocecal artery by IR  Interim history: No further episode of bleeding since admission but Hg is steadily decreasing. Overall stable.   Assessment & Plan:   Principal Problem:   GI bleed Active Problems:   Essential hypertension   COPD with emphysema Gold Stage A   Acute blood loss anemia   Acute kidney injury superimposed on chronic kidney disease (HCC)   Chronic diastolic CHF (congestive heart failure) (HCC)   Do not resuscitate   Protein-calorie malnutrition, severe  Acute blood loss anemia due to lower GI bleed: -FOBT positive in ER and Hg drop from baseline -No bleeding thus far in hospital -Threshold for transfusion Hg <8, 7.4 this morning, rechecked by GI and stable at 7.9 -since no bleeding will hold on prbc, if any further drop needs PRBC -BUN is dropping since admission -GI consulted and they recommended tagged RBC scan if further bleeding episodes -Likely no procedure due to age unless emergently indicated -Hold aspirin -Soft diet for now -Continue to monitor H&H, GI ordered for this  afternoon -check CBC in AM also  Acute kidney injury superimposed on chronic kidney disease stage III:  -Baseline 1, today 1.13 which is improved from admission -continue hydration until eating well -monitor volume status closely given prior diastolic dysfunction -Recheck kidney function in a.m.  Diastolicdiastolic congestive heart failure: Patient appears to be euvolemic at this time. Last EF noted to be 60-65% by echocardiogram in 01/2019. -Monitor intake and output -Daily weights  Essential hypertension: Blood pressure currently stable. -will hold amlodipine today for low BP   COPD, without acute exacerbation: Patient was noted negative for COVID-19. -Continue Spiriva   DVT prophylaxis: scds Code Status: dnr Family Communication: patient only Disposition Plan: likely 01/12/20 if Hg stable   Consultants:  GI  Procedures:   none  Antimicrobials:   none   Subjective: Overall feeling okay, no more bleeding thus far. Does have chronic arthritis pain which is stable. Denies chest pains or SOB. Denies abdominal pain or diarrhea. She is not sure if she had BM yesterday. Passing gas okay.   Objective: Vitals:   01/10/20 1619 01/10/20 1620 01/10/20 2206 01/11/20 0332  BP: (!) 116/39  (!) 117/52 111/63  Pulse: 81  84 86  Resp: 16  14 16   Temp: 97.7 F (36.5 C)  98.6 F (37 C) 98.2 F (36.8 C)  TempSrc: Oral  Oral Oral  SpO2:   100% 100%  Weight:  41.2 kg    Height:  5' (1.524 m)      Intake/Output Summary (Last 24 hours) at 01/11/2020 1142 Last data filed at 01/11/2020 0337 Gross per 24 hour  Intake 1446.97 ml  Output --  Net 1446.97 ml   Filed Weights   01/10/20 1620  Weight: 41.2 kg    Examination:  General exam: Appears calm and comfortable, hard of hearing Respiratory system: Clear to auscultation. Respiratory effort normal. Cardiovascular system: S1 & S2 heard, RRR. No JVD, murmurs, rubs, gallops or clicks. No pedal edema. Gastrointestinal  system: Abdomen is nondistended, soft and nontender. No organomegaly or masses felt. Normal bowel sounds heard. Central nervous system: Alert and oriented. No focal neurological deficits. Extremities: Symmetric 5 x 5 power. Skin: No rashes, lesions or ulcers Psychiatry: Judgement and insight appear normal. Mood & affect appropriate.   Data Reviewed: I have personally reviewed following labs and imaging studies  CBC: Recent Labs  Lab 01/08/20 2014 01/08/20 2014 01/09/20 0807 01/09/20 0807 01/09/20 1441 01/10/20 0231 01/10/20 1106 01/11/20 0409 01/11/20 1106  WBC 4.8  --  6.0  --   --  4.8 4.8 4.2  --   NEUTROABS 2.4  --   --   --   --   --   --   --   --   HGB 9.8*   < > 10.1*   < > 9.2* 8.2* 9.1* 7.4* 7.9*  HCT 30.9*   < > 32.5*   < > 28.7* 25.5* 28.6* 22.8* 23.9*  MCV 105.1*  --  104.8*  --   --  102.0* 102.9* 101.8*  --   PLT 251  --  255  --   --  230 261 204  --    < > = values in this interval not displayed.   Basic Metabolic Panel: Recent Labs  Lab 01/08/20 2014 01/10/20 0231 01/11/20 0409  NA 136 144 142  K 4.4 4.8 4.1  CL 106 114* 113*  CO2 20* 22 20*  GLUCOSE 166* 92 87  BUN 24* 27* 23  CREATININE 1.36* 1.13* 1.18*  CALCIUM 8.8* 9.0 9.0   GFR: Estimated Creatinine Clearance: 17.7 mL/min (A) (by C-G formula based on SCr of 1.18 mg/dL (H)). Liver Function Tests: Recent Labs  Lab 01/08/20 2014  AST 21  ALT 9  ALKPHOS 75  BILITOT 0.6  PROT 6.1*  ALBUMIN 3.1*   No results for input(s): LIPASE, AMYLASE in the last 168 hours. No results for input(s): AMMONIA in the last 168 hours. Coagulation Profile: Recent Labs  Lab 01/08/20 2014  INR 1.1   Cardiac Enzymes: No results for input(s): CKTOTAL, CKMB, CKMBINDEX, TROPONINI in the last 168 hours. BNP (last 3 results) No results for input(s): PROBNP in the last 8760 hours. HbA1C: No results for input(s): HGBA1C in the last 72 hours. CBG: No results for input(s): GLUCAP in the last 168 hours. Lipid  Profile: No results for input(s): CHOL, HDL, LDLCALC, TRIG, CHOLHDL, LDLDIRECT in the last 72 hours. Thyroid Function Tests: No results for input(s): TSH, T4TOTAL, FREET4, T3FREE, THYROIDAB in the last 72 hours. Anemia Panel: No results for input(s): VITAMINB12, FOLATE, FERRITIN, TIBC, IRON, RETICCTPCT in the last 72 hours. Sepsis Labs: No results for input(s): PROCALCITON, LATICACIDVEN in the last 168 hours.  Recent Results (from the past 240 hour(s))  Respiratory Panel by RT PCR (Flu A&B, Covid) - Nasopharyngeal Swab     Status: None   Collection Time: 01/09/20  8:07 AM   Specimen: Nasopharyngeal Swab  Result Value Ref Range Status   SARS Coronavirus 2 by RT PCR NEGATIVE NEGATIVE Final    Comment: (NOTE) SARS-CoV-2 target nucleic acids are NOT DETECTED. The SARS-CoV-2 RNA is generally detectable in upper respiratoy  specimens during the acute phase of infection. The lowest concentration of SARS-CoV-2 viral copies this assay can detect is 131 copies/mL. A negative result does not preclude SARS-Cov-2 infection and should not be used as the sole basis for treatment or other patient management decisions. A negative result may occur with  improper specimen collection/handling, submission of specimen other than nasopharyngeal swab, presence of viral mutation(s) within the areas targeted by this assay, and inadequate number of viral copies (<131 copies/mL). A negative result must be combined with clinical observations, patient history, and epidemiological information. The expected result is Negative. Fact Sheet for Patients:  https://www.moore.com/ Fact Sheet for Healthcare Providers:  https://www.young.biz/ This test is not yet ap proved or cleared by the [place] FDA and  has been authorized for detection and/or diagnosis of SARS-CoV-2 by FDA under an Emergency Use Authorization (EUA). This EUA will remain  in effect (meaning this test can be  used) for the duration of the COVID-19 declaration under Section 564(b)(1) of the Act, 21 U.S.C. section 360bbb-3(b)(1), unless the authorization is terminated or revoked sooner.    Influenza A by PCR NEGATIVE NEGATIVE Final   Influenza B by PCR NEGATIVE NEGATIVE Final    Comment: (NOTE) The Xpert Xpress SARS-CoV-2/FLU/RSV assay is intended as an aid in  the diagnosis of influenza from Nasopharyngeal swab specimens and  should not be used as a sole basis for treatment. Nasal washings and  aspirates are unacceptable for Xpert Xpress SARS-CoV-2/FLU/RSV  testing. Fact Sheet for Patients: https://www.moore.com/ Fact Sheet for Healthcare Providers: https://www.young.biz/ This test is not yet approved or cleared by the [place] FDA and  has been authorized for detection and/or diagnosis of SARS-CoV-2 by  FDA under an Emergency Use Authorization (EUA). This EUA will remain  in effect (meaning this test can be used) for the duration of the  Covid-19 declaration under Section 564(b)(1) of the Act, 21  U.S.C. section 360bbb-3(b)(1), unless the authorization is  terminated or revoked. Performed at [facility], [address] [place], [place] [address]      Radiology Studies: No results found.   Scheduled Meds: . amLODipine  2.5 mg Oral Daily  . feeding supplement (ENSURE ENLIVE)  237 mL Oral BID BM  . multivitamin with minerals  1 tablet Oral Daily  . sodium chloride flush  3 mL Intravenous Q12H  . umeclidinium bromide  1 puff Inhalation Daily   Continuous Infusions: . sodium chloride 50 mL/hr at 01/10/20 1444     LOS: 2 days   Time spent: 25  Myrlene Broker, MD [facility] Pager [phone]  If 7PM-7AM, please contact night-coverage [email or web address] Password [facility] 01/11/2020, 11:42 AM

## 2020-01-11 NOTE — Progress Notes (Signed)
Initial Nutrition Assessment  DOCUMENTATION CODES:   Underweight, Severe malnutrition in context of chronic illness  INTERVENTION:   -Ensure Enlive po BID, each supplement provides 350 kcal and 20 grams of protein -MVI with minerals daily -Magic cup TID with meals, each supplement provides 290 kcal and 9 grams of protein  NUTRITION DIAGNOSIS:   Severe Malnutrition related to chronic illness(COPD) as evidenced by severe fat depletion, severe muscle depletion.  GOAL:   Patient will meet greater than or equal to 90% of their needs  MONITOR:   PO intake, Supplement acceptance, Weight trends, Skin, I & O's  REASON FOR ASSESSMENT:   Other (Comment)    ASSESSMENT:   Desiree Hutchinson is a 86 y.o. female with medical history significant of hypertension, chronic diastolic CHF, anemia, COPD, SBO, GI bleed requiring embolization in 2017.  She presents with complaints of bloody stools over the last 3 days.  Patient has had 2 or 3 bloody bowel movements with clots present.  The granddaughter notes that she is on a 81 mg aspirin daily, but is not on any other blood thinners.  She has been taking MiraLAX for issues with constipation.  Associated symptoms include significant weakness and chills.  Patient denies any fever, chest pain, shortness of breath, abdominal pain, nausea, vomiting, or diarrhea symptoms.  Patient previously had been in ICU back in 2017 with a GI bleed requiring mesentery angiogram with embolization of the ileocecal artery by IR  Pt admitted with symptomatic anemia secondary to GIB.   Reviewed I/O's: +1.4 L x 24 hours  Spoke with pt at bedside, who was pleasant but tangential at time of visit. She reports poor appetite during hospitalization, but consumed some grits and peaches this AM (documented meal completion 0-100%). Pt reports that her appetite was great PTA and usually consumed 3 meals per day (Breakfast: sausage; Lunch: breakfast foods, Dinner: vegetables, such as  collard greens). Pt reports that her appetite is always good at dinner, but often consumes smaller meals at breakfast and lunch as she is not hungry during those times. Her grandchildren live with her and help her.   Pt reports that she has always been petite and her baseline weight was 120# as an adult. Noted weight has been stable over the past year.   Discussed with pt importance of good meal and supplement intake to promote healing. Pt pleasant, but distracted during visit, often changing the subject to her IV pole or her grandchildren's accomplishments.   Labs reviewed.   NUTRITION - FOCUSED PHYSICAL EXAM:    Most Recent Value  Orbital Region  Severe depletion  Upper Arm Region  Severe depletion  Thoracic and Lumbar Region  Moderate depletion  Buccal Region  Severe depletion  Temple Region  Severe depletion  Clavicle Bone Region  Severe depletion  Clavicle and Acromion Bone Region  Severe depletion  Scapular Bone Region  Severe depletion  Dorsal Hand  Severe depletion  Patellar Region  Moderate depletion  Anterior Thigh Region  Moderate depletion  Posterior Calf Region  Moderate depletion  Edema (RD Assessment)  None  Hair  Reviewed  Eyes  Reviewed  Mouth  Reviewed  Skin  Reviewed  Nails  Reviewed       Diet Order:   Diet Order            DIET SOFT Room service appropriate? Yes; Fluid consistency: Thin  Diet effective now              EDUCATION NEEDS:  Education needs have been addressed  Skin:  Skin Assessment: Reviewed RN Assessment  Last BM:  01/10/20  Height:   Ht Readings from Last 1 Encounters:  01/10/20 5' (1.524 m)    Weight:   Wt Readings from Last 1 Encounters:  01/10/20 41.2 kg    Ideal Body Weight:  45.4 kg  BMI:  Body mass index is 17.74 kg/m.  Estimated Nutritional Needs:   Kcal:  1250-1450  Protein:  50-65 grams  Fluid:  > 1.2 L    Tara Rud A. Jimmye Norman, RD, LDN, [place] Registered Dietitian II Certified Diabetes Care and  Education Specialist Pager: [phone] After hours Pager: [phone]

## 2020-01-12 DIAGNOSIS — E43 Unspecified severe protein-calorie malnutrition: Secondary | ICD-10-CM

## 2020-01-12 LAB — CBC
HCT: 23.2 % — ABNORMAL LOW (ref 36.0–46.0)
Hemoglobin: 7.7 g/dL — ABNORMAL LOW (ref 12.0–15.0)
MCH: 33.6 pg (ref 26.0–34.0)
MCHC: 33.2 g/dL (ref 30.0–36.0)
MCV: 101.3 fL — ABNORMAL HIGH (ref 80.0–100.0)
Platelets: 213 10*3/uL (ref 150–400)
RBC: 2.29 MIL/uL — ABNORMAL LOW (ref 3.87–5.11)
RDW: 13.6 % (ref 11.5–15.5)
WBC: 4.5 10*3/uL (ref 4.0–10.5)
nRBC: 0 % (ref 0.0–0.2)

## 2020-01-12 LAB — FERRITIN: Ferritin: 132 ng/mL (ref 11–307)

## 2020-01-12 MED ORDER — ADULT MULTIVITAMIN W/MINERALS CH: 1.0000 | ORAL_TABLET | Freq: Every day | ORAL | 0 refills | Status: DC

## 2020-01-12 MED ORDER — POLYETHYLENE GLYCOL 3350 17 GM/SCOOP PO POWD: 17.0000 g | Freq: Every day | ORAL | 2 refills | Status: AC

## 2020-01-12 NOTE — Progress Notes (Signed)
Patient ID: Desiree Hutchinson, female   DOB: 07-20-1920, 86 y.o.   MRN: [id number]    Progress Note   Subjective  Day # 3 CC; diverticular bleed  Hemoglobin 7.7-stable Ferritin 132  No further bleeding, last 2 bowel movements brown Per patient's family member they are being discharged home today   Objective   Vital signs in last 24 hours: Temp:  [97.6 F (36.4 C)-98.2 F (36.8 C)] 97.6 F (36.4 C) (01/16 1335) Pulse Rate:  [76-82] 76 (01/16 1335) Resp:  [14-16] 16 (01/16 1335) BP: (100-134)/(50-66) 100/55 (01/16 1335) SpO2:  [99 %-100 %] 99 % (01/16 1335) Last BM Date: 01/10/20 General:    Very elderly African-American female in NAD, pleasant Heart:  Regular rate and rhythm; no murmurs Lungs: Respirations even and unlabored, lungs CTA bilaterally Abdomen:  Soft, nontender and nondistended. Normal bowel sounds. Extremities:  Without edema. Neurologic:  Alert and oriented,  grossly normal neurologically. Psych:  Cooperative. Normal mood and affect.  Intake/Output from previous day: 01/15 0701 - 01/16 0700 In: 925.8 [P.O.:120; I.V.:805.8] Out: -  Intake/Output this shift: Total I/O In: -  Out: 200 [Urine:200]  Lab Results: Recent Labs    01/10/20 1106 01/10/20 1106 01/11/20 0409 01/11/20 0409 01/11/20 1106 01/11/20 1855 01/12/20 0432  WBC 4.8  --  4.2  --   --   --  4.5  HGB 9.1*   < > 7.4*   < > 7.9* 7.7* 7.7*  HCT 28.6*   < > 22.8*   < > 23.9* 23.9* 23.2*  PLT 261  --  204  --   --   --  213   < > = values in this interval not displayed.   BMET Recent Labs    01/10/20 0231 01/11/20 0409  NA 144 142  K 4.8 4.1  CL 114* 113*  CO2 22 20*  GLUCOSE 92 87  BUN 27* 23  CREATININE 1.13* 1.18*  CALCIUM 9.0 9.0   LFT No results for input(s): PROT, ALBUMIN, AST, ALT, ALKPHOS, BILITOT, BILIDIR, IBILI in the last 72 hours. PT/INR No results for input(s): LABPROT, INR in the last 72 hours.      Assessment / Plan:    #64 86 year old African-American  female admitted with acute GI bleed, felt diverticular in etiology. Bleeding has resolved and hemoglobin has been stable over the past 2 days.  #2 anemia secondary to acute blood loss  Plan; patient is stable for discharge today We will have her come to our office next Wednesday for follow-up hemoglobin.    Principal Problem:   GI bleed Active Problems:   Essential hypertension   COPD with emphysema Gold Stage A   Acute blood loss anemia   Acute kidney injury superimposed on chronic kidney disease (HCC)   Chronic diastolic CHF (congestive heart failure) (HCC)   Do not resuscitate   Protein-calorie malnutrition, severe     LOS: 3 days   Amy Esterwood PA-C 01/12/2020, 3:22 PM

## 2020-01-12 NOTE — Progress Notes (Signed)
Discharged home today,patient's daughter driving her home. Discharged instructions,personal belongings given.

## 2020-01-12 NOTE — Discharge Summary (Signed)
Physician Discharge Summary  DAVIANNA Hutchinson GGE:[id number] DOB: Oct 25, 1920 DOA: 01/08/2020  PCP: Girtha Rm, NP-C  Admit date: 01/08/2020 Discharge date: 01/12/2020  Admitted From: home (lives with granddaughter) Disposition:  Home with family   Recommendations for Outpatient Follow-up:  Follow up with Gastroenterology for CBC on Wednesday   Home Health:None  Equipment/Devices:None  Discharge Condition:Improved CODE STATUS:DNR  Diet recommendation: Soft   Brief/Interim Summary: Desiree Hutchinson is a 86 y.o. female with medical history significant of hypertension, chronic diastolic CHF, anemia, COPD, SBO, GI bleed requiring embolization in 2017 presented with likely diverticular bleed.  She had grossly bloody bowel movements at home.  Gastroenterology was consulted they recommended close monitoring with consideration of further evaluation of bleeding persisted.  She had several normal bowel movements of her hospital stay.  Her hemoglobin did indeed turn down but not required transfusion.  On day of discharge she was seen by gastroenterology who agreed she was stable for discharge.  She is acting as her usual self per her family.  We will plan to discharge home with the care of her granddaughter.  The patient takes aspirin for what appears to be primary prevention.  Recommend discussion with primary care physician at follow-up as to whether or not she should continue this medication given her recurrent gastrointestinal bleeding.  Discharge Diagnoses:  Principal Problem:   GI bleed Active Problems:   Essential hypertension   COPD with emphysema Gold Stage A   Acute blood loss anemia   Acute kidney injury superimposed on chronic kidney disease (HCC)   Chronic diastolic CHF (congestive heart failure) (HCC)   Do not resuscitate   Protein-calorie malnutrition, severe    Discharge Instructions  Discharge Instructions    Call MD for:  difficulty breathing, headache or visual  disturbances   Complete by: As directed    Call MD for:  extreme fatigue   Complete by: As directed    Call MD for:  hives   Complete by: As directed    Call MD for:  persistant dizziness or light-headedness   Complete by: As directed    Call MD for:  persistant nausea and vomiting   Complete by: As directed    Call MD for:  redness, tenderness, or signs of infection (pain, swelling, redness, odor or green/yellow discharge around incision site)   Complete by: As directed    Call MD for:  severe uncontrolled pain   Complete by: As directed    Call MD for:  temperature >100.4   Complete by: As directed    Increase activity slowly   Complete by: As directed      Allergies as of 01/12/2020   No Known Allergies     Medication List    STOP taking these medications   aspirin EC 81 MG tablet     TAKE these medications   amLODipine 2.5 MG tablet Commonly known as: NORVASC TAKE 1 TABLET (2.5 MG TOTAL) BY MOUTH DAILY.   cholecalciferol 10 MCG (400 UNIT) Tabs tablet Commonly known as: VITAMIN D3 Take 400 Units by mouth.   multivitamin with minerals Tabs tablet Take 1 tablet by mouth daily. Start taking on: January 13, 2020   polyethylene glycol powder 17 GM/SCOOP powder Commonly known as: GLYCOLAX/MIRALAX Take 17 g by mouth daily. As needed for constipation What changed: additional instructions   Spiriva Respimat 2.5 MCG/ACT Aers Generic drug: Tiotropium Bromide Monohydrate INHALE 2 PUFFS BY MOUTH INTO THE LUNGS DAILY What changed: See the new instructions.  No Known Allergies  Consultations:  Gastroenterology    Procedures/Studies: No results found.    Subjective:  Patient reports she feels okay.  She reports she is hungry this morning.  She reports had a bowel movement last night and this morning reports they were Desiree Hutchinson.  This is consistent with nursing reports   Discharge Exam: Vitals:   01/12/20 0844 01/12/20 1335  BP:  (!) 100/55  Pulse:  76   Resp:  16  Temp:  97.6 F (36.4 C)  SpO2: 100% 99%   Vitals:   01/11/20 2145 01/12/20 0439 01/12/20 0844 01/12/20 1335  BP: (!) 131/59 134/66  (!) 100/55  Pulse: 80 82  76  Resp: 14 16  16   Temp: 97.7 F (36.5 C) 98.2 F (36.8 C)  97.6 F (36.4 C)  TempSrc: Oral Oral  Oral  SpO2: 100% 100% 100% 99%  Weight:      Height:        General: Frail elderly.  Woman who is alert oriented to place and person. Cardiovascular: RRR, S1/S2 +, no rubs, no gallops Respiratory: CTA bilaterally, no wheezing, no rhonchi Abdominal: Soft, NT, ND, bowel sounds + Extremities: no edema, no cyanosis    The results of significant diagnostics from this hospitalization (including imaging, microbiology, ancillary and laboratory) are listed below for reference.     Microbiology: Recent Results (from the past 240 hour(s))  Respiratory Panel by RT PCR (Flu A&B, Covid) - Nasopharyngeal Swab     Status: None   Collection Time: 01/09/20  8:07 AM   Specimen: Nasopharyngeal Swab  Result Value Ref Range Status   SARS Coronavirus 2 by RT PCR NEGATIVE NEGATIVE Final    Comment: (NOTE) SARS-CoV-2 target nucleic acids are NOT DETECTED. The SARS-CoV-2 RNA is generally detectable in upper respiratoy specimens during the acute phase of infection. The lowest concentration of SARS-CoV-2 viral copies this assay can detect is 131 copies/mL. A negative result does not preclude SARS-Cov-2 infection and should not be used as the sole basis for treatment or other patient management decisions. A negative result may occur with  improper specimen collection/handling, submission of specimen other than nasopharyngeal swab, presence of viral mutation(s) within the areas targeted by this assay, and inadequate number of viral copies (<131 copies/mL). A negative result must be combined with clinical observations, patient history, and epidemiological information. The expected result is Negative. Fact Sheet for Patients:   01/11/20 Fact Sheet for Healthcare Providers:  https://www.moore.com/ This test is not yet ap proved or cleared by the https://www.young.biz/ FDA and  has been authorized for detection and/or diagnosis of SARS-CoV-2 by FDA under an Emergency Use Authorization (EUA). This EUA will remain  in effect (meaning this test can be used) for the duration of the COVID-19 declaration under Section 564(b)(1) of the Act, 21 U.S.C. section 360bbb-3(b)(1), unless the authorization is terminated or revoked sooner.    Influenza A by PCR NEGATIVE NEGATIVE Final   Influenza B by PCR NEGATIVE NEGATIVE Final    Comment: (NOTE) The Xpert Xpress SARS-CoV-2/FLU/RSV assay is intended as an aid in  the diagnosis of influenza from Nasopharyngeal swab specimens and  should not be used as a sole basis for treatment. Nasal washings and  aspirates are unacceptable for Xpert Xpress SARS-CoV-2/FLU/RSV  testing. Fact Sheet for Patients: [place] Fact Sheet for Healthcare Providers: https://www.moore.com/ This test is not yet approved or cleared by the https://www.young.biz/ FDA and  has been authorized for detection and/or diagnosis of SARS-CoV-2 by  FDA under an Emergency Use Authorization (EUA). This EUA will remain  in effect (meaning this test can be used) for the duration of the  Covid-19 declaration under Section 564(b)(1) of the Act, 21  U.S.C. section 360bbb-3(b)(1), unless the authorization is  terminated or revoked. Performed at [facility], [address] [place], [place] [address]      Labs: BNP (last 3 results) No results for input(s): BNP in the last 8760 hours. Basic Metabolic Panel: Recent Labs  Lab 01/08/20 2014 01/10/20 0231 01/11/20 0409  NA 136 144 142  K 4.4 4.8 4.1  CL 106 114* 113*  CO2 20* 22 20*  GLUCOSE 166* 92 87  BUN 24* 27* 23  CREATININE 1.36* 1.13* 1.18*  CALCIUM 8.8* 9.0  9.0   Liver Function Tests: Recent Labs  Lab 01/08/20 2014  AST 21  ALT 9  ALKPHOS 75  BILITOT 0.6  PROT 6.1*  ALBUMIN 3.1*  CBC: Recent Labs  Lab 01/08/20 2014 01/08/20 2014 01/09/20 0807 01/09/20 1441 01/10/20 0231 01/10/20 0231 01/10/20 1106 01/11/20 0409 01/11/20 1106 01/11/20 1855 01/12/20 0432  WBC 4.8   < > 6.0  --  4.8  --  4.8 4.2  --   --  4.5  NEUTROABS 2.4  --   --   --   --   --   --   --   --   --   --   HGB 9.8*   < > 10.1*   < > 8.2*   < > 9.1* 7.4* 7.9* 7.7* 7.7*  HCT 30.9*   < > 32.5*   < > 25.5*   < > 28.6* 22.8* 23.9* 23.9* 23.2*  MCV 105.1*   < > 104.8*  --  102.0*  --  102.9* 101.8*  --   --  101.3*  PLT 251   < > 255  --  230  --  261 204  --   --  213   < > = values in this interval not displayed.   Anemia work up Recent Labs    01/12/20 0853  FERRITIN 132   Time coordinating discharge: Over 30 minutes  SIGNED:   Jolan Upchurch M. Manson Passey,  MD  [facility] 01/12/2020, 5:22 PM Pager [phone]   If 7PM-7AM, please contact night-coverage [email or web address] Password [email or web address]

## 2020-01-12 NOTE — Discharge Instructions (Signed)
Please follow up with your primary care doctor this week.   Please have your blood count checked this week  Please continue a multivitamin   It was great to take care of you.   Terisa Starr, MD   Please come to the [facility] office building on [address] on  Wednesday, January 20 to have follow-up labs done.  We will call you with the results. Thank you, Mike Gip PA-C/Dr.Pyrtle

## 2020-01-14 ENCOUNTER — Other Ambulatory Visit: Payer: Self-pay

## 2020-01-14 ENCOUNTER — Telehealth: Payer: Self-pay

## 2020-01-14 DIAGNOSIS — K922 Gastrointestinal hemorrhage, unspecified: Secondary | ICD-10-CM

## 2020-01-14 NOTE — Telephone Encounter (Signed)
-----   Message from Sammuel Cooper, PA-C sent at 01/12/2020  3:35 PM EST ----- Regarding: labs Beth, patient is being discharged from the hospital this weekend after diverticular bleed.  Please place orders for CBC to be done on Wednesday, 01/16/20-can put under my name, GI bleed/anemia. Thank you

## 2020-01-17 ENCOUNTER — Other Ambulatory Visit (INDEPENDENT_AMBULATORY_CARE_PROVIDER_SITE_OTHER): Payer: Medicare Other

## 2020-01-17 DIAGNOSIS — K922 Gastrointestinal hemorrhage, unspecified: Secondary | ICD-10-CM

## 2020-01-17 LAB — CBC WITH DIFFERENTIAL/PLATELET
Basophils Absolute: 0.1 10*3/uL (ref 0.0–0.1)
Basophils Relative: 1.1 % (ref 0.0–3.0)
Eosinophils Absolute: 0.3 10*3/uL (ref 0.0–0.7)
Eosinophils Relative: 3.6 % (ref 0.0–5.0)
HCT: 28.8 % — ABNORMAL LOW (ref 36.0–46.0)
Hemoglobin: 9.4 g/dL — ABNORMAL LOW (ref 12.0–15.0)
Lymphocytes Relative: 32.2 % (ref 12.0–46.0)
Lymphs Abs: 2.4 10*3/uL (ref 0.7–4.0)
MCHC: 32.6 g/dL (ref 30.0–36.0)
MCV: 101.2 fl — ABNORMAL HIGH (ref 78.0–100.0)
Monocytes Absolute: 0.3 10*3/uL (ref 0.1–1.0)
Monocytes Relative: 4.4 % (ref 3.0–12.0)
Neutro Abs: 4.4 10*3/uL (ref 1.4–7.7)
Neutrophils Relative %: 58.7 % (ref 43.0–77.0)
Platelets: 323 10*3/uL (ref 150.0–400.0)
RBC: 2.85 Mil/uL — ABNORMAL LOW (ref 3.87–5.11)
RDW: 15.5 % (ref 11.5–15.5)
WBC: 7.5 10*3/uL (ref 4.0–10.5)

## 2020-01-21 NOTE — Progress Notes (Signed)
Please let patient and family know that her hemoglobin is much better since discharge from the hospital.  Up to 9.4.

## 2020-01-23 ENCOUNTER — Inpatient Hospital Stay: Payer: Medicare Other | Admitting: Family Medicine

## 2020-01-25 ENCOUNTER — Other Ambulatory Visit: Payer: Self-pay | Admitting: Cardiovascular Disease

## 2020-01-25 ENCOUNTER — Other Ambulatory Visit: Payer: Self-pay | Admitting: Pulmonary Disease

## 2020-01-29 ENCOUNTER — Other Ambulatory Visit: Payer: Self-pay

## 2020-01-31 ENCOUNTER — Ambulatory Visit (INDEPENDENT_AMBULATORY_CARE_PROVIDER_SITE_OTHER): Payer: Medicare Other | Admitting: Nurse Practitioner

## 2020-01-31 ENCOUNTER — Other Ambulatory Visit: Payer: Self-pay

## 2020-01-31 ENCOUNTER — Encounter: Payer: Self-pay | Admitting: Nurse Practitioner

## 2020-01-31 VITALS — BP 138/60 | HR 88 | Temp 98.2°F | Ht 58.75 in | Wt 89.5 lb

## 2020-01-31 DIAGNOSIS — K922 Gastrointestinal hemorrhage, unspecified: Secondary | ICD-10-CM

## 2020-01-31 NOTE — Progress Notes (Signed)
IMPRESSION and PLAN:    7.  86 year old female with a history of diverticular hemorrhage requiring embolization of ileocecal artery in 2017 . Hospitalized mid January with recurrent painless hematochezia felt to be another diverticular hemorrhage.  Resolved spontaneously -follow up prn  2. ABL anemia. Hgb improved from 7.4 at time of discharge to 9.4 a week later.   3. Covid positive study on 01/09/2020.  According to ED notes this was an error.  She was Hemoccult positive, not Covid positive  HPI:    Primary GI: Dr. Henrene Hutchinson  Chief complaint : Hospital follow-up on GI bleed   Desiree Hutchinson is a 86 y.o. female with a pmh significant for but not limited to chronic diastolic heart failure, COPD, hypertension, diverticulosis and history of major diverticular hemorrhage with shock in 2017 status post coil embolization of ileocecal artery.   Patient presented to the ED 01/09/2020 for evaluation of painless hematochezia.  In the ED she was hemodynamically stable.  Sending hemoglobin 9.8, down from baseline of 12.43 weeks earlier.  She was taking a daily baby aspirin which was discontinued.  Bleeding subsided, she was discharged home a couple of days later.  He did not require blood, hemoglobin at discharge was 7.7.  She returned to our office a week later for repeat labs.  Hemoglobin had improved to 9.4.   Desiree Hutchinson has not had any more bleeding.  Her bowel movements are overall normal on Metamucil twice daily.  She will take an Ex-Lax if goes 3 or more days without a bowel movement.  Data Reviewed:  Baseline hgb 12.4 >>>>> 7.4  in mid January during GI bleed. Now 9.4   Review of systems:     No chest pain, chronic SOB  Past Medical History:  Diagnosis Date  . Acute lower GI bleeding 01/09/2020  . Allergic rhinitis   . Anemia   . Aortic valve disease    regurge > stenosis  . Chest pain, non-cardiac   . Chronic diastolic heart failure ([place])   . COPD (chronic obstructive  pulmonary disease) ([place])   . Cough   . Gastritis   . History of atherosclerotic cardiovascular disease   . History of small bowel obstruction   . Hypertension   . Left ventricular hypertrophy   . Obesity   . Personal history of tobacco use, presenting hazards to health     Patient's surgical history, family medical history, social history, medications and allergies were all reviewed in Epic   Serum creatinine: 1.18 mg/dL (H) 01/11/20 0409 Estimated creatinine clearance: 17.5 mL/min (A)  Current Outpatient Medications  Medication Sig Dispense Refill  . amLODipine (NORVASC) 2.5 MG tablet TAKE 1 TABLET BY MOUTH EVERY DAY 90 tablet 0  . cholecalciferol (VITAMIN D) 400 units TABS tablet Take 400 Units by mouth.    . Multiple Vitamin (MULTIVITAMIN WITH MINERALS) TABS tablet Take 1 tablet by mouth daily. 90 tablet 0  . polyethylene glycol powder (GLYCOLAX/MIRALAX) 17 GM/SCOOP powder Take 17 g by mouth daily. As needed for constipation (Patient taking differently: Take 17 g by mouth as needed. As needed for constipation) 500 g 2  . Psyllium (METAMUCIL PO) Take 1 Dose by mouth 2 (two) times daily.    Marland Kitchen SPIRIVA RESPIMAT 2.5 MCG/ACT AERS INHALE 2 PUFFS BY MOUTH INTO THE LUNGS DAILY 4 g 3   No current facility-administered medications for this visit.    Physical Exam:     BP  138/60 (BP Location: Left Arm, Patient Position: Sitting, Cuff Size: Normal)   Pulse 88   Temp 98.2 F (36.8 C)   Ht 4' 10.75" (1.492 m) Comment: height measured without shoes  Wt 89 lb 8 oz (40.6 kg)   BMI 18.23 kg/m   GENERAL:  Pleasant female in NAD PSYCH: : Cooperative, normal affect CARDIAC:  RRR,  no peripheral edema PULM: Normal respiratory effort, a few fine bibasilar crackles.  ABDOMEN:  Examined in chair. Nondistended, soft, nontender,  normal bowel sounds SKIN:  turgor, no lesions seen Musculoskeletal:  Normal muscle tone, normal strength NEURO: Alert and oriented x 3, no focal neurologic  deficits   Desiree Hutchinson , NP 01/31/2020, 2:38 PM

## 2020-01-31 NOTE — Patient Instructions (Signed)
If you are age 84 or older, your body mass index should be between 23-30. Your Body mass index is 18.23 kg/m. If this is out of the aforementioned range listed, please consider follow up with your Primary Care Provider.  If you are age 29 or younger, your body mass index should be between 19-25. Your Body mass index is 18.23 kg/m. If this is out of the aformentioned range listed, please consider follow up with your Primary Care Provider.   Follow up as needed  Thank you for choosing me and [facility].   Willette Cluster, NP

## 2020-02-01 NOTE — Progress Notes (Signed)
Noted  

## 2020-02-04 NOTE — Progress Notes (Addendum)
Cardiology Office Note  Date: 02/05/2020   ID: Desiree Hutchinson, DOB 1920/02/08, MRN [id number]  PCP:  Girtha Rm, NP-C  Cardiologist:  No primary care provider on file. Electrophysiologist:  None   Chief Complaint  Patient presents with  . Follow-up    History of Present Illness: Desiree Hutchinson is a 86 y.o. female history of moderate aortic stenosis, hypertension, chronic diastolic heart failure.  Patient has a history of chronic shortness of breath.  Last echocardiogram 01/2019 showed an EF of 60 to 65%, impaired diastolic relaxation, severely dilated LA, severe MV annular calcification, AVR moderate to severe, AV valve area c/w moderate to severe stenosis.   Other medical history includes; anemia, chest pain,  COPD, obesity, tobacco use,small bowel obstruction, gastritis, ASCVD.  Patient with recent admission January 08, 2020 for GI bleed.  She had experienced grossly bloody bowel movements at home. She has a history of diverticulitis.  GI was consulted and recommended close monitoring.  Patient's hemoglobin did decrease but she required no transfusions.  She was discharged after 4 days.  Most recent H&H was 9.4 and 28.8 on January 17, 2020, improved from previous.  Her aspirin was discontinued.  She presents today for follow-up on history of aortic stenosis.  Her primary complaints are shortness of breath on exertion and chronic constipation.  She is taking MiraLAX and Metamucil at home for relief of constipation.   She is not very active on a daily basis and uses a walker to walk.  She denies any syncopal or near syncopal sensations, progressive anginal symptoms such as chest pain, chest pressure, chest tightness, neck, arm, jaw, or back pain when exerting.  The granddaughter who is with her states the patient's primary issue continues to be chronic dyspnea on exertion.  Past Medical History:  Diagnosis Date  . Acute lower GI bleeding 01/09/2020  . Allergic rhinitis   .  Anemia   . Aortic valve disease    regurge > stenosis  . Chest pain, non-cardiac   . Chronic diastolic heart failure ([place])   . COPD (chronic obstructive pulmonary disease) ([place])   . Cough   . Gastritis   . History of atherosclerotic cardiovascular disease   . History of small bowel obstruction   . Hypertension   . Left ventricular hypertrophy   . Obesity   . Personal history of tobacco use, presenting hazards to health     Past Surgical History:  Procedure Laterality Date  . adenoasine cardiolite  11/09/2004  . APPENDECTOMY    . BOWEL RESECTION N/A 08/10/2015   Procedure: SMALL BOWEL OBSTRUCTION;  Surgeon: Excell Seltzer, MD;  Location: [facility];  Service: General;  Laterality: N/A;  . ESOPHAGOGASTRODUODENOSCOPY  11/09/1999  . HERNIA REPAIR    . IR GENERIC HISTORICAL  09/12/2016   IR FLUORO GUIDE CV LINE RIGHT 09/12/2016 Jacqulynn Cadet, MD [facility]  . IR GENERIC HISTORICAL  09/12/2016   IR US GUIDE VASC ACCESS RIGHT 09/12/2016 Jacqulynn Cadet, MD [facility]  . IR GENERIC HISTORICAL  09/12/2016   IR ANGIOGRAM SELECTIVE EACH ADDITIONAL VESSEL 09/12/2016 Jacqulynn Cadet, MD [facility]  . IR GENERIC HISTORICAL  09/12/2016   IR ANGIOGRAM VISCERAL SELECTIVE 09/12/2016 Jacqulynn Cadet, MD [facility]  . IR GENERIC HISTORICAL  09/12/2016   IR ANGIOGRAM SELECTIVE EACH ADDITIONAL VESSEL 09/12/2016 Jacqulynn Cadet, MD [facility]  . IR GENERIC HISTORICAL  09/12/2016   IR ANGIOGRAM SELECTIVE EACH ADDITIONAL VESSEL 09/12/2016 Jacqulynn Cadet, MD [facility]  .  IR GENERIC HISTORICAL  09/12/2016   IR US GUIDE VASC ACCESS RIGHT 09/12/2016 Malachy Moan, MD [facility]  . IR GENERIC HISTORICAL  09/12/2016   IR ANGIOGRAM SELECTIVE EACH ADDITIONAL VESSEL 09/12/2016 Malachy Moan, MD [facility]  . IR GENERIC HISTORICAL  09/12/2016   IR EMBO ART  VEN HEMORR LYMPH EXTRAV  INC GUIDE ROADMAPPING 09/12/2016 Malachy Moan, MD [facility]  . IR GENERIC HISTORICAL  09/12/2016   IR  ANGIOGRAM SELECTIVE EACH ADDITIONAL VESSEL 09/12/2016 Malachy Moan, MD [facility]  . LAPAROTOMY  08/10/2015   Procedure: EXPLORATORY LAPAROTOMY;  Surgeon: Glenna Fellows, MD;  Location: [facility];  Service: General;;  . LYSIS OF ADHESION  08/10/2015   Procedure: LYSIS OF ADHESION;  Surgeon: Glenna Fellows, MD;  Location: [facility];  Service: General;;  . small bowel obstruction  06/1996  . transthoracic cardiolite   11/03/2006    Current Outpatient Medications  Medication Sig Dispense Refill  . amLODipine (NORVASC) 2.5 MG tablet TAKE 1 TABLET BY MOUTH EVERY DAY 90 tablet 0  . cholecalciferol (VITAMIN D) 400 units TABS tablet Take 400 Units by mouth.    . Multiple Vitamin (MULTIVITAMIN WITH MINERALS) TABS tablet Take 1 tablet by mouth daily. 90 tablet 0  . polyethylene glycol powder (GLYCOLAX/MIRALAX) 17 GM/SCOOP powder Take 17 g by mouth daily. As needed for constipation (Patient taking differently: Take 17 g by mouth as needed. As needed for constipation) 500 g 2  . Psyllium (METAMUCIL PO) Take 1 Dose by mouth 2 (two) times daily.    Marland Kitchen SPIRIVA RESPIMAT 2.5 MCG/ACT AERS INHALE 2 PUFFS BY MOUTH INTO THE LUNGS DAILY 4 g 3   No current facility-administered medications for this visit.   Allergies:  Patient has no known allergies.   Social History: The patient  reports that she quit smoking about 10 years ago. Her smoking use included cigarettes. She has a 3.00 pack-year smoking history. She has never used smokeless tobacco. She reports that she does not drink alcohol or use drugs.   Family History: The patient's family history is not on file.   ROS:  Please see the history of present illness. Otherwise, complete review of systems is positive for none.  All other systems are reviewed and negative.   Physical Exam: VS:  BP 132/60   Pulse 73   Ht 4\' 10"  (1.473 m)   Wt 88 lb (39.9 kg)   SpO2 99%   BMI 18.39 kg/m , BMI Body mass index is 18.39 kg/m.  Wt Readings from Last 3  Encounters:  02/05/20 88 lb (39.9 kg)  01/31/20 89 lb 8 oz (40.6 kg)  01/10/20 90 lb 13.3 oz (41.2 kg)    General: Patient appears comfortable at rest. Neck: Supple, no elevated JVP or carotid bruits, no thyromegaly. Lungs: Clear to auscultation except for mild fine crackles in left lower lung base posteriorly. nonlabored breathing at rest. Cardiac: Regular rate and rhythm, 4/6 significant systolic murmur heard thoughout all auscultation points, PMI displaced laterally, Palpable heave, no pericardial rub. Abdomen: Soft, nontender, no hepatomegaly, bowel sounds present, no guarding or rebound. Extremities: No pitting edema, distal pulses 2+. Skin: Warm and dry. Musculoskeletal: No kyphosis. Neuropsychiatric: Alert and oriented x3, affect grossly appropriate.  ECG:  An ECG dated 01/09/2020 was personally reviewed today and demonstrated:  Sinus rhythm rate of 87, abnormal R wave progression, early transition, borderline T wave abnormalities, prolonged QT interval with QT and QTc 454/547 ms respectively.  Recent Labwork: 01/08/2020: ALT 9; AST 21  01/11/2020: BUN 23; Creatinine, Ser 1.18; Potassium 4.1; Sodium 142 01/17/2020: Hemoglobin 9.4; Platelets 323.0     Component Value Date/Time   CHOL 198 01/10/2015 1611   TRIG 68 08/18/2015 0531   TRIG 103 10/27/2006 1030   HDL 88.60 01/10/2015 1611   CHOLHDL 2 01/10/2015 1611   VLDL 10.2 01/10/2015 1611   LDLCALC 99 01/10/2015 1611   LDLDIRECT 104.6 03/27/2013 1405    Other Studies Reviewed Today: Echocardiogram 02/01/2019  IMPRESSIONS  1. The left ventricle has normal systolic function of 60-65%. The cavity size was normal. There is mildly increased left ventricular wall thickness. Echo evidence of impaired diastolic relaxation Elevated left ventricular end-diastolic pressure.  2. The right ventricle has normal systolic function. The cavity was normal. There is no increase in right ventricular wall thickness. Right ventricular systolic  pressure normal with an estimated pressure of 21.0 mmHg.  3. Left atrial size was severely dilated.  4. The mitral valve is normal in structure. There is severe mitral annular calcification present.  5. The tricuspid valve is normal in structure.  6. The aortic valve is tricuspid There is severe thickening and severe calcifcation of the aortic valve, with severely decreased cusp excursion.Aortic valve regurgitation is moderate to severe by color flow Doppler.  The calculated aortic valve area is 0.96 cm, consistent with moderate-severe stenosis.  7. Aortic valve peak velocity 3.9 m/s. Mean gradient 35 mmHg.  8. The pulmonic valve was normal in structure. Pulmonic valve regurgitation is mild by color flow Doppler.  9. Compared with the echo 03/2017, aortic stenosis and aortic  regurgitation have progressed.  10. The average left ventricular global longitudinal strain is -17.9 %.    Assessment and Plan:  1. Aortic valve stenosis, etiology of cardiac valve disease unspecified   2. Essential hypertension   3. Chronic diastolic heart failure (HCC)    1. Aortic valve stenosis, etiology of cardiac valve disease unspecified Most recent echocardiogram February 01, 2019 demonstrated the aortic valve is tricuspid There is severe thickening and severe calcifcation of the aortic valve, with severely decreased cusp excursion.Aortic valve regurgitation is moderate to severe by color flow Doppler. The calculated aortic valve area is 0.96 cm, consistent with moderate-severe stenosis. . Aortic valve peak velocity 3.9 m/s. Mean gradient 35 mmHg.   Patient's primary symptom per her granddaughter is shortness of breath on exertion.  She denies any anginal symptoms such as chest pain, arm pain, neck, jaw, or back pain.  Denies any syncopal or near syncopal symptoms.  She is euvolemic without lower extremity edema.  Has some mild fine crackles in left lower lung base.  All other lung fields fields are clear to  auscultation.  Discussed reevaluation of aortic stenosis with echocardiogram and possible TAVR placement.  Granddaughter is the primary historian.  We both talked with the patient and patient prefers to defer echocardiogram and possible TAVR.  Advised patient and granddaughter if symptom spectrum worsens we could reexplore having these tests and procedures done in the future.  Patient and granddaughter verbalized understanding.  2. Essential hypertension Blood pressure today is 132/60.  Continue amlodipine 2.5 mg daily.  3. Chronic diastolic heart failure (HCC) History of chronic diastolic heart failure with recent echo in February 2020 with evidence of impaired diastolic relaxation Elevated left ventricular end-diastolic pressure.  Patient's only symptom is dyspnea on exertion.  As mentioned above she is euvolemic.  She is not very active on a daily basis.  She ambulates with a walker.  We will  continue to monitor patient symptoms and echocardiograms as needed based on patient preference and symptom severity.   Medication Adjustments/Labs and Tests Ordered: Current medicines are reviewed at length with the patient today.  Concerns regarding medicines are outlined above.    There are no Patient Instructions on file for this visit.       Signed, Rennis Harding, NP 02/05/2020 8:41 AM    [facility]

## 2020-02-05 ENCOUNTER — Other Ambulatory Visit: Payer: Self-pay

## 2020-02-05 ENCOUNTER — Ambulatory Visit (INDEPENDENT_AMBULATORY_CARE_PROVIDER_SITE_OTHER): Payer: Medicare Other | Admitting: Family Medicine

## 2020-02-05 ENCOUNTER — Encounter: Payer: Self-pay | Admitting: Physician Assistant

## 2020-02-05 VITALS — BP 132/60 | HR 73 | Ht <= 58 in | Wt 88.0 lb

## 2020-02-05 DIAGNOSIS — I5032 Chronic diastolic (congestive) heart failure: Secondary | ICD-10-CM | POA: Diagnosis not present

## 2020-02-05 DIAGNOSIS — I350 Nonrheumatic aortic (valve) stenosis: Secondary | ICD-10-CM

## 2020-02-05 DIAGNOSIS — I10 Essential (primary) hypertension: Secondary | ICD-10-CM | POA: Diagnosis not present

## 2020-02-05 NOTE — Patient Instructions (Signed)
Medication Instructions:   Your physician recommends that you continue on your current medications as directed. Please refer to the Current Medication list given to you today.  *If you need a refill on your cardiac medications before your next appointment, please call your pharmacy*  Lab Work:  None ordered today  If you have labs (blood work) drawn today and your tests are completely normal, you will receive your results only by: Marland Kitchen MyChart Message (if you have MyChart) OR . A paper copy in the mail If you have any lab test that is abnormal or we need to change your treatment, we will call you to review the results.  Testing/Procedures:  None ordered today  Follow-Up: At [facility], you and your health needs are our priority.  As part of our continuing mission to provide you with exceptional heart care, we have created designated Provider Care Teams.  These Care Teams include your primary Cardiologist (physician) and Advanced Practice Providers (APPs -  Physician Assistants and Nurse Practitioners) who all work together to provide you with the care you need, when you need it.  Your next appointment:   12 month(s)  The format for your next appointment:   In Person  Provider:   You may see Tonny Bollman, MD or one of the following Advanced Practice Providers on your designated Care Team:    Tereso Newcomer, PA-C  [place]in [place], [place]  Berton Bon, NP   Other Instructions  If you have any episodes of chest pain, fainting, or shortness of breath call our office.

## 2020-02-20 ENCOUNTER — Ambulatory Visit: Payer: Medicare Other | Admitting: Physician Assistant

## 2020-04-10 ENCOUNTER — Ambulatory Visit: Payer: Medicare Other | Attending: Internal Medicine

## 2020-04-10 DIAGNOSIS — Z23 Encounter for immunization: Secondary | ICD-10-CM

## 2020-04-10 NOTE — Progress Notes (Signed)
   Covid-19 Vaccination Clinic  Name:  Desiree Hutchinson    MRN: [id number] DOB: February 12, 1920  04/10/2020  Desiree Hutchinson was observed post Covid-19 immunization for 15 minutes without incident. She was provided with Vaccine Information Sheet and instruction to access the V-Safe system.   Desiree Hutchinson was instructed to call 911 with any severe reactions post vaccine: Marland Kitchen Difficulty breathing  . Swelling of face and throat  . A fast heartbeat  . A bad rash all over body  . Dizziness and weakness   Immunizations Administered    Name Date Dose VIS Date Route   Pfizer COVID-19 Vaccine 04/10/2020  4:49 PM 0.3 mL 12/07/2019 Intramuscular   Manufacturer: [employer], [employer]   Lot: [id number]   NDC: [address]-1

## 2020-05-05 ENCOUNTER — Ambulatory Visit: Payer: Medicare Other | Attending: Internal Medicine

## 2020-05-05 DIAGNOSIS — Z23 Encounter for immunization: Secondary | ICD-10-CM

## 2020-05-05 NOTE — Progress Notes (Signed)
   Covid-19 Vaccination Clinic  Name:  Desiree Hutchinson    MRN: [id number] DOB: 07-07-1920  05/05/2020  [name] was observed post Covid-19 immunization for 15 minutes without incident. She was provided with Vaccine Information Sheet and instruction to access the V-Safe system.   [name] was instructed to call 911 with any severe reactions post vaccine: Marland Kitchen Difficulty breathing  . Swelling of face and throat  . A fast heartbeat  . A bad rash all over body  . Dizziness and weakness   Immunizations Administered    Name Date Dose VIS Date Route   Pfizer COVID-19 Vaccine 05/05/2020  3:41 PM 0.3 mL 02/20/2019 Intramuscular   Manufacturer: [employer], [employer]   Lot: [id number]   NDC: [address]-7

## 2020-06-05 ENCOUNTER — Other Ambulatory Visit: Payer: Self-pay | Admitting: Pulmonary Disease

## 2020-06-20 ENCOUNTER — Other Ambulatory Visit: Payer: Self-pay | Admitting: Cardiovascular Disease

## 2020-06-26 ENCOUNTER — Telehealth: Payer: Self-pay | Admitting: Primary Care

## 2020-06-26 ENCOUNTER — Ambulatory Visit (INDEPENDENT_AMBULATORY_CARE_PROVIDER_SITE_OTHER): Payer: Medicare Other | Admitting: Primary Care

## 2020-06-26 ENCOUNTER — Other Ambulatory Visit: Payer: Self-pay

## 2020-06-26 ENCOUNTER — Ambulatory Visit (INDEPENDENT_AMBULATORY_CARE_PROVIDER_SITE_OTHER): Payer: Medicare Other

## 2020-06-26 ENCOUNTER — Encounter: Payer: Self-pay | Admitting: Primary Care

## 2020-06-26 VITALS — BP 124/46 | HR 81 | Temp 97.3°F | Ht 60.0 in | Wt 90.2 lb

## 2020-06-26 DIAGNOSIS — R0989 Other specified symptoms and signs involving the circulatory and respiratory systems: Secondary | ICD-10-CM

## 2020-06-26 DIAGNOSIS — J310 Chronic rhinitis: Secondary | ICD-10-CM

## 2020-06-26 DIAGNOSIS — J439 Emphysema, unspecified: Secondary | ICD-10-CM | POA: Diagnosis not present

## 2020-06-26 MED ORDER — SPIRIVA RESPIMAT 2.5 MCG/ACT IN AERS: INHALATION_SPRAY | RESPIRATORY_TRACT | 11 refills | Status: DC

## 2020-06-26 MED ORDER — SALINE SPRAY 0.65 % NA SOLN: 1.0000 | NASAL | 1 refills | Status: DC | PRN

## 2020-06-26 NOTE — Patient Instructions (Addendum)
Recommendations: Ocean nasal 1 puff per nostril as needed for post nasal drip/congestion  Orders: CXR today re: rales  Rx: Refill Spiriva Respimat - take two puffs once daily   Follow-up: 1 year with Dr. Isaiah Serge

## 2020-06-26 NOTE — Telephone Encounter (Signed)
Spoke with Desiree Hutchinson, I advised her that I didn't know why she was called. Will call pt back if we figure out the nature of the call. Will close encounter. Nothing further is needed.

## 2020-06-26 NOTE — Progress Notes (Signed)
@Patient  ID: , female    DOB: 02-27-1920, 86 y.o.   MRN: [id number]  Chief Complaint  Patient presents with  . Follow-up    pulmonary emphysema, doing good, no new issues    Referring provider: [id number], NP-C  HPI: 86 year old female, former smoker quit in 2011 (3-pack-year history).  Past medical history significant for COPD with emphysema, chronic rhinitis, aortic valve disorder, chronic diastolic heart failure, hypertension, left ventricular hypertrophy, gastritis, lower GI bleed, renal insufficiency, small bowel obstruction, weight loss.  Patient of Dr. 2012, last seen in June 2019.  Maintained on Spiriva Respimat 2.36mcg.  She is a DNR.   Previous [facility] pulmonary encounters: 06/13/20- Dr. 06/15/20 in July 2016 with an acute diverticular bleed. She was transfused but did not end up getting a colonoscopy. Her bleeding resolved spontaneously. She was also hospitalized in August 2016 with a small bowel obstruction. She this was initially managed conservatively however she had to undergo surgery with lysis of adhesions.Hospitalized in 2017 for lower GI bleed.  Last seen in the clinic in 2017.  Breathing is doing well on Spiriva with no new complaints. No hospitalizations or ER visits since 2017.   06/26/2020- interim hx Patient presents today for a regular office follow-up.  She has not been seen in our office in 2 years. Since that she was hospitalized once in January 2021 for GI bleed. She is doing well, needs refill of Spiriva. No acute complaints. No significant cough except for early morning; mostly clearing her throat. She does have occasional upper airway wheezing. She does not get out of breath, daughter states that she rarely has to stop. Ambulates with rolling walker.   Chest imaging:  09/12/16 CXR-  Chronic interstitial thickening.  No evidence of acute abnormality.  Cardiac testing: 02/01/19 Echocardiogram- EF 60-65%, impaired diastolic  relaxation, severe thickening and severe calcifcation of the aortic valve, Aortic valve regurgitation is moderate to severe  02/01/19 EKG-  normal sinus rhythm 81 bpm, nonspecific T wave abnormality with T wave flattening.  No significant ST change   No Known Allergies  Immunization History  Administered Date(s) Administered  . Fluad Quad(high Dose 65+) 10/26/2019  . Influenza, High Dose Seasonal PF 09/30/2016  . Influenza,inj,Quad PF,6+ Mos 12/18/2013, 10/02/2014, 11/10/2015, 01/16/2018  . PFIZER SARS-COV-2 Vaccination 04/10/2020, 05/05/2020  . Pneumococcal Conjugate-13 09/30/2016  . Pneumococcal Polysaccharide-23 08/27/1996  . Tdap 01/16/2018    Past Medical History:  Diagnosis Date  . Acute lower GI bleeding 01/09/2020  . Allergic rhinitis   . Anemia   . Aortic valve disease    regurge > stenosis  . Chest pain, non-cardiac   . Chronic diastolic heart failure (HCC)   . COPD (chronic obstructive pulmonary disease) (HCC)   . Cough   . Gastritis   . History of atherosclerotic cardiovascular disease   . History of small bowel obstruction   . Hypertension   . Left ventricular hypertrophy   . Obesity   . Personal history of tobacco use, presenting hazards to health     Tobacco History: Social History   Tobacco Use  Smoking Status Former Smoker  . Packs/day: 0.10  . Years: 30.00  . Pack years: 3.00  . Types: Cigarettes  . Quit date: 01/24/2010  . Years since quitting: 10.4  Smokeless Tobacco Never Used  Tobacco Comment   smoked 1-2 cigarettes/day   Counseling given: Not Answered Comment: smoked 1-2 cigarettes/day   Outpatient Medications Prior to Visit  Medication Sig  Dispense Refill  . amLODipine (NORVASC) 2.5 MG tablet TAKE 1 TABLET BY MOUTH EVERY DAY 90 tablet 1  . cholecalciferol (VITAMIN D) 400 units TABS tablet Take 400 Units by mouth.    . Multiple Vitamin (MULTIVITAMIN WITH MINERALS) TABS tablet Take 1 tablet by mouth daily. 90 tablet 0  . polyethylene  glycol powder (GLYCOLAX/MIRALAX) 17 GM/SCOOP powder Take 17 g by mouth daily. As needed for constipation (Patient taking differently: Take 17 g by mouth as needed. As needed for constipation) 500 g 2  . Psyllium (METAMUCIL PO) Take 1 Dose by mouth 2 (two) times daily.    Marland Kitchen SPIRIVA RESPIMAT 2.5 MCG/ACT AERS INHALE 2 PUFFS BY MOUTH INTO THE LUNGS DAILY 4 g 3   No facility-administered medications prior to visit.    Review of Systems  Review of Systems  Constitutional: Negative.   Respiratory: Positive for cough. Negative for shortness of breath.     Physical Exam  BP (!) 124/46 (BP Location: Left Arm, Cuff Size: Normal)   Pulse 81   Temp (!) 97.3 F (36.3 C) (Oral)   Ht 5' (1.524 m)   Wt 90 lb 3.2 oz (40.9 kg)   SpO2 98%   BMI 17.62 kg/m  Physical Exam Constitutional:      Appearance: Normal appearance.     Comments: Well appearing; well dressed  HENT:     Head: Normocephalic and atraumatic.  Pulmonary:     Effort: Pulmonary effort is normal.     Breath sounds: Normal breath sounds.     Comments: Fine crackles lung bases; no acute respiratory symptoms Musculoskeletal:     Comments: In WC  Neurological:     General: No focal deficit present.     Mental Status: She is alert. Mental status is at baseline.  Psychiatric:        Mood and Affect: Mood normal.        Thought Content: Thought content normal.      Lab Results:  CBC    Component Value Date/Time   WBC 7.5 01/17/2020 1641   RBC 2.85 (L) 01/17/2020 1641   HGB 9.4 (L) 01/17/2020 1641   HGB 12.4 12/17/2019 1116   HCT 28.8 (L) 01/17/2020 1641   HCT 38.2 12/17/2019 1116   PLT 323.0 01/17/2020 1641   PLT 215 12/17/2019 1116   MCV 101.2 (H) 01/17/2020 1641   MCV 101 (H) 12/17/2019 1116   MCH 33.6 01/12/2020 0432   MCHC 32.6 01/17/2020 1641   RDW 15.5 01/17/2020 1641   RDW 14.3 12/17/2019 1116   LYMPHSABS 2.4 01/17/2020 1641   LYMPHSABS 1.0 12/17/2019 1116   MONOABS 0.3 01/17/2020 1641   EOSABS 0.3  01/17/2020 1641   EOSABS 0.1 12/17/2019 1116   BASOSABS 0.1 01/17/2020 1641   BASOSABS 0.0 12/17/2019 1116    BMET    Component Value Date/Time   NA 142 01/11/2020 0409   NA 142 12/17/2019 1116   K 4.1 01/11/2020 0409   CL 113 (H) 01/11/2020 0409   CO2 20 (L) 01/11/2020 0409   GLUCOSE 87 01/11/2020 0409   GLUCOSE 94 10/27/2006 1030   BUN 23 01/11/2020 0409   BUN 18 12/17/2019 1116   CREATININE 1.18 (H) 01/11/2020 0409   CALCIUM 9.0 01/11/2020 0409   GFRNONAA 39 (L) 01/11/2020 0409   GFRAA 45 (L) 01/11/2020 0409    BNP    Component Value Date/Time   BNP 68.4 08/20/2015 1810    ProBNP    Component Value Date/Time  PROBNP 20.0 01/08/2014 1447    Imaging: DG Chest 2 View  Result Date: 06/26/2020 CLINICAL DATA:  Rales. EXAM: CHEST - 2 VIEW COMPARISON:  09/12/2016 FINDINGS: The cardiomediastinal silhouette is unchanged with normal heart size. Aortic atherosclerosis is noted. There is a background of chronic appearing interstitial densities throughout the lungs, however there are mildly increased opacities in the lower lungs bilaterally compared to the prior study. Calcified granulomas are again noted in the right lung. No pleural effusion or pneumothorax is identified. There is gaseous distension of multiple bowel loops in the upper abdomen which has been present on multiple prior examinations. An air-fluid level is also noted in the stomach. There is mild thoracic dextroscoliosis. IMPRESSION: Chronic interstitial lung disease with mildly increased bibasilar opacities which could reflect superimposed infection. Electronically Signed   By: Sebastian Ache M.D.   On: 06/26/2020 13:07     Assessment & Plan:   COPD with emphysema Gold Stage A - Stable interval; no shortness of breath, morning cough - Refill Spiriva Respimat - take two puffs once daily  - CXR today d/t rales on exam  - FU in 1 year with Dr. Isaiah Serge or sooner if needed   CHRONIC RHINITIS - Rx ocean nasal spray  twice daily   Glenford Bayley, NP 06/27/2020

## 2020-06-27 NOTE — Assessment & Plan Note (Signed)
-   Stable interval; no shortness of breath, morning cough - Refill Spiriva Respimat - take two puffs once daily  - CXR today d/t rales on exam  - FU in 1 year with Dr. Isaiah Serge or sooner if needed

## 2020-06-27 NOTE — Assessment & Plan Note (Signed)
-   Rx ocean nasal spray twice daily

## 2020-06-27 NOTE — Progress Notes (Signed)
Please let patient know CXR showed chronic changes, mildly increased at bases. I dont think she has a current infectoin. Monitor for purulent mucus, increased difficulty breathing or fever

## 2020-12-09 ENCOUNTER — Other Ambulatory Visit: Payer: Self-pay | Admitting: Cardiovascular Disease

## 2021-01-23 DIAGNOSIS — Z23 Encounter for immunization: Secondary | ICD-10-CM | POA: Diagnosis not present

## 2021-02-13 ENCOUNTER — Emergency Department (HOSPITAL_COMMUNITY)
Admission: EM | Admit: 2021-02-13 | Discharge: 2021-02-13 | Disposition: A | Discharge status: Home / Self Care | Payer: Medicare Other | Attending: Emergency Medicine | Admitting: Emergency Medicine

## 2021-02-13 ENCOUNTER — Other Ambulatory Visit: Payer: Self-pay

## 2021-02-13 ENCOUNTER — Emergency Department (HOSPITAL_COMMUNITY): Payer: Medicare Other

## 2021-02-13 ENCOUNTER — Encounter (HOSPITAL_COMMUNITY): Payer: Self-pay | Admitting: *Deleted

## 2021-02-13 DIAGNOSIS — H6123 Impacted cerumen, bilateral: Secondary | ICD-10-CM | POA: Insufficient documentation

## 2021-02-13 DIAGNOSIS — Z87891 Personal history of nicotine dependence: Secondary | ICD-10-CM | POA: Insufficient documentation

## 2021-02-13 DIAGNOSIS — R519 Headache, unspecified: Secondary | ICD-10-CM | POA: Insufficient documentation

## 2021-02-13 DIAGNOSIS — I5032 Chronic diastolic (congestive) heart failure: Secondary | ICD-10-CM | POA: Insufficient documentation

## 2021-02-13 DIAGNOSIS — H9201 Otalgia, right ear: Secondary | ICD-10-CM | POA: Diagnosis present

## 2021-02-13 DIAGNOSIS — D631 Anemia in chronic kidney disease: Secondary | ICD-10-CM | POA: Insufficient documentation

## 2021-02-13 DIAGNOSIS — L03811 Cellulitis of head [any part, except face]: Secondary | ICD-10-CM | POA: Diagnosis not present

## 2021-02-13 DIAGNOSIS — Z79899 Other long term (current) drug therapy: Secondary | ICD-10-CM | POA: Diagnosis not present

## 2021-02-13 DIAGNOSIS — N189 Chronic kidney disease, unspecified: Secondary | ICD-10-CM | POA: Insufficient documentation

## 2021-02-13 DIAGNOSIS — I130 Hypertensive heart and chronic kidney disease with heart failure and stage 1 through stage 4 chronic kidney disease, or unspecified chronic kidney disease: Secondary | ICD-10-CM | POA: Diagnosis not present

## 2021-02-13 DIAGNOSIS — Z7951 Long term (current) use of inhaled steroids: Secondary | ICD-10-CM | POA: Insufficient documentation

## 2021-02-13 DIAGNOSIS — J449 Chronic obstructive pulmonary disease, unspecified: Secondary | ICD-10-CM | POA: Diagnosis not present

## 2021-02-13 MED ORDER — CEPHALEXIN 250 MG PO CAPS: 250.0000 mg | ORAL_CAPSULE | Freq: Three times a day (TID) | ORAL | 0 refills | Status: DC

## 2021-02-13 NOTE — ED Notes (Signed)
ED Provider at bedside. 

## 2021-02-13 NOTE — Discharge Instructions (Signed)
There may be a little bit of infection from cracked skin from all the dry skin around your ears and scalp.  Also both of your ears are blocked with wax.  You can speak with ENT about getting your ears cleaned out.  However in the meantime if you wanted take a solution with half water and half peroxide and put several drops in each ear several times a day that will loosen the wax and make it easier for removal.

## 2021-02-13 NOTE — ED Notes (Signed)
Patient transported to CT 

## 2021-02-13 NOTE — ED Provider Notes (Signed)
[facility]-EMERGENCY DEPT Provider Note   CSN: [id number] Arrival date & time: 02/13/21  1741     History Chief Complaint  Patient presents with  . tenderness behind rt ear    Desiree Hutchinson is a 87 y.o. female.  Patient is a 87 year old female with a history of CHF, COPD, prior GI bleeding, anemia who is presenting today with complaints of pain behind her right ear.  Patient told family about it today but reports its been there approximately 2 weeks.  It is clear if it has been the same severity the whole time or if it has been gradually getting worse.  She denies any fever, headache, nausea or vomiting.  Family has not noticed any lesions or drainage.  They otherwise feel that she is at her baseline with her breathing.  She has had no abdominal pain or chest pain.  They spoke with their doctor's office today and they recommend she come to the emergency room for a CT.  The history is provided by the patient and a relative.       Past Medical History:  Diagnosis Date  . Acute lower GI bleeding 01/09/2020  . Allergic rhinitis   . Anemia   . Aortic valve disease    regurge > stenosis  . Chest pain, non-cardiac   . Chronic diastolic heart failure (HCC)   . COPD (chronic obstructive pulmonary disease) (HCC)   . Cough   . Gastritis   . History of atherosclerotic cardiovascular disease   . History of small bowel obstruction   . Hypertension   . Left ventricular hypertrophy   . Obesity   . Personal history of tobacco use, presenting hazards to health     Patient Active Problem List   Diagnosis Date Noted  . Protein-calorie malnutrition, severe 01/11/2020  . Do not resuscitate 01/09/2020  . Asymptomatic menopausal state 06/20/2017  . Hypocalcemia 09/30/2016  . UTI (urinary tract infection) 09/30/2016  . Hematochezia   . Leukocytopenia   . Thrombocytopenia (HCC)   . GI bleed 09/12/2016  . Acute GI bleeding 09/12/2016  . Hemorrhagic shock (HCC)   .  PAD (peripheral artery disease) (HCC) 04/06/2016  . Iron deficiency anemia 02/13/2016  . Urinary incontinence 09/26/2015  . Chronic diastolic CHF (congestive heart failure) (HCC) 08/20/2015  . SBO (small bowel obstruction) (HCC) 08/06/2015  . Acute kidney injury superimposed on chronic kidney disease (HCC) 08/06/2015  . Protein-calorie malnutrition (HCC) 07/21/2015  . Lower GI bleed 07/19/2015  . Diverticulosis of colon with hemorrhage 07/19/2015  . Acute blood loss anemia 07/19/2015  . Constipation 01/10/2015  . Loss of weight 01/10/2015  . Memory loss of 01/10/2015  . Hearing loss 01/08/2014  . Renal insufficiency 03/27/2013  . Encounter for long-term (current) use of other medications 03/27/2013  . Aortic valve disorder 06/15/2010  . CHRONIC RHINITIS 06/26/2009  . SHORTNESS OF BREATH 05/29/2009  . OTHER DYSPNEA AND RESPIRATORY ABNORMALITIES 05/29/2009  . PRECORDIAL PAIN 05/29/2009  . COPD with emphysema Gold Stage A 05/28/2009  . SMALL BOWEL OBSTRUCTION, HX OF 05/28/2009  . GASTRITIS 03/28/2008  . COUGH 12/07/2007  . Essential hypertension 10/14/2007  . Hypotension 10/14/2007  . VENTRICULAR HYPERTROPHY, LEFT 10/14/2007  . ALLERGIC RHINITIS 10/14/2007  . CHEST PAIN, NON-CARDIAC 10/14/2007  . TOBACCO USE, QUIT 10/14/2007    Past Surgical History:  Procedure Laterality Date  . adenoasine cardiolite  11/09/2004  . APPENDECTOMY    . BOWEL RESECTION N/A 08/10/2015   Procedure: SMALL BOWEL OBSTRUCTION;  Surgeon: Glenna Fellows, MD;  Location: [facility];  Service: General;  Laterality: N/A;  . ESOPHAGOGASTRODUODENOSCOPY  11/09/1999  . HERNIA REPAIR    . IR GENERIC HISTORICAL  09/12/2016   IR FLUORO GUIDE CV LINE RIGHT 09/12/2016 Malachy Moan, MD [facility]  . IR GENERIC HISTORICAL  09/12/2016   IR US GUIDE VASC ACCESS RIGHT 09/12/2016 Malachy Moan, MD [facility]  . IR GENERIC HISTORICAL  09/12/2016   IR ANGIOGRAM SELECTIVE EACH ADDITIONAL VESSEL 09/12/2016 Malachy Moan, MD [facility]  . IR GENERIC HISTORICAL  09/12/2016   IR ANGIOGRAM VISCERAL SELECTIVE 09/12/2016 Malachy Moan, MD [facility]  . IR GENERIC HISTORICAL  09/12/2016   IR ANGIOGRAM SELECTIVE EACH ADDITIONAL VESSEL 09/12/2016 Malachy Moan, MD [facility]  . IR GENERIC HISTORICAL  09/12/2016   IR ANGIOGRAM SELECTIVE EACH ADDITIONAL VESSEL 09/12/2016 Malachy Moan, MD [facility]  . IR GENERIC HISTORICAL  09/12/2016   IR US GUIDE VASC ACCESS RIGHT 09/12/2016 Malachy Moan, MD [facility]  . IR GENERIC HISTORICAL  09/12/2016   IR ANGIOGRAM SELECTIVE EACH ADDITIONAL VESSEL 09/12/2016 Malachy Moan, MD [facility]  . IR GENERIC HISTORICAL  09/12/2016   IR EMBO ART  VEN HEMORR LYMPH EXTRAV  INC GUIDE ROADMAPPING 09/12/2016 Malachy Moan, MD [facility]  . IR GENERIC HISTORICAL  09/12/2016   IR ANGIOGRAM SELECTIVE EACH ADDITIONAL VESSEL 09/12/2016 Malachy Moan, MD [facility]  . LAPAROTOMY  08/10/2015   Procedure: EXPLORATORY LAPAROTOMY;  Surgeon: Glenna Fellows, MD;  Location: [facility];  Service: General;;  . LYSIS OF ADHESION  08/10/2015   Procedure: LYSIS OF ADHESION;  Surgeon: Glenna Fellows, MD;  Location: [facility];  Service: General;;  . small bowel obstruction  06/1996  . transthoracic cardiolite   11/03/2006     OB History   No obstetric history on file.     No family history on file.  Social History   Tobacco Use  . Smoking status: Former Smoker    Packs/day: 0.10    Years: 30.00    Pack years: 3.00    Types: Cigarettes    Quit date: 01/24/2010    Years since quitting: 11.0  . Smokeless tobacco: Never Used  . Tobacco comment: smoked 1-2 cigarettes/day  Vaping Use  . Vaping Use: Never used  Substance Use Topics  . Alcohol use: No    Alcohol/week: 0.0 standard drinks  . Drug use: No    Home Medications Prior to Admission medications   Medication Sig Start Date End Date Taking? Authorizing Provider  amLODipine (NORVASC) 2.5 MG tablet  TAKE 1 TABLET BY MOUTH EVERY DAY 12/09/20   Tonny Bollman, MD  cholecalciferol (VITAMIN D) 400 units TABS tablet Take 400 Units by mouth.    [provider]  Multiple Vitamin (MULTIVITAMIN WITH MINERALS) TABS tablet Take 1 tablet by mouth daily. 01/13/20   Westley Chandler, MD  polyethylene glycol powder (GLYCOLAX/MIRALAX) 17 GM/SCOOP powder Take 17 g by mouth daily. As needed for constipation Patient taking differently: Take 17 g by mouth as needed. As needed for constipation 01/12/20   Westley Chandler, MD  Psyllium (METAMUCIL PO) Take 1 Dose by mouth 2 (two) times daily.    [provider]  sodium chloride (OCEAN) 0.65 % SOLN nasal spray Place 1 spray into both nostrils as needed for congestion. 06/26/20   Glenford Bayley, NP  Tiotropium Bromide Monohydrate (SPIRIVA RESPIMAT) 2.5 MCG/ACT AERS INHALE 2 PUFFS BY MOUTH INTO THE LUNGS DAILY 06/26/20   Ames Dura  W, NP    Allergies    Patient has no known allergies.  Review of Systems   Review of Systems  All other systems reviewed and are negative.   Physical Exam Updated Vital Signs BP (!) 194/82 (BP Location: Right Arm)   Pulse 77   Temp (!) 97.4 F (36.3 C) (Oral)   Resp 16   Ht 5' (1.524 m)   Wt 42.6 kg   SpO2 95%   BMI 18.36 kg/m   Physical Exam Vitals and nursing note reviewed.  Constitutional:      General: She is not in acute distress.    Appearance: She is well-developed, normal weight and well-nourished.     Comments: Pleasant elderly female in no acute distress.  HENT:     Head: Normocephalic and atraumatic.      Right Ear: There is impacted cerumen.     Left Ear: There is impacted cerumen.  Eyes:     Extraocular Movements: EOM normal.     Pupils: Pupils are equal, round, and reactive to light.  Neck:     Comments: No notable preauricular or supraclavicular lymph nodes palpable Cardiovascular:     Rate and Rhythm: Normal rate and regular rhythm.     Pulses: Intact distal pulses.      Heart sounds: Normal heart sounds. No murmur heard. No friction rub.  Pulmonary:     Effort: Pulmonary effort is normal.     Breath sounds: Normal breath sounds. No wheezing or rales.  Musculoskeletal:        General: No tenderness. Normal range of motion.     Cervical back: Normal range of motion and neck supple.     Comments: No edema  Skin:    General: Skin is warm and dry.     Findings: No rash.     Comments: Patchy dry skin over the extremities and shoulders  Neurological:     Mental Status: She is alert and oriented to person, place, and time. Mental status is at baseline.  Psychiatric:        Mood and Affect: Mood and affect and mood normal.        Behavior: Behavior normal.        Thought Content: Thought content normal.     ED Results / Procedures / Treatments   Labs (all labs ordered are listed, but only abnormal results are displayed) Labs Reviewed - No data to display  EKG None  Radiology No results found.  Procedures Procedures   Medications Ordered in ED Medications - No data to display  ED Course  I have reviewed the triage vital signs and the nursing notes.  Pertinent labs & imaging results that were available during my care of the patient were reviewed by me and considered in my medical decision making (see chart for details).    MDM Rules/Calculators/A&P                          Elderly female presenting today with complaint of pain behind her right ear.  This has been present for approximately 2 weeks per the patient.  She denies any hearing changes, fever, cough.  She is not had any dizziness or headaches.  Family reports there is no other issues at this time but they did speak with her doctor today and they sent her here to get a CT scan.  Patient on exam has a point tender area behind her right ear  but there is no evidence of abscess at this time.  No palpable lymph nodes.  No mastoid tenderness.  Patient's bilateral ears are obstructed with  cerumen but at this time low suspicion for ear infection.  We will do CT as requested by her family doctor.  Suspect possible early infection versus lymphadenitis.  No evidence of shingles at this time.  9:03 PM Ct is neg.  Will d/c pt home with course of abx.  MDM Number of Diagnoses or Management Options   Amount and/or Complexity of Data Reviewed Tests in the radiology section of CPT: ordered and reviewed Independent visualization of images, tracings, or specimens: yes     Final Clinical Impression(s) / ED Diagnoses Final diagnoses:  Cellulitis of head or scalp    Rx / DC Orders ED Discharge Orders         Ordered    cephALEXin (KEFLEX) 250 MG capsule  3 times daily        02/13/21 2102           Gwyneth SproutPlunkett, Nial Hawe, MD 02/13/21 2103

## 2021-02-13 NOTE — ED Triage Notes (Signed)
Pt presents with ? Knot behind rt ear, tenderness noted in area. [place] Family medicine requested she come her for CT scan

## 2021-02-25 DIAGNOSIS — H6123 Impacted cerumen, bilateral: Secondary | ICD-10-CM | POA: Diagnosis not present

## 2021-03-04 ENCOUNTER — Other Ambulatory Visit: Payer: Self-pay | Admitting: Cardiovascular Disease

## 2021-04-08 ENCOUNTER — Encounter: Payer: Self-pay | Admitting: Physician Assistant

## 2021-04-08 ENCOUNTER — Other Ambulatory Visit: Payer: Self-pay

## 2021-04-08 ENCOUNTER — Ambulatory Visit (INDEPENDENT_AMBULATORY_CARE_PROVIDER_SITE_OTHER): Payer: Medicare Other | Admitting: Physician Assistant

## 2021-04-08 VITALS — BP 120/50 | HR 88 | Ht 60.0 in | Wt 89.9 lb

## 2021-04-08 DIAGNOSIS — I350 Nonrheumatic aortic (valve) stenosis: Secondary | ICD-10-CM

## 2021-04-08 NOTE — Patient Instructions (Signed)
Medication Instructions:  Your physician recommends that you continue on your current medications as directed. Please refer to the Current Medication list given to you today.  *If you need a refill on your cardiac medications before your next appointment, please call your pharmacy*   Lab Work: -None  If you have labs (blood work) drawn today and your tests are completely normal, you will receive your results only by: . MyChart Message (if you have MyChart) OR . A paper copy in the mail If you have any lab test that is abnormal or we need to change your treatment, we will call you to review the results.   Testing/Procedures: -None   Follow-Up: At [facility], you and your health needs are our priority.  As part of our continuing mission to provide you with exceptional heart care, we have created designated Provider Care Teams.  These Care Teams include your primary Cardiologist (physician) and Advanced Practice Providers (APPs -  Physician Assistants and Nurse Practitioners) who all work together to provide you with the care you need, when you need it.  We recommend signing up for the patient portal called "MyChart".  Sign up information is provided on this After Visit Summary.  MyChart is used to connect with patients for Virtual Visits (Telemedicine).  Patients are able to view lab/test results, encounter notes, upcoming appointments, etc.  Non-urgent messages can be sent to your provider as well.   To learn more about what you can do with MyChart, go to [email or web address].    Your next appointment:   1 year(s)  The format for your next appointment:   In Person  Provider:   You may see Michael Cooper, MD or one of the following Advanced Practice Providers on your designated Care Team:    Scott Weaver, PA-C    Other Instructions Your physician wants you to follow-up in: 1 year with Dr. Cooper or Scott Weaver, PA.  You will receive a reminder letter in the mail two  months in advance. If you don't receive a letter, please call our office to schedule the follow-up appointment.   

## 2021-04-08 NOTE — Progress Notes (Signed)
Cardiology Office Note:    Date:  04/08/2021   ID:  Desiree Hutchinson, DOB [birth year], MRN [id number]  PCP:  Desiree Shackleton, NP-C   [facility]  Cardiologist:  Desiree Bollman, MD   Advanced Practice Provider:  Beatrice Lecher, PA-C Electrophysiologist:  None       Referring MD: Desiree Shackleton, NP-C   Chief Complaint:  Follow-up (Aortic stenosis)    Patient Profile:     Desiree Hutchinson is a 87 y.o. female with:   Aortic stenosis  Echocardiogram 01/2019: mod to severe AS (mean 35 mmHg, DI 0.31, Vmax 393 cm/s)  (HFpEF) heart failure with preserved ejection fraction   Hypertension  COPD  History of SBO  History of GI bleed  Diverticulosis  Prior CV studies: Echocardiogram 02/01/2019 EF 60-65, mild LVH, Gr 1 DD, RVSP 21, severe BAE, mod to severe AI, mod to severe AS (mean 35 mmHg, Vmax 393 cm/s, DI 0.31), GLS-17.9%    History of Present Illness:    Desiree Hutchinson was last seen in clinic by Desiree Polio, NP in 01/2020.  She returns for f/u.  She is here with her granddaughter.  She has not had chest pain, significant shortness of breath, syncope, leg edema.          Past Medical History:  Diagnosis Date  . Acute lower GI bleeding 01/09/2020  . Allergic rhinitis   . Anemia   . Aortic valve disease    regurge > stenosis  . Chest pain, non-cardiac   . Chronic diastolic heart failure (HCC)   . COPD (chronic obstructive pulmonary disease) (HCC)   . Cough   . Gastritis   . History of atherosclerotic cardiovascular disease   . History of small bowel obstruction   . Hypertension   . Left ventricular hypertrophy   . Obesity   . Personal history of tobacco use, presenting hazards to health     Current Medications: Current Meds  Medication Sig  . amLODipine (NORVASC) 2.5 MG tablet Take 1 tablet (2.5 mg total) by mouth daily. Please keep upcoming appt in April 2022 with Cardiologist before anymore refills. Thank you  . cholecalciferol (VITAMIN D)  400 units TABS tablet Take 400 Units by mouth.  . Multiple Vitamin (MULTIVITAMIN WITH MINERALS) TABS tablet Take 1 tablet by mouth daily.  . polyethylene glycol powder (GLYCOLAX/MIRALAX) 17 GM/SCOOP powder Take 17 g by mouth daily. As needed for constipation  . Psyllium (METAMUCIL PO) Take 1 Dose by mouth 2 (two) times daily.  . sodium chloride (OCEAN) 0.65 % SOLN nasal spray Place 1 spray into both nostrils as needed for congestion.  . Tiotropium Bromide Monohydrate (SPIRIVA RESPIMAT) 2.5 MCG/ACT AERS INHALE 2 PUFFS BY MOUTH INTO THE LUNGS DAILY     Allergies:   Patient has no known allergies.   Social History   Tobacco Use  . Smoking status: Former Smoker    Packs/day: 0.10    Years: 30.00    Pack years: 3.00    Types: Cigarettes    Quit date: 01/24/2010    Years since quitting: 11.2  . Smokeless tobacco: Never Used  . Tobacco comment: smoked 1-2 cigarettes/day  Vaping Use  . Vaping Use: Never used  Substance Use Topics  . Alcohol use: No    Alcohol/week: 0.0 standard drinks  . Drug use: No     Family Hx: The patient's family history is not on file.  ROS   EKGs/Labs/Other Test Reviewed:  EKG:  EKG is   ordered today.  The ekg ordered today demonstrates NSR, HR 88, normal axis, nonspecific ST-T wave changes, QTC 459, no changes  Recent Labs: No results found for requested labs within last 8760 hours.   Recent Lipid Panel Lab Results  Component Value Date/Time   CHOL 198 01/10/2015 04:11 PM   TRIG 68 08/18/2015 05:31 AM   TRIG 103 10/27/2006 10:30 AM   HDL 88.60 01/10/2015 04:11 PM   CHOLHDL 2 01/10/2015 04:11 PM   LDLCALC 99 01/10/2015 04:11 PM   LDLDIRECT 104.6 03/27/2013 02:05 PM      Risk Assessment/Calculations:      Physical Exam:    VS:  BP (!) 120/50   Pulse 88   Ht 5' (1.524 m)   Wt 89 lb 14.4 oz (40.8 kg)   SpO2 95%   BMI 17.56 kg/m     Wt Readings from Last 3 Encounters:  04/08/21 89 lb 14.4 oz (40.8 kg)  02/13/21 94 lb (42.6 kg)   06/26/20 90 lb 3.2 oz (40.9 kg)     Constitutional:      Appearance: Healthy appearance. Not in distress.  Neck:     Vascular: No JVR.  Pulmonary:     Effort: Pulmonary effort is normal.     Breath sounds: No wheezing. No rales.  Cardiovascular:     Normal rate. Regular rhythm. Normal S1. Normal S2.     Murmurs: There is a grade 1/6 crescendo-decrescendo systolic murmur at the URSB.  Edema:    Peripheral edema absent.  Abdominal:     Palpations: Abdomen is soft.  Skin:    General: Skin is warm and dry.  Neurological:     Mental Status: Alert and oriented to person, place and time.     Cranial Nerves: Cranial nerves are intact.          ASSESSMENT & PLAN:    1. Nonrheumatic aortic valve stenosis Moderate to severe aortic stenosis by echocardiogram in 01/2019.  I cannot hear her murmur that prominently today on exam.  Overall, her symptoms are stable.  We discussed management of aortic stenosis.  I asked her if she would ever consider TAVR.  She is not interested in pursuing any procedures.  Therefore, I do not think it is necessary to repeat her echocardiogram.  She knows to contact us if her symptoms should change or if she changes her mind and would like to pursue follow-up echocardiography.  Follow-up in 1 year.   Dispo:  Return in about 1 year (around 04/08/2022) for Routine Follow Up, w/ Dr. Excell Seltzer, or Desiree Newcomer, PA-C, in person.   Medication Adjustments/Labs and Tests Ordered: Current medicines are reviewed at length with the patient today.  Concerns regarding medicines are outlined above.  Tests Ordered: Orders Placed This Encounter  Procedures  . EKG 12-Lead   Medication Changes: No orders of the defined types were placed in this encounter.   Signed, Desiree Newcomer, PA-C  04/08/2021 3:25 PM    [facility] [address] [place], [place], [place]  [address] Phone: [phone]; Fax: [phone]

## 2021-04-16 ENCOUNTER — Other Ambulatory Visit: Payer: Self-pay | Admitting: Primary Care

## 2021-05-31 ENCOUNTER — Other Ambulatory Visit: Payer: Self-pay | Admitting: Cardiovascular Disease

## 2021-07-12 ENCOUNTER — Other Ambulatory Visit: Payer: Self-pay | Admitting: Primary Care

## 2021-08-22 ENCOUNTER — Other Ambulatory Visit: Payer: Self-pay | Admitting: Primary Care

## 2021-10-30 ENCOUNTER — Ambulatory Visit (INDEPENDENT_AMBULATORY_CARE_PROVIDER_SITE_OTHER): Payer: Medicare Other | Admitting: Pulmonary Disease

## 2021-10-30 ENCOUNTER — Encounter: Payer: Self-pay | Admitting: Pulmonary Disease

## 2021-10-30 ENCOUNTER — Other Ambulatory Visit: Payer: Self-pay

## 2021-10-30 VITALS — BP 140/60 | HR 79 | Temp 98.0°F | Ht 60.0 in | Wt 111.0 lb

## 2021-10-30 DIAGNOSIS — Z23 Encounter for immunization: Secondary | ICD-10-CM | POA: Diagnosis not present

## 2021-10-30 DIAGNOSIS — J439 Emphysema, unspecified: Secondary | ICD-10-CM | POA: Diagnosis not present

## 2021-10-30 MED ORDER — SPIRIVA RESPIMAT 2.5 MCG/ACT IN AERS: INHALATION_SPRAY | RESPIRATORY_TRACT | 6 refills | Status: DC

## 2021-10-30 NOTE — Progress Notes (Signed)
Desiree Hutchinson    [id number]    1920/04/17  Primary Care Physician:Henson, Zorita Pang, PA-C  Referring Physician: Avanell Shackleton, PA-C No address on file  Chief complaint: Follow-up for COPD  HPI: Desiree Hutchinson is a 87 year old with mild emphysema. Gold class B. She's been maintained on Spiriva with improvement in symptoms. She does not have any recent exacerbations or ER visits for resp issues. She was hospitalized in July 2016 with an acute diverticular bleed. She was transfused but did not end up getting a colonoscopy. Her bleeding resolved spontaneously. She was also hospitalized in August 2016 with a small bowel obstruction. She this was initially managed conservatively however she had to undergo surgery with lysis of adhesions. Hospitalized in 2017 for lower breathing is doing well without issue  Interim history: Complains of mild increase in dyspnea on exertion Otherwise breathing is doing okay She is having it harder to use Spiriva inhaler due to lack of coordination  Outpatient Encounter Medications as of 10/30/2021  Medication Sig   amLODipine (NORVASC) 2.5 MG tablet Take 1 tablet (2.5 mg total) by mouth daily.   cholecalciferol (VITAMIN D) 400 units TABS tablet Take 400 Units by mouth.   [employer] SALINE NASAL SPRAY 0.65 % nasal spray PLACE 1 SPRAY INTO BOTH NOSTRILS AS NEEDED FOR CONGESTION.   Multiple Vitamin (MULTIVITAMIN WITH MINERALS) TABS tablet Take 1 tablet by mouth daily.   polyethylene glycol powder (GLYCOLAX/MIRALAX) 17 GM/SCOOP powder Take 17 g by mouth daily. As needed for constipation   Psyllium (METAMUCIL PO) Take 1 Dose by mouth 2 (two) times daily.   Tiotropium Bromide Monohydrate (SPIRIVA RESPIMAT) 2.5 MCG/ACT AERS INHALE 2 PUFFS BY MOUTH INTO THE LUNGS DAILY   No facility-administered encounter medications on file as of 10/30/2021.    Physical Exam: Blood pressure 140/60, pulse 79, temperature 98 F (36.7 C), temperature source Oral, height 5'  (1.524 m), weight 111 lb (50.3 kg), SpO2 99 %. Gen:      No acute distress HEENT:  EOMI, sclera anicteric Neck:     No masses; no thyromegaly Lungs:    Clear to auscultation bilaterally; normal respiratory effort CV:         Regular rate and rhythm; no murmurs Abd:      + bowel sounds; soft, non-tender; no palpable masses, no distension Ext:    No edema; adequate peripheral perfusion Skin:      Warm and dry; no rash Neuro: alert and oriented x 3 Psych: normal mood and affect   Data Reviewed: Spirometry 01/08/14 FVC 2.08 [119%], FEV1 1.06 [89%], F/F 51 Mild obstruction   Chest x-ray 08/06/15- Mild bibasilar atelectasis, mild vascular congestion. Chest x-ray 09/12/2016- chronic interstitial changes, calcified granuloma in the right midlung. I have reviewed the images personally.  Assessment:  Mild COPD Continues on Spiriva.  And not sure if she is getting the full dose of medication due to loss of coordination   We can consider Yupelri nebs.  Desiree Hutchinson is here with her today and not sure if they can afford any co-pays it is not covered by insurance. Put in an [place] and nebulizer order for DME company and patient and family will decide if they want to go ahead with this or continue with the Spiriva  Health maintenance Flu vaccine today 09/30/2016-Prevnar 08/27/1996-Pneumovax  Plan/Recommendations: - Continue Spiriva.  Evaluating Yupelri nebs  Chilton Greathouse MD [facility] 10/30/2021, 4:04 PM  CC: Avanell Shackleton,  PA-C

## 2021-10-30 NOTE — Patient Instructions (Signed)
I am glad you are doing well with your breathing We will reorder the Spiriva We will also put in an order for nebs and Yupelri via DME  You can use either based on cost to you Follow-up in 6 months

## 2021-11-02 ENCOUNTER — Other Ambulatory Visit: Payer: Self-pay

## 2021-11-02 MED ORDER — YUPELRI 175 MCG/3ML IN SOLN: 175.0000 ug | Freq: Every day | RESPIRATORY_TRACT | 5 refills | Status: DC

## 2021-11-24 ENCOUNTER — Other Ambulatory Visit: Payer: Self-pay

## 2021-11-24 ENCOUNTER — Ambulatory Visit (INDEPENDENT_AMBULATORY_CARE_PROVIDER_SITE_OTHER): Payer: Medicare Other

## 2021-11-24 ENCOUNTER — Encounter: Payer: Self-pay | Admitting: Adult Health

## 2021-11-24 ENCOUNTER — Ambulatory Visit (INDEPENDENT_AMBULATORY_CARE_PROVIDER_SITE_OTHER): Payer: Medicare Other | Admitting: Adult Health

## 2021-11-24 VITALS — BP 140/70 | HR 98 | Temp 97.8°F | Ht 60.0 in | Wt 91.4 lb

## 2021-11-24 DIAGNOSIS — R079 Chest pain, unspecified: Secondary | ICD-10-CM

## 2021-11-24 DIAGNOSIS — N6320 Unspecified lump in the left breast, unspecified quadrant: Secondary | ICD-10-CM | POA: Diagnosis not present

## 2021-11-24 DIAGNOSIS — N644 Mastodynia: Secondary | ICD-10-CM | POA: Diagnosis not present

## 2021-11-24 DIAGNOSIS — E43 Unspecified severe protein-calorie malnutrition: Secondary | ICD-10-CM

## 2021-11-24 DIAGNOSIS — J439 Emphysema, unspecified: Secondary | ICD-10-CM | POA: Diagnosis not present

## 2021-11-24 MED ORDER — ALBUTEROL SULFATE (2.5 MG/3ML) 0.083% IN NEBU: 2.5000 mg | INHALATION_SOLUTION | Freq: Four times a day (QID) | RESPIRATORY_TRACT | 5 refills | Status: AC | PRN

## 2021-11-24 NOTE — Patient Instructions (Addendum)
Stop Spiriva  Continue on [employer] daily .  Albuterol neb every 6hr as needed.  Refer for Left Breast diagnostic Ultrasound -breast mass/pain.  Chest xray today  Follow up with Primary MD  Follow up with Dr. Isaiah Serge in 4 months and As needed   Please contact office for sooner follow up if symptoms do not improve or worsen or seek emergency care

## 2021-11-24 NOTE — Assessment & Plan Note (Signed)
COPD with mild emphysema.  Patient is to stop Spiriva.  Only usually pylori.  We will add an albuterol nebulizer.  Patient has great difficulty using any inhalers. Check chest x-ray today  Plan  Patient Instructions  Stop Spiriva  Continue on [employer] daily .  Albuterol neb every 6hr as needed.  Refer for Left Breast diagnostic Ultrasound -breast mass/pain.  Chest xray today  Follow up with Primary MD  Follow up with Dr. Isaiah Serge in 4 months and As needed   Please contact office for sooner follow up if symptoms do not improve or worsen or seek emergency care

## 2021-11-24 NOTE — Progress Notes (Signed)
He is going to  @Patient  ID: Desiree Hutchinson, female    DOB: 02-Jun-1920, 87 y.o.   MRN: [id number]  Chief Complaint  Patient presents with   Follow-up    Referring provider: Davy Pique  HPI: 87 year old female followed for mild COPD with emphysema  TEST/EVENTS :  Spirometry 01/08/14 FVC 2.08 [119%], FEV1 1.06 [89%], F/F 51 Mild obstruction   Chest x-ray 08/06/15- Mild bibasilar atelectasis, mild vascular congestion. Chest x-ray 09/12/2016- chronic interstitial changes, calcified granuloma in the right midlung.  11/24/2021 Follow up : COPD , Breast pain  Patient presents for a follow-up visit.  Patient has underlying COPD with emphysema.  She is accompanied by her granddaughter.  Patient says overall her breathing is doing okay.  Patient's granddaughter says the reason they brought her in today is that she has been having pain along the left breast and this week they felt a mass and wanted it to to be checked out.  Patient's had no redness, rash or breast discharge.  Patient has not had a mammogram in several years.  No known history of breast cancer. Patient is currently using Yupelri and Spiriva.  We discussed that she should only be used and Yupelri. Patient's granddaughter complains that during the daytime sometimes she needs something extra because she gets some shortness of breath.   No Known Allergies  Immunization History  Administered Date(s) Administered   Fluad Quad(high Dose 65+) 10/26/2019, 10/30/2021   Influenza, High Dose Seasonal PF 09/30/2016   Influenza,inj,Quad PF,6+ Mos 12/18/2013, 10/02/2014, 11/10/2015, 01/16/2018   PFIZER Comirnaty(Gray Top)Covid-19 Tri-Sucrose Vaccine 01/23/2021   PFIZER(Purple Top)SARS-COV-2 Vaccination 04/10/2020, 05/05/2020   Pneumococcal Conjugate-13 09/30/2016   Pneumococcal Polysaccharide-23 08/27/1996   Tdap 01/16/2018    Past Medical History:  Diagnosis Date   Acute lower GI bleeding 01/09/2020   Allergic rhinitis     Anemia    Aortic valve disease    regurge > stenosis   Chest pain, non-cardiac    Chronic diastolic heart failure (HCC)    COPD (chronic obstructive pulmonary disease) (HCC)    Cough    Gastritis    History of atherosclerotic cardiovascular disease    History of small bowel obstruction    Hypertension    Left ventricular hypertrophy    Obesity    Personal history of tobacco use, presenting hazards to health     Tobacco History: Social History   Tobacco Use  Smoking Status Former   Packs/day: 0.10   Years: 30.00   Pack years: 3.00   Types: Cigarettes   Quit date: 01/24/2010   Years since quitting: 11.8  Smokeless Tobacco Never  Tobacco Comments   smoked 1-2 cigarettes/day   Counseling given: Not Answered Tobacco comments: smoked 1-2 cigarettes/day   Outpatient Medications Prior to Visit  Medication Sig Dispense Refill   amLODipine (NORVASC) 2.5 MG tablet Take 1 tablet (2.5 mg total) by mouth daily. 90 tablet 3   cholecalciferol (VITAMIN D) 400 units TABS tablet Take 400 Units by mouth.     CVS SALINE NASAL SPRAY 0.65 % nasal spray PLACE 1 SPRAY INTO BOTH NOSTRILS AS NEEDED FOR CONGESTION. 88 mL 1   Multiple Vitamin (MULTIVITAMIN WITH MINERALS) TABS tablet Take 1 tablet by mouth daily. 90 tablet 0   polyethylene glycol powder (GLYCOLAX/MIRALAX) 17 GM/SCOOP powder Take 17 g by mouth daily. As needed for constipation 500 g 2   Psyllium (METAMUCIL PO) Take 1 Dose by mouth 2 (two) times daily.     revefenacin (  YUPELRI) 175 MCG/3ML nebulizer solution Take 3 mLs (175 mcg total) by nebulization daily. 90 mL 5   Tiotropium Bromide Monohydrate (SPIRIVA RESPIMAT) 2.5 MCG/ACT AERS Inhale 2 puffs by mouth once daily. 4 g 6   No facility-administered medications prior to visit.     Review of Systems:   Constitutional:   No  weight loss, night sweats,  Fevers, chills,  +fatigue, or  lassitude.  HEENT:   No headaches,  Difficulty swallowing,  Tooth/dental problems, or  Sore  throat,                No sneezing, itching, ear ache, nasal congestion, post nasal drip,   CV:  No chest pain,  Orthopnea, PND, swelling in lower extremities, anasarca, dizziness, palpitations, syncope.   GI  No heartburn, indigestion, abdominal pain, nausea, vomiting, diarrhea, change in bowel habits, loss of appetite, bloody stools.   Resp:  No excess mucus, no productive cough,  No non-productive cough,  No coughing up of blood.  No change in color of mucus.  No wheezing.  No chest wall deformity  Skin: no rash or lesions.  GU: no dysuria, change in color of urine, no urgency or frequency.  No flank pain, no hematuria   MS:  No joint pain or swelling.  No decreased range of motion.  No back pain.    Physical Exam  BP 140/70 (BP Location: Left Arm, Patient Position: Sitting, Cuff Size: Normal)   Pulse 98   Temp 97.8 F (36.6 C) (Oral)   Ht 5' (1.524 m)   Wt 91 lb 6.4 oz (41.5 kg)   SpO2 100%   BMI 17.85 kg/m   GEN: A/Ox3; pleasant , NAD, frail, elderly   HEENT:  [place]/AT,  NOSE-clear, THROAT-clear, no lesions, no postnasal drip or exudate noted.   NECK:  Supple w/ fair ROM; no JVD; normal carotid impulses w/o bruits; no thyromegaly or nodules palpated; no lymphadenopathy.    RESP  Clear  P & A; w/o, wheezes/ rales/ or rhonchi. no accessory muscle use, no dullness to percussion  Left breast exam with significant tenderness and palpable density along the mid to proximal breast around 12:00 with 1 finger breath, no nipple discharge. Right breast exam unrevealing.  CARD:  RRR, no m/r/g, no peripheral edema, pulses intact, no cyanosis or clubbing.  GI:   Soft & nt; nml bowel sounds; no organomegaly or masses detected.   Musco: Warm bil, no deformities or joint swelling noted.   Neuro: alert, no focal deficits noted.    Skin: Warm, no lesions or rashes    Lab Results:  CBC    BNP   Imaging: No results found.    No flowsheet data found.  No results found  for: NITRICOXIDE      Assessment & Plan:   COPD with emphysema Gold Stage A COPD with mild emphysema.  Patient is to stop Spiriva.  Only usually pylori.  We will add an albuterol nebulizer.  Patient has great difficulty using any inhalers. Check chest x-ray today  Plan  Patient Instructions  Stop Spiriva  Continue on [employer] daily .  Albuterol neb every 6hr as needed.  Refer for Left Breast diagnostic Ultrasound -breast mass/pain.  Chest xray today  Follow up with Primary MD  Follow up with Dr. Isaiah Serge in 4 months and As needed   Please contact office for sooner follow up if symptoms do not improve or worsen or seek emergency care  Breast pain, left Left breast pain x2 weeks with palpable density.  Patient will need to follow-up with primary care provider.  We will set up for an diagnostic breast ultrasound of the left breast at the breast center.  Protein-calorie malnutrition Patient is encouraged on a high-protein diet   I spent  40  minutes dedicated to the care of this patient on the date of this encounter to include pre-visit review of records, face-to-face time with the patient discussing conditions above, post visit ordering of testing, clinical documentation with the electronic health record, making appropriate referrals as documented, and communicating necessary findings to members of the patients care team.    Rexene Edison, NP 11/24/2021

## 2021-11-24 NOTE — Assessment & Plan Note (Signed)
Left breast pain x2 weeks with palpable density.  Patient will need to follow-up with primary care provider.  We will set up for an diagnostic breast ultrasound of the left breast at the breast center.

## 2021-11-24 NOTE — Assessment & Plan Note (Signed)
Patient is encouraged on a high-protein diet. 

## 2021-11-26 ENCOUNTER — Other Ambulatory Visit: Payer: Self-pay | Admitting: Adult Health

## 2021-11-26 DIAGNOSIS — N644 Mastodynia: Secondary | ICD-10-CM

## 2021-11-26 DIAGNOSIS — N6320 Unspecified lump in the left breast, unspecified quadrant: Secondary | ICD-10-CM

## 2021-11-26 NOTE — Addendum Note (Signed)
Addended by: Jacquiline Doe on: 11/26/2021 11:07 AM   Modules accepted: Orders

## 2021-11-27 NOTE — Progress Notes (Signed)
Called and spoke with patient's grandson (patient gave permission), provided results/recommendations per Rubye Oaks NP.  He verbalized understanding.  He inquired about the breast pain/mass, advised that they would receive a call to schedule the ultrasound to further evaluate that.  He asked that I call his sister after 4:30 at [phone].

## 2022-01-06 ENCOUNTER — Other Ambulatory Visit: Payer: Medicare Other

## 2022-02-08 ENCOUNTER — Ambulatory Visit
Admission: RE | Admit: 2022-02-08 | Discharge: 2022-02-08 | Disposition: A | Discharge status: Home / Self Care | Payer: Medicare Other | Source: Ambulatory Visit | Attending: Adult Health | Admitting: Adult Health

## 2022-02-08 ENCOUNTER — Ambulatory Visit: Payer: Medicare Other

## 2022-02-08 DIAGNOSIS — N644 Mastodynia: Secondary | ICD-10-CM

## 2022-02-08 DIAGNOSIS — N6320 Unspecified lump in the left breast, unspecified quadrant: Secondary | ICD-10-CM

## 2022-02-08 DIAGNOSIS — R922 Inconclusive mammogram: Secondary | ICD-10-CM | POA: Diagnosis not present

## 2022-02-24 NOTE — Progress Notes (Signed)
Acute Office Visit  Subjective:    Patient ID: Desiree Hutchinson, female    DOB: [birth year], 88 y.o.   MRN: [id number]  Chief Complaint  Patient presents with   Consult    Patient can longer take care of herself. They are in need of [facility] nurse to come out and assist otherwise she will have to go into a facility. She has an imaginary friend, cannot dress herself or bathe herself.     HPI Patient is in today for evaluation for assessment to see if a home health evaluation would be able to help with her everyday care; presents with Granddaugher who provides all of the history; patient lives with Granddaughter & is aware that she still owns her own house; she eats 3 meals a day, but small portions. Started talking to an imaginary friend the morning of the funeral of her last living child on 02/07/2022, she is aware that her Daughter passed away; fell off her low bed the day she found out about the funeral and did not have any injuries; incontinence to stool and urine also got worse after funeral.   Past Medical History:  Diagnosis Date   Acute lower GI bleeding 01/09/2020   Allergic rhinitis    Anemia    Aortic valve disease    regurge > stenosis   Chest pain, non-cardiac    Chronic diastolic heart failure ([place])    COPD (chronic obstructive pulmonary disease) ([place])    Cough    Gastritis    History of atherosclerotic cardiovascular disease    History of small bowel obstruction    Hypertension    Left ventricular hypertrophy    Obesity    Personal history of tobacco use, presenting hazards to health     Past Surgical History:  Procedure Laterality Date   adenoasine cardiolite  11/09/2004   APPENDECTOMY     BOWEL RESECTION N/A 08/10/2015   Procedure: SMALL BOWEL OBSTRUCTION;  Surgeon: Excell Seltzer, MD;  Location: [facility];  Service: General;  Laterality: N/A;   ESOPHAGOGASTRODUODENOSCOPY  11/09/1999   HERNIA REPAIR     IR GENERIC HISTORICAL  09/12/2016   IR FLUORO GUIDE CV LINE  RIGHT 09/12/2016 Jacqulynn Cadet, MD [facility]   IR GENERIC HISTORICAL  09/12/2016   IR US GUIDE VASC ACCESS RIGHT 09/12/2016 Jacqulynn Cadet, MD [facility]   IR GENERIC HISTORICAL  09/12/2016   IR ANGIOGRAM SELECTIVE EACH ADDITIONAL VESSEL 09/12/2016 Jacqulynn Cadet, MD [facility]   IR GENERIC HISTORICAL  09/12/2016   IR ANGIOGRAM VISCERAL SELECTIVE 09/12/2016 Jacqulynn Cadet, MD [facility]   IR GENERIC HISTORICAL  09/12/2016   IR ANGIOGRAM SELECTIVE EACH ADDITIONAL VESSEL 09/12/2016 Jacqulynn Cadet, MD [facility]   IR GENERIC HISTORICAL  09/12/2016   IR ANGIOGRAM SELECTIVE EACH ADDITIONAL VESSEL 09/12/2016 Jacqulynn Cadet, MD [facility]   IR GENERIC HISTORICAL  09/12/2016   IR US GUIDE VASC ACCESS RIGHT 09/12/2016 Jacqulynn Cadet, MD [facility]   IR GENERIC HISTORICAL  09/12/2016   IR ANGIOGRAM SELECTIVE EACH ADDITIONAL VESSEL 09/12/2016 Jacqulynn Cadet, MD [facility]   IR GENERIC HISTORICAL  09/12/2016   IR EMBO ART  VEN HEMORR LYMPH EXTRAV  INC GUIDE ROADMAPPING 09/12/2016 Jacqulynn Cadet, MD [facility]   IR GENERIC HISTORICAL  09/12/2016   IR ANGIOGRAM SELECTIVE EACH ADDITIONAL VESSEL 09/12/2016 Jacqulynn Cadet, MD [facility]   LAPAROTOMY  08/10/2015   Procedure: EXPLORATORY LAPAROTOMY;  Surgeon: Excell Seltzer, MD;  Location: [facility];  Service: General;;  LYSIS OF ADHESION  08/10/2015   Procedure: LYSIS OF ADHESION;  Surgeon: Glenna Fellows, MD;  Location: [facility];  Service: General;;   small bowel obstruction  06/1996   transthoracic cardiolite   11/03/2006    History reviewed. No pertinent family history.  Social History   Socioeconomic History   Marital status: Widowed    Spouse name: Not on file   Number of children: Not on file   Years of education: Not on file   Highest education level: Not on file  Occupational History   Not on file  Tobacco Use   Smoking status: Former    Packs/day: 0.10    Years: 30.00    Pack years: 3.00    Types:  Cigarettes    Quit date: 01/24/2010    Years since quitting: 12.0   Smokeless tobacco: Never   Tobacco comments:    smoked 1-2 cigarettes/day  Vaping Use   Vaping Use: Never used  Substance and Sexual Activity   Alcohol use: No    Alcohol/week: 0.0 standard drinks   Drug use: No   Sexual activity: Not Currently  Other Topics Concern   Not on file  Social History Narrative   Not on file   Social Determinants of Health   Financial Resource Strain: Not on file  Food Insecurity: Not on file  Transportation Needs: Not on file  Physical Activity: Not on file  Stress: Not on file  Social Connections: Not on file  Intimate Partner Violence: Not on file    Outpatient Medications Prior to Visit  Medication Sig Dispense Refill   albuterol (PROVENTIL) (2.5 MG/3ML) 0.083% nebulizer solution Take 3 mLs (2.5 mg total) by nebulization every 6 (six) hours as needed for wheezing or shortness of breath. 75 mL 5   amLODipine (NORVASC) 2.5 MG tablet Take 1 tablet (2.5 mg total) by mouth daily. 90 tablet 3   cholecalciferol (VITAMIN D) 400 units TABS tablet Take 400 Units by mouth.     Multiple Vitamin (MULTIVITAMIN WITH MINERALS) TABS tablet Take 1 tablet by mouth daily. 90 tablet 0   polyethylene glycol powder (GLYCOLAX/MIRALAX) 17 GM/SCOOP powder Take 17 g by mouth daily. As needed for constipation 500 g 2   revefenacin (YUPELRI) 175 MCG/3ML nebulizer solution Take 3 mLs (175 mcg total) by nebulization daily. 90 mL 5   CVS SALINE NASAL SPRAY 0.65 % nasal spray PLACE 1 SPRAY INTO BOTH NOSTRILS AS NEEDED FOR CONGESTION. 88 mL 1   Psyllium (METAMUCIL PO) Take 1 Dose by mouth 2 (two) times daily.     Tiotropium Bromide Monohydrate (SPIRIVA RESPIMAT) 2.5 MCG/ACT AERS Inhale 2 puffs by mouth once daily. 4 g 6   No facility-administered medications prior to visit.    No Known Allergies  Review of Systems  Constitutional:  Negative for activity change and chills.  HENT:  Negative for congestion  and voice change.   Eyes:  Negative for pain and redness.  Respiratory:  Negative for cough and wheezing.   Cardiovascular:  Negative for chest pain.  Gastrointestinal:  Negative for constipation, diarrhea, nausea and vomiting.  Endocrine: Negative for polyuria.  Genitourinary:  Positive for enuresis and frequency.  Skin:  Negative for color change and rash.  Allergic/Immunologic: Negative for immunocompromised state.  Neurological:  Positive for weakness. Negative for dizziness.  Psychiatric/Behavioral:  Positive for agitation, hallucinations and sleep disturbance.       Objective:    Physical Exam Vitals and nursing note reviewed.  Constitutional:  General: She is not in acute distress.    Appearance: Normal appearance. She is not ill-appearing.  HENT:     Head: Normocephalic and atraumatic.     Right Ear: External ear normal.     Left Ear: External ear normal.     Nose: No congestion.  Eyes:     Extraocular Movements: Extraocular movements intact.     Conjunctiva/sclera: Conjunctivae normal.     Pupils: Pupils are equal, round, and reactive to light.  Cardiovascular:     Rate and Rhythm: Normal rate and regular rhythm.     Pulses: Normal pulses.     Heart sounds: Normal heart sounds.  Pulmonary:     Effort: Pulmonary effort is normal.     Breath sounds: Normal breath sounds. No wheezing.  Abdominal:     General: Bowel sounds are normal.     Palpations: Abdomen is soft.  Musculoskeletal:     Cervical back: Normal range of motion and neck supple.     Right lower leg: No edema.     Left lower leg: No edema.  Skin:    General: Skin is warm and dry.     Findings: No bruising.  Neurological:     General: No focal deficit present.     Mental Status: She is alert and oriented to person, place, and time.  Psychiatric:        Mood and Affect: Mood normal.        Behavior: Behavior normal.        Thought Content: Thought content normal.    BP 116/72    Pulse 68     Ht 5' (1.524 m)    Wt 90 lb (40.8 kg)    BMI 17.58 kg/m   Wt Readings from Last 3 Encounters:  02/25/22 90 lb (40.8 kg)  11/24/21 91 lb 6.4 oz (41.5 kg)  10/30/21 111 lb (50.3 kg)    Health Maintenance Due  Topic Date Due   Zoster Vaccines- Shingrix (1 of 2) Never done   COVID-19 Vaccine (4 - Booster for Pfizer series) 03/20/2021    There are no preventive care reminders to display for this patient.   Lab Results  Component Value Date   TSH 2.855 09/12/2016   Lab Results  Component Value Date   WBC 7.5 01/17/2020   HGB 9.4 (L) 01/17/2020   HCT 28.8 (L) 01/17/2020   MCV 101.2 (H) 01/17/2020   PLT 323.0 01/17/2020   Lab Results  Component Value Date   NA 142 01/11/2020   K 4.1 01/11/2020   CO2 20 (L) 01/11/2020   GLUCOSE 87 01/11/2020   BUN 23 01/11/2020   CREATININE 1.18 (H) 01/11/2020   BILITOT 0.6 01/08/2020   ALKPHOS 75 01/08/2020   AST 21 01/08/2020   ALT 9 01/08/2020   PROT 6.1 (L) 01/08/2020   ALBUMIN 3.1 (L) 01/08/2020   CALCIUM 9.0 01/11/2020   ANIONGAP 9 01/11/2020   GFR 70.24 06/20/2017   Lab Results  Component Value Date   CHOL 198 01/10/2015   Lab Results  Component Value Date   HDL 88.60 01/10/2015   Lab Results  Component Value Date   LDLCALC 99 01/10/2015   Lab Results  Component Value Date   TRIG 68 08/18/2015   Lab Results  Component Value Date   CHOLHDL 2 01/10/2015   No results found for: HGBA1C     Assessment & Plan:   Problem List Items Addressed This Visit  Cardiovascular and Mediastinum   Chronic diastolic CHF (congestive heart failure) ([place])   Relevant Orders   Ambulatory referral to [place]     Respiratory   COPD with emphysema Gold Stage A   Relevant Orders   Ambulatory referral to [place] and [place]   Hearing loss   Senile dementia ([place]) - Primary   Relevant Orders   Ambulatory referral to Home Health   Leg weakness, bilateral   Relevant Orders   Ambulatory referral to  [place]     Other   Protein-calorie malnutrition [facility])   Relevant Orders   Ambulatory referral to [place]   Urinary incontinence   Relevant Orders   Ambulatory referral to [place]   Body mass index less than 19, adult   Relevant Orders   Ambulatory referral to [place]   Uses walker   Relevant Orders   Ambulatory referral to [place]   Encopresis   Relevant Orders   Ambulatory referral to Home Health   Diurnal enuresis   Relevant Orders   Ambulatory referral to [place]     No orders of the defined types were placed in this encounter.  Return in 6 months for follow up or as needed.   Irene Pap, PA-C

## 2022-02-25 ENCOUNTER — Ambulatory Visit (INDEPENDENT_AMBULATORY_CARE_PROVIDER_SITE_OTHER): Payer: Medicare Other | Admitting: Physician Assistant

## 2022-02-25 ENCOUNTER — Other Ambulatory Visit: Payer: Self-pay

## 2022-02-25 ENCOUNTER — Encounter: Payer: Self-pay | Admitting: Physician Assistant

## 2022-02-25 VITALS — BP 116/72 | HR 68 | Ht 60.0 in | Wt 90.0 lb

## 2022-02-25 DIAGNOSIS — H9193 Unspecified hearing loss, bilateral: Secondary | ICD-10-CM

## 2022-02-25 DIAGNOSIS — F0390 Unspecified dementia without behavioral disturbance: Secondary | ICD-10-CM | POA: Diagnosis not present

## 2022-02-25 DIAGNOSIS — R29898 Other symptoms and signs involving the musculoskeletal system: Secondary | ICD-10-CM

## 2022-02-25 DIAGNOSIS — E43 Unspecified severe protein-calorie malnutrition: Secondary | ICD-10-CM | POA: Diagnosis not present

## 2022-02-25 DIAGNOSIS — I5032 Chronic diastolic (congestive) heart failure: Secondary | ICD-10-CM

## 2022-02-25 DIAGNOSIS — Z681 Body mass index (BMI) 19 or less, adult: Secondary | ICD-10-CM

## 2022-02-25 DIAGNOSIS — R32 Unspecified urinary incontinence: Secondary | ICD-10-CM

## 2022-02-25 DIAGNOSIS — J439 Emphysema, unspecified: Secondary | ICD-10-CM

## 2022-02-25 DIAGNOSIS — Z9989 Dependence on other enabling machines and devices: Secondary | ICD-10-CM | POA: Diagnosis not present

## 2022-02-25 DIAGNOSIS — R159 Full incontinence of feces: Secondary | ICD-10-CM | POA: Diagnosis not present

## 2022-02-25 DIAGNOSIS — N39498 Other specified urinary incontinence: Secondary | ICD-10-CM | POA: Diagnosis not present

## 2022-03-01 ENCOUNTER — Other Ambulatory Visit: Payer: Self-pay | Admitting: Physician Assistant

## 2022-03-01 DIAGNOSIS — R32 Unspecified urinary incontinence: Secondary | ICD-10-CM

## 2022-03-01 DIAGNOSIS — F0390 Unspecified dementia without behavioral disturbance: Secondary | ICD-10-CM

## 2022-03-01 DIAGNOSIS — R29898 Other symptoms and signs involving the musculoskeletal system: Secondary | ICD-10-CM

## 2022-03-01 DIAGNOSIS — E43 Unspecified severe protein-calorie malnutrition: Secondary | ICD-10-CM

## 2022-03-01 DIAGNOSIS — N39498 Other specified urinary incontinence: Secondary | ICD-10-CM

## 2022-03-01 DIAGNOSIS — R159 Full incontinence of feces: Secondary | ICD-10-CM

## 2022-03-01 DIAGNOSIS — I5032 Chronic diastolic (congestive) heart failure: Secondary | ICD-10-CM

## 2022-03-01 DIAGNOSIS — H9193 Unspecified hearing loss, bilateral: Secondary | ICD-10-CM

## 2022-03-01 DIAGNOSIS — Z681 Body mass index (BMI) 19 or less, adult: Secondary | ICD-10-CM

## 2022-03-01 DIAGNOSIS — J439 Emphysema, unspecified: Secondary | ICD-10-CM

## 2022-03-01 DIAGNOSIS — Z9989 Dependence on other enabling machines and devices: Secondary | ICD-10-CM

## 2022-03-03 DIAGNOSIS — J449 Chronic obstructive pulmonary disease, unspecified: Secondary | ICD-10-CM | POA: Diagnosis not present

## 2022-03-03 DIAGNOSIS — I5032 Chronic diastolic (congestive) heart failure: Secondary | ICD-10-CM | POA: Diagnosis not present

## 2022-03-03 DIAGNOSIS — J439 Emphysema, unspecified: Secondary | ICD-10-CM | POA: Diagnosis not present

## 2022-03-03 DIAGNOSIS — R159 Full incontinence of feces: Secondary | ICD-10-CM | POA: Diagnosis not present

## 2022-03-03 DIAGNOSIS — I350 Nonrheumatic aortic (valve) stenosis: Secondary | ICD-10-CM | POA: Diagnosis not present

## 2022-03-03 DIAGNOSIS — E43 Unspecified severe protein-calorie malnutrition: Secondary | ICD-10-CM | POA: Diagnosis not present

## 2022-03-03 DIAGNOSIS — R29898 Other symptoms and signs involving the musculoskeletal system: Secondary | ICD-10-CM | POA: Diagnosis not present

## 2022-03-03 DIAGNOSIS — N39498 Other specified urinary incontinence: Secondary | ICD-10-CM | POA: Diagnosis not present

## 2022-03-03 DIAGNOSIS — F03918 Unspecified dementia, unspecified severity, with other behavioral disturbance: Secondary | ICD-10-CM | POA: Diagnosis not present

## 2022-03-03 DIAGNOSIS — D649 Anemia, unspecified: Secondary | ICD-10-CM | POA: Diagnosis not present

## 2022-03-03 DIAGNOSIS — Z681 Body mass index (BMI) 19 or less, adult: Secondary | ICD-10-CM | POA: Diagnosis not present

## 2022-03-03 DIAGNOSIS — I10 Essential (primary) hypertension: Secondary | ICD-10-CM | POA: Diagnosis not present

## 2022-03-04 ENCOUNTER — Other Ambulatory Visit: Payer: Self-pay | Admitting: Physician Assistant

## 2022-03-04 ENCOUNTER — Telehealth: Payer: Self-pay

## 2022-03-04 DIAGNOSIS — N39498 Other specified urinary incontinence: Secondary | ICD-10-CM

## 2022-03-04 DIAGNOSIS — E43 Unspecified severe protein-calorie malnutrition: Secondary | ICD-10-CM | POA: Diagnosis not present

## 2022-03-04 DIAGNOSIS — D649 Anemia, unspecified: Secondary | ICD-10-CM | POA: Diagnosis not present

## 2022-03-04 DIAGNOSIS — R29898 Other symptoms and signs involving the musculoskeletal system: Secondary | ICD-10-CM

## 2022-03-04 DIAGNOSIS — J439 Emphysema, unspecified: Secondary | ICD-10-CM | POA: Diagnosis not present

## 2022-03-04 DIAGNOSIS — R32 Unspecified urinary incontinence: Secondary | ICD-10-CM

## 2022-03-04 DIAGNOSIS — I5032 Chronic diastolic (congestive) heart failure: Secondary | ICD-10-CM | POA: Diagnosis not present

## 2022-03-04 DIAGNOSIS — Z681 Body mass index (BMI) 19 or less, adult: Secondary | ICD-10-CM

## 2022-03-04 DIAGNOSIS — F03918 Unspecified dementia, unspecified severity, with other behavioral disturbance: Secondary | ICD-10-CM | POA: Diagnosis not present

## 2022-03-04 DIAGNOSIS — R159 Full incontinence of feces: Secondary | ICD-10-CM

## 2022-03-04 DIAGNOSIS — F0390 Unspecified dementia without behavioral disturbance: Secondary | ICD-10-CM

## 2022-03-04 DIAGNOSIS — H9193 Unspecified hearing loss, bilateral: Secondary | ICD-10-CM

## 2022-03-04 NOTE — Telephone Encounter (Signed)
Ok, thank you

## 2022-03-04 NOTE — Telephone Encounter (Signed)
Spoke to PT; referral to Palliative care made; attempted to call Granddaughter, Estevan Ryder, but no answer.

## 2022-03-04 NOTE — Telephone Encounter (Signed)
Cindee Lame the PT with [facility] called stating he saw Mrs. Thatch today and thought that palliative care maybe more appropriate for the pt. The only family member that agreed was Estevan Ryder Granddaughter. He thought we could call her at [phone] and order [facility] care to come to evaluate. He also wanted to know if he could get a verbal order for a Child psychotherapist from [place] to come out as well. He can be reached at [phone].  ?

## 2022-03-08 ENCOUNTER — Telehealth: Payer: Self-pay | Admitting: Physician Assistant

## 2022-03-08 DIAGNOSIS — E43 Unspecified severe protein-calorie malnutrition: Secondary | ICD-10-CM | POA: Diagnosis not present

## 2022-03-08 DIAGNOSIS — I5032 Chronic diastolic (congestive) heart failure: Secondary | ICD-10-CM | POA: Diagnosis not present

## 2022-03-08 DIAGNOSIS — R29898 Other symptoms and signs involving the musculoskeletal system: Secondary | ICD-10-CM | POA: Diagnosis not present

## 2022-03-08 DIAGNOSIS — J439 Emphysema, unspecified: Secondary | ICD-10-CM | POA: Diagnosis not present

## 2022-03-08 DIAGNOSIS — D649 Anemia, unspecified: Secondary | ICD-10-CM | POA: Diagnosis not present

## 2022-03-08 DIAGNOSIS — F03918 Unspecified dementia, unspecified severity, with other behavioral disturbance: Secondary | ICD-10-CM | POA: Diagnosis not present

## 2022-03-08 NOTE — Telephone Encounter (Signed)
That is fine. Do they want a verbal order or should I order in her chart?

## 2022-03-08 NOTE — Telephone Encounter (Signed)
Will OT with [facility]  [phone] ? ?Requesting orders for occ therapy for 2 x week for 2 weeks ?

## 2022-03-09 ENCOUNTER — Telehealth: Payer: Self-pay

## 2022-03-09 NOTE — Telephone Encounter (Signed)
Attempted to contact patient's granddaughter Desiree Hutchinson to schedule a Palliative Care consult appointment. No answer and unable to leave a message voicemail is full.  ?

## 2022-03-09 NOTE — Telephone Encounter (Signed)
Informed Will OT approved by Desiree Hutchinson per his request ?

## 2022-03-09 NOTE — Telephone Encounter (Signed)
Great.  Thanks

## 2022-03-10 DIAGNOSIS — D649 Anemia, unspecified: Secondary | ICD-10-CM | POA: Diagnosis not present

## 2022-03-10 DIAGNOSIS — I5032 Chronic diastolic (congestive) heart failure: Secondary | ICD-10-CM | POA: Diagnosis not present

## 2022-03-10 DIAGNOSIS — J439 Emphysema, unspecified: Secondary | ICD-10-CM | POA: Diagnosis not present

## 2022-03-10 DIAGNOSIS — E43 Unspecified severe protein-calorie malnutrition: Secondary | ICD-10-CM | POA: Diagnosis not present

## 2022-03-10 DIAGNOSIS — R29898 Other symptoms and signs involving the musculoskeletal system: Secondary | ICD-10-CM | POA: Diagnosis not present

## 2022-03-10 DIAGNOSIS — F03918 Unspecified dementia, unspecified severity, with other behavioral disturbance: Secondary | ICD-10-CM | POA: Diagnosis not present

## 2022-03-11 ENCOUNTER — Telehealth: Payer: Self-pay

## 2022-03-11 DIAGNOSIS — F03918 Unspecified dementia, unspecified severity, with other behavioral disturbance: Secondary | ICD-10-CM | POA: Diagnosis not present

## 2022-03-11 DIAGNOSIS — E43 Unspecified severe protein-calorie malnutrition: Secondary | ICD-10-CM | POA: Diagnosis not present

## 2022-03-11 DIAGNOSIS — R29898 Other symptoms and signs involving the musculoskeletal system: Secondary | ICD-10-CM | POA: Diagnosis not present

## 2022-03-11 DIAGNOSIS — I5032 Chronic diastolic (congestive) heart failure: Secondary | ICD-10-CM | POA: Diagnosis not present

## 2022-03-11 DIAGNOSIS — J439 Emphysema, unspecified: Secondary | ICD-10-CM | POA: Diagnosis not present

## 2022-03-11 DIAGNOSIS — D649 Anemia, unspecified: Secondary | ICD-10-CM | POA: Diagnosis not present

## 2022-03-11 NOTE — Telephone Encounter (Signed)
Attempted to contact patient's granddaughter [name] to schedule a Palliative Care consult appointment. Left a message to return call with the female that answered the phone.  ? ?

## 2022-03-16 ENCOUNTER — Telehealth: Payer: Self-pay

## 2022-03-16 DIAGNOSIS — F03918 Unspecified dementia, unspecified severity, with other behavioral disturbance: Secondary | ICD-10-CM | POA: Diagnosis not present

## 2022-03-16 DIAGNOSIS — D649 Anemia, unspecified: Secondary | ICD-10-CM | POA: Diagnosis not present

## 2022-03-16 DIAGNOSIS — R29898 Other symptoms and signs involving the musculoskeletal system: Secondary | ICD-10-CM | POA: Diagnosis not present

## 2022-03-16 DIAGNOSIS — I5032 Chronic diastolic (congestive) heart failure: Secondary | ICD-10-CM | POA: Diagnosis not present

## 2022-03-16 DIAGNOSIS — J439 Emphysema, unspecified: Secondary | ICD-10-CM | POA: Diagnosis not present

## 2022-03-16 DIAGNOSIS — E43 Unspecified severe protein-calorie malnutrition: Secondary | ICD-10-CM | POA: Diagnosis not present

## 2022-03-16 NOTE — Telephone Encounter (Signed)
Attempted to contact patient's granddaughter Desiree Hutchinson to schedule a Palliative Care consult appointment. No answer left a message to return call.  ? ?

## 2022-03-17 DIAGNOSIS — D649 Anemia, unspecified: Secondary | ICD-10-CM | POA: Diagnosis not present

## 2022-03-17 DIAGNOSIS — I5032 Chronic diastolic (congestive) heart failure: Secondary | ICD-10-CM | POA: Diagnosis not present

## 2022-03-17 DIAGNOSIS — E43 Unspecified severe protein-calorie malnutrition: Secondary | ICD-10-CM | POA: Diagnosis not present

## 2022-03-17 DIAGNOSIS — J439 Emphysema, unspecified: Secondary | ICD-10-CM | POA: Diagnosis not present

## 2022-03-17 DIAGNOSIS — R29898 Other symptoms and signs involving the musculoskeletal system: Secondary | ICD-10-CM | POA: Diagnosis not present

## 2022-03-17 DIAGNOSIS — F03918 Unspecified dementia, unspecified severity, with other behavioral disturbance: Secondary | ICD-10-CM | POA: Diagnosis not present

## 2022-03-19 DIAGNOSIS — I5032 Chronic diastolic (congestive) heart failure: Secondary | ICD-10-CM | POA: Diagnosis not present

## 2022-03-19 DIAGNOSIS — J439 Emphysema, unspecified: Secondary | ICD-10-CM | POA: Diagnosis not present

## 2022-03-19 DIAGNOSIS — D649 Anemia, unspecified: Secondary | ICD-10-CM | POA: Diagnosis not present

## 2022-03-19 DIAGNOSIS — E43 Unspecified severe protein-calorie malnutrition: Secondary | ICD-10-CM | POA: Diagnosis not present

## 2022-03-19 DIAGNOSIS — F03918 Unspecified dementia, unspecified severity, with other behavioral disturbance: Secondary | ICD-10-CM | POA: Diagnosis not present

## 2022-03-19 DIAGNOSIS — R29898 Other symptoms and signs involving the musculoskeletal system: Secondary | ICD-10-CM | POA: Diagnosis not present

## 2022-03-23 ENCOUNTER — Telehealth: Payer: Self-pay

## 2022-03-23 DIAGNOSIS — E43 Unspecified severe protein-calorie malnutrition: Secondary | ICD-10-CM | POA: Diagnosis not present

## 2022-03-23 DIAGNOSIS — J439 Emphysema, unspecified: Secondary | ICD-10-CM | POA: Diagnosis not present

## 2022-03-23 DIAGNOSIS — R29898 Other symptoms and signs involving the musculoskeletal system: Secondary | ICD-10-CM | POA: Diagnosis not present

## 2022-03-23 DIAGNOSIS — I5032 Chronic diastolic (congestive) heart failure: Secondary | ICD-10-CM | POA: Diagnosis not present

## 2022-03-23 DIAGNOSIS — F03918 Unspecified dementia, unspecified severity, with other behavioral disturbance: Secondary | ICD-10-CM | POA: Diagnosis not present

## 2022-03-23 DIAGNOSIS — D649 Anemia, unspecified: Secondary | ICD-10-CM | POA: Diagnosis not present

## 2022-03-23 NOTE — Telephone Encounter (Signed)
Spoke with patient's granddaughter [name] regarding palliative Care services. She declined services at this time. She is wanting home health aide for assistance in ADL's.  ?

## 2022-04-01 DIAGNOSIS — R29898 Other symptoms and signs involving the musculoskeletal system: Secondary | ICD-10-CM | POA: Diagnosis not present

## 2022-04-01 DIAGNOSIS — D649 Anemia, unspecified: Secondary | ICD-10-CM | POA: Diagnosis not present

## 2022-04-01 DIAGNOSIS — E43 Unspecified severe protein-calorie malnutrition: Secondary | ICD-10-CM | POA: Diagnosis not present

## 2022-04-01 DIAGNOSIS — I5032 Chronic diastolic (congestive) heart failure: Secondary | ICD-10-CM | POA: Diagnosis not present

## 2022-04-01 DIAGNOSIS — F03918 Unspecified dementia, unspecified severity, with other behavioral disturbance: Secondary | ICD-10-CM | POA: Diagnosis not present

## 2022-04-01 DIAGNOSIS — J439 Emphysema, unspecified: Secondary | ICD-10-CM | POA: Diagnosis not present

## 2022-04-02 DIAGNOSIS — R159 Full incontinence of feces: Secondary | ICD-10-CM | POA: Diagnosis not present

## 2022-04-02 DIAGNOSIS — E43 Unspecified severe protein-calorie malnutrition: Secondary | ICD-10-CM | POA: Diagnosis not present

## 2022-04-02 DIAGNOSIS — R29898 Other symptoms and signs involving the musculoskeletal system: Secondary | ICD-10-CM | POA: Diagnosis not present

## 2022-04-02 DIAGNOSIS — Z681 Body mass index (BMI) 19 or less, adult: Secondary | ICD-10-CM | POA: Diagnosis not present

## 2022-04-02 DIAGNOSIS — D649 Anemia, unspecified: Secondary | ICD-10-CM | POA: Diagnosis not present

## 2022-04-02 DIAGNOSIS — I5032 Chronic diastolic (congestive) heart failure: Secondary | ICD-10-CM | POA: Diagnosis not present

## 2022-04-02 DIAGNOSIS — J449 Chronic obstructive pulmonary disease, unspecified: Secondary | ICD-10-CM | POA: Diagnosis not present

## 2022-04-02 DIAGNOSIS — I10 Essential (primary) hypertension: Secondary | ICD-10-CM | POA: Diagnosis not present

## 2022-04-02 DIAGNOSIS — F03918 Unspecified dementia, unspecified severity, with other behavioral disturbance: Secondary | ICD-10-CM | POA: Diagnosis not present

## 2022-04-02 DIAGNOSIS — N39498 Other specified urinary incontinence: Secondary | ICD-10-CM | POA: Diagnosis not present

## 2022-04-02 DIAGNOSIS — J439 Emphysema, unspecified: Secondary | ICD-10-CM | POA: Diagnosis not present

## 2022-04-02 DIAGNOSIS — I350 Nonrheumatic aortic (valve) stenosis: Secondary | ICD-10-CM | POA: Diagnosis not present

## 2022-04-08 DIAGNOSIS — I5032 Chronic diastolic (congestive) heart failure: Secondary | ICD-10-CM | POA: Diagnosis not present

## 2022-04-08 DIAGNOSIS — J439 Emphysema, unspecified: Secondary | ICD-10-CM | POA: Diagnosis not present

## 2022-04-08 DIAGNOSIS — D649 Anemia, unspecified: Secondary | ICD-10-CM | POA: Diagnosis not present

## 2022-04-08 DIAGNOSIS — R29898 Other symptoms and signs involving the musculoskeletal system: Secondary | ICD-10-CM | POA: Diagnosis not present

## 2022-04-08 DIAGNOSIS — F03918 Unspecified dementia, unspecified severity, with other behavioral disturbance: Secondary | ICD-10-CM | POA: Diagnosis not present

## 2022-04-08 DIAGNOSIS — E43 Unspecified severe protein-calorie malnutrition: Secondary | ICD-10-CM | POA: Diagnosis not present

## 2022-04-12 DIAGNOSIS — I5032 Chronic diastolic (congestive) heart failure: Secondary | ICD-10-CM | POA: Diagnosis not present

## 2022-04-12 DIAGNOSIS — F03918 Unspecified dementia, unspecified severity, with other behavioral disturbance: Secondary | ICD-10-CM | POA: Diagnosis not present

## 2022-04-12 DIAGNOSIS — J439 Emphysema, unspecified: Secondary | ICD-10-CM | POA: Diagnosis not present

## 2022-04-12 DIAGNOSIS — R29898 Other symptoms and signs involving the musculoskeletal system: Secondary | ICD-10-CM | POA: Diagnosis not present

## 2022-04-12 DIAGNOSIS — D649 Anemia, unspecified: Secondary | ICD-10-CM | POA: Diagnosis not present

## 2022-04-12 DIAGNOSIS — E43 Unspecified severe protein-calorie malnutrition: Secondary | ICD-10-CM | POA: Diagnosis not present

## 2022-04-19 DIAGNOSIS — R29898 Other symptoms and signs involving the musculoskeletal system: Secondary | ICD-10-CM | POA: Diagnosis not present

## 2022-04-19 DIAGNOSIS — J439 Emphysema, unspecified: Secondary | ICD-10-CM | POA: Diagnosis not present

## 2022-04-19 DIAGNOSIS — I5032 Chronic diastolic (congestive) heart failure: Secondary | ICD-10-CM | POA: Diagnosis not present

## 2022-04-19 DIAGNOSIS — F03918 Unspecified dementia, unspecified severity, with other behavioral disturbance: Secondary | ICD-10-CM | POA: Diagnosis not present

## 2022-04-19 DIAGNOSIS — D649 Anemia, unspecified: Secondary | ICD-10-CM | POA: Diagnosis not present

## 2022-04-19 DIAGNOSIS — E43 Unspecified severe protein-calorie malnutrition: Secondary | ICD-10-CM | POA: Diagnosis not present

## 2022-04-29 ENCOUNTER — Other Ambulatory Visit: Payer: Self-pay | Admitting: Pulmonary Disease

## 2022-05-20 ENCOUNTER — Other Ambulatory Visit: Payer: Self-pay | Admitting: Cardiovascular Disease

## 2022-06-13 NOTE — Progress Notes (Unsigned)
Cardiology Office Note:    Date:  06/15/2022   ID:  Desiree Hutchinson, DOB 11-24-1920, MRN [id number]  PCP:  Lexine Baton   [facility] Providers Cardiologist:  Tonny Bollman, MD Cardiology APP:  Kennon Rounds     Referring MD: Jake Shark, [place]   Chief Complaint: follow-up aortic stenosis  History of Present Illness:    Desiree Hutchinson is a 88 y.o. female with a hx of   Aortic stenosis Echocardiogram 01/2019: mod to severe AS (mean 35 mmHg, DI 0.31, Vmax 393 cm/s)  (HFpEF) heart failure with preserved ejection fraction Hypertension COPD History of SBO History of GI bleed Diverticulosis  Has maintained consistent follow-up for many years for chronic dyspnea, no evidence of ischemia on remote nuclear stress test.   She was last seen in our office on 04/08/2021 by Tereso Newcomer, PA.  She did not have symptoms of severe aortic stenosis.  She was asked if she would ever consider TAVR and reported she was not interested in pursuing any procedures.  She was advised to contact our office if she changed her mind.  A 1 year follow-up was recommended.  Today, she is here with her granddaughter. She follows some of my commands but is otherwise nonverbal.  Her granddaughter reports that her dementia worsened when she lost her fourth child recently. Prior to that, she was preparing her own meals and walked around more. Now she sleeps most of the time. Is still able to feed herself, has good appetite. Granddaughter reports that she has a great deal of generalized pain, was in pain on our exam table during EKG. She does not verbalize any specific complaints. Granddaughter reports she has not reported any chest pain, shortness of breath, lower extremity edema, fatigue, palpitations, melena, hematuria, hemoptysis, diaphoresis, presyncope, syncope, orthopnea, and PND.   Past Medical History:  Diagnosis Date   Acute lower GI bleeding 01/09/2020   Allergic rhinitis     Anemia    Aortic valve disease    regurge > stenosis   Chest pain, non-cardiac    Chronic diastolic heart failure (HCC)    COPD (chronic obstructive pulmonary disease) (HCC)    Cough    Gastritis    History of atherosclerotic cardiovascular disease    History of small bowel obstruction    Hypertension    Left ventricular hypertrophy    Obesity    Personal history of tobacco use, presenting hazards to health     Past Surgical History:  Procedure Laterality Date   adenoasine cardiolite  11/09/2004   APPENDECTOMY     BOWEL RESECTION N/A 08/10/2015   Procedure: SMALL BOWEL OBSTRUCTION;  Surgeon: Glenna Fellows, MD;  Location: [facility];  Service: General;  Laterality: N/A;   ESOPHAGOGASTRODUODENOSCOPY  11/09/1999   HERNIA REPAIR     IR GENERIC HISTORICAL  09/12/2016   IR FLUORO GUIDE CV LINE RIGHT 09/12/2016 Malachy Moan, MD [facility]   IR GENERIC HISTORICAL  09/12/2016   IR US GUIDE VASC ACCESS RIGHT 09/12/2016 Malachy Moan, MD [facility]   IR GENERIC HISTORICAL  09/12/2016   IR ANGIOGRAM SELECTIVE EACH ADDITIONAL VESSEL 09/12/2016 Malachy Moan, MD [facility]   IR GENERIC HISTORICAL  09/12/2016   IR ANGIOGRAM VISCERAL SELECTIVE 09/12/2016 Malachy Moan, MD [facility]   IR GENERIC HISTORICAL  09/12/2016   IR ANGIOGRAM SELECTIVE EACH ADDITIONAL VESSEL 09/12/2016 Malachy Moan, MD [facility]   IR GENERIC HISTORICAL  09/12/2016   IR ANGIOGRAM SELECTIVE  EACH ADDITIONAL VESSEL 09/12/2016 Malachy Moan, MD [facility]   IR GENERIC HISTORICAL  09/12/2016   IR US GUIDE VASC ACCESS RIGHT 09/12/2016 Malachy Moan, MD [facility]   IR GENERIC HISTORICAL  09/12/2016   IR ANGIOGRAM SELECTIVE EACH ADDITIONAL VESSEL 09/12/2016 Malachy Moan, MD [facility]   IR GENERIC HISTORICAL  09/12/2016   IR EMBO ART  VEN HEMORR LYMPH EXTRAV  INC GUIDE ROADMAPPING 09/12/2016 Malachy Moan, MD [facility]   IR GENERIC HISTORICAL  09/12/2016   IR ANGIOGRAM SELECTIVE EACH  ADDITIONAL VESSEL 09/12/2016 Malachy Moan, MD [facility]   LAPAROTOMY  08/10/2015   Procedure: EXPLORATORY LAPAROTOMY;  Surgeon: Glenna Fellows, MD;  Location: [facility];  Service: General;;   LYSIS OF ADHESION  08/10/2015   Procedure: LYSIS OF ADHESION;  Surgeon: Glenna Fellows, MD;  Location: [facility];  Service: General;;   small bowel obstruction  06/1996   transthoracic cardiolite   11/03/2006    Current Medications: Current Meds  Medication Sig   acetaminophen (TYLENOL) 500 MG tablet May increase to 1000 mg (2 tablets) three times daily if needed for pain   albuterol (PROVENTIL) (2.5 MG/3ML) 0.083% nebulizer solution Take 3 mLs (2.5 mg total) by nebulization every 6 (six) hours as needed for wheezing or shortness of breath.   cholecalciferol (VITAMIN D) 400 units TABS tablet Take 400 Units by mouth.   polyethylene glycol powder (GLYCOLAX/MIRALAX) 17 GM/SCOOP powder Take 17 g by mouth daily. As needed for constipation   YUPELRI 175 MCG/3ML nebulizer solution NEBULIZE CONTENTS OF 1 VIAL 1 TIME A DAY   [DISCONTINUED] amLODipine (NORVASC) 2.5 MG tablet TAKE 1 TABLET BY MOUTH EVERY DAY   [DISCONTINUED] Multiple Vitamin (MULTIVITAMIN WITH MINERALS) TABS tablet Take 1 tablet by mouth daily.     Allergies:   Patient has no known allergies.   Social History   Socioeconomic History   Marital status: Widowed    Spouse name: Not on file   Number of children: Not on file   Years of education: Not on file   Highest education level: Not on file  Occupational History   Not on file  Tobacco Use   Smoking status: Former    Packs/day: 0.10    Years: 30.00    Total pack years: 3.00    Types: Cigarettes    Quit date: 01/24/2010    Years since quitting: 12.3   Smokeless tobacco: Never   Tobacco comments:    smoked 1-2 cigarettes/day  Vaping Use   Vaping Use: Never used  Substance and Sexual Activity   Alcohol use: No    Alcohol/week: 0.0 standard drinks of alcohol   Drug use: No    Sexual activity: Not Currently  Other Topics Concern   Not on file  Social History Narrative   Not on file   Social Determinants of Health   Financial Resource Strain: Not on file  Food Insecurity: Not on file  Transportation Needs: Not on file  Physical Activity: Not on file  Stress: Not on file  Social Connections: Not on file     Family History: The patient's family history is not on file.  ROS:   Please see the history of present illness.  All other systems reviewed and are negative.  Labs/Other Studies Reviewed:    The following studies were reviewed today:  Prior CV studies: Echocardiogram 02/01/2019 EF 60-65, mild LVH, Gr 1 DD, RVSP 21, severe BAE, mod to severe AI, mod to severe AS (mean 35 mmHg, Vmax  393 cm/s, DI 0.31), GLS-17.9%   Recent Labs: No results found for requested labs within last 365 days.  Recent Lipid Panel    Component Value Date/Time   CHOL 198 01/10/2015 1611   TRIG 68 08/18/2015 0531   TRIG 103 10/27/2006 1030   HDL 88.60 01/10/2015 1611   CHOLHDL 2 01/10/2015 1611   VLDL 10.2 01/10/2015 1611   LDLCALC 99 01/10/2015 1611   LDLDIRECT 104.6 03/27/2013 1405     Risk Assessment/Calculations:           Physical Exam:    VS:  BP 116/64   Pulse (!) 108   Ht 5' (1.524 m)   Wt 96 lb 13.6 oz (43.9 kg)   SpO2 95%   BMI 18.91 kg/m     Wt Readings from Last 3 Encounters:  06/15/22 96 lb 13.6 oz (43.9 kg)  02/25/22 90 lb (40.8 kg)  11/24/21 91 lb 6.4 oz (41.5 kg)     GEN: Frail elderly female in no acute distress HEENT: Normal NECK: No JVD; No carotid bruits CARDIAC: RRR, 2/6 systolic murmur, rubs, gallops RESPIRATORY:  Clear to auscultation without rales, wheezing or rhonchi  ABDOMEN: Soft, non-tender, non-distended MUSCULOSKELETAL:  No edema; No deformity. 2+ pedal pulses, equal bilaterally SKIN: Warm and dry NEUROLOGIC:  Alert and disoriented PSYCHIATRIC:  Pleasant   EKG:  EKG is ordered today.  The ekg ordered today  demonstrates sinus tachycardia at 108 bpm, nonspecific T wave abnormality, no acute change from previous  Diagnoses:    1. Nonrheumatic aortic valve stenosis   2. Essential hypertension   3. Sinus tachycardia   4. Generalized pain    Assessment and Plan:     Nonrheumatic aortic valve stenosis: Moderate to severe aortic stenosis by echo 01/2019. I do not appreciate a significant murmur on exam.  She does not appear to be symptomatic.  Discussed with granddaughter and we agree that patient would not likely be a candidate for intervention.  Hypertension: BP is well controlled. Family does not monitor at home.  She is having difficulty swallowing her pills. I advised she may discontinue amlodipine.  Sinus tachycardia: HR is elevated today. Granddaughter does not monitor at home. She thinks it may be related to moving the patient around for an EKG during the office visit.  I rechecked it further into the office visit and heart rate is 100 bpm. No indication for further testing.   Generalized pain: Recommended Tylenol 500 to 1000 mg 3 times a day as needed for pain.  Encouraged granddaughter to start with 500 mg due to patient's petite size.      Disposition: Follow-up as needed  Medication Adjustments/Labs and Tests Ordered: Current medicines are reviewed at length with the patient today.  Concerns regarding medicines are outlined above.  Orders Placed This Encounter  Procedures   EKG 12-Lead   Meds ordered this encounter  Medications   acetaminophen (TYLENOL) 500 MG tablet    Sig: May increase to 1000 mg (2 tablets) three times daily if needed for pain    Dispense:  90 tablet    Refill:  1    Order Specific Question:   Supervising Provider    Answer:   Vesta Mixer [[id number]]    Patient Instructions  Medication Instructions:   START Tylenol 1-2 tablets by mouth (500 -1000 mg ) as needed up to three times daily.   DISCONTINUE Multi vitamin  DISCONTINUE Amlodipine.   *If you  need a refill on your  cardiac medications before your next appointment, please call your pharmacy*   Lab Work:  None ordered.  If you have labs (blood work) drawn today and your tests are completely normal, you will receive your results only by: MyChart Message (if you have MyChart) OR A paper copy in the mail If you have any lab test that is abnormal or we need to change your treatment, we will call you to review the results.   Testing/Procedures:  None ordered.   Follow-Up: At [facility], you and your health needs are our priority.  As part of our continuing mission to provide you with exceptional heart care, we have created designated Provider Care Teams.  These Care Teams include your primary Cardiologist (physician) and Advanced Practice Providers (APPs -  Physician Assistants and Nurse Practitioners) who all work together to provide you with the care you need, when you need it.  We recommend signing up for the patient portal called "MyChart".  Sign up information is provided on this After Visit Summary.  MyChart is used to connect with patients for Virtual Visits (Telemedicine).  Patients are able to view lab/test results, encounter notes, upcoming appointments, etc.  Non-urgent messages can be sent to your provider as well.   To learn more about what you can do with MyChart, go to [email or web address].    Your next appointment:   As needed  The format for your next appointment:   As needed.    If primary card or EP is not listed click here to update    :1}    Important Information About Sugar         Signed, Levi Aland, NP  06/15/2022 4:02 PM    [facility]

## 2022-06-15 ENCOUNTER — Encounter: Payer: Self-pay | Admitting: Nurse Practitioner

## 2022-06-15 ENCOUNTER — Ambulatory Visit (INDEPENDENT_AMBULATORY_CARE_PROVIDER_SITE_OTHER): Payer: Medicare Other | Admitting: Nurse Practitioner

## 2022-06-15 VITALS — BP 116/64 | HR 108 | Ht 60.0 in | Wt 96.8 lb

## 2022-06-15 DIAGNOSIS — I10 Essential (primary) hypertension: Secondary | ICD-10-CM

## 2022-06-15 DIAGNOSIS — R000 Tachycardia, unspecified: Secondary | ICD-10-CM | POA: Diagnosis not present

## 2022-06-15 DIAGNOSIS — R52 Pain, unspecified: Secondary | ICD-10-CM

## 2022-06-15 DIAGNOSIS — I350 Nonrheumatic aortic (valve) stenosis: Secondary | ICD-10-CM | POA: Diagnosis not present

## 2022-06-15 MED ORDER — ACETAMINOPHEN 500 MG PO TABS: ORAL_TABLET | ORAL | 1 refills | Status: AC

## 2022-06-15 NOTE — Patient Instructions (Signed)
Medication Instructions:   START Tylenol 1-2 tablets by mouth (500 -1000 mg ) as needed up to three times daily.   DISCONTINUE Multi vitamin  DISCONTINUE Amlodipine.   *If you need a refill on your cardiac medications before your next appointment, please call your pharmacy*   Lab Work:  None ordered.  If you have labs (blood work) drawn today and your tests are completely normal, you will receive your results only by: MyChart Message (if you have MyChart) OR A paper copy in the mail If you have any lab test that is abnormal or we need to change your treatment, we will call you to review the results.   Testing/Procedures:  None ordered.   Follow-Up: At [facility], you and your health needs are our priority.  As part of our continuing mission to provide you with exceptional heart care, we have created designated Provider Care Teams.  These Care Teams include your primary Cardiologist (physician) and Advanced Practice Providers (APPs -  Physician Assistants and Nurse Practitioners) who all work together to provide you with the care you need, when you need it.  We recommend signing up for the patient portal called "MyChart".  Sign up information is provided on this After Visit Summary.  MyChart is used to connect with patients for Virtual Visits (Telemedicine).  Patients are able to view lab/test results, encounter notes, upcoming appointments, etc.  Non-urgent messages can be sent to your provider as well.   To learn more about what you can do with MyChart, go to [email or web address].    Your next appointment:   As needed  The format for your next appointment:   As needed.    If primary card or EP is not listed click here to update    :1}    Important Information About Sugar

## 2022-07-19 ENCOUNTER — Ambulatory Visit: Payer: Medicare Other | Admitting: Medical

## 2022-07-19 NOTE — Progress Notes (Deleted)
DNR CHF COPD HTN  Anemia PAD Dementia Urinary incontience  Lives with granddaughter Desiree Hutchinson palliative  Wanting HH aid to help with ADLs  Declined palliative, mutple phone cals unanswered

## 2022-08-09 ENCOUNTER — Ambulatory Visit: Payer: Medicare Other | Admitting: Medical

## 2022-08-22 ENCOUNTER — Other Ambulatory Visit: Payer: Self-pay | Admitting: Cardiovascular Disease

## 2022-11-19 ENCOUNTER — Emergency Department (HOSPITAL_COMMUNITY)
Admission: EM | Admit: 2022-11-19 | Discharge: 2022-11-29 | Disposition: A | Discharge status: Home / Self Care | Payer: Medicare Other | Attending: Emergency Medicine | Admitting: Emergency Medicine

## 2022-11-19 ENCOUNTER — Emergency Department (HOSPITAL_COMMUNITY): Payer: Medicare Other

## 2022-11-19 DIAGNOSIS — I5032 Chronic diastolic (congestive) heart failure: Secondary | ICD-10-CM | POA: Insufficient documentation

## 2022-11-19 DIAGNOSIS — R102 Pelvic and perineal pain: Secondary | ICD-10-CM | POA: Diagnosis not present

## 2022-11-19 DIAGNOSIS — Z789 Other specified health status: Secondary | ICD-10-CM | POA: Diagnosis not present

## 2022-11-19 DIAGNOSIS — F0390 Unspecified dementia without behavioral disturbance: Secondary | ICD-10-CM | POA: Diagnosis not present

## 2022-11-19 DIAGNOSIS — R638 Other symptoms and signs concerning food and fluid intake: Secondary | ICD-10-CM | POA: Diagnosis not present

## 2022-11-19 DIAGNOSIS — R5381 Other malaise: Secondary | ICD-10-CM | POA: Diagnosis not present

## 2022-11-19 DIAGNOSIS — R29898 Other symptoms and signs involving the musculoskeletal system: Secondary | ICD-10-CM

## 2022-11-19 DIAGNOSIS — Z66 Do not resuscitate: Secondary | ICD-10-CM | POA: Diagnosis not present

## 2022-11-19 DIAGNOSIS — R52 Pain, unspecified: Secondary | ICD-10-CM

## 2022-11-19 DIAGNOSIS — Z7189 Other specified counseling: Secondary | ICD-10-CM | POA: Diagnosis not present

## 2022-11-19 DIAGNOSIS — M79606 Pain in leg, unspecified: Secondary | ICD-10-CM | POA: Diagnosis not present

## 2022-11-19 DIAGNOSIS — I110 Hypertensive heart disease with heart failure: Secondary | ICD-10-CM | POA: Diagnosis not present

## 2022-11-19 DIAGNOSIS — Z515 Encounter for palliative care: Secondary | ICD-10-CM | POA: Diagnosis not present

## 2022-11-19 DIAGNOSIS — I959 Hypotension, unspecified: Secondary | ICD-10-CM | POA: Diagnosis not present

## 2022-11-19 LAB — BASIC METABOLIC PANEL
Anion gap: 10 (ref 5–15)
BUN: 15 mg/dL (ref 8–23)
CO2: 20 mmol/L — ABNORMAL LOW (ref 22–32)
Calcium: 9.2 mg/dL (ref 8.9–10.3)
Chloride: 108 mmol/L (ref 98–111)
Creatinine, Ser: 1.04 mg/dL — ABNORMAL HIGH (ref 0.44–1.00)
GFR, Estimated: 48 mL/min — ABNORMAL LOW (ref >60)
Glucose, Bld: 91 mg/dL (ref 70–99)
Potassium: 3.9 mmol/L (ref 3.5–5.1)
Sodium: 138 mmol/L (ref 135–145)

## 2022-11-19 LAB — CBC
HCT: 40.1 % (ref 36.0–46.0)
Hemoglobin: 13.6 g/dL (ref 12.0–15.0)
MCH: 33.1 pg (ref 26.0–34.0)
MCHC: 33.9 g/dL (ref 30.0–36.0)
MCV: 97.6 fL (ref 80.0–100.0)
Platelets: 210 10*3/uL (ref 150–400)
RBC: 4.11 MIL/uL (ref 3.87–5.11)
RDW: 13.9 % (ref 11.5–15.5)
WBC: 4.6 10*3/uL (ref 4.0–10.5)
nRBC: 0 % (ref 0.0–0.2)

## 2022-11-19 MED ORDER — OXYCODONE HCL 5 MG/5ML PO SOLN: 5.0000 mg | ORAL | Status: DC | PRN

## 2022-11-19 MED ORDER — LORAZEPAM 2 MG/ML IJ SOLN: 1.0000 mg | INTRAMUSCULAR | Status: DC | PRN

## 2022-11-19 MED ORDER — OXYCODONE HCL 5 MG PO TABS
5.0000 mg | ORAL_TABLET | ORAL | Status: DC | PRN
  Administered 2022-11-21: 5 mg via ORAL
  Filled 2022-11-19: qty 1

## 2022-11-19 MED ORDER — GLYCOPYRROLATE 0.2 MG/ML IJ SOLN: 0.2000 mg | INTRAMUSCULAR | Status: DC | PRN

## 2022-11-19 MED ORDER — ACETAMINOPHEN 160 MG/5ML PO SOLN
650.0000 mg | Freq: Four times a day (QID) | ORAL | Status: DC | PRN
  Administered 2022-11-21: 650 mg via ORAL
  Filled 2022-11-19: qty 20.3

## 2022-11-19 MED ORDER — ACETAMINOPHEN 160 MG/5ML PO SOLN
650.0000 mg | Freq: Three times a day (TID) | ORAL | Status: DC
  Administered 2022-11-19: 650 mg via ORAL
  Filled 2022-11-19: qty 20.3

## 2022-11-19 MED ORDER — HALOPERIDOL 1 MG PO TABS: 2.0000 mg | ORAL_TABLET | Freq: Four times a day (QID) | ORAL | Status: DC | PRN

## 2022-11-19 MED ORDER — REVEFENACIN 175 MCG/3ML IN SOLN
175.0000 ug | Freq: Every day | RESPIRATORY_TRACT | Status: DC
  Administered 2022-11-24 – 2022-11-29 (×5): 175 ug via RESPIRATORY_TRACT
  Filled 2022-11-19 (×15): qty 3

## 2022-11-19 MED ORDER — LORAZEPAM 2 MG/ML PO CONC: 1.0000 mg | ORAL | Status: DC | PRN

## 2022-11-19 MED ORDER — DIPHENHYDRAMINE HCL 50 MG/ML IJ SOLN: 12.5000 mg | INTRAMUSCULAR | Status: DC | PRN

## 2022-11-19 MED ORDER — BIOTENE DRY MOUTH MT LIQD
15.0000 mL | Freq: Two times a day (BID) | OROMUCOSAL | Status: DC
  Administered 2022-11-22 – 2022-11-28 (×6): 15 mL via TOPICAL

## 2022-11-19 MED ORDER — POLYETHYLENE GLYCOL 3350 17 G PO PACK: 17.0000 g | PACK | Freq: Every day | ORAL | Status: DC

## 2022-11-19 MED ORDER — GLYCOPYRROLATE 1 MG PO TABS: 1.0000 mg | ORAL_TABLET | ORAL | Status: DC | PRN

## 2022-11-19 MED ORDER — LORAZEPAM 1 MG PO TABS: 1.0000 mg | ORAL_TABLET | ORAL | Status: DC | PRN

## 2022-11-19 MED ORDER — ALBUTEROL SULFATE (2.5 MG/3ML) 0.083% IN NEBU: 2.5000 mg | INHALATION_SOLUTION | Freq: Four times a day (QID) | RESPIRATORY_TRACT | Status: DC | PRN

## 2022-11-19 MED ORDER — POLYVINYL ALCOHOL 1.4 % OP SOLN: 1.0000 [drp] | Freq: Four times a day (QID) | OPHTHALMIC | Status: DC | PRN

## 2022-11-19 MED ORDER — HALOPERIDOL LACTATE 2 MG/ML PO CONC
2.0000 mg | Freq: Four times a day (QID) | ORAL | Status: DC | PRN
  Filled 2022-11-19: qty 5

## 2022-11-19 MED ORDER — ONDANSETRON HCL 4 MG/2ML IJ SOLN: 4.0000 mg | Freq: Four times a day (QID) | INTRAMUSCULAR | Status: DC | PRN

## 2022-11-19 MED ORDER — HALOPERIDOL LACTATE 5 MG/ML IJ SOLN: 2.0000 mg | Freq: Four times a day (QID) | INTRAMUSCULAR | Status: DC | PRN

## 2022-11-19 MED ORDER — ONDANSETRON 4 MG PO TBDP: 4.0000 mg | ORAL_TABLET | Freq: Four times a day (QID) | ORAL | Status: DC | PRN

## 2022-11-19 NOTE — ED Notes (Signed)
Family educated on plan of care and to notify RN if they feel as though they are noticing an increase in agitation or pain. Pt eating dinner in NAD or noticeable pain at this time

## 2022-11-19 NOTE — Progress Notes (Signed)
Transition of Care [facility]) - Emergency Department Mini Assessment   Patient Details  Name: Desiree Hutchinson MRN: [id number] Date of Birth: 1920/08/07  Transition of Care [facility]) CM/SW Contact:    Georgie Chard, LCSW Phone Number: 11/19/2022, 7:52 PM   Clinical Narrative:  The patient's granddaughter reports that the brother left the patient due to him having a mental breakdown. The patients granddaughter states that they need help during the day and can not care for her due to the granddaughter working. CSW advised the patient granddaughter that the patient can not stay in the ED long term and we do not do LTC placement. CSW has reached out to [facility] to help cordate a placement for this patient. The granddaughter was also advised to reach out to [facility] , the patient also needs to file for medicaid. CSW also referred the patient to a place for mom and also gave the granddaughter numbers to call on Monday.   ED Mini Assessment: What brought you to the Emergency Department? : Diaper Rash  Barriers to Discharge: No Barriers Identified  Barrier interventions: The family can not care for the patient     Interventions which prevented an admission or readmission: Other (must enter comment) (Patient was left home neglicted.)    Patient Contact and Communications        ,                 Admission diagnosis:  Weakness Patient Active Problem List   Diagnosis Date Noted   Senile dementia (HCC) 02/25/2022   Body mass index less than 19, adult 02/25/2022   Leg weakness, bilateral 02/25/2022   Uses walker 02/25/2022   Encopresis 02/25/2022   Diurnal enuresis 02/25/2022   Breast pain, left 11/24/2021   Protein-calorie malnutrition, severe 01/11/2020   Do not resuscitate 01/09/2020   Asymptomatic menopausal state 06/20/2017   Hypocalcemia 09/30/2016   UTI (urinary tract infection) 09/30/2016   Hematochezia    Leukocytopenia    Thrombocytopenia (HCC)    GI bleed 09/12/2016    Acute GI bleeding 09/12/2016   Hemorrhagic shock (HCC)    PAD (peripheral artery disease) (HCC) 04/06/2016   Iron deficiency anemia 02/13/2016   Urinary incontinence 09/26/2015   Chronic diastolic CHF (congestive heart failure) (HCC) 08/20/2015   SBO (small bowel obstruction) (HCC) 08/06/2015   Acute kidney injury superimposed on chronic kidney disease (HCC) 08/06/2015   Protein-calorie malnutrition (HCC) 07/21/2015   Lower GI bleed 07/19/2015   Diverticulosis of colon with hemorrhage 07/19/2015   Acute blood loss anemia 07/19/2015   Constipation 01/10/2015   Loss of weight 01/10/2015   Memory loss of 01/10/2015   Hearing loss 01/08/2014   Renal insufficiency 03/27/2013   Encounter for long-term (current) use of other medications 03/27/2013   Aortic valve disorder 06/15/2010   CHRONIC RHINITIS 06/26/2009   SHORTNESS OF BREATH 05/29/2009   OTHER DYSPNEA AND RESPIRATORY ABNORMALITIES 05/29/2009   PRECORDIAL PAIN 05/29/2009   COPD with emphysema Gold Stage A 05/28/2009   SMALL BOWEL OBSTRUCTION, HX OF 05/28/2009   GASTRITIS 03/28/2008   COUGH 12/07/2007   Essential hypertension 10/14/2007   Hypotension 10/14/2007   VENTRICULAR HYPERTROPHY, LEFT 10/14/2007   ALLERGIC RHINITIS 10/14/2007   CHEST PAIN, NON-CARDIAC 10/14/2007   TOBACCO USE, QUIT 10/14/2007   PCP:  Jake Shark, PA-C Pharmacy:   [facility] [phone] [place], [place] - [address] [place] [address] Phone: [phone] Fax: [phone]  [facility] - [place], [place] -  [address] [place] [address] Phone: [phone] Fax: [phone]

## 2022-11-19 NOTE — Consult Note (Signed)
Medical Consultation  Desiree Hutchinson  KVQ:[id number]  DOB: 1920-01-07  DOA: 11/19/2022 PCP: Lexine Baton   Requesting physician: Clent Ridges  Date of consultation: 11/16/22 Reason for consultation: assessment for admission  Impression/Recommendations Assessment and Plan:  This is a debilitated 88 y/o woman with dementia whose family needs assistance in caring for her. We have asked TOC to speak with them.  There is no medical indication to admit this patient to the hospital.     HPI:  Desiree Hutchinson is an 88 y.o. female who has dementia, COPD, HTN, Anemia and Aortic valve regurgitation and is brought to the hospital by her grand daughter.The patient lives with this granddaughter and her husband and was previously also being cared for by her grandson. Her grandson is no longer able to assist in the care of this patient and now the grand daughter is the primary caregiver. She brings her to the hospital asking for assistance in helping to care for the patient. The grand daughter also states that the patient has complained of pain when her diaper is being changed.  The patient is wheelchair/ bed bound at baseline and her dementia is severe. The patient has no complaints of pain here in the ED and there are no other concerns from her family.   Review of Systems  Unable to obtain due to dementia  Past Medical History:  Diagnosis Date   Acute lower GI bleeding 01/09/2020   Allergic rhinitis    Anemia    Aortic valve disease    regurge > stenosis   Chest pain, non-cardiac    Chronic diastolic heart failure (HCC)    COPD (chronic obstructive pulmonary disease) (HCC)    Cough    Gastritis    History of atherosclerotic cardiovascular disease    History of small bowel obstruction    Hypertension    Left ventricular hypertrophy    Obesity    Personal history of tobacco use, presenting hazards to  health     Past Surgical History:  Procedure Laterality Date   adenoasine cardiolite  11/09/2004   APPENDECTOMY     BOWEL RESECTION N/A 08/10/2015   Procedure: SMALL BOWEL OBSTRUCTION;  Surgeon: Glenna Fellows, MD;  Location: [facility];  Service: General;  Laterality: N/A;   ESOPHAGOGASTRODUODENOSCOPY  11/09/1999   HERNIA REPAIR     IR GENERIC HISTORICAL  09/12/2016   IR FLUORO GUIDE CV LINE RIGHT 09/12/2016 Malachy Moan, MD [facility]   IR GENERIC HISTORICAL  09/12/2016   IR US GUIDE VASC ACCESS RIGHT 09/12/2016 Malachy Moan, MD [facility]   IR GENERIC HISTORICAL  09/12/2016   IR ANGIOGRAM SELECTIVE EACH ADDITIONAL VESSEL 09/12/2016 Malachy Moan, MD [facility]   IR GENERIC HISTORICAL  09/12/2016   IR ANGIOGRAM VISCERAL SELECTIVE 09/12/2016 Malachy Moan, MD [facility]   IR GENERIC HISTORICAL  09/12/2016   IR ANGIOGRAM SELECTIVE EACH ADDITIONAL VESSEL 09/12/2016 Malachy Moan, MD [facility]   IR GENERIC HISTORICAL  09/12/2016   IR ANGIOGRAM SELECTIVE EACH ADDITIONAL VESSEL 09/12/2016 Malachy Moan, MD [facility]   IR GENERIC HISTORICAL  09/12/2016   IR US GUIDE VASC ACCESS RIGHT 09/12/2016 Malachy Moan, MD [facility]   IR GENERIC HISTORICAL  09/12/2016   IR ANGIOGRAM SELECTIVE EACH ADDITIONAL VESSEL 09/12/2016 Malachy Moan, MD [facility]   IR GENERIC HISTORICAL  09/12/2016   IR EMBO ART  VEN HEMORR LYMPH EXTRAV  INC GUIDE ROADMAPPING 09/12/2016 Malachy Moan, MD [facility]   IR  GENERIC HISTORICAL  09/12/2016   IR ANGIOGRAM SELECTIVE EACH ADDITIONAL VESSEL 09/12/2016 Malachy Moan, MD [facility]   LAPAROTOMY  08/10/2015   Procedure: EXPLORATORY LAPAROTOMY;  Surgeon: Glenna Fellows, MD;  Location: [facility];  Service: General;;   LYSIS OF ADHESION  08/10/2015   Procedure: LYSIS OF ADHESION;  Surgeon: Glenna Fellows, MD;  Location: [facility];  Service: General;;   small bowel obstruction  06/1996   transthoracic cardiolite   11/03/2006     Social History:  reports that she quit smoking about 12 years ago. Her smoking use included cigarettes. She has a 3.00 pack-year smoking history. She has never used smokeless tobacco. She reports that she does not drink alcohol and does not use drugs.  No Known Allergies  No family history on file.  Prior to Admission medications   Medication Sig Start Date End Date Taking? Authorizing Provider  acetaminophen (TYLENOL) 500 MG tablet May increase to 1000 mg (2 tablets) three times daily if needed for pain 06/15/22   Swinyer, Zachary George, NP  albuterol (PROVENTIL) (2.5 MG/3ML) 0.083% nebulizer solution Take 3 mLs (2.5 mg total) by nebulization every 6 (six) hours as needed for wheezing or shortness of breath. 11/24/21 11/24/22  Parrett, Virgel Bouquet, NP  cholecalciferol (VITAMIN D) 400 units TABS tablet Take 400 Units by mouth.    [provider]  polyethylene glycol powder (GLYCOLAX/MIRALAX) 17 GM/SCOOP powder Take 17 g by mouth daily. As needed for constipation 01/12/20   Westley Chandler, MD  YUPELRI 175 MCG/3ML nebulizer solution NEBULIZE CONTENTS OF 1 VIAL 1 TIME A DAY 04/29/22   Chilton Greathouse, MD    Physical Exam: Blood pressure 110/81, pulse 80, temperature 98.6 F (37 C), temperature source Axillary, resp. rate 20, SpO2 99 %. @VITALS2 @ There were no vitals filed for this visit. No intake or output data in the 24 hours ending 11/19/22 1836   Constitutional: Appears well-developed and well-nourished. No distress. HENT: Normocephalic. External right and left ear normal. Oropharynx is clear and moist.  Eyes: Conjunctivae and EOM are normal. PERRLA, no scleral icterus.  Neck: Normal ROM. Neck supple. No JVD. No tracheal deviation. No thyromegaly.  CVS: RRR, S1/S2 +, no murmurs, no gallops, no carotid bruit.  Pulmonary: Effort and breath sounds normal, no stridor, rhonchi, wheezes, rales.  Abdominal: Soft. BS +,  no distension, tenderness, rebound or guarding.  Musculoskeletal:  Normal range of motion. No edema and no tenderness.  Neuro: Alert. Confused and does not answer most questions. Normal muscle tone and moves arms independently. Follows commands intermittently Skin: Skin is warm and dry. No rash noted. Not diaphoretic. No erythema. No pallor.     Labs on Admission:  Basic Metabolic Panel: Recent Labs  Lab 11/19/22 1616  NA 138  K 3.9  CL 108  CO2 20*  GLUCOSE 91  BUN 15  CREATININE 1.04*  CALCIUM 9.2   Liver Function Tests: No results for input(s): "AST", "ALT", "ALKPHOS", "BILITOT", "PROT", "ALBUMIN" in the last 168 hours. No results for input(s): "LIPASE", "AMYLASE" in the last 168 hours. No results for input(s): "AMMONIA" in the last 168 hours. CBC: Recent Labs  Lab 11/19/22 1616  WBC 4.6  HGB 13.6  HCT 40.1  MCV 97.6  PLT 210   Cardiac Enzymes: No results for input(s): "CKTOTAL", "CKMB", "CKMBINDEX", "TROPONINI" in the last 168 hours. BNP: Invalid input(s): "POCBNP" CBG: No results for input(s): "GLUCAP" in the last 168 hours.  Radiological Exams on Admission: DG Pelvis Portable  Result Date:  11/19/2022 CLINICAL DATA:  pain EXAM: PORTABLE PELVIS 1-2 VIEWS COMPARISON:  08/09/2015 FINDINGS: Bilateral hip DJD with narrowing of the articular cartilage. No fracture or dislocation. Diffuse osteopenia. Aortoiliac arterial calcifications. IMPRESSION: Bilateral hip DJD. No acute findings. Electronically Signed   By: Corlis Leak M.D.   On: 11/19/2022 16:47        Calvert Cantor, MD [facility] 11/19/2022, 6:36 PM

## 2022-11-19 NOTE — Discharge Planning (Signed)
RNCM spoke with EDP regarding pt family no longer able to care for her.  RNCM explained that SNF placement will not be possible from ED/hospital setting without 3 day inpatient stay.  RNCM offered home health option and resources for private pay care.  Family adamant that home is not an option at this time.  TOC will arrange family meeting with leadership to discuss safe disposition plan for patient.

## 2022-11-19 NOTE — Discharge Instructions (Signed)
Private Pay Resources  [facility] Address: [address], [place], [place] [address] Phone: [phone]  [facility] Address: [address], [place], [place] [address] Phone: [phone]  [facility] Address: [address], [place], [place] [address] Phone: [phone]  [facility] Address: [address], [place], [place] [address] Phone: [phone]  [facility] Address: [address], [place], [place] [address] Phone: [phone]  [facility] Phone: [phone]  [facility] Address:  [address], [place],  [address] Phone:  [phone]  [facility] Address:  [address] Phone:  [phone]  [email or web address]  [facility] Phone: [phone]   

## 2022-11-19 NOTE — Consult Note (Addendum)
Consultation Note Date: 11/19/2022   Patient Name: Desiree Hutchinson  DOB: 1920/09/19  MRN: [id number]  Age / Sex: 88 y.o., female  PCP: Marcellina Millin Referring Physician: Malvin Johns, MD  Reason for Consultation: Establishing goals of care, "Patient here with pain. Seems to have pain at baseline, daughter states goal of care "is for her mother to not be in pain" anymore."  HPI/Patient Profile: 88 y.o. female  with past medical history of anemia, aortic valve, chronic diastolic heart failure, COPD, gastritis, and hypertension presented to ED on 11/19/22 from home with family concerns of increased pain.   Clinical Assessment and Goals of Care: I have reviewed medical records including EPIC notes, labs, and imaging. Received report from primary RN - no acute concerns.   Went to visit patient at bedside - granddaughter/HCPOA/Evette and her significant other/Reginald present. Patient was lying in bed awake, alert, disoriented, and able to participate in minimal conversation. She is very hard of hearing. No signs or non-verbal gestures of pain or discomfort noted. No respiratory distress, increased work of breathing, or secretions noted. Patient denies pain.  Met with Evette and Mliss Fritz  to discuss diagnosis, prognosis, GOC, EOL wishes, disposition, and options.  I introduced Palliative Medicine as specialized medical care for people living with serious illness. It focuses on providing relief from the symptoms and stress of a serious illness. The goal is to improve quality of life for both the patient and the family.  We discussed a brief life review of the patient as well as functional and nutritional status. Patient is not married and all of her children have passed away. Prior to hospitalization, patient was living with York Grice and Mliss Fritz - they are her primary caregivers. Both Evette and Mliss Fritz work,  which leaves long periods of time where patient is not supervised. Evette's brother (patient's grandson) stayed with patient as primary caregiver during these times until last week; he is not interested in participating in her care any longer. Patient does not have any HH services or private caregivers. Patient is mostly bedbound - was able to transfer to wheelchair with assistance but York Grice tells me this has gotten harder over the last two weeks as she feels patient's pain has increased (not due to increased weakness) - Evette worries she is hurting the patient.   Evette tells me patient's pain is "mostly in her legs and feet." Evette is uncertain if patient's pain is constant vs intermittent with movement. It is certain patient has pain with movement, but family does not feel she is in pain while lying still.   Per Evette, cardiology "took her off all her meds because she couldn't take them." Patient cannot tolerate pills whole or crushed.    Family understand that for adequate pain management, patient would need continued follow up with outpatient Palliative Care and/or PCP vs hospice. Discussed symptom management options and varius routes medications can be administered.  Discussion on the difference between Palliative and Hospice care. Palliative care and hospice have similar goals of managing symptoms, promoting comfort, improving quality of life, and maintaining a person's dignity. However, palliative care may be offered during any phase of a serious illness, while hospice care is usually offered when a person is expected to live for 6 months or less.  Provided education and counseling at length on the philosophy and benefits of hospice care. Discussed that it offers a holistic approach to care in the setting of end-stage illness, and is about supporting the patient where  they are allowing nature to take it's course. Discussed the hospice team includes RNs, physicians, social workers, and  chaplains. They can provide personal care, support for the family, and help keep patient out of the hospital as well as assist with DME needs for home hospice. Education provided on the difference between home vs residential hospice.   Family understand that patient needs 24/7 supervision/assistance and feel unsafe taking her home as they cannot provide this care. Discussed options to include family provided care vs private duty caregivers vs LTC. Family are most interested in finding patient a facility. They are open to her discharge to LTC with hospice. Family understand they will need to discuss further with TOC.  Patient does have a HCPOA. HCPOA #1 passed away in Feb 19, 2023, listing Evette as #2. See ACP tab for document.  We talked about transition to comfort measures in house and what that would entail inclusive of medications to control pain, dyspnea, agitation, nausea, and itching. We discussed stopping all unnecessary measures such as blood draws, needle sticks, oxygen, antibiotics, CBGs/insulin, cardiac monitoring, IVF, and frequent vital signs. Education provided that other non-pharmacological interventions would be utilized for holistic support and comfort such as spiritual support if requested, repositioning, music therapy, offering comfort feeds, and/or therapeutic listening. Family are agreeable to full comfort while in house without further work up, as goal is to focus on patient's pain.   Visit also consisted of discussions dealing with the complex and emotionally intense issues of symptom management and palliative care in the setting of serious and potentially life-threatening illness.   Discussed with patient/family the importance of continued conversation with each other and the medical providers regarding overall plan of care and treatment options, ensuring decisions are within the context of the patient's values and GOCs.    Questions and concerns were addressed. The patient/family  was encouraged to call with questions and/or concerns. PMT card was provided.   Primary Decision Maker: HCPOA/granddaughter - Evette Bethea    SUMMARY OF RECOMMENDATIONS   Initiated full comfort measures Continue DNR/DNI as previously documented Patient does not have 24/7 supervision/assistance at home. Family interested in getting her into LTC facility with hospice - TOC notified and consult placed Added orders for symptom management and to reflect full comfort measures, as well as discontinued orders that were not focused on comfort Unrestricted visitation orders were placed per current [facility] EOL visitation policy  Nursing to provide frequent assessments and administer PRN medications as clinically necessary to ensure EOL comfort PMT will continue to follow and support holistically  Symptom Management Scheduled tylenol TID Roxicodone PRN pain/dyspnea/increased work of breathing/RR>25 Tylenol PRN pain/fever Biotin twice daily Benadryl PRN itching Robinul PRN secretions Haldol PRN agitation/delirium Ativan PRN anxiety/seizure/sleep/distress Zofran PRN nausea/vomiting Liquifilm Tears PRN dry eye    Code Status/Advance Care Planning: DNR  Palliative Prophylaxis:  Aspiration, Bowel Regimen, Delirium Protocol, Frequent Pain Assessment, Oral Care, and Turn Reposition  Additional Recommendations (Limitations, Scope, Preferences): Full Comfort Care  Psycho-social/Spiritual:  Desire for further Chaplaincy support:no Created space and opportunity for patient and family to express thoughts and feelings regarding patient's current medical situation.  Emotional support and therapeutic listening provided.  Prognosis:  Unable to determine  Discharge Planning: To Be Determined      Primary Diagnoses: Present on Admission: **None**   I have reviewed the medical record, interviewed the patient and family, and examined the patient. The following aspects are  pertinent.  Past Medical History:  Diagnosis Date   Acute  lower GI bleeding 01/09/2020   Allergic rhinitis    Anemia    Aortic valve disease    regurge > stenosis   Chest pain, non-cardiac    Chronic diastolic heart failure (HCC)    COPD (chronic obstructive pulmonary disease) (HCC)    Cough    Gastritis    History of atherosclerotic cardiovascular disease    History of small bowel obstruction    Hypertension    Left ventricular hypertrophy    Obesity    Personal history of tobacco use, presenting hazards to health    Social History   Socioeconomic History   Marital status: Widowed    Spouse name: Not on file   Number of children: Not on file   Years of education: Not on file   Highest education level: Not on file  Occupational History   Not on file  Tobacco Use   Smoking status: Former    Packs/day: 0.10    Years: 30.00    Total pack years: 3.00    Types: Cigarettes    Quit date: 01/24/2010    Years since quitting: 12.8   Smokeless tobacco: Never   Tobacco comments:    smoked 1-2 cigarettes/day  Vaping Use   Vaping Use: Never used  Substance and Sexual Activity   Alcohol use: No    Alcohol/week: 0.0 standard drinks of alcohol   Drug use: No   Sexual activity: Not Currently  Other Topics Concern   Not on file  Social History Narrative   Not on file   Social Determinants of Health   Financial Resource Strain: Not on file  Food Insecurity: Not on file  Transportation Needs: Not on file  Physical Activity: Not on file  Stress: Not on file  Social Connections: Not on file   No family history on file. Scheduled Meds: Continuous Infusions: PRN Meds:. Medications Prior to Admission:  Prior to Admission medications   Medication Sig Start Date End Date Taking? Authorizing Provider  acetaminophen (TYLENOL) 500 MG tablet May increase to 1000 mg (2 tablets) three times daily if needed for pain 06/15/22   Swinyer, Lanice Schwab, NP  albuterol (PROVENTIL) (2.5  MG/3ML) 0.083% nebulizer solution Take 3 mLs (2.5 mg total) by nebulization every 6 (six) hours as needed for wheezing or shortness of breath. 11/24/21 11/24/22  Parrett, Fonnie Mu, NP  cholecalciferol (VITAMIN D) 400 units TABS tablet Take 400 Units by mouth.    [provider]  polyethylene glycol powder (GLYCOLAX/MIRALAX) 17 GM/SCOOP powder Take 17 g by mouth daily. As needed for constipation 01/12/20   Martyn Malay, MD  YUPELRI 175 MCG/3ML nebulizer solution NEBULIZE CONTENTS OF 1 VIAL 1 TIME A DAY 04/29/22   Mannam, Hart Robinsons, MD   No Known Allergies Review of Systems  Unable to perform ROS: Other    Physical Exam Vitals and nursing note reviewed.  Constitutional:      General: She is not in acute distress. Pulmonary:     Effort: No respiratory distress.  Skin:    General: Skin is warm and dry.  Neurological:     Mental Status: She is alert. She is disoriented and confused.     Motor: Weakness present.  Psychiatric:        Speech: Speech is delayed.        Behavior: Behavior is cooperative.        Cognition and Memory: Cognition is impaired. Memory is impaired.     Vital Signs: BP (!) 150/75  Pulse 83   Temp 98.6 F (37 C) (Axillary)   Resp 17   SpO2 99%  Pain Scale: PAINAD       SpO2: SpO2: 99 % O2 Device:SpO2: 99 % O2 Flow Rate: .   IO: Intake/output summary: No intake or output data in the 24 hours ending 11/19/22 1639  LBM:   Baseline Weight:   Most recent weight:       Palliative Assessment/Data: PPS 20-30%     Signed by: Lin Landsman, NP   Please contact Palliative Medicine Team phone at [phone] for questions and concerns.  For individual provider: See Amion  *Portions of this note are a verbal dictation therefore any spelling and/or grammatical errors are due to the "[employer]" system interpretation.

## 2022-11-19 NOTE — ED Provider Notes (Signed)
[facility] EMERGENCY DEPARTMENT Provider Note   CSN: [id number] Arrival date & time: 11/19/22  1545     History  Chief Complaint  Patient presents with   Leg Pain    Desiree GRENNELL is a 88 y.o. female with medical history of anemia, aortic valve, chronic diastolic heart failure, COPD, gastritis, hypertension.  Patient presents to ED for evaluation of pain.  Patient is here with her granddaughter who is her power of attorney and legal guardian.  The patient granddaughter states that earlier today, she was attempting to change her mother's diaper.  The patient granddaughter states that typically her mother will have pain with changes however today granddaughter states that the pain was worse than normal.  The patient granddaughter states that the patient began repeating "do not touch me, it hurts".  Patient granddaughter became very alarmed and presented to ED for evaluation.  Patient granddaughter states that the pain will have pain every time she is changed.  On examination the patient denies any complaints.  When asked what the patient granddaughter expectations were, the patient granddaughter replied "I do not want my mom to be in pain anymore".  Patient has dementia at baseline.   Leg Pain      Home Medications Prior to Admission medications   Medication Sig Start Date End Date Taking? Authorizing Provider  acetaminophen (TYLENOL) 500 MG tablet May increase to 1000 mg (2 tablets) three times daily if needed for pain 06/15/22   Swinyer, Zachary George, NP  albuterol (PROVENTIL) (2.5 MG/3ML) 0.083% nebulizer solution Take 3 mLs (2.5 mg total) by nebulization every 6 (six) hours as needed for wheezing or shortness of breath. 11/24/21 11/24/22  Parrett, Virgel Bouquet, NP  cholecalciferol (VITAMIN D) 400 units TABS tablet Take 400 Units by mouth.    [provider]  polyethylene glycol powder (GLYCOLAX/MIRALAX) 17 GM/SCOOP powder Take 17 g by mouth daily. As needed for  constipation 01/12/20   Westley Chandler, MD  YUPELRI 175 MCG/3ML nebulizer solution NEBULIZE CONTENTS OF 1 VIAL 1 TIME A DAY 04/29/22   Chilton Greathouse, MD      Allergies    Patient has no known allergies.    Review of Systems   Review of Systems  Unable to perform ROS: Dementia (Level 5 caveat)    Physical Exam Updated Vital Signs BP 110/81   Pulse 80   Temp 98.6 F (37 C) (Axillary)   Resp 20   SpO2 99%  Physical Exam Vitals and nursing note reviewed.  Constitutional:      General: She is not in acute distress.    Appearance: She is well-developed.  HENT:     Head: Normocephalic and atraumatic.  Eyes:     Conjunctiva/sclera: Conjunctivae normal.  Cardiovascular:     Rate and Rhythm: Normal rate and regular rhythm.  Pulmonary:     Effort: Pulmonary effort is normal. No respiratory distress.     Breath sounds: Normal breath sounds.  Abdominal:     Palpations: Abdomen is soft.     Tenderness: There is no abdominal tenderness.  Musculoskeletal:        General: No swelling.     Cervical back: Neck supple.  Skin:    General: Skin is warm and dry.     Capillary Refill: Capillary refill takes less than 2 seconds.  Neurological:     Mental Status: She is alert. Mental status is at baseline.     ED Results / Procedures / Treatments  Labs (all labs ordered are listed, but only abnormal results are displayed) Labs Reviewed  BASIC METABOLIC PANEL - Abnormal; Notable for the following components:      Result Value   CO2 20 (*)    Creatinine, Ser 1.04 (*)    GFR, Estimated 48 (*)    All other components within normal limits  CBC    EKG None  Radiology DG Pelvis Portable  Result Date: 11/19/2022 CLINICAL DATA:  pain EXAM: PORTABLE PELVIS 1-2 VIEWS COMPARISON:  08/09/2015 FINDINGS: Bilateral hip DJD with narrowing of the articular cartilage. No fracture or dislocation. Diffuse osteopenia. Aortoiliac arterial calcifications. IMPRESSION: Bilateral hip DJD. No acute  findings. Electronically Signed   By: Lucrezia Europe M.D.   On: 11/19/2022 16:47    Procedures Procedures   Medications Ordered in ED Medications  LORazepam (ATIVAN) tablet 1 mg (has no administration in time range)    Or  LORazepam (ATIVAN) 2 MG/ML concentrated solution 1 mg (has no administration in time range)    Or  LORazepam (ATIVAN) injection 1 mg (has no administration in time range)  haloperidol (HALDOL) tablet 2 mg (has no administration in time range)    Or  haloperidol (HALDOL) 2 MG/ML solution 2 mg (has no administration in time range)    Or  haloperidol lactate (HALDOL) injection 2 mg (has no administration in time range)  diphenhydrAMINE (BENADRYL) injection 12.5 mg (has no administration in time range)  ondansetron (ZOFRAN-ODT) disintegrating tablet 4 mg (has no administration in time range)    Or  ondansetron (ZOFRAN) injection 4 mg (has no administration in time range)  glycopyrrolate (ROBINUL) tablet 1 mg (has no administration in time range)    Or  glycopyrrolate (ROBINUL) injection 0.2 mg (has no administration in time range)    Or  glycopyrrolate (ROBINUL) injection 0.2 mg (has no administration in time range)  antiseptic oral rinse (BIOTENE) solution 15 mL (has no administration in time range)  polyvinyl alcohol (LIQUIFILM TEARS) 1.4 % ophthalmic solution 1 drop (has no administration in time range)  acetaminophen (TYLENOL) 160 MG/5ML solution 650 mg (650 mg Oral Given 11/19/22 1800)  acetaminophen (TYLENOL) 160 MG/5ML solution 650 mg (has no administration in time range)  oxyCODONE (Oxy IR/ROXICODONE) immediate release tablet 5 mg (has no administration in time range)    ED Course/ Medical Decision Making/ A&P                           Medical Decision Making Amount and/or Complexity of Data Reviewed Labs: ordered. Radiology: ordered.   88 year old female presents to ED for evaluation.  Please see HPI for further details.  On examination patient afebrile  nontachycardic.  Patient lung sounds clear bilaterally, she is not hypoxic.  Patient abdomen soft and compressible throughout.  Patient at baseline mentation status.  Patient nontoxic in appearance.  To begin patient work-up pelvic x-ray was completed, basic labs, urinalysis.  The pelvic x-ray shows no acute fractures.  The patient labs are at baseline.  Patient here with granddaughter who reports that that she does not wish for her mother to be in pain anymore.  Due to this, I have placed a palliative care consult.  Elmer Picker, palliative care NP, has come down and assessed the patient.  NP Tamala Julian is advised that this patient could have care completed through palliative care however that would necessitate admission.  I have consulted for admission at this time.  Dr. Wynelle Cleveland, [facility] hospitalist, has  stated that there is no medical condition necessitating admission at this time.  When I went to go and reengage the patient family, they stated that they did not have 24/7 care for the patient.  Due to this, I placed a transitions of care consult.  Gala Murdoch, transition of care, has stated that because of the patient's insurance status she would need inpatient admission for 3 nights before she would qualify for SNF placement.  Seems patient's only option is home health.  [place] is discussing options with the family currently.   Final Clinical Impression(s) / ED Diagnoses Final diagnoses:  Physical deconditioning    Rx / DC Orders ED Discharge Orders     None         Al Decant, PA-C 11/19/22 1903    Loetta Rough, MD 11/19/22 [phone]

## 2022-11-19 NOTE — ED Triage Notes (Signed)
Pt BIB EMS from home for increase in pain during a diaper change. Pt not complaining of pain now   130/88 70HR

## 2022-11-19 NOTE — ED Provider Notes (Signed)
7:59 PM Assumed care of patient from off-going team. For more details, please see note from same day.  In brief, this is a 88 y.o. female with anemia, aortic valve, chronic diastolic heart failure, COPD, gastritis, hypertension who p/w pain. For further info, please see PA note from same day.  Plan/Dispo at time of sign-out & ED Course since sign-out: [ ]  TOC  BP 129/74   Pulse 96   Temp 98.6 F (37 C) (Axillary)   Resp 20   SpO2 96%    ED Course:   Clinical Course as of 11/19/22 1959  Fri Nov 19, 2022  Nov 21, 2022 Per case management, cannot call APS without having a meeting first. Plan is to have meeting. Medicine not admitting. Patient does not have safe discharge plan. Patient will be boarder status.  [HN]  1958 Comfort care meds ordered by palliative care. Home meds ordered. DNR status updated. Patient will be made boarder status and will be followed by TOC.  [HN]    Clinical Course User Index [HN] 4196, MD    Dispo: TBD ------------------------------- Loetta Rough, MD Emergency Medicine  This note was created using dictation software, which may contain spelling or grammatical errors.   Vivi Barrack, MD 11/19/22 [phone]

## 2022-11-19 NOTE — ED Notes (Addendum)
Family at bedside answering questions about pt's care. Family stated that she is normally complains of some aches and pains but seemed to have an increase in pain during her diaper change today

## 2022-11-20 ENCOUNTER — Other Ambulatory Visit: Payer: Self-pay

## 2022-11-20 DIAGNOSIS — Z66 Do not resuscitate: Secondary | ICD-10-CM | POA: Diagnosis not present

## 2022-11-20 DIAGNOSIS — R410 Disorientation, unspecified: Secondary | ICD-10-CM | POA: Diagnosis not present

## 2022-11-20 DIAGNOSIS — R52 Pain, unspecified: Secondary | ICD-10-CM | POA: Diagnosis not present

## 2022-11-20 DIAGNOSIS — R5381 Other malaise: Secondary | ICD-10-CM | POA: Diagnosis not present

## 2022-11-20 DIAGNOSIS — Z789 Other specified health status: Secondary | ICD-10-CM | POA: Diagnosis not present

## 2022-11-20 DIAGNOSIS — R451 Restlessness and agitation: Secondary | ICD-10-CM | POA: Diagnosis not present

## 2022-11-20 DIAGNOSIS — Z515 Encounter for palliative care: Secondary | ICD-10-CM | POA: Diagnosis not present

## 2022-11-20 NOTE — ED Notes (Signed)
Daughter is leaving but wanted me to know that the pt is normally A&O and has a pretty good appetite but takes a while to eat.  She stated that the pt asks for her teeth but mostly plays with them at home instead of putting them in so she has not brought them to the hospital.   She state that the pt loves cold drinks and meat and that she can gum most anything and get it down.    She likes some stimulation so leaving the window open and the tv on is helpful.

## 2022-11-20 NOTE — ED Notes (Signed)
Assisted pt eating dinner. PT ate several bites of meatloaf, 1/2 a potato wedge, maybe 10 carrots and a few bites of ice cream.  Also drank approx. 6 ox of milk and water.   PT is asking for her teeth.  Her daughter stated earlier that at home she just plays with them and doesn't really put them in so she has not brought them to the hospital.

## 2022-11-20 NOTE — ED Notes (Signed)
This RN assisted the pt with eating breakfast. She ate approx. 1/2 of her frenth toast and 1/2 of her oatmeal.  She also drank about 2/3 of her milk.

## 2022-11-20 NOTE — ED Notes (Addendum)
Other than eating breakfast, this pt would ask, "What is all the stuff on me?  When ya'll gonna be done messin with me."  She was a bit upset while being stimulated but when left alone to rest in the room she makes noise and seems to have no complaints. PT's monitor was on comfort care so I removed the pulse ox from her finger at this time because it was bothering her.  I will check VS periodically.   Purewick is in place and pt is clean and dry.

## 2022-11-20 NOTE — Progress Notes (Signed)
Daily Progress Note   Patient Name: Desiree Hutchinson       Date: 11/20/2022 DOB: 1920/09/10  Age: 88 y.o. MRN#: [id number] Attending Physician: Default, Provider, MD Primary Care Physician: Lexine Baton Admit Date: 11/19/2022  Reason for Consultation/Follow-up: Non pain symptom management, Pain control, Psychosocial/spiritual support, and Terminal Care  Subjective: Chart review performed.  Noted patient refusing medications per MAR. Received report from primary RN - no acute concerns.  RN reports patient is calm unless she is "being messed with," then gets agitated.  Only time patient is accepting of interaction is for assistance with feeding.  RN reports there has been no signs of pain or discomfort outside of agitation during care giving.    Went to visit patient at bedside - no family/visitors present.  Patient was lying in bed asleep - I did not attempt to wake her to preserve comfort. No signs or non-verbal gestures of pain or discomfort noted. No respiratory distress, increased work of breathing, or secretions noted.   TOC working on safe discharge plan.  Length of Stay: 0  Current Medications: Scheduled Meds:   acetaminophen (TYLENOL) oral liquid 160 mg/5 mL  650 mg Oral TID   antiseptic oral rinse  15 mL Topical BID   polyethylene glycol  17 g Oral Daily   revefenacin  175 mcg Nebulization Daily    Continuous Infusions:   PRN Meds: acetaminophen (TYLENOL) oral liquid 160 mg/5 mL, albuterol, diphenhydrAMINE, glycopyrrolate **OR** glycopyrrolate **OR** glycopyrrolate, haloperidol **OR** haloperidol **OR** haloperidol lactate, LORazepam **OR** LORazepam **OR** LORazepam, ondansetron **OR** ondansetron (ZOFRAN) IV, oxyCODONE, polyvinyl alcohol  Physical Exam Vitals and  nursing note reviewed.  Constitutional:      General: She is not in acute distress.    Appearance: She is ill-appearing.  Pulmonary:     Effort: No respiratory distress.  Skin:    General: Skin is warm and dry.  Neurological:     Mental Status: She is lethargic, disoriented and confused.     Motor: Weakness present.  Psychiatric:        Cognition and Memory: Cognition is impaired. Memory is impaired.        Judgment: Judgment is impulsive.             Vital Signs: BP (!) 147/103   Pulse 77   Temp  98.6 F (37 C) (Axillary)   Resp 16   SpO2 98%  SpO2: SpO2: 98 % O2 Device: O2 Device: Room Air O2 Flow Rate:    Intake/output summary: No intake or output data in the 24 hours ending 11/20/22 1227 LBM:   Baseline Weight:   Most recent weight:         Palliative Assessment/Data: PPS 20-30%      Patient Active Problem List   Diagnosis Date Noted   Senile dementia (HCC) 02/25/2022   Body mass index less than 19, adult 02/25/2022   Leg weakness, bilateral 02/25/2022   Uses walker 02/25/2022   Encopresis 02/25/2022   Diurnal enuresis 02/25/2022   Breast pain, left 11/24/2021   Protein-calorie malnutrition, severe 01/11/2020   Do not resuscitate 01/09/2020   Asymptomatic menopausal state 06/20/2017   Hypocalcemia 09/30/2016   UTI (urinary tract infection) 09/30/2016   Hematochezia    Leukocytopenia    Thrombocytopenia (HCC)    GI bleed 09/12/2016   Acute GI bleeding 09/12/2016   Hemorrhagic shock (HCC)    PAD (peripheral artery disease) (HCC) 04/06/2016   Iron deficiency anemia 02/13/2016   Urinary incontinence 09/26/2015   Chronic diastolic CHF (congestive heart failure) (HCC) 08/20/2015   SBO (small bowel obstruction) (HCC) 08/06/2015   Acute kidney injury superimposed on chronic kidney disease (HCC) 08/06/2015   Protein-calorie malnutrition (HCC) 07/21/2015   Lower GI bleed 07/19/2015   Diverticulosis of colon with hemorrhage 07/19/2015   Acute blood loss  anemia 07/19/2015   Constipation 01/10/2015   Loss of weight 01/10/2015   Memory loss of 01/10/2015   Hearing loss 01/08/2014   Renal insufficiency 03/27/2013   Encounter for long-term (current) use of other medications 03/27/2013   Aortic valve disorder 06/15/2010   CHRONIC RHINITIS 06/26/2009   SHORTNESS OF BREATH 05/29/2009   OTHER DYSPNEA AND RESPIRATORY ABNORMALITIES 05/29/2009   PRECORDIAL PAIN 05/29/2009   COPD with emphysema Gold Stage A 05/28/2009   SMALL BOWEL OBSTRUCTION, HX OF 05/28/2009   GASTRITIS 03/28/2008   COUGH 12/07/2007   Essential hypertension 10/14/2007   Hypotension 10/14/2007   VENTRICULAR HYPERTROPHY, LEFT 10/14/2007   ALLERGIC RHINITIS 10/14/2007   CHEST PAIN, NON-CARDIAC 10/14/2007   TOBACCO USE, QUIT 10/14/2007    Palliative Care Assessment & Plan   Patient Profile: 88 y.o. female  with past medical history of anemia, aortic valve, chronic diastolic heart failure, COPD, gastritis, and hypertension presented to ED on 11/19/22 from home with family concerns of increased pain.    Assessment: Comfort measures only  Recommendations/Plan: Continue full comfort measures Continue DNR/DNI as previously documented - durable DNR form completed and placed at bedside in ED. Copy was made and will be scanned into Vynca/ACP tab TOC working on safe discharge plan.  Family are agreeable to hospice services at discharge Continue current comfort focused medication regimen - no changes today PMT will continue to follow and support holistically  Symptom Management Scheduled tylenol TID Roxicodone PRN pain/dyspnea/increased work of breathing/RR>25 Tylenol PRN pain/fever Biotin twice daily Benadryl PRN itching Robinul PRN secretions Haldol PRN agitation/delirium Ativan PRN anxiety/seizure/sleep/distress Zofran PRN nausea/vomiting Liquifilm Tears PRN dry eye  Goals of Care and Additional Recommendations: Limitations on Scope of Treatment: Full Comfort  Care  Code Status:    Code Status Orders  (From admission, onward)           Start     Ordered   11/19/22 1737  Do not attempt resuscitation (DNR)  Continuous  Question Answer Comment  In the event of cardiac or respiratory ARREST Do not call a "code blue"   In the event of cardiac or respiratory ARREST Do not perform Intubation, CPR, defibrillation or ACLS   In the event of cardiac or respiratory ARREST Use medication by any route, position, wound care, and other measures to relive pain and suffering. May use oxygen, suction and manual treatment of airway obstruction as needed for comfort.      11/19/22 1744           Code Status History     Date Active Date Inactive Code Status Order ID Comments User Context   01/09/2020 1132 01/13/2020 0008 DNR [id number]  Clydie Braun, MD ED   01/09/2020 1052 01/09/2020 1132 Full Code [id number]  Clydie Braun, MD ED   09/12/2016 [phone] 09/15/2016 2108 DNR [id number]  Jackie Plum, MD Inpatient   08/06/2015 1048 08/22/2015 1703 Full Code [id number]  Leatha Gilding, MD ED   07/19/2015 2149 07/23/2015 2047 Full Code [id number]  Hillary Bow, DO ED      Advance Directive Documentation    Flowsheet Row Most Recent Value  Type of Advance Directive Healthcare Power of Attorney  Pre-existing out of facility DNR order (yellow form or pink MOST form) --  "MOST" Form in Place? --       Prognosis:  < 6 months would not be surprising since goal is for patient not to be rehospitalized - she is at high risk for decline due to advanced age, multiple comorbidities, poor functional status  Discharge Planning: To Be Determined  Care plan was discussed with primary RN  Thank you for allowing the Palliative Medicine Team to assist in the care of this patient.  Haskel Khan, NP  Please contact Palliative Medicine Team phone at [phone] for questions and concerns.   *Portions of this note are a verbal dictation therefore any  spelling and/or grammatical errors are due to the "[employer]agon [employer] One" system interpretation.

## 2022-11-20 NOTE — ED Provider Notes (Signed)
Emergency Medicine Observation Re-evaluation Note  Desiree Hutchinson is a 88 y.o. female, seen on rounds today.  Pt initially presented to the ED for complaints of Leg Pain Currently, the patient is resting in a dark room.  She is confused, asking " when I be going to leave her alone".  Physical Exam  BP (!) 147/103   Pulse 77   Temp 98.6 F (37 C) (Axillary)   Resp 16   SpO2 98%  Physical Exam General: No distress Cardiac: Regular rate Lungs: No respiratory distress Psych: Confused  ED Course / MDM  EKG:   I have reviewed the labs performed to date as well as medications administered while in observation.  Recent changes in the last 24 hours include: No updates today.  Patient is not safe for discharge home.  TOC to arrange a meeting with family on safe discharge.  Plan  Current plan is for patient to remain TOC hold.  She has been eating and drinking well per nursing staff.  Home meds ordered.    Derwood Kaplan, MD 11/20/22 1201

## 2022-11-20 NOTE — ED Notes (Signed)
Daughter is at bedside.  She is feeding the pt and going to wash her up a bit.  The pt is alert and seems pretty cognizant of what is going on currently.  Pt complains of no pain at this time when asked directly.

## 2022-11-20 NOTE — ED Notes (Signed)
Discussed pt plan of care and morning events with Palliative NP.  Will continue to periodically check vitals and give meds scheduled or PRN as tolerated. Suggestion was made to lead with Ativan if she becomes anxious or distressed  in an irreconcilable way.

## 2022-11-21 DIAGNOSIS — Z66 Do not resuscitate: Secondary | ICD-10-CM | POA: Diagnosis not present

## 2022-11-21 DIAGNOSIS — Z515 Encounter for palliative care: Secondary | ICD-10-CM | POA: Diagnosis not present

## 2022-11-21 DIAGNOSIS — R638 Other symptoms and signs concerning food and fluid intake: Secondary | ICD-10-CM | POA: Diagnosis not present

## 2022-11-21 DIAGNOSIS — M79606 Pain in leg, unspecified: Secondary | ICD-10-CM | POA: Diagnosis not present

## 2022-11-21 DIAGNOSIS — R5381 Other malaise: Secondary | ICD-10-CM | POA: Diagnosis not present

## 2022-11-21 DIAGNOSIS — Z789 Other specified health status: Secondary | ICD-10-CM | POA: Diagnosis not present

## 2022-11-21 NOTE — ED Notes (Addendum)
Sleeping. Arousable to entering room. Alert, NAD, calm, interactive, resps e/u, speaking in clear complete statements. Denies pain or nausea, but verbalizes "just don't feel well". Unable to clarify. HOH. TV on. Denies questions or needs, sx or complaints.

## 2022-11-21 NOTE — ED Provider Notes (Signed)
Emergency Medicine Observation Re-evaluation Note  Desiree Hutchinson is a 88 y.o. female, seen on rounds today.  Pt initially presented to the ED for complaints of Leg Pain Currently, the patient is resting comfortably.  Physical Exam  BP (!) 160/95 (BP Location: Left Arm)   Pulse 83   Temp 98.5 F (36.9 C) (Oral)   Resp 18   Ht 5' (1.524 m)   Wt 44 kg   SpO2 94%   BMI 18.94 kg/m  Physical Exam General: no distress Cardiac: regular rate Lungs: no resp distress Psych: calm  ED Course / MDM  EKG:   I have reviewed the labs performed to date as well as medications administered while in observation.  Recent changes in the last 24 hours include didn't eat breakfast, but ate dinner last night. Still pending family meeting with TOC to evaluate for safe discharge plan.  Plan  Current plan is for [facility] family meeting to discuss discharge plan. Patient DNR and comfort care with palliative care following.    Lonell Grandchild, MD 11/21/22 1213

## 2022-11-21 NOTE — ED Notes (Signed)
Palliative at [facility] speaking with family.

## 2022-11-21 NOTE — ED Notes (Signed)
Family arrives at [facility]

## 2022-11-21 NOTE — ED Notes (Signed)
Pain med given. Family at [facility]. Private/personal nail person at [facility] for nail/hand care.

## 2022-11-21 NOTE — ED Notes (Signed)
No obvious s/sx of pain. Declines food and drink.

## 2022-11-21 NOTE — ED Notes (Signed)
Palliative at [facility] to see.

## 2022-11-21 NOTE — Progress Notes (Signed)
Daily Progress Note   Patient Name: Desiree Hutchinson       Date: 11/21/2022 DOB: [birth year]  Age: 88 y.o. MRN#: [id number] Attending Physician: Default, Provider, MD Primary Care Physician: Marcellina Millin Admit Date: 11/19/2022  Reason for Consultation/Follow-up: Non pain symptom management, Pain control, Psychosocial/spiritual support, and Terminal Care  Subjective: Medical records reviewed. Patient assessed at the bedside.  She is sleeping comfortably.  Discussed with RN.  No family present during my visit.  Return to the bedside after receiving update from RN that patient's family had arrived.  Met with granddaughter Evette who requested to discuss patient's prognosis.  I shared that hospice continues to be appropriate if aligned with goals of care and she initially states " I do not want hospice, that is not the mindset I am in."  I explored her thoughts on what hospice is and she states she has no prior experience with this or any understanding of what hospice looks like.  She states "all I know is that she needs to go to a nursing home."  I then reviewed hospice philosophy and services available in different settings with emphasis on comfort, peace and dignity.  She agrees this is what she wants to focus on for the patient and that she does not want any further hospitalizations.  I reviewed the differences between SNF for short-term rehab and long-term care.  She agrees that patient would not tolerate rehab and this would be of no benefit.  We discussed that long-term care is typically quite expensive.  She has questions regarding what "emergent Medicaid" means and how she should go about selling patient's home to pay for caregivers if she cannot find placement in a nursing home for  long-term care.  She anticipates a meeting tomorrow will be set up with TOC to review this further.  I explained to the best of my ability that Medicaid applications typically take 90 days for processing at a minimum and that this is likely a way to expedite the process for patient's in situations like [place].  Patient's granddaughter explains why her brother is no longer able to assist.  Emotional support and therapeutic listening was provided.  She would like patient to receive a dose of Tylenol as she anticipates her grooming assistance will be arriving soon.  Discussed with RN.  Questions and concerns addressed. PMT will continue to support holistically.  Length of Stay: 0  Physical Exam Vitals and nursing note reviewed.  Constitutional:      General: She is not in acute distress.    Appearance: She is ill-appearing.  Pulmonary:     Effort: No respiratory distress.  Skin:    General: Skin is warm and dry.  Neurological:     Mental Status: She is lethargic, disoriented and confused.     Motor: Weakness present.  Psychiatric:        Cognition and Memory: Cognition is impaired. Memory is impaired.        Judgment: Judgment is impulsive.                Palliative Assessment/Data: PPS 20-30%    Palliative Care Assessment & Plan   Patient Profile: 88 y.o. female  with past medical history of anemia, aortic valve, chronic diastolic heart failure, COPD, gastritis, and hypertension presented to ED on 11/19/22 from home with family concerns of increased pain.    Assessment: End-of-life care  Recommendations/Plan: Continue DNR Continue comfort care, no adjustments to medications today Goal remains clear for hospice philosophy at discharge and no further hospitalizations Patient's granddaughter will meet with [facility] tomorrow to further discuss disposition as she likely needs long-term care with hospice Psychosocial and emotional support provided PMT will continue to follow and  support  Prognosis:  < 6 months would not be surprising since goal is for patient not to be rehospitalized - she is at high risk for decline due to advanced age, multiple comorbidities, poor functional status  Discharge Planning: To Be Determined  Care plan was discussed with primary RN, patient's granddaughter   Total time: I spent 50 minutes in the care of the patient today in the above activities and documenting the encounter.  MDM: High   Johnell Comings Palliative Medicine Team Team phone # [phone]  Thank you for allowing the Palliative Medicine Team to assist in the care of this patient. Please utilize secure chat with additional questions, if there is no response within 30 minutes please call the above phone number.  Palliative Medicine Team providers are available by phone from 7am to 7pm daily and can be reached through the team cell phone.  Should this patient require assistance outside of these hours, please call the patient's attending physician.

## 2022-11-22 DIAGNOSIS — Z66 Do not resuscitate: Secondary | ICD-10-CM | POA: Diagnosis not present

## 2022-11-22 DIAGNOSIS — R638 Other symptoms and signs concerning food and fluid intake: Secondary | ICD-10-CM | POA: Diagnosis not present

## 2022-11-22 DIAGNOSIS — M79606 Pain in leg, unspecified: Secondary | ICD-10-CM | POA: Diagnosis not present

## 2022-11-22 DIAGNOSIS — Z789 Other specified health status: Secondary | ICD-10-CM | POA: Diagnosis not present

## 2022-11-22 DIAGNOSIS — Z515 Encounter for palliative care: Secondary | ICD-10-CM | POA: Diagnosis not present

## 2022-11-22 DIAGNOSIS — R5381 Other malaise: Secondary | ICD-10-CM | POA: Diagnosis not present

## 2022-11-22 NOTE — Progress Notes (Signed)
Daily Progress Note   Patient Name: NEOSHA SWITALSKI       Date: 11/22/2022 DOB: February 23, 1920  Age: 88 y.o. MRN#: [id number] Attending Physician: Default, Provider, MD Primary Care Physician: Lexine Baton Admit Date: 11/19/2022  Reason for Consultation/Follow-up: Non pain symptom management, Pain control, Psychosocial/spiritual support, and Terminal Care  Subjective: Medical records reviewed. Patient assessed at the bedside. She is sleeping comfortably.  Did not attempt to arouse.  No family present during my visit.  Received message from patient's granddaughter Evette requesting a return call.  She states that she has not yet heard from anyone about a meeting for discussion of disposition today.  I clarified with her that it appears she has already spoken with TOC CM Edwin Dada.  She confirms this conversation occurred and admits that she is having difficulty with all of the information she is processing, as she has never been in this situation before.  She is very interested in assistance with selling patient's home if necessary.  She has already applied for Medicaid today.  Emotional support and therapeutic listening was provided.  Explored her thoughts on hospice support, as she seems to have different feelings on this at different times.  She states that she is not ready for patient to die and she was doing fine other than her diaper rash.  Provided clarification that hospice would be able to manage comfort focused care at any facility, just as we are doing in the hospital.  Counseled that this does not expedite the dying process but rather ensures peace, dignity and relief from suffering as the natural disease process is allowed to continue without intervening to prolong her life.  She  confirms this is the type of care she would like patient to receive.  Also clarified that patient can have both hospice care and nursing home support.  She states this is still confusing but she will continue to reflect and inform PMT or TOC if interested.  Questions and concerns addressed. PMT will continue to support holistically.  Length of Stay: 0  Physical Exam Vitals and nursing note reviewed.  Constitutional:      General: She is sleeping. She is not in acute distress.    Appearance: She is ill-appearing.  Pulmonary:     Effort: No respiratory distress.  Neurological:  Mental Status: She is confused.  Psychiatric:        Cognition and Memory: Cognition is impaired. Memory is impaired.        Judgment: Judgment is impulsive.                Palliative Assessment/Data: PPS 20-30%   Palliative Care Assessment & Plan   Patient Profile: 88 y.o. female  with past medical history of anemia, aortic valve, chronic diastolic heart failure, COPD, gastritis, and hypertension presented to ED on 11/19/22 from home with family concerns of increased pain.    Assessment: End-of-life care  Recommendations/Plan: Continue DNR Continue comfort care, no adjustments to medications today Patient's granddaughter is hesitant about hospice support upon discharge, having a difficult time processing a lot of information.  Will continue to discuss.  She remains interested in long-term care placement and unable to care for her at home PMT will continue to follow and support  Prognosis:  < 6 months would not be surprising since goal is for patient not to be rehospitalized - she is at high risk for decline due to advanced age, multiple comorbidities, poor functional status  Discharge Planning: To Be Determined  Care plan was discussed with patient's granddaughter   Total time: I spent 35 minutes in the care of the patient today in the above activities and documenting the  encounter.    Richardson Dopp, PA-C Palliative Medicine Team Team phone # [phone]  Thank you for allowing the Palliative Medicine Team to assist in the care of this patient. Please utilize secure chat with additional questions, if there is no response within 30 minutes please call the above phone number.  Palliative Medicine Team providers are available by phone from 7am to 7pm daily and can be reached through the team cell phone.  Should this patient require assistance outside of these hours, please call the patient's attending physician.

## 2022-11-22 NOTE — ED Notes (Signed)
Shift report received, assumed care of patient at this time 

## 2022-11-22 NOTE — Progress Notes (Addendum)
1:50pm: CSW spoke with patient's granddaughter [name] again to inform her of updates.  11:55am: CSW spoke with Lowella Bandy at [facility] to discuss patient. Lowella Bandy will discuss with administration to determine if the facility can offer a bed.  CSW spoke with Kia at [facility] who states the facility cannot the patient due to the facility not being under the [facility] ACO waiver.  CSW spoke with Almira Coaster of [facility] who states there are no exceptions to the rule of the waiver only being used at authorized facilities.  Edwin Dada, MSW, LCSW Transitions of Care  Clinical Social Worker II [phone]

## 2022-11-22 NOTE — TOC Progression Note (Addendum)
Transition of Care [facility]) - Progression Note    Patient Details  Name: Desiree Hutchinson MRN: [id number] Date of Birth: 1920-10-14  Transition of Care [facility] CM/SW Contact  Inis Sizer, LCSW Phone Number: 11/22/2022, 9:56 AM  Clinical Narrative:    CSW spoke with patient's granddaughter Evette to discuss discharge planning. Evette states she and her brother are the patient's primary caregivers. Evette states her brother is no longer able to care for the patient as he is experiencing some mental health concerns. Evette states the patient does not own her current residence and does not have additional assets so she applied for Medicaid this morning with a DSS worker via phone. Evette states she is not interested in hospice services at this time. Evette states the patient is forgetful at times but is otherwise independent with ADL's, Evette states the patient is able to ambulate with her walker. [name] is interested in long term care in a local facility. Evette is not agreeable to home health services due to lack of supervision. Evette reports she is employed full time and cannot quit her job to care for the patient. Evette reports she is the patient's HCPOA. CSW and Evette discussed the possiblity of obtaining private duty nursing services however due to cost that is not an option at this time.  CSW will speak with admissions staff at facilities to determine if any bed offers can be obtained.  Patient does qualify for the [facility] ACO REACH waiver.    Barriers to Discharge: No Barriers Identified  Expected Discharge Plan and Services      Social Determinants of Health (SDOH) Interventions    Readmission Risk Interventions     No data to display

## 2022-11-22 NOTE — NC FL2 (Signed)
[place] MEDICAID FL2 LEVEL OF CARE SCREENING TOOL     IDENTIFICATION  Patient Name: Desiree Hutchinson Birthdate: 1920/03/14 Sex: female Admission Date (Current Location): 11/19/2022  [facility] and [place] Number:  Producer, television/film/video and Address:         Provider Number: [id number]  Attending Physician Name and Address:  Default, Provider, MD  Relative Name and Phone Number:       Current Level of Care: Hospital Recommended Level of Care: Skilled Nursing Facility Prior Approval Number:    Date Approved/Denied:   PASRR Number: [id number]  Discharge Plan: SNF    Current Diagnoses: Patient Active Problem List   Diagnosis Date Noted   Senile dementia (HCC) 02/25/2022   Body mass index less than 19, adult 02/25/2022   Leg weakness, bilateral 02/25/2022   Uses walker 02/25/2022   Encopresis 02/25/2022   Diurnal enuresis 02/25/2022   Breast pain, left 11/24/2021   Protein-calorie malnutrition, severe 01/11/2020   Do not resuscitate 01/09/2020   Asymptomatic menopausal state 06/20/2017   Hypocalcemia 09/30/2016   UTI (urinary tract infection) 09/30/2016   Hematochezia    Leukocytopenia    Thrombocytopenia (HCC)    GI bleed 09/12/2016   Acute GI bleeding 09/12/2016   Hemorrhagic shock (HCC)    PAD (peripheral artery disease) (HCC) 04/06/2016   Iron deficiency anemia 02/13/2016   Urinary incontinence 09/26/2015   Chronic diastolic CHF (congestive heart failure) (HCC) 08/20/2015   SBO (small bowel obstruction) (HCC) 08/06/2015   Acute kidney injury superimposed on chronic kidney disease (HCC) 08/06/2015   Protein-calorie malnutrition (HCC) 07/21/2015   Lower GI bleed 07/19/2015   Diverticulosis of colon with hemorrhage 07/19/2015   Acute blood loss anemia 07/19/2015   Constipation 01/10/2015   Loss of weight 01/10/2015   Memory loss of 01/10/2015   Hearing loss 01/08/2014   Renal insufficiency 03/27/2013   Encounter for long-term (current) use of other  medications 03/27/2013   Aortic valve disorder 06/15/2010   CHRONIC RHINITIS 06/26/2009   SHORTNESS OF BREATH 05/29/2009   OTHER DYSPNEA AND RESPIRATORY ABNORMALITIES 05/29/2009   PRECORDIAL PAIN 05/29/2009   COPD with emphysema Gold Stage A 05/28/2009   SMALL BOWEL OBSTRUCTION, HX OF 05/28/2009   GASTRITIS 03/28/2008   COUGH 12/07/2007   Essential hypertension 10/14/2007   Hypotension 10/14/2007   VENTRICULAR HYPERTROPHY, LEFT 10/14/2007   ALLERGIC RHINITIS 10/14/2007   CHEST PAIN, NON-CARDIAC 10/14/2007   TOBACCO USE, QUIT 10/14/2007    Orientation RESPIRATION BLADDER Height & Weight     Self, Time, Situation, Place  Normal Continent Weight: 97 lb (44 kg) Height:  5' (152.4 cm)  BEHAVIORAL SYMPTOMS/MOOD NEUROLOGICAL BOWEL NUTRITION STATUS      Continent Diet (See discharge summary)  AMBULATORY STATUS COMMUNICATION OF NEEDS Skin   Supervision Verbally Normal                       Personal Care Assistance Level of Assistance  Bathing, Feeding, Dressing Bathing Assistance: Limited assistance Feeding assistance: Limited assistance Dressing Assistance: Limited assistance     Functional Limitations Info  Sight, Hearing, Speech Sight Info: Adequate Hearing Info: Adequate Speech Info: Adequate    SPECIAL CARE FACTORS FREQUENCY  PT (By licensed PT), OT (By licensed OT)     PT Frequency: 5xweek OT Frequency: 5xweek            Contractures Contractures Info: Not present    Additional Factors Info  Code Status Code Status Info: DNR  Current Medications (11/22/2022):  This is the current hospital active medication list Current Facility-Administered Medications  Medication Dose Route Frequency Provider Last Rate Last Admin   acetaminophen (TYLENOL) 160 MG/5ML solution 650 mg  650 mg Oral TID Haskel Khan, NP   650 mg at 11/19/22 1800   acetaminophen (TYLENOL) 160 MG/5ML solution 650 mg  650 mg Oral Q6H PRN Haskel Khan, NP   650 mg at  11/21/22 1424   albuterol (PROVENTIL) (2.5 MG/3ML) 0.083% nebulizer solution 2.5 mg  2.5 mg Nebulization Q6H PRN Loetta Rough, MD       antiseptic oral rinse (BIOTENE) solution 15 mL  15 mL Topical BID Haskel Khan, NP       diphenhydrAMINE (BENADRYL) injection 12.5 mg  12.5 mg Intravenous Q4H PRN Haskel Khan, NP       glycopyrrolate (ROBINUL) tablet 1 mg  1 mg Oral Q4H PRN Haskel Khan, NP       Or   glycopyrrolate (ROBINUL) injection 0.2 mg  0.2 mg Subcutaneous Q4H PRN Haskel Khan, NP       Or   glycopyrrolate (ROBINUL) injection 0.2 mg  0.2 mg Intravenous Q4H PRN Haskel Khan, NP       haloperidol (HALDOL) tablet 2 mg  2 mg Oral Q6H PRN Haskel Khan, NP       Or   haloperidol (HALDOL) 2 MG/ML solution 2 mg  2 mg Sublingual Q6H PRN Haskel Khan, NP       Or   haloperidol lactate (HALDOL) injection 2 mg  2 mg Intravenous Q6H PRN Haskel Khan, NP       LORazepam (ATIVAN) tablet 1 mg  1 mg Oral Q1H PRN Haskel Khan, NP       Or   LORazepam (ATIVAN) 2 MG/ML concentrated solution 1 mg  1 mg Sublingual Q1H PRN Haskel Khan, NP       Or   LORazepam (ATIVAN) injection 1 mg  1 mg Intravenous Q1H PRN Haskel Khan, NP       ondansetron (ZOFRAN-ODT) disintegrating tablet 4 mg  4 mg Oral Q6H PRN Haskel Khan, NP       Or   ondansetron [facility]) injection 4 mg  4 mg Intravenous Q6H PRN Haskel Khan, NP       oxyCODONE (Oxy IR/ROXICODONE) immediate release tablet 5 mg  5 mg Oral Q2H PRN Haskel Khan, NP   5 mg at 11/21/22 1424   polyethylene glycol (MIRALAX / GLYCOLAX) packet 17 g  17 g Oral Daily Loetta Rough, MD       polyvinyl alcohol (LIQUIFILM TEARS) 1.4 % ophthalmic solution 1 drop  1 drop Both Eyes QID PRN Haskel Khan, NP       revefenacin (YUPELRI) nebulizer solution 175 mcg  175 mcg Nebulization Daily Loetta Rough, MD       Current Outpatient Medications  Medication Sig Dispense Refill   acetaminophen (TYLENOL) 500 MG tablet May increase to 1000 mg (2  tablets) three times daily if needed for pain 90 tablet 1   albuterol (PROVENTIL) (2.5 MG/3ML) 0.083% nebulizer solution Take 3 mLs (2.5 mg total) by nebulization every 6 (six) hours as needed for wheezing or shortness of breath. 75 mL 5   cholecalciferol (VITAMIN D) 400 units TABS tablet Take 400 Units by mouth.     polyethylene glycol powder (GLYCOLAX/MIRALAX) 17 GM/SCOOP powder Take 17 g by mouth daily.  As needed for constipation 500 g 2   YUPELRI 175 MCG/3ML nebulizer solution NEBULIZE CONTENTS OF 1 VIAL 1 TIME A DAY 90 mL 5     Discharge Medications: Please see discharge summary for a list of discharge medications.  Relevant Imaging Results:  Relevant Lab Results:   Additional Information SS number [id number]  Oletta Lamas, MSW, LCSWA, LCASA Transitions of Care  Clinical Social Worker I

## 2022-11-22 NOTE — ED Provider Notes (Signed)
Emergency Medicine Observation Re-evaluation Note  Desiree Hutchinson is a 88 y.o. female, seen on rounds today.  Pt initially presented to the ED for complaints of Leg Pain Currently, the patient is resting.  Physical Exam  BP (!) 158/68   Pulse 64   Temp 98.7 F (37.1 C) (Axillary)   Resp 16   Ht 5' (1.524 m)   Wt 44 kg   SpO2 99%   BMI 18.94 kg/m  Physical Exam General: NAD Cardiac: well perfused Lungs: even and unlabored Psych: deferred  ED Course / MDM  EKG:   I have reviewed the labs performed to date as well as medications administered while in observation.  Recent changes in the last 24 hours include   Seen by palliative care yesterday (11/26): Recommendations/Plan: Continue DNR Continue comfort care, no adjustments to medications today Goal remains clear for hospice philosophy at discharge and no further hospitalizations Patient's granddaughter will meet with [facility] tomorrow to further discuss disposition as she likely needs long-term care with hospice Psychosocial and emotional support provided PMT will continue to follow and support.  Plan  Current plan is for [facility] for dispo planning.    Ernie Avena, MD 11/22/22 1002

## 2022-11-23 DIAGNOSIS — R638 Other symptoms and signs concerning food and fluid intake: Secondary | ICD-10-CM | POA: Diagnosis not present

## 2022-11-23 DIAGNOSIS — Z515 Encounter for palliative care: Secondary | ICD-10-CM | POA: Diagnosis not present

## 2022-11-23 DIAGNOSIS — Z66 Do not resuscitate: Secondary | ICD-10-CM | POA: Diagnosis not present

## 2022-11-23 DIAGNOSIS — Z789 Other specified health status: Secondary | ICD-10-CM | POA: Diagnosis not present

## 2022-11-23 DIAGNOSIS — R5381 Other malaise: Secondary | ICD-10-CM | POA: Diagnosis not present

## 2022-11-23 DIAGNOSIS — Z7189 Other specified counseling: Secondary | ICD-10-CM | POA: Diagnosis not present

## 2022-11-23 MED ORDER — POLYETHYLENE GLYCOL 3350 17 G PO PACK: 17.0000 g | PACK | Freq: Every day | ORAL | Status: DC | PRN

## 2022-11-23 NOTE — ED Notes (Signed)
Patient was able to eat some applesauce, 1/4 of a [place] sandwich and some apple juice. Patient is resting in bed.

## 2022-11-23 NOTE — ED Notes (Signed)
Pt resting quietly in bed, watching TV. No S/S of distress noted. Pt denies needs or requests at this time.

## 2022-11-23 NOTE — ED Provider Notes (Signed)
Emergency Medicine Observation Re-evaluation Note  Desiree Hutchinson is a 88 y.o. female, seen on rounds today.  Pt initially presented to the ED for complaints of Leg Pain Currently, the patient is sleeping.  Physical Exam  BP (!) 171/67   Pulse 72   Temp 97.8 F (36.6 C) (Axillary)   Resp 15   Ht 5' (1.524 m)   Wt 44 kg   SpO2 97%   BMI 18.94 kg/m  Physical Exam General: Sleeping Cardiac: Extremities perfused Lungs: Unlabored breathing Psych: Deferred  ED Course / MDM  EKG:   I have reviewed the labs performed to date as well as medications administered while in observation.  Recent changes in the last 24 hours include none.  Plan  Current plan is for placement.    Gloris Manchester, MD 11/23/22 [id number]

## 2022-11-23 NOTE — ED Notes (Signed)
Pt cleaned up, linen changed. Brief changed. Tolerated well.

## 2022-11-23 NOTE — Progress Notes (Signed)
Daily Progress Note   Patient Name: Desiree Hutchinson       Date: 11/23/2022 DOB: [birth year]  Age: 88 y.o. MRN#: [id number] Attending Physician: Default, Provider, MD Primary Care Physician: Lexine Baton Admit Date: 11/19/2022  Reason for Consultation/Follow-up: Non pain symptom management, Pain control, Psychosocial/spiritual support, and Terminal Care  Subjective: Chart review performed. Noted patient refusing all medications. Received report from primary RN - no acute concerns. RN reports patient with decreased appetite today.   Went to visit patient at bedside - granddaughter/Evette and Reginald/Evette's significant other present. Patient was lying in bed awake, does not engage in conversation. No signs or non-verbal gestures of pain or discomfort noted. No respiratory distress, increased work of breathing, or secretions noted.   Emotional support provided to family. They are hopeful facility can be found for patient that provides 24/7 support - TOC working with family. Family confirm patient's oral intake has declined. Natural trajectory at EOL reviewed; could also be mood changes from long stay in ED with no windows. Encouraged family to offer food as she tolerates/accepts.   All questions and concerns addressed. Encouraged to call with questions and/or concerns. PMT card previously provided.  Discussed case with TOC.  Length of Stay: 0  Current Medications: Scheduled Meds:   acetaminophen (TYLENOL) oral liquid 160 mg/5 mL  650 mg Oral TID   antiseptic oral rinse  15 mL Topical BID   polyethylene glycol  17 g Oral Daily   revefenacin  175 mcg Nebulization Daily    Continuous Infusions:   PRN Meds: acetaminophen (TYLENOL) oral liquid 160 mg/5 mL, albuterol,  diphenhydrAMINE, glycopyrrolate **OR** glycopyrrolate **OR** glycopyrrolate, haloperidol **OR** haloperidol **OR** haloperidol lactate, LORazepam **OR** LORazepam **OR** LORazepam, ondansetron **OR** ondansetron (ZOFRAN) IV, oxyCODONE, polyvinyl alcohol  Physical Exam Vitals and nursing note reviewed.  Constitutional:      General: She is not in acute distress.    Appearance: She is ill-appearing.  Pulmonary:     Effort: No respiratory distress.  Skin:    General: Skin is warm and dry.  Neurological:     Mental Status: She is alert.     Motor: Weakness present.  Psychiatric:        Speech: She is noncommunicative.        Cognition and Memory: Cognition is impaired. Memory is impaired.  Vital Signs: BP (!) 171/67   Pulse 72   Temp 97.8 F (36.6 C) (Axillary)   Resp 15   Ht 5' (1.524 m)   Wt 44 kg   SpO2 97%   BMI 18.94 kg/m  SpO2: SpO2: 97 % O2 Device: O2 Device: Room Air O2 Flow Rate:    Intake/output summary: No intake or output data in the 24 hours ending 11/23/22 1419 LBM:   Baseline Weight: Weight: 44 kg Most recent weight: Weight: 44 kg       Palliative Assessment/Data: PPS 20%      Patient Active Problem List   Diagnosis Date Noted   Senile dementia (HCC) 02/25/2022   Body mass index less than 19, adult 02/25/2022   Leg weakness, bilateral 02/25/2022   Uses walker 02/25/2022   Encopresis 02/25/2022   Diurnal enuresis 02/25/2022   Breast pain, left 11/24/2021   Protein-calorie malnutrition, severe 01/11/2020   Do not resuscitate 01/09/2020   Asymptomatic menopausal state 06/20/2017   Hypocalcemia 09/30/2016   UTI (urinary tract infection) 09/30/2016   Hematochezia    Leukocytopenia    Thrombocytopenia (HCC)    GI bleed 09/12/2016   Acute GI bleeding 09/12/2016   Hemorrhagic shock (HCC)    PAD (peripheral artery disease) (HCC) 04/06/2016   Iron deficiency anemia 02/13/2016   Urinary incontinence 09/26/2015   Chronic diastolic CHF  (congestive heart failure) (HCC) 08/20/2015   SBO (small bowel obstruction) (HCC) 08/06/2015   Acute kidney injury superimposed on chronic kidney disease (HCC) 08/06/2015   Protein-calorie malnutrition (HCC) 07/21/2015   Lower GI bleed 07/19/2015   Diverticulosis of colon with hemorrhage 07/19/2015   Acute blood loss anemia 07/19/2015   Constipation 01/10/2015   Loss of weight 01/10/2015   Memory loss of 01/10/2015   Hearing loss 01/08/2014   Renal insufficiency 03/27/2013   Encounter for long-term (current) use of other medications 03/27/2013   Aortic valve disorder 06/15/2010   CHRONIC RHINITIS 06/26/2009   SHORTNESS OF BREATH 05/29/2009   OTHER DYSPNEA AND RESPIRATORY ABNORMALITIES 05/29/2009   PRECORDIAL PAIN 05/29/2009   COPD with emphysema Gold Stage A 05/28/2009   SMALL BOWEL OBSTRUCTION, HX OF 05/28/2009   GASTRITIS 03/28/2008   COUGH 12/07/2007   Essential hypertension 10/14/2007   Hypotension 10/14/2007   VENTRICULAR HYPERTROPHY, LEFT 10/14/2007   ALLERGIC RHINITIS 10/14/2007   CHEST PAIN, NON-CARDIAC 10/14/2007   TOBACCO USE, QUIT 10/14/2007    Palliative Care Assessment & Plan   Patient Profile: 88 y.o. female  with past medical history of anemia, aortic valve, chronic diastolic heart failure, COPD, gastritis, and hypertension presented to ED on 11/19/22 from home with family concerns of increased pain.     Assessment: End of life care   Recommendations/Plan: Continue full comfort measures Continue DNR/DNI as previously documented Recommend admission for end of life care. Per TOC would need admission for three midnights to assist in getting patient to facility with hospice Comfort medications adjusted as below PMT will continue to follow and support holistically  Symptom Management Patient refusing medications - discontinued scheduled tylenol, miralax changed to daily PRN Roxicodone PRN pain/dyspnea/increased work of breathing/RR>25 Tylenol PRN  pain/fever Biotin twice daily Benadryl PRN itching Robinul PRN secretions Haldol PRN agitation/delirium Ativan PRN anxiety/seizure/sleep/distress Zofran PRN nausea/vomiting Liquifilm Tears PRN dry eye  Goals of Care and Additional Recommendations: Limitations on Scope of Treatment: Full Comfort Care  Code Status:    Code Status Orders  (From admission, onward)  Start     Ordered   11/19/22 1737  Do not attempt resuscitation (DNR)  Continuous       Question Answer Comment  In the event of cardiac or respiratory ARREST Do not call a "code blue"   In the event of cardiac or respiratory ARREST Do not perform Intubation, CPR, defibrillation or ACLS   In the event of cardiac or respiratory ARREST Use medication by any route, position, wound care, and other measures to relive pain and suffering. May use oxygen, suction and manual treatment of airway obstruction as needed for comfort.      11/19/22 1744           Code Status History     Date Active Date Inactive Code Status Order ID Comments User Context   01/09/2020 1132 01/13/2020 0008 DNR [id number]  Clydie Braun, MD ED   01/09/2020 1052 01/09/2020 1132 Full Code [id number]  Clydie Braun, MD ED   09/12/2016 [phone] 09/15/2016 2108 DNR [id number]  Jackie Plum, MD Inpatient   08/06/2015 1048 08/22/2015 1703 Full Code [id number]  Leatha Gilding, MD ED   07/19/2015 2149 07/23/2015 2047 Full Code [id number]  Hillary Bow, DO ED      Advance Directive Documentation    Flowsheet Row Most Recent Value  Type of Advance Directive Healthcare Power of Attorney  Pre-existing out of facility DNR order (yellow form or pink MOST form) --  "MOST" Form in Place? --       Prognosis:  < 6 months  Discharge Planning: To Be Determined  Care plan was discussed with primary RN  Thank you for allowing the Palliative Medicine Team to assist in the care of this patient.   Haskel Khan, NP  Please contact Palliative  Medicine Team phone at [phone] for questions and concerns.   *Portions of this note are a verbal dictation therefore any spelling and/or grammatical errors are due to the "Dragon Medical One" system interpretation.

## 2022-11-23 NOTE — Progress Notes (Signed)
Patient OZ:[id number] S Branham      DOB: April 24, 1920      QIO:[id number]      Palliative Medicine Team    Subjective: Bedside symptom check completed. No family or visitors bedside at this time.    Physical exam: Patient resting in bed with eyes closed at time of visit. Breathing even and non-labored, no excessive secretions noted. Patient without physical or non-verbal signs of pain or discomfort at this time. Patient sleeping soundly, did not wake with this RN's entrance and introduction, this RN did not attempt to further stimulate or wake patient.    Assessment and plan: Will continue to follow for any changes or advances. TOC working on disposition, this RN available for assistance as appropriate.    Thank you for allowing the Palliative Medicine Team to assist in the care of this patient.     Shelda Jakes, MSN, RN4 Palliative Medicine Team Team Phone: [phone]  This phone is monitored 7a-7p, please reach out to attending physician outside of these hours for urgent needs.

## 2022-11-23 NOTE — Progress Notes (Addendum)
2:20pm: CSW spoke with patient's granddaughter Evette to inform her of updates.  9am: CSW spoke with Lowella Bandy at [facility] who states the facility cannot accept the patient.  CSW sent patient's clinicals out to additional facilities for review.  CSW spoke with Lynnea Ferrier at [facility] who states there are no beds available at this time.  Edwin Dada, MSW, LCSW Transitions of Care  Clinical Social Worker II [phone]

## 2022-11-24 ENCOUNTER — Encounter (HOSPITAL_COMMUNITY): Payer: Self-pay

## 2022-11-24 DIAGNOSIS — R638 Other symptoms and signs concerning food and fluid intake: Secondary | ICD-10-CM | POA: Diagnosis not present

## 2022-11-24 DIAGNOSIS — Z515 Encounter for palliative care: Secondary | ICD-10-CM | POA: Diagnosis not present

## 2022-11-24 DIAGNOSIS — R5381 Other malaise: Secondary | ICD-10-CM | POA: Diagnosis not present

## 2022-11-24 DIAGNOSIS — Z66 Do not resuscitate: Secondary | ICD-10-CM | POA: Diagnosis not present

## 2022-11-24 DIAGNOSIS — M79606 Pain in leg, unspecified: Secondary | ICD-10-CM | POA: Diagnosis not present

## 2022-11-24 DIAGNOSIS — Z789 Other specified health status: Secondary | ICD-10-CM | POA: Diagnosis not present

## 2022-11-24 NOTE — ED Notes (Signed)
Patient appears to be resting comfortably at this time.

## 2022-11-24 NOTE — ED Notes (Signed)
This RN had Long conversation with granddaughter regarding her wanting the pt to be admitted for 3 days so the pt could then go to SNF for 21 days while she figured out something further to do for at home.  I offered assistance for further help with advising her but she stated she does have an appt with an attorney today.

## 2022-11-24 NOTE — ED Notes (Signed)
RN attempted to give revefenacin neb tx scheduled. Pt requested mask to be removed and off her face.

## 2022-11-24 NOTE — Progress Notes (Addendum)
TOC CSW made a referral to [facility] for [facility].  [name] is the consultant who will receive the referral.  Pts granddaughter, [name] has been notified of the referral.  Desiree Hutchinson, MSW, LCSW-A Pronouns:  She/Her/Hers [facility] of Care Clinical Social Worker Direct Number:  [phone] [email or web address]

## 2022-11-24 NOTE — Progress Notes (Addendum)
11:15am: CSW spoke with [facility] supervisor who states that the only suggestions that can be made to assist in the selling of the patient home is the following: Advise patient's granddaughter [name] to obtain a lawyer to complete financial power of attorney paperwork once discharged from the hospital. Obtain a realtor once appropriate documentation is obtained allowing Evette to act on the patient's behalf.  CSW returned call to [facility] to provide her with information. Evette states that she does not believe it is safe for the patient to return home without constant supervision.  This information was relayed to palliative medicine care team members and Dr. Denton Lank. There is a planned meeting for today at 2:30pm with PMT.  10:30am: CSW spoke with [facility] supervisor to obtain guidance on how to proceed with discharge planning.  CSW spoke with patient's granddaughter [name] to present her with the following options: Pay privately for respite care in a facility until private duty caregivers can be obtained for supervision in the home. Pay privately for room and board at a SNF with hospice services. Pay privately for private duty care givers in the home. Evette states she has access to the patient's funds but states the available amount would not be enough to pay privately for facility placement. Evette states the patient's current residence was gifted to her by her mother (patient's daughter) who is now deceased. Evette states she is is interested in obtaining information on how to sell the home to obtain necessary funding.  CSW will have additional conversation with [facility] leadership.  Edwin Dada, MSW, LCSW Transitions of Care  Clinical Social Worker II [phone]

## 2022-11-24 NOTE — Progress Notes (Signed)
Daily Progress Note   Patient Name: Desiree Hutchinson       Date: 11/24/2022 DOB: Mar 26, 1920  Age: 88 y.o. MRN#: [id number] Attending Physician: Default, Provider, MD Primary Care Physician: Marcellina Millin Admit Date: 11/19/2022  Reason for Consultation/Follow-up: Non pain symptom management, Pain control, Psychosocial/spiritual support, and Terminal Care  Subjective: Chart review performed. Discussed with TOC - patient's family continues to have concerns about patient safety at home without 24/7 supervision and remain unable to privately pay for alternative solutions including temporary respite care at a facility, long-term facility placement, or in-home caregivers.  Patient assessed at the bedside.  Met with patient's granddaughter Desiree Hutchinson and her husband for palliative support.  Patient is eating her lunch, comfortable. Desiree Hutchinson shares that she has asked patient if it is okay to sell her home and she has said no.  She also understands that patient has very limited understanding of what she is being asked or the consequences/implications. Desiree Hutchinson has a meeting scheduled this afternoon at 4 PM with a lawyer for Desiree Hutchinson.  She has taken a week off from work to arrange for some of these things but cannot do this long-term to care for the patient 24/7.   Emotional support and therapeutic listening was provided, validating her concerns.  She has questions about what it would look like if patient needed state guardianship or APS involvement to assist with placement.  Encouraged her to share this with TOC during their next discussion.  We then discussed patient's current symptom management orders in place and will continue medications on an as-needed basis at this time.  Questions and  concerns addressed to the best of my ability.  PMT will continue to follow and support holistically.  Length of Stay: 0  Physical Exam Vitals and nursing note reviewed.  Constitutional:      General: She is not in acute distress.    Appearance: She is ill-appearing.  Pulmonary:     Effort: No respiratory distress.  Skin:    General: Skin is warm and dry.  Neurological:     Mental Status: She is alert.     Motor: Weakness present.  Psychiatric:        Speech: She is noncommunicative.        Cognition and Memory: Cognition is impaired. Memory is  impaired.        Vital Signs: BP (!) 130/50   Pulse 63   Temp (!) 97.2 F (36.2 C) (Axillary)   Resp 18   Ht 5' (1.524 m)   Wt 44 kg   SpO2 98%   BMI 18.94 kg/m  SpO2: SpO2: 98 % O2 Device: O2 Device: Room Air O2 Flow Rate:     Palliative Assessment/Data: PPS 20%    Palliative Care Assessment & Plan   Patient Profile: 88 y.o. female  with past medical history of anemia, aortic valve, chronic diastolic heart failure, COPD, gastritis, and hypertension presented to ED on 11/19/22 from home with family concerns of increased pain.     Assessment: End of life care  Recommendations/Plan: Continue DNR/DNI  Continue full comfort measures Per TOC, would need INPATIENT admission for three midnights to assist in getting patient to facility with hospice.  Patient's granddaughter is meeting with a lawyer today and hopes to complete financial power of attorney paperwork. Unfortunately this is a disposition issue and it will take time to sell patient's assets to pay for care needs, despite clear goals of care PMT will continue to follow and support holistically  Prognosis:  < 6 months  Discharge Planning: To Be Determined  Care plan was discussed with patient's granddaughter, TOC  Total time: I spent 50 minutes in the care of the patient today in the above activities and documenting the encounter.  Dorthy Cooler,  PA-C Palliative Medicine Team Team phone # [phone]  Thank you for allowing the Palliative Medicine Team to assist in the care of this patient. Please utilize secure chat with additional questions, if there is no response within 30 minutes please call the above phone number.  Palliative Medicine Team providers are available by phone from 7am to 7pm daily and can be reached through the team cell phone.  Should this patient require assistance outside of these hours, please call the patient's attending physician.

## 2022-11-24 NOTE — ED Notes (Signed)
Brief changed and pericare performed

## 2022-11-25 DIAGNOSIS — R5381 Other malaise: Secondary | ICD-10-CM | POA: Diagnosis not present

## 2022-11-25 DIAGNOSIS — R410 Disorientation, unspecified: Secondary | ICD-10-CM | POA: Diagnosis not present

## 2022-11-25 DIAGNOSIS — M79606 Pain in leg, unspecified: Secondary | ICD-10-CM | POA: Diagnosis not present

## 2022-11-25 NOTE — ED Notes (Signed)
Dinner tray delivered to patient at this time. Patient's family at bedside to feed patient.

## 2022-11-25 NOTE — Progress Notes (Signed)
   Medical records reviewed. Discussed with PMT RN who will assist with ensuring patient's comfort today. Patient's family is having ongoing discussions with TOC regarding disposition. Goals remain clear for comfort-focused care and family has PMT contact information for further needs.  Thank you for your referral and allowing PMT to assist in [facility] care.   Richardson Dopp, [facility] Palliative Medicine Team  Team Phone # [phone]   NO CHARGE

## 2022-11-25 NOTE — Progress Notes (Signed)
Patient Desiree Hutchinson      DOB: Nov 30, 1920      QDI:[id number]      Palliative Medicine Team    Subjective: Bedside symptom check completed. No family or visitors bedside at time of visit.   Physical exam: Patient resting in bed with eyes closed at time of visit. Breathing even and non-labored, no excessive secretions noted. Patient without physical or non-verbal signs of pain or discomfort at this time. This RN did not wake patient, to promote comfort and rest.    Assessment and plan: No needs identified at this time. This RN remains available to support as appropriate, following TOC for plan. Will continue to follow for any changes or advances.    Thank you for allowing the Palliative Medicine Team to assist in the care of this patient.     Shelda Jakes, MSN, RN4 Palliative Medicine Team Team Phone: [phone]  This phone is monitored 7a-7p, please reach out to attending physician outside of these hours for urgent needs.

## 2022-11-25 NOTE — ED Provider Notes (Signed)
Emergency Medicine Observation Re-evaluation Note  Desiree Hutchinson is a 88 y.o. female, seen on rounds today.  Pt initially presented to the ED for complaints of Leg Pain Currently, the patient is resting comfortably.  Physical Exam  BP 119/87   Pulse 80   Temp 97.6 F (36.4 C)   Resp 18   Ht 5' (1.524 m)   Wt 44 kg   SpO2 97%   BMI 18.94 kg/m  Physical Exam General: no distress Cardiac: regular rate Lungs: clear Psych: confused  ED Course / MDM  EKG:   I have reviewed the labs performed to date as well as medications administered while in observation.  Recent changes in the last 24 hours include : there was a meeting help at 230 for placement discussion. TOC team remains in constant touch with family member.  Plan  Current plan is for continue to hold.    Derwood Kaplan, MD 11/25/22 [phone]

## 2022-11-25 NOTE — ED Notes (Signed)
Patient's family member at bedside at this time.

## 2022-11-25 NOTE — Progress Notes (Signed)
TOC CSW has made [facility] management aware of pts stay, and are currently working to assist pt and family with a safe discharge.  Jkai Arwood Tarpley-Carter, MSW, LCSW-A Pronouns:  She/Her/Hers [facility] Care Clinical Social Worker Direct Number:  [phone] [email or web address]

## 2022-11-25 NOTE — ED Notes (Signed)
Partial linen change and perineal care provided. Purewick placed and brief changed. Patient appears to be resting comfortably at this time call bell at bedside and patient is visualized from the nurses station

## 2022-11-26 DIAGNOSIS — Z789 Other specified health status: Secondary | ICD-10-CM | POA: Diagnosis not present

## 2022-11-26 DIAGNOSIS — R5381 Other malaise: Secondary | ICD-10-CM

## 2022-11-26 DIAGNOSIS — Z515 Encounter for palliative care: Secondary | ICD-10-CM | POA: Diagnosis not present

## 2022-11-26 DIAGNOSIS — M79606 Pain in leg, unspecified: Secondary | ICD-10-CM | POA: Diagnosis not present

## 2022-11-26 DIAGNOSIS — R638 Other symptoms and signs concerning food and fluid intake: Secondary | ICD-10-CM | POA: Diagnosis not present

## 2022-11-26 DIAGNOSIS — Z66 Do not resuscitate: Secondary | ICD-10-CM | POA: Diagnosis not present

## 2022-11-26 DIAGNOSIS — R410 Disorientation, unspecified: Secondary | ICD-10-CM | POA: Diagnosis not present

## 2022-11-26 NOTE — Social Work (Signed)
CSW will need to follow up with [facility] in the am to see status of acceptance

## 2022-11-26 NOTE — Progress Notes (Signed)
Daily Progress Note   Patient Name: Desiree Hutchinson       Date: 11/26/2022 DOB: 03-May-1920  Age: 88 y.o. MRN#: [id number] Attending Physician: Default, Provider, MD Primary Care Physician: Lexine Baton Admit Date: 11/19/2022  Reason for Consultation/Follow-up: Non pain symptom management, Pain control, Psychosocial/spiritual support, and Terminal Care  Subjective: Chart review performed. TOC working on safe discharge plan. Received report from primary RN - no acute concerns. Per RN, patient with small oral intake - accepting of liquids but seems to have a hard time chewing solid food, does better with soft foods. Will adjust patient to soft diet, though she can eat as desires for comfort.   Went to visit patient at bedside - no family/visitors present. Patient was lying in bed asleep - I did not attempt to wake her to preserve comfort. No signs or non-verbal gestures of pain or discomfort noted. No respiratory distress, increased work of breathing, or secretions noted.    Length of Stay: 0  Current Medications: Scheduled Meds:   antiseptic oral rinse  15 mL Topical BID   revefenacin  175 mcg Nebulization Daily    Continuous Infusions:   PRN Meds: acetaminophen (TYLENOL) oral liquid 160 mg/5 mL, albuterol, diphenhydrAMINE, glycopyrrolate **OR** glycopyrrolate **OR** glycopyrrolate, haloperidol **OR** haloperidol **OR** haloperidol lactate, LORazepam **OR** LORazepam **OR** LORazepam, ondansetron **OR** ondansetron (ZOFRAN) IV, oxyCODONE, polyethylene glycol, polyvinyl alcohol  Physical Exam Vitals and nursing note reviewed.  Constitutional:      General: She is not in acute distress.    Appearance: She is ill-appearing.  Pulmonary:     Effort: No respiratory distress.   Skin:    General: Skin is warm and dry.  Neurological:     Motor: Weakness present.     Comments: asleep             Vital Signs: BP (!) 125/59   Pulse 78   Temp 98.7 F (37.1 C) (Axillary)   Resp 16   Ht 5' (1.524 m)   Wt 44 kg   SpO2 99%   BMI 18.94 kg/m  SpO2: SpO2: 99 % O2 Device: O2 Device: Room Air O2 Flow Rate:    Intake/output summary:  Intake/Output Summary (Last 24 hours) at 11/26/2022 1339 Last data filed at 11/26/2022 0916 Gross per 24 hour  Intake --  Output 600 ml  Net -600 ml   LBM:   Baseline Weight: Weight: 44 kg Most recent weight: Weight: 44 kg       Palliative Assessment/Data: PPS 20%      Patient Active Problem List   Diagnosis Date Noted   Senile dementia (HCC) 02/25/2022   Body mass index less than 19, adult 02/25/2022   Leg weakness, bilateral 02/25/2022   Uses walker 02/25/2022   Encopresis 02/25/2022   Diurnal enuresis 02/25/2022   Breast pain, left 11/24/2021   Protein-calorie malnutrition, severe 01/11/2020   Do not resuscitate 01/09/2020   Asymptomatic menopausal state 06/20/2017   Hypocalcemia 09/30/2016   UTI (urinary tract infection) 09/30/2016   Hematochezia    Leukocytopenia    Thrombocytopenia (HCC)    GI bleed 09/12/2016   Acute GI bleeding 09/12/2016   Hemorrhagic shock (HCC)    PAD (peripheral artery disease) (HCC) 04/06/2016   Iron deficiency anemia 02/13/2016   Urinary incontinence 09/26/2015   Chronic diastolic CHF (congestive heart failure) (HCC) 08/20/2015   SBO (small bowel obstruction) (HCC) 08/06/2015   Acute kidney injury superimposed on chronic kidney disease (HCC) 08/06/2015   Protein-calorie malnutrition (HCC) 07/21/2015   Lower GI bleed 07/19/2015   Diverticulosis of colon with hemorrhage 07/19/2015   Acute blood loss anemia 07/19/2015   Constipation 01/10/2015   Loss of weight 01/10/2015   Memory loss of 01/10/2015   Hearing loss 01/08/2014   Renal insufficiency 03/27/2013   Encounter for  long-term (current) use of other medications 03/27/2013   Aortic valve disorder 06/15/2010   CHRONIC RHINITIS 06/26/2009   SHORTNESS OF BREATH 05/29/2009   OTHER DYSPNEA AND RESPIRATORY ABNORMALITIES 05/29/2009   PRECORDIAL PAIN 05/29/2009   COPD with emphysema Gold Stage A 05/28/2009   SMALL BOWEL OBSTRUCTION, HX OF 05/28/2009   GASTRITIS 03/28/2008   COUGH 12/07/2007   Essential hypertension 10/14/2007   Hypotension 10/14/2007   VENTRICULAR HYPERTROPHY, LEFT 10/14/2007   ALLERGIC RHINITIS 10/14/2007   CHEST PAIN, NON-CARDIAC 10/14/2007   TOBACCO USE, QUIT 10/14/2007    Palliative Care Assessment & Plan   Patient Profile: 88 y.o. female  with past medical history of anemia, aortic valve, chronic diastolic heart failure, COPD, gastritis, and hypertension presented to ED on 11/19/22 from home with family concerns of increased pain.    Assessment: Terminal care   Recommendations/Plan: Continue full comfort measures Continue DNR/DNI as previously documented Continue current comfort focused medication regimen - no changes today Diet adjusted to Dsy 3/soft as it's easier for her to chew; however, can eat/drink as desires for comfort PMT will continue to follow and support holistically  Goals of Care and Additional Recommendations: Limitations on Scope of Treatment: Full Comfort Care  Code Status:    Code Status Orders  (From admission, onward)           Start     Ordered   11/19/22 1737  Do not attempt resuscitation (DNR)  Continuous       Question Answer Comment  In the event of cardiac or respiratory ARREST Do not call a "code blue"   In the event of cardiac or respiratory ARREST Do not perform Intubation, CPR, defibrillation or ACLS   In the event of cardiac or respiratory ARREST Use medication by any route, position, wound care, and other measures to relive pain and suffering. May use oxygen, suction and manual treatment of airway obstruction as needed for comfort.       11/19/22 1744  Code Status History     Date Active Date Inactive Code Status Order ID Comments User Context   01/09/2020 1132 01/13/2020 0008 DNR [id number]  Clydie Braun, MD ED   01/09/2020 1052 01/09/2020 1132 Full Code [id number]  Clydie Braun, MD ED   09/12/2016 [phone] 09/15/2016 2108 DNR [id number]  Jackie Plum, MD Inpatient   08/06/2015 1048 08/22/2015 1703 Full Code [id number]  Leatha Gilding, MD ED   07/19/2015 2149 07/23/2015 2047 Full Code [id number]  Hillary Bow, DO ED      Advance Directive Documentation    Flowsheet Row Most Recent Value  Type of Advance Directive Healthcare Power of Attorney  Pre-existing out of facility DNR order (yellow form or pink MOST form) --  "MOST" Form in Place? --       Prognosis:  < 6 months  Discharge Planning: To Be Determined  Care plan was discussed with primary RN  Thank you for allowing the Palliative Medicine Team to assist in the care of this patient.  Haskel Khan, NP  Please contact Palliative Medicine Team phone at [phone] for questions and concerns.   *Portions of this note are a verbal dictation therefore any spelling and/or grammatical errors are due to the "[employer]" system interpretation.

## 2022-11-26 NOTE — ED Notes (Signed)
Patient resting, no s/s of any distress. No verbal c/o pain or discomfort. Patient remains on comfort care. Will continue to monitor  

## 2022-11-26 NOTE — ED Notes (Signed)
Patient resting, no s/s of any distress. No verbal c/o pain or discomfort. Patient remains on comfort care. Will continue to monitor

## 2022-11-26 NOTE — ED Notes (Addendum)
Charted on wrong pt.

## 2022-11-26 NOTE — ED Notes (Signed)
Patient resting, no s/s of any distress. No verbal c/o pain or discomfort. Incontinent care given. Patient remains on comfort care. Will continue to monitor

## 2022-11-26 NOTE — ED Notes (Signed)
Vital signs stable. 

## 2022-11-26 NOTE — Progress Notes (Signed)
Desiree Hutchinson is considering this Patient TOC will keep updating as they come.

## 2022-11-26 NOTE — Progress Notes (Signed)
Patient Desiree Hutchinson      DOB: 08/17/1920      JKK:[id number]      Palliative Medicine Team    Subjective: Bedside symptom check completed. No family or visitors present at time of visit.   Physical exam: Patient resting in bed with eyes closed at time of visit. Breathing even and non-labored, no excessive secretions noted. Patient without physical or non-verbal signs of pain or discomfort at this time. This RN did not attempt to wake patient, to ensure comfort.    Assessment and plan: Disposition pending, TOC working with family. PMT available to assist as able. Will continue to follow for any changes or advances.    Thank you for allowing the Palliative Medicine Team to assist in the care of this patient.     Shelda Jakes, MSN, RN4 Palliative Medicine Team Team Phone: [phone]  This phone is monitored 7a-7p, please reach out to attending physician outside of these hours for urgent needs.

## 2022-11-26 NOTE — ED Provider Notes (Signed)
Emergency Medicine Observation Re-evaluation Note  Desiree Hutchinson is a 88 y.o. female, seen on rounds today.  Pt initially presented to the ED for complaints of Leg Pain Currently, the patient is resting in bed.  Physical Exam  BP (!) 125/59   Pulse 78   Temp 98.7 F (37.1 C) (Axillary)   Resp 16   Ht 1.524 m (5')   Wt 44 kg   SpO2 99%   BMI 18.94 kg/m  Physical Exam   ED Course / MDM  EKG:   I have reviewed the labs performed to date as well as medications administered while in observation.  Recent changes in the last 24 hours include none.  Plan  Current plan is for placement.    Lorre Nick, MD 11/26/22 [phone]

## 2022-11-27 DIAGNOSIS — R410 Disorientation, unspecified: Secondary | ICD-10-CM | POA: Diagnosis not present

## 2022-11-27 DIAGNOSIS — Z66 Do not resuscitate: Secondary | ICD-10-CM | POA: Diagnosis not present

## 2022-11-27 DIAGNOSIS — R5381 Other malaise: Secondary | ICD-10-CM | POA: Diagnosis not present

## 2022-11-27 DIAGNOSIS — Z789 Other specified health status: Secondary | ICD-10-CM | POA: Diagnosis not present

## 2022-11-27 DIAGNOSIS — Z515 Encounter for palliative care: Secondary | ICD-10-CM | POA: Diagnosis not present

## 2022-11-27 NOTE — Progress Notes (Signed)
TOC CSW attempted to contact [facility] [phone].  CSW left HIPPA compliant message with my contact information.  CSW will try again later.  Janalynn Eder Tarpley-Carter, MSW, LCSW-A Pronouns:  She/Her/Hers [facility] of Care Clinical Social Worker Direct Number:  [phone] [email or web address]

## 2022-11-27 NOTE — ED Notes (Signed)
Received verbal report from Catherine W. RN at this time 

## 2022-11-27 NOTE — ED Provider Notes (Signed)
Emergency Medicine Observation Re-evaluation Note  Desiree Hutchinson is a 88 y.o. female, seen on rounds today.  Pt initially presented to the ED for complaints of Leg Pain Currently, the patient is resting comfortably in bed.  Physical Exam  BP 130/62 (BP Location: Right Arm)   Pulse 81   Temp 97.9 F (36.6 C) (Oral)   Resp 16   Ht 5' (1.524 m)   Wt 44 kg   SpO2 99%   BMI 18.94 kg/m  Physical Exam Vitals and nursing note reviewed.  Constitutional:      General: She is not in acute distress.    Appearance: She is well-developed.  HENT:     Head: Normocephalic and atraumatic.  Eyes:     Conjunctiva/sclera: Conjunctivae normal.  Cardiovascular:     Rate and Rhythm: Normal rate and regular rhythm.     Heart sounds: No murmur heard. Pulmonary:     Effort: Pulmonary effort is normal. No respiratory distress.  Musculoskeletal:        General: No swelling.     Cervical back: Neck supple.  Skin:    General: Skin is warm and dry.     Capillary Refill: Capillary refill takes less than 2 seconds.  Neurological:     Mental Status: She is alert. Mental status is at baseline. She is disoriented.     Comments: Pleasantly demented      ED Course / MDM  EKG:   I have reviewed the labs performed to date as well as medications administered while in observation.  Recent changes in the last 24 hours include continued social work assessment.  Plan  Current plan is for ultimate transfer to [facility] if possible.    Glendora Score, MD 11/27/22 [phone]

## 2022-11-27 NOTE — Progress Notes (Signed)
Daily Progress Note   Patient Name: Desiree Hutchinson       Date: 11/27/2022 DOB: 09-Jan-1920  Age: 88 y.o. MRN#: [id number] Attending Physician: Default, Provider, MD Primary Care Physician: Marcellina Millin Admit Date: 11/19/2022  Reason for Consultation/Follow-up: Non pain symptom management, Pain control, Psychosocial/spiritual support, and Terminal Care  Subjective: Chart review performed. Received report from primary RN - no acute concerns. RN reports patient is lethargic, not eating, drinking minimally.   Went to visit patient at bedside - no family/visitors present. Patient was lying in bed asleep - I did not attempt to wake her to preserve comfort. No signs or non-verbal gestures of pain or discomfort noted. No respiratory distress, increased work of breathing, or secretions noted.   TOC working on safe discharge. Per notes, [facility] considering patient.  Length of Stay: 0  Current Medications: Scheduled Meds:   antiseptic oral rinse  15 mL Topical BID   revefenacin  175 mcg Nebulization Daily    Continuous Infusions:   PRN Meds: acetaminophen (TYLENOL) oral liquid 160 mg/5 mL, albuterol, diphenhydrAMINE, glycopyrrolate **OR** glycopyrrolate **OR** glycopyrrolate, haloperidol **OR** haloperidol **OR** haloperidol lactate, LORazepam **OR** LORazepam **OR** LORazepam, ondansetron **OR** ondansetron (ZOFRAN) IV, oxyCODONE, polyethylene glycol, polyvinyl alcohol  Physical Exam Vitals and nursing note reviewed.  Constitutional:      General: She is not in acute distress.    Appearance: She is ill-appearing.  Pulmonary:     Effort: No respiratory distress.  Skin:    General: Skin is warm and dry.  Neurological:     Mental Status: She is lethargic.     Motor: Weakness  present.     Comments: asleep             Vital Signs: BP 130/62 (BP Location: Right Arm)   Pulse 81   Temp 97.9 F (36.6 C) (Oral)   Resp 16   Ht 5' (1.524 m)   Wt 44 kg   SpO2 99%   BMI 18.94 kg/m  SpO2: SpO2: 99 % O2 Device: O2 Device: Room Air O2 Flow Rate:    Intake/output summary: No intake or output data in the 24 hours ending 11/27/22 1055 LBM:   Baseline Weight: Weight: 44 kg Most recent weight: Weight: 44 kg       Palliative Assessment/Data: PPS 20%  Patient Active Problem List   Diagnosis Date Noted   Physical deconditioning 11/26/2022   Palliative care by specialist 11/26/2022   Decreased oral intake 11/26/2022   Terminal care 11/26/2022   Senile dementia (HCC) 02/25/2022   Body mass index less than 19, adult 02/25/2022   Leg weakness, bilateral 02/25/2022   Uses walker 02/25/2022   Encopresis 02/25/2022   Diurnal enuresis 02/25/2022   Breast pain, left 11/24/2021   Protein-calorie malnutrition, severe 01/11/2020   Do not resuscitate 01/09/2020   Asymptomatic menopausal state 06/20/2017   Hypocalcemia 09/30/2016   UTI (urinary tract infection) 09/30/2016   Hematochezia    Leukocytopenia    Thrombocytopenia (HCC)    GI bleed 09/12/2016   Acute GI bleeding 09/12/2016   Hemorrhagic shock (HCC)    PAD (peripheral artery disease) (HCC) 04/06/2016   Iron deficiency anemia 02/13/2016   Urinary incontinence 09/26/2015   Chronic diastolic CHF (congestive heart failure) (HCC) 08/20/2015   SBO (small bowel obstruction) (HCC) 08/06/2015   Acute kidney injury superimposed on chronic kidney disease (HCC) 08/06/2015   Protein-calorie malnutrition (HCC) 07/21/2015   Lower GI bleed 07/19/2015   Diverticulosis of colon with hemorrhage 07/19/2015   Acute blood loss anemia 07/19/2015   Constipation 01/10/2015   Loss of weight 01/10/2015   Memory loss of 01/10/2015   Hearing loss 01/08/2014   Renal insufficiency 03/27/2013   Encounter for long-term  (current) use of other medications 03/27/2013   Aortic valve disorder 06/15/2010   CHRONIC RHINITIS 06/26/2009   SHORTNESS OF BREATH 05/29/2009   OTHER DYSPNEA AND RESPIRATORY ABNORMALITIES 05/29/2009   PRECORDIAL PAIN 05/29/2009   COPD with emphysema Gold Stage A 05/28/2009   SMALL BOWEL OBSTRUCTION, HX OF 05/28/2009   GASTRITIS 03/28/2008   COUGH 12/07/2007   Essential hypertension 10/14/2007   Hypotension 10/14/2007   VENTRICULAR HYPERTROPHY, LEFT 10/14/2007   ALLERGIC RHINITIS 10/14/2007   CHEST PAIN, NON-CARDIAC 10/14/2007   TOBACCO USE, QUIT 10/14/2007    Palliative Care Assessment & Plan   Patient Profile: 88 y.o. female  with past medical history of anemia, aortic valve, chronic diastolic heart failure, COPD, gastritis, and hypertension presented to ED on 11/19/22 from home with family concerns of increased pain.     Assessment: Active Problems:   Physical deconditioning   Palliative care by specialist   Decreased oral intake   Terminal care   Recommendations/Plan: Continue full comfort measures Continue DNR/DNI as previously documented Continue current comfort focused medication regimen - no changes today TOC working on safe discharge plan PMT will continue to follow and support holistically  Goals of Care and Additional Recommendations: Limitations on Scope of Treatment: Full Comfort Care  Code Status:    Code Status Orders  (From admission, onward)           Start     Ordered   11/19/22 1737  Do not attempt resuscitation (DNR)  Continuous       Question Answer Comment  In the event of cardiac or respiratory ARREST Do not call a "code blue"   In the event of cardiac or respiratory ARREST Do not perform Intubation, CPR, defibrillation or ACLS   In the event of cardiac or respiratory ARREST Use medication by any route, position, wound care, and other measures to relive pain and suffering. May use oxygen, suction and manual treatment of airway  obstruction as needed for comfort.      11/19/22 1744           Code Status History  Date Active Date Inactive Code Status Order ID Comments User Context   01/09/2020 1132 01/13/2020 0008 DNR [id number]  Norval Morton, MD ED   01/09/2020 1052 01/09/2020 1132 Full Code [id number]  Norval Morton, MD ED   09/12/2016 [id number] 09/15/2016 2108 DNR [id number]  Benito Mccreedy, MD Inpatient   08/06/2015 1048 08/22/2015 1703 Full Code [id number]  Caren Griffins, MD ED   07/19/2015 2149 07/23/2015 2047 Full Code [id number]  Etta Quill, DO ED      Advance Directive Documentation    Flowsheet Row Most Recent Value  Type of Advance Directive Healthcare Power of Attorney  Pre-existing out of facility DNR order (yellow form or pink MOST form) --  "MOST" Form in Place? --       Prognosis:  < 3 months  Discharge Planning: To Be Determined  Care plan was discussed with primary RN  Thank you for allowing the Palliative Medicine Team to assist in the care of this patient.  Lin Landsman, NP  Please contact Palliative Medicine Team phone at [phone] for questions and concerns.   *Portions of this note are a verbal dictation therefore any spelling and/or grammatical errors are due to the "[employer]" system interpretation.

## 2022-11-27 NOTE — ED Notes (Signed)
Pt resting comfortably/sleeping/

## 2022-11-27 NOTE — Progress Notes (Signed)
TOC CSW attempted to contact [facility] with no answer.  CSW left HIPPA compliant message with my contact information.  CSW will continue to reach out to [place].  Velisa Regnier Tarpley-Carter, MSW, LCSW-A Pronouns:  She/Her/Hers [facility] Clinical Social Worker Direct Number:  [phone] [email or web address]

## 2022-11-28 DIAGNOSIS — R5381 Other malaise: Secondary | ICD-10-CM | POA: Diagnosis not present

## 2022-11-28 DIAGNOSIS — R638 Other symptoms and signs concerning food and fluid intake: Secondary | ICD-10-CM | POA: Diagnosis not present

## 2022-11-28 DIAGNOSIS — Z515 Encounter for palliative care: Secondary | ICD-10-CM | POA: Diagnosis not present

## 2022-11-28 DIAGNOSIS — Z66 Do not resuscitate: Secondary | ICD-10-CM | POA: Diagnosis not present

## 2022-11-28 DIAGNOSIS — R410 Disorientation, unspecified: Secondary | ICD-10-CM | POA: Diagnosis not present

## 2022-11-28 DIAGNOSIS — M79606 Pain in leg, unspecified: Secondary | ICD-10-CM | POA: Diagnosis not present

## 2022-11-28 DIAGNOSIS — Z789 Other specified health status: Secondary | ICD-10-CM | POA: Diagnosis not present

## 2022-11-28 NOTE — ED Notes (Signed)
Pt resting quietly with NAD noted, resp equal and non-labored, side rails up and call bell in reach. Pt has rested quietly with her eyes closed with NAD noted and resp equal and non-labored through the night with no issues or change in status

## 2022-11-28 NOTE — Progress Notes (Signed)
Daily Progress Note   Patient Name: Desiree Hutchinson       Date: 11/28/2022 DOB: February 07, 1920  Age: 88 y.o. MRN#: [id number] Attending Physician: Default, Provider, MD Primary Care Physician: Lexine Baton Admit Date: 11/19/2022  Reason for Consultation/Follow-up: Non pain symptom management, Pain control, Psychosocial/spiritual support, and Terminal Care  Subjective: Chart review performed. Received report from primary RN - no acute concerns. RN states patient is interactive at mealtime, but otherwise is asleep. Eating/drinking moderately per RN. Tolerating soft diet.  Went to visit patient at bedside - no family/visitors present. Patient was lying in bed asleep, she does briefly open her eyes to voice. No signs or non-verbal gestures of pain or discomfort noted. No respiratory distress, increased work of breathing, or secretions noted.   TOC is waiting to hear back from [facility] Monday about possible placement.  Length of Stay: 0  Current Medications: Scheduled Meds:   antiseptic oral rinse  15 mL Topical BID   revefenacin  175 mcg Nebulization Daily    Continuous Infusions:   PRN Meds: acetaminophen (TYLENOL) oral liquid 160 mg/5 mL, albuterol, diphenhydrAMINE, glycopyrrolate **OR** glycopyrrolate **OR** glycopyrrolate, haloperidol **OR** haloperidol **OR** haloperidol lactate, LORazepam **OR** LORazepam **OR** LORazepam, ondansetron **OR** ondansetron (ZOFRAN) IV, oxyCODONE, polyethylene glycol, polyvinyl alcohol  Physical Exam Vitals and nursing note reviewed.  Constitutional:      General: She is not in acute distress.    Appearance: She is ill-appearing.  Pulmonary:     Effort: No respiratory distress.  Skin:    General: Skin is warm and dry.  Neurological:      Motor: Weakness present.     Comments: asleep             Vital Signs: BP 125/61   Pulse 75   Temp 97.9 F (36.6 C)   Resp 14   Ht 5' (1.524 m)   Wt 44 kg   SpO2 99%   BMI 18.94 kg/m  SpO2: SpO2: 99 % O2 Device: O2 Device: Room Air O2 Flow Rate:    Intake/output summary: No intake or output data in the 24 hours ending 11/28/22 1150 LBM:   Baseline Weight: Weight: 44 kg Most recent weight: Weight: 44 kg       Palliative Assessment/Data: PPS 20%      Patient Active Problem List  Diagnosis Date Noted   Physical deconditioning 11/26/2022   Palliative care by specialist 11/26/2022   Decreased oral intake 11/26/2022   Terminal care 11/26/2022   Senile dementia (HCC) 02/25/2022   Body mass index less than 19, adult 02/25/2022   Leg weakness, bilateral 02/25/2022   Uses walker 02/25/2022   Encopresis 02/25/2022   Diurnal enuresis 02/25/2022   Breast pain, left 11/24/2021   Protein-calorie malnutrition, severe 01/11/2020   Do not resuscitate 01/09/2020   Asymptomatic menopausal state 06/20/2017   Hypocalcemia 09/30/2016   UTI (urinary tract infection) 09/30/2016   Hematochezia    Leukocytopenia    Thrombocytopenia (HCC)    GI bleed 09/12/2016   Acute GI bleeding 09/12/2016   Hemorrhagic shock (HCC)    PAD (peripheral artery disease) (HCC) 04/06/2016   Iron deficiency anemia 02/13/2016   Urinary incontinence 09/26/2015   Chronic diastolic CHF (congestive heart failure) (HCC) 08/20/2015   SBO (small bowel obstruction) (HCC) 08/06/2015   Acute kidney injury superimposed on chronic kidney disease (HCC) 08/06/2015   Protein-calorie malnutrition (HCC) 07/21/2015   Lower GI bleed 07/19/2015   Diverticulosis of colon with hemorrhage 07/19/2015   Acute blood loss anemia 07/19/2015   Constipation 01/10/2015   Loss of weight 01/10/2015   Memory loss of 01/10/2015   Hearing loss 01/08/2014   Renal insufficiency 03/27/2013   Encounter for long-term (current) use of  other medications 03/27/2013   Aortic valve disorder 06/15/2010   CHRONIC RHINITIS 06/26/2009   SHORTNESS OF BREATH 05/29/2009   OTHER DYSPNEA AND RESPIRATORY ABNORMALITIES 05/29/2009   PRECORDIAL PAIN 05/29/2009   COPD with emphysema Gold Stage A 05/28/2009   SMALL BOWEL OBSTRUCTION, HX OF 05/28/2009   GASTRITIS 03/28/2008   COUGH 12/07/2007   Essential hypertension 10/14/2007   Hypotension 10/14/2007   VENTRICULAR HYPERTROPHY, LEFT 10/14/2007   ALLERGIC RHINITIS 10/14/2007   CHEST PAIN, NON-CARDIAC 10/14/2007   TOBACCO USE, QUIT 10/14/2007    Palliative Care Assessment & Plan   Patient Profile: 88 y.o. female  with past medical history of anemia, aortic valve, chronic diastolic heart failure, COPD, gastritis, and hypertension presented to ED on 11/19/22 from home with family concerns of increased pain.      Assessment: Active Problems:   Physical deconditioning   Palliative care by specialist   Decreased oral intake   Terminal care   Recommendations/Plan: Continue full comfort measures Continue DNR/DNI as previously documented Continue current comfort focused medication regimen - no changes today TOC working on safe discharge plan PMT will continue to follow and support holistically  Goals of Care and Additional Recommendations: Limitations on Scope of Treatment: Full Comfort Care  Code Status:    Code Status Orders  (From admission, onward)           Start     Ordered   11/19/22 1737  Do not attempt resuscitation (DNR)  Continuous       Question Answer Comment  In the event of cardiac or respiratory ARREST Do not call a "code blue"   In the event of cardiac or respiratory ARREST Do not perform Intubation, CPR, defibrillation or ACLS   In the event of cardiac or respiratory ARREST Use medication by any route, position, wound care, and other measures to relive pain and suffering. May use oxygen, suction and manual treatment of airway obstruction as needed for  comfort.      11/19/22 1744           Code Status History     Date  Active Date Inactive Code Status Order ID Comments User Context   01/09/2020 1132 01/13/2020 0008 DNR [id number]  Clydie Braun, MD ED   01/09/2020 1052 01/09/2020 1132 Full Code [id number]  Clydie Braun, MD ED   09/12/2016 [phone] 09/15/2016 2108 DNR [id number]  Jackie Plum, MD Inpatient   08/06/2015 1048 08/22/2015 1703 Full Code [id number]  Leatha Gilding, MD ED   07/19/2015 2149 07/23/2015 2047 Full Code [id number]  Hillary Bow, DO ED      Advance Directive Documentation    Flowsheet Row Most Recent Value  Type of Advance Directive Healthcare Power of Attorney  Pre-existing out of facility DNR order (yellow form or pink MOST form) --  "MOST" Form in Place? --       Prognosis:  < 3 months  Discharge Planning: To Be Determined  Care plan was discussed with primary RN  Thank you for allowing the Palliative Medicine Team to assist in the care of this patient.   Haskel Khan, NP  Please contact Palliative Medicine Team phone at [phone] for questions and concerns.   *Portions of this note are a verbal dictation therefore any spelling and/or grammatical errors are due to the "Dragon Medical One" system interpretation.

## 2022-11-28 NOTE — ED Provider Notes (Signed)
  Physical Exam  BP 130/62 (BP Location: Right Arm)   Pulse 81   Temp 97.9 F (36.6 C) (Oral)   Resp 16   Ht 5' (1.524 m)   Wt 44 kg   SpO2 99%   BMI 18.94 kg/m   Physical Exam  Procedures  Procedures  ED Course / MDM   Clinical Course as of 11/28/22 0740  Fri Nov 19, 2022  6962 Per case management, cannot call APS without having a meeting first. Plan is to have meeting. Medicine not admitting. Patient does not have safe discharge plan. Patient will be boarder status.  [HN]  1958 Comfort care meds ordered by palliative care. Home meds ordered. DNR status updated. Patient will be made boarder status and will be followed by TOC.  [HN]    Clinical Course User Index [HN] Loetta Rough, MD   Medical Decision Making Amount and/or Complexity of Data Reviewed Labs: ordered. Radiology: ordered.  Risk Prescription drug management.   Patient has been holding now for 207 hours.  Pending placement potentially at [facility] but reportedly cannot call until Monday with today being Sunday.       Benjiman Core, MD 11/28/22 [phone]

## 2022-11-28 NOTE — ED Notes (Signed)
Walked into room, patient I resting in bed. Fed patient her dinner and wiped her face. Repositioned her in bed.

## 2022-11-29 DIAGNOSIS — Z789 Other specified health status: Secondary | ICD-10-CM | POA: Diagnosis not present

## 2022-11-29 DIAGNOSIS — M79606 Pain in leg, unspecified: Secondary | ICD-10-CM | POA: Diagnosis not present

## 2022-11-29 DIAGNOSIS — Z515 Encounter for palliative care: Secondary | ICD-10-CM | POA: Diagnosis not present

## 2022-11-29 DIAGNOSIS — Z66 Do not resuscitate: Secondary | ICD-10-CM | POA: Diagnosis not present

## 2022-11-29 DIAGNOSIS — Z7401 Bed confinement status: Secondary | ICD-10-CM | POA: Diagnosis not present

## 2022-11-29 DIAGNOSIS — R5381 Other malaise: Secondary | ICD-10-CM | POA: Diagnosis not present

## 2022-11-29 DIAGNOSIS — R410 Disorientation, unspecified: Secondary | ICD-10-CM | POA: Diagnosis not present

## 2022-11-29 DIAGNOSIS — R404 Transient alteration of awareness: Secondary | ICD-10-CM | POA: Diagnosis not present

## 2022-11-29 NOTE — Progress Notes (Addendum)
This CSW has followed up with Desiree Hutchinson at [facility] who states she is taking a look at the pt. TOC following.   Addend @ 9:35 AM Desiree Hutchinson is reviewing the pt and requested TOC supervisor, Desiree Hutchinson, to give her a call. Desiree Hutchinson notified and shared she will attempt to contact when she has a moment.   Addend @ 10:39 AM Per Desiree Hutchinson at [facility] is checking with their business office to determine if they can accept the pt today. TOC following.

## 2022-11-29 NOTE — Progress Notes (Addendum)
TOC Supervisor informed that [facility] will accept pt today. EDP and RN notified via secure chat. Granddaughter also notified. PTAR to transport.   Addend @ 1:50 PM Kitty notified this CSW that the granddaughter has some concerns. This CSW has attempted to contact Evette twice and left HIPAA compliant voicemails.  Addend @ 2:34 PM This CSW spoke with the pt's granddaughter, [name], who states she is fine with her grandmother discharging to [place]. This CSW will coordinate d/c at this time. Kitty, admissions rep at [facility] notified via phone.   Addend @ 3:06 PM Report given to Ricquita.

## 2022-11-29 NOTE — ED Provider Notes (Signed)
Emergency Medicine Observation Re-evaluation Note  Desiree Hutchinson is a 88 y.o. female, seen on rounds today.  Pt initially presented to the ED for complaints of Leg Pain Currently, the patient is patient resting comfortably in bed.  She is in no acute distress.  Physical Exam  BP (!) 111/55   Pulse 90   Temp 97.6 F (36.4 C) (Axillary)   Resp 16   Ht 1.524 m (5')   Wt 44 kg   SpO2 100%   BMI 18.94 kg/m  Physical Exam   ED Course / MDM  EKG:   I have reviewed the labs performed to date as well as medications administered while in observation.  Recent changes in the last 24 hours include none.  Plan  Current plan is for placement.    Lorre Nick, MD 11/29/22 1038

## 2022-11-29 NOTE — Progress Notes (Signed)
PTAR called.  Desiree Hutchinson, MSW, LCSW-A Pronouns:  She/Her/Hers [facility] HealthTransitions of Care Clinical Social Worker Direct Number:  [phone] [email or web address]

## 2022-11-29 NOTE — ED Notes (Signed)
Patient is resting in bed.

## 2022-11-29 NOTE — ED Notes (Signed)
Fed pt breakfast, ate about 40% of tray, drank juice and some water.

## 2022-11-29 NOTE — ED Notes (Addendum)
Patient was incontinent stool. Patient was cleaned and  purewick was replaced. Canister was emptied aswell. Warm blankets provided for comfort. Patient is resting in bed.

## 2022-11-29 NOTE — ED Notes (Signed)
Received verbal report from Whitney H RN at this time 

## 2022-11-29 NOTE — ED Notes (Signed)
Patient is resting in bed. Lowered head of the bed for comfort.

## 2022-11-30 DIAGNOSIS — J449 Chronic obstructive pulmonary disease, unspecified: Secondary | ICD-10-CM | POA: Diagnosis not present

## 2022-11-30 DIAGNOSIS — F02A3 Dementia in other diseases classified elsewhere, mild, with mood disturbance: Secondary | ICD-10-CM | POA: Diagnosis not present

## 2022-12-01 ENCOUNTER — Other Ambulatory Visit: Payer: Self-pay | Admitting: *Deleted

## 2022-12-01 DIAGNOSIS — R1311 Dysphagia, oral phase: Secondary | ICD-10-CM | POA: Diagnosis not present

## 2022-12-01 DIAGNOSIS — J449 Chronic obstructive pulmonary disease, unspecified: Secondary | ICD-10-CM | POA: Diagnosis not present

## 2022-12-01 DIAGNOSIS — R2689 Other abnormalities of gait and mobility: Secondary | ICD-10-CM | POA: Diagnosis not present

## 2022-12-01 DIAGNOSIS — M6281 Muscle weakness (generalized): Secondary | ICD-10-CM | POA: Diagnosis not present

## 2022-12-01 NOTE — Patient Outreach (Signed)
Per [facility] Mrs. Pringle recently admitted to [facility]. Screening for [facility] care coordination services as benefit of PCP and insurance plan.  Update received from SNF therapy manager, Ana. Family not interested in hospice at this time. Goal is to return home.   Will continue to follow and plan outreach to family as appropriate.    Raiford Noble, MSN, RN,BSN [facility] Post Acute Care Coordinator [phone] (Direct dial)

## 2022-12-02 DIAGNOSIS — M6281 Muscle weakness (generalized): Secondary | ICD-10-CM | POA: Diagnosis not present

## 2022-12-02 DIAGNOSIS — J449 Chronic obstructive pulmonary disease, unspecified: Secondary | ICD-10-CM | POA: Diagnosis not present

## 2022-12-02 DIAGNOSIS — R2689 Other abnormalities of gait and mobility: Secondary | ICD-10-CM | POA: Diagnosis not present

## 2022-12-02 DIAGNOSIS — R1311 Dysphagia, oral phase: Secondary | ICD-10-CM | POA: Diagnosis not present

## 2022-12-03 DIAGNOSIS — R1311 Dysphagia, oral phase: Secondary | ICD-10-CM | POA: Diagnosis not present

## 2022-12-03 DIAGNOSIS — R2689 Other abnormalities of gait and mobility: Secondary | ICD-10-CM | POA: Diagnosis not present

## 2022-12-03 DIAGNOSIS — J449 Chronic obstructive pulmonary disease, unspecified: Secondary | ICD-10-CM | POA: Diagnosis not present

## 2022-12-03 DIAGNOSIS — M6281 Muscle weakness (generalized): Secondary | ICD-10-CM | POA: Diagnosis not present

## 2022-12-04 DIAGNOSIS — R1311 Dysphagia, oral phase: Secondary | ICD-10-CM | POA: Diagnosis not present

## 2022-12-04 DIAGNOSIS — M6281 Muscle weakness (generalized): Secondary | ICD-10-CM | POA: Diagnosis not present

## 2022-12-04 DIAGNOSIS — J449 Chronic obstructive pulmonary disease, unspecified: Secondary | ICD-10-CM | POA: Diagnosis not present

## 2022-12-04 DIAGNOSIS — R2689 Other abnormalities of gait and mobility: Secondary | ICD-10-CM | POA: Diagnosis not present

## 2022-12-05 DIAGNOSIS — M6281 Muscle weakness (generalized): Secondary | ICD-10-CM | POA: Diagnosis not present

## 2022-12-05 DIAGNOSIS — J449 Chronic obstructive pulmonary disease, unspecified: Secondary | ICD-10-CM | POA: Diagnosis not present

## 2022-12-05 DIAGNOSIS — R2689 Other abnormalities of gait and mobility: Secondary | ICD-10-CM | POA: Diagnosis not present

## 2022-12-05 DIAGNOSIS — R1311 Dysphagia, oral phase: Secondary | ICD-10-CM | POA: Diagnosis not present

## 2022-12-06 DIAGNOSIS — R2689 Other abnormalities of gait and mobility: Secondary | ICD-10-CM | POA: Diagnosis not present

## 2022-12-06 DIAGNOSIS — J449 Chronic obstructive pulmonary disease, unspecified: Secondary | ICD-10-CM | POA: Diagnosis not present

## 2022-12-06 DIAGNOSIS — R1311 Dysphagia, oral phase: Secondary | ICD-10-CM | POA: Diagnosis not present

## 2022-12-06 DIAGNOSIS — M6281 Muscle weakness (generalized): Secondary | ICD-10-CM | POA: Diagnosis not present

## 2022-12-06 DIAGNOSIS — F02A3 Dementia in other diseases classified elsewhere, mild, with mood disturbance: Secondary | ICD-10-CM | POA: Diagnosis not present

## 2022-12-07 DIAGNOSIS — M6281 Muscle weakness (generalized): Secondary | ICD-10-CM | POA: Diagnosis not present

## 2022-12-07 DIAGNOSIS — R2689 Other abnormalities of gait and mobility: Secondary | ICD-10-CM | POA: Diagnosis not present

## 2022-12-07 DIAGNOSIS — R1311 Dysphagia, oral phase: Secondary | ICD-10-CM | POA: Diagnosis not present

## 2022-12-07 DIAGNOSIS — J449 Chronic obstructive pulmonary disease, unspecified: Secondary | ICD-10-CM | POA: Diagnosis not present

## 2022-12-08 DIAGNOSIS — R1311 Dysphagia, oral phase: Secondary | ICD-10-CM | POA: Diagnosis not present

## 2022-12-08 DIAGNOSIS — M6281 Muscle weakness (generalized): Secondary | ICD-10-CM | POA: Diagnosis not present

## 2022-12-08 DIAGNOSIS — J449 Chronic obstructive pulmonary disease, unspecified: Secondary | ICD-10-CM | POA: Diagnosis not present

## 2022-12-08 DIAGNOSIS — R2689 Other abnormalities of gait and mobility: Secondary | ICD-10-CM | POA: Diagnosis not present

## 2022-12-09 ENCOUNTER — Telehealth: Payer: Self-pay | Admitting: Medical

## 2022-12-09 ENCOUNTER — Non-Acute Institutional Stay: Payer: Self-pay | Admitting: Hospice

## 2022-12-09 ENCOUNTER — Other Ambulatory Visit: Payer: Self-pay | Admitting: *Deleted

## 2022-12-09 DIAGNOSIS — R1311 Dysphagia, oral phase: Secondary | ICD-10-CM | POA: Diagnosis not present

## 2022-12-09 DIAGNOSIS — F0390 Unspecified dementia without behavioral disturbance: Secondary | ICD-10-CM

## 2022-12-09 DIAGNOSIS — F02A3 Dementia in other diseases classified elsewhere, mild, with mood disturbance: Secondary | ICD-10-CM | POA: Diagnosis not present

## 2022-12-09 DIAGNOSIS — E43 Unspecified severe protein-calorie malnutrition: Secondary | ICD-10-CM | POA: Diagnosis not present

## 2022-12-09 DIAGNOSIS — I5032 Chronic diastolic (congestive) heart failure: Secondary | ICD-10-CM

## 2022-12-09 DIAGNOSIS — J449 Chronic obstructive pulmonary disease, unspecified: Secondary | ICD-10-CM | POA: Diagnosis not present

## 2022-12-09 DIAGNOSIS — Z515 Encounter for palliative care: Secondary | ICD-10-CM

## 2022-12-09 DIAGNOSIS — R531 Weakness: Secondary | ICD-10-CM | POA: Diagnosis not present

## 2022-12-09 DIAGNOSIS — M6281 Muscle weakness (generalized): Secondary | ICD-10-CM | POA: Diagnosis not present

## 2022-12-09 DIAGNOSIS — R2689 Other abnormalities of gait and mobility: Secondary | ICD-10-CM | POA: Diagnosis not present

## 2022-12-09 NOTE — Patient Outreach (Signed)
[facility] Coordinator follow up. Desiree Hutchinson resides in [facility].   Met with SNF social workers at [facility]. Palliative is following. SNF social workers report transition plan is to remain at facility for long term.   Marthenia Rolling, MSN, RN,BSN [facility] Acute Care Coordinator [phone] (Direct dial)

## 2022-12-09 NOTE — Progress Notes (Addendum)
[facility] Consult Note Telephone: [phone]  Fax: [phone]  PATIENT NAME: Desiree Hutchinson [address]  [place] [address] [phone] (home) [phone] (work) DOB: Mar 05, 1920 MRN: [id number]  PRIMARY CARE PROVIDER:    Marcellina Millin,  No address on file None  REFERRING PROVIDER:   Irene Pap, PA-C No address on file N/A  RESPONSIBLE PARTY:    Contact Information     Name Relation Home Work Mobile   [place] Granddaughter [phone]   Irma Newness [phone]          I met face to face with patient in the facility. Visit to build trust and highlight Palliative Medicine as specialized medical care for people living with serious illness, aimed at facilitating better quality of life through symptoms relief, assisting with advance care planning and complex medical decision making.  NP called Evette and left her a voicemail with callback number.  Evette called back and was updated on visit.  She endorsed for palliative service, and provided additional information since patient with dementia.  ASSESSMENT AND / RECOMMENDATIONS:   Advance Care Planning: Our advance care planning conversation included a discussion about:    The value and importance of advance care planning  Difference between Hospice and Palliative care Exploration of goals of care in the event of a sudden injury or illness  Identification and preparation of a healthcare agent  Review and updating or creation of an  advance directive document . Decision not to resuscitate or to de-escalate disease focused treatments due to poor prognosis.  CODE STATUS: Patient is a DO NOT RESUSCITATE  Goals of Care: Goals include to maximize quality of life and symptom management  I spent 16 minutes providing this initial consultation. More than 50% of the time in this consultation was spent on  counseling patient and coordinating communication. --------------------------------------------------------------------------------------------------------------------------------------  Symptom Management/Plan: Dementia: Advanced.  FAST 7A.  Continue ongoing supportive care.  Patient is comfort care. Protein caloric malnutrition: With worsening weight loss and poor appetite.  Weight 85.2 pounds  height 5 feet  BMI 16.64.  Offered assistance during meals to ensure adequate oral intake.  Give Med pass, Ensure twice daily for nutritional supplementation.  Mirtazapine 7.5 mg at bedtime to help promote appetite.  Routine CBC CMP. COPD: Continue Yupelri and albuterol as ordered.  Avoid triggers.  Slow deep breathing encouraged. CHF: Monitor for edema and shortness of breath.  Reports fluid overload/weight gain of 2 pounds in a day or 5 pounds in a week.  Follow up: Palliative care will continue to follow for complex medical decision making, advance care planning, and clarification of goals. Return 6 weeks or prn. Encouraged to call provider sooner with any concerns.  Comfort care from hospital  Family /Caregiver/Community Supports: Patient in SNF for ongoing care.  HOSPICE ELIGIBILITY/DIAGNOSIS: TBD  Chief Complaint: Initial Palliative care visit  HISTORY OF PRESENT ILLNESS:  Desiree Hutchinson is a 88 y.o. year old female  with multiple morbidities requiring close monitoring and with high risk of complications and  mortality: Severe protein caloric malnutrition, weight loss, advanced dementia, COPD, congestive heart failure. History obtained from review of EMR, discussion with primary team, caregiver, family and/or Ms. Silvio Clayman.  Review and summarization of Epic records shows history from other than patient. Rest of 10 point ROS asked and negative. Independent interpretation of tests and reviewed as needed, available labs, patient records, imaging, studies and related documents from the EMR.  PAST  MEDICAL HISTORY:  Active Ambulatory Problems    Diagnosis Date Noted   Essential hypertension 10/14/2007   Aortic valve disorder 06/15/2010   Hypotension 10/14/2007   VENTRICULAR HYPERTROPHY, LEFT 10/14/2007   CHRONIC RHINITIS 06/26/2009   ALLERGIC RHINITIS 10/14/2007   COPD with emphysema Gold Stage A 05/28/2009   GASTRITIS 03/28/2008   SHORTNESS OF BREATH 05/29/2009   OTHER DYSPNEA AND RESPIRATORY ABNORMALITIES 05/29/2009   COUGH 12/07/2007   PRECORDIAL PAIN 05/29/2009   CHEST PAIN, NON-CARDIAC 10/14/2007   SMALL BOWEL OBSTRUCTION, HX OF 05/28/2009   TOBACCO USE, QUIT 10/14/2007   Renal insufficiency 03/27/2013   Encounter for long-term (current) use of other medications 03/27/2013   Hearing loss 01/08/2014   Constipation 01/10/2015   Loss of weight 01/10/2015   Memory loss of 01/10/2015   Lower GI bleed 07/19/2015   Diverticulosis of colon with hemorrhage 07/19/2015   Acute blood loss anemia 07/19/2015   Protein-calorie malnutrition ([place]) 07/21/2015   SBO (small bowel obstruction) ([place]) 08/06/2015   Acute kidney injury superimposed on chronic kidney disease ([place]) 08/06/2015   Chronic diastolic CHF (congestive heart failure) ([place]) 08/20/2015   Urinary incontinence 09/26/2015   Iron deficiency anemia 02/13/2016   PAD (peripheral artery disease) ([place]) 04/06/2016   GI bleed 09/12/2016   Acute GI bleeding 09/12/2016   Hemorrhagic shock (HCC)    Hematochezia    Leukocytopenia    Thrombocytopenia ([place])    Hypocalcemia 09/30/2016   UTI (urinary tract infection) 09/30/2016   Asymptomatic menopausal state 06/20/2017   Do not resuscitate 01/09/2020   Protein-calorie malnutrition, severe 01/11/2020   Breast pain, left 11/24/2021   Senile dementia ([place]) 02/25/2022   Body mass index less than 19, adult 02/25/2022   Leg weakness, bilateral 02/25/2022   Uses walker 02/25/2022   Encopresis 02/25/2022   Diurnal enuresis 02/25/2022   Physical deconditioning 11/26/2022    Palliative care by specialist 11/26/2022   Decreased oral intake 11/26/2022   Terminal care 11/26/2022   DNI (do not intubate) 11/29/2022   DNR (do not resuscitate) 11/29/2022   Resolved Ambulatory Problems    Diagnosis Date Noted   No Resolved Ambulatory Problems   Past Medical History:  Diagnosis Date   Acute lower GI bleeding 01/09/2020   Allergic rhinitis    Anemia    Aortic valve disease    Chest pain, non-cardiac    Chronic diastolic heart failure (HCC)    COPD (chronic obstructive pulmonary disease) ([place])    Gastritis    History of atherosclerotic cardiovascular disease    History of small bowel obstruction    Hypertension    Left ventricular hypertrophy    Obesity     SOCIAL HX:  Social History   Tobacco Use   Smoking status: Former    Packs/day: 0.10    Years: 30.00    Total pack years: 3.00    Types: Cigarettes    Quit date: 01/24/2010    Years since quitting: 12.8   Smokeless tobacco: Never   Tobacco comments:    smoked 1-2 cigarettes/day  Substance Use Topics   Alcohol use: No    Alcohol/week: 0.0 standard drinks of alcohol     FAMILY HX: No family history on file.    ALLERGIES: No Known Allergies    PERTINENT MEDICATIONS:  Outpatient Encounter Medications as of 12/09/2022  Medication Sig   acetaminophen (TYLENOL) 500 MG tablet May increase to 1000 mg (2 tablets) three times daily if needed for pain   albuterol (PROVENTIL) (  2.5 MG/3ML) 0.083% nebulizer solution Take 3 mLs (2.5 mg total) by nebulization every 6 (six) hours as needed for wheezing or shortness of breath.   cholecalciferol (VITAMIN D) 400 units TABS tablet Take 400 Units by mouth.   polyethylene glycol powder (GLYCOLAX/MIRALAX) 17 GM/SCOOP powder Take 17 g by mouth daily. As needed for constipation   YUPELRI 175 MCG/3ML nebulizer solution NEBULIZE CONTENTS OF 1 VIAL 1 TIME A DAY   No facility-administered encounter medications on file as of 12/09/2022.     Thank you for the  opportunity to participate in the care of Ms. Silvio Clayman.  The palliative care team will continue to follow. Please call our office at [phone] if we can be of additional assistance.   Note: Portions of this note were generated with [employer]. Dictation errors may occur despite best attempts at proofreading.  Teodoro Spray, NP

## 2022-12-09 NOTE — Telephone Encounter (Signed)
Pt coming home, granddaughter Desiree Hutchinson) wants her back home with her and she needs a hospital bed. She states she will be home within the next few days. Please advise Evette of next steps. Number [phone], if she doesn't answer she would like a text/vm.

## 2022-12-10 DIAGNOSIS — R2689 Other abnormalities of gait and mobility: Secondary | ICD-10-CM | POA: Diagnosis not present

## 2022-12-10 DIAGNOSIS — R1311 Dysphagia, oral phase: Secondary | ICD-10-CM | POA: Diagnosis not present

## 2022-12-10 DIAGNOSIS — M6281 Muscle weakness (generalized): Secondary | ICD-10-CM | POA: Diagnosis not present

## 2022-12-10 DIAGNOSIS — J449 Chronic obstructive pulmonary disease, unspecified: Secondary | ICD-10-CM | POA: Diagnosis not present

## 2022-12-11 DIAGNOSIS — R2689 Other abnormalities of gait and mobility: Secondary | ICD-10-CM | POA: Diagnosis not present

## 2022-12-11 DIAGNOSIS — J449 Chronic obstructive pulmonary disease, unspecified: Secondary | ICD-10-CM | POA: Diagnosis not present

## 2022-12-11 DIAGNOSIS — R1311 Dysphagia, oral phase: Secondary | ICD-10-CM | POA: Diagnosis not present

## 2022-12-11 DIAGNOSIS — M6281 Muscle weakness (generalized): Secondary | ICD-10-CM | POA: Diagnosis not present

## 2022-12-12 DIAGNOSIS — J449 Chronic obstructive pulmonary disease, unspecified: Secondary | ICD-10-CM | POA: Diagnosis not present

## 2022-12-12 DIAGNOSIS — M6281 Muscle weakness (generalized): Secondary | ICD-10-CM | POA: Diagnosis not present

## 2022-12-12 DIAGNOSIS — R1311 Dysphagia, oral phase: Secondary | ICD-10-CM | POA: Diagnosis not present

## 2022-12-12 DIAGNOSIS — R2689 Other abnormalities of gait and mobility: Secondary | ICD-10-CM | POA: Diagnosis not present

## 2022-12-13 DIAGNOSIS — J449 Chronic obstructive pulmonary disease, unspecified: Secondary | ICD-10-CM | POA: Diagnosis not present

## 2022-12-13 DIAGNOSIS — R2689 Other abnormalities of gait and mobility: Secondary | ICD-10-CM | POA: Diagnosis not present

## 2022-12-13 DIAGNOSIS — M6281 Muscle weakness (generalized): Secondary | ICD-10-CM | POA: Diagnosis not present

## 2022-12-13 DIAGNOSIS — R1311 Dysphagia, oral phase: Secondary | ICD-10-CM | POA: Diagnosis not present

## 2022-12-14 DIAGNOSIS — R1311 Dysphagia, oral phase: Secondary | ICD-10-CM | POA: Diagnosis not present

## 2022-12-14 DIAGNOSIS — M6281 Muscle weakness (generalized): Secondary | ICD-10-CM | POA: Diagnosis not present

## 2022-12-14 DIAGNOSIS — R2689 Other abnormalities of gait and mobility: Secondary | ICD-10-CM | POA: Diagnosis not present

## 2022-12-14 DIAGNOSIS — J449 Chronic obstructive pulmonary disease, unspecified: Secondary | ICD-10-CM | POA: Diagnosis not present

## 2022-12-15 DIAGNOSIS — J449 Chronic obstructive pulmonary disease, unspecified: Secondary | ICD-10-CM | POA: Diagnosis not present

## 2022-12-15 DIAGNOSIS — R2689 Other abnormalities of gait and mobility: Secondary | ICD-10-CM | POA: Diagnosis not present

## 2022-12-15 DIAGNOSIS — M6281 Muscle weakness (generalized): Secondary | ICD-10-CM | POA: Diagnosis not present

## 2022-12-15 DIAGNOSIS — R1311 Dysphagia, oral phase: Secondary | ICD-10-CM | POA: Diagnosis not present

## 2022-12-16 ENCOUNTER — Telehealth: Payer: Self-pay | Admitting: Internal Medicine

## 2022-12-16 ENCOUNTER — Telehealth (INDEPENDENT_AMBULATORY_CARE_PROVIDER_SITE_OTHER): Payer: Medicare Other | Admitting: Medical

## 2022-12-16 ENCOUNTER — Encounter: Payer: Self-pay | Admitting: Medical

## 2022-12-16 DIAGNOSIS — I10 Essential (primary) hypertension: Secondary | ICD-10-CM | POA: Diagnosis not present

## 2022-12-16 DIAGNOSIS — I5032 Chronic diastolic (congestive) heart failure: Secondary | ICD-10-CM | POA: Diagnosis not present

## 2022-12-16 DIAGNOSIS — E43 Unspecified severe protein-calorie malnutrition: Secondary | ICD-10-CM | POA: Diagnosis not present

## 2022-12-16 DIAGNOSIS — N289 Disorder of kidney and ureter, unspecified: Secondary | ICD-10-CM | POA: Diagnosis not present

## 2022-12-16 DIAGNOSIS — Z515 Encounter for palliative care: Secondary | ICD-10-CM | POA: Diagnosis not present

## 2022-12-16 DIAGNOSIS — Z9989 Dependence on other enabling machines and devices: Secondary | ICD-10-CM | POA: Diagnosis not present

## 2022-12-16 DIAGNOSIS — J439 Emphysema, unspecified: Secondary | ICD-10-CM | POA: Diagnosis not present

## 2022-12-16 DIAGNOSIS — R2689 Other abnormalities of gait and mobility: Secondary | ICD-10-CM | POA: Diagnosis not present

## 2022-12-16 DIAGNOSIS — R6889 Other general symptoms and signs: Secondary | ICD-10-CM

## 2022-12-16 DIAGNOSIS — N39498 Other specified urinary incontinence: Secondary | ICD-10-CM | POA: Diagnosis not present

## 2022-12-16 DIAGNOSIS — Z789 Other specified health status: Secondary | ICD-10-CM

## 2022-12-16 DIAGNOSIS — I359 Nonrheumatic aortic valve disorder, unspecified: Secondary | ICD-10-CM | POA: Diagnosis not present

## 2022-12-16 DIAGNOSIS — Z87891 Personal history of nicotine dependence: Secondary | ICD-10-CM | POA: Diagnosis not present

## 2022-12-16 DIAGNOSIS — R5381 Other malaise: Secondary | ICD-10-CM | POA: Diagnosis not present

## 2022-12-16 DIAGNOSIS — R1311 Dysphagia, oral phase: Secondary | ICD-10-CM | POA: Diagnosis not present

## 2022-12-16 DIAGNOSIS — I739 Peripheral vascular disease, unspecified: Secondary | ICD-10-CM

## 2022-12-16 DIAGNOSIS — Z9181 History of falling: Secondary | ICD-10-CM

## 2022-12-16 DIAGNOSIS — M6281 Muscle weakness (generalized): Secondary | ICD-10-CM | POA: Diagnosis not present

## 2022-12-16 DIAGNOSIS — J449 Chronic obstructive pulmonary disease, unspecified: Secondary | ICD-10-CM | POA: Diagnosis not present

## 2022-12-16 NOTE — Progress Notes (Signed)
This visit type was conducted due to national recommendations for restrictions regarding the COVID-19 Pandemic (e.g. social distancing) in an effort to limit this patient's exposure and mitigate transmission in our community.  Due to their co-morbid illnesses, this patient is at least at moderate risk for complications without adequate follow up.  This format is felt to be most appropriate for this patient at this time.    Documentation for virtual audio and video telecommunications through [place] encounter:  The patient was located at home. The provider was located in the office. The patient did consent to this visit and is aware of possible charges through their insurance for this visit.  The other persons participating in this telemedicine service were granddaughter Time spent on call was 20 minutes and in review of previous records >30 minutes total.  This virtual service is not related to other E/M service within previous 7 days.    Subjective: Chief Complaint  Patient presents with   Hospitalization Follow-up    Hospitalized on 11/19/2022 and discharged on 11/29/2022 to SNF. Wants to discuss getting a hospital bed for home use.     The visit today was conducted mainly by patient's granddaughter and caregiver Franchot Mimes.   Patient was seated in the background  This is my first visit with this patient.   Medical team: Dr. Excell Seltzer cardiology Dr. Chilton Greathouse, pulmonology Dr. Nile Riggs, ophthalmology  Her medical history includes COPD, heart failure, aortic valve disease, GI bleed, hypertension, history of small bowel obstruction, prior tobacco use, mild dementia, frailty, malnutrition.  She was hospitalized apparently back in November and discharged to skilled nursing facility on 11/29/2022.  She is still there presently and will be getting discharged likely soon to home.  At home [facility] and nurse aides will be taking care of her  She requires total care.  They are  requesting a hospital bed.  She needs help with ADLs, bathing, feeding.  She uses sponge baths now does not really go to a bathtub or shower.  Given her fall risk they want the hospital bed with rails to help prevent falls and to be able to help lift her.  They also would like a lift to help get her out of the bed when needed.  That is what they are using at the nursing facility currently  She is currently only 86 pounds.  The highest weight she has ever been is 120 pounds.  She has been malnourished but is getting back to eating her regular amounts now.  She is currently [facility] skilled nursing facility  She apparently was on several other medicines but recently cardiology took her off of several medications.  She is still doing breathing treatments regularly.  She had surgery in November on her abdomen.  She is hard of hearing in general.  She has some mild dementia according to granddaughter   Past Medical History:  Diagnosis Date   Acute lower GI bleeding 01/09/2020   Allergic rhinitis    Anemia    Aortic valve disease    regurge > stenosis   Chest pain, non-cardiac    Chronic diastolic heart failure (HCC)    COPD (chronic obstructive pulmonary disease) (HCC)    Cough    Gastritis    History of atherosclerotic cardiovascular disease    History of small bowel obstruction    Hypertension    Left ventricular hypertrophy    Obesity    Personal history of tobacco use, presenting hazards to health  Current Outpatient Medications on File Prior to Visit  Medication Sig Dispense Refill   acetaminophen (TYLENOL) 500 MG tablet May increase to 1000 mg (2 tablets) three times daily if needed for pain (Patient not taking: Reported on 12/16/2022) 90 tablet 1   albuterol (PROVENTIL) (2.5 MG/3ML) 0.083% nebulizer solution Take 3 mLs (2.5 mg total) by nebulization every 6 (six) hours as needed for wheezing or shortness of breath. 75 mL 5   cholecalciferol (VITAMIN D) 400 units TABS tablet  Take 400 Units by mouth. (Patient not taking: Reported on 12/16/2022)     polyethylene glycol powder (GLYCOLAX/MIRALAX) 17 GM/SCOOP powder Take 17 g by mouth daily. As needed for constipation (Patient not taking: Reported on 12/16/2022) 500 g 2   YUPELRI 175 MCG/3ML nebulizer solution NEBULIZE CONTENTS OF 1 VIAL 1 TIME A DAY (Patient not taking: Reported on 12/16/2022) 90 mL 5   No current facility-administered medications on file prior to visit.   ROS as in subjective   Objective: There were no vitals taken for this visit.  Gen: Seated, no acute distress    Assessment: Encounter Diagnoses  Name Primary?   Chronic diastolic CHF (congestive heart failure) (HCC) Yes   Essential hypertension    PAD (peripheral artery disease) (HCC)    Aortic valve disorder    Pulmonary emphysema, unspecified emphysema type (HCC)    Renal insufficiency    Physical deconditioning    Severe protein-calorie malnutrition (HCC)    Uses walker    Other urinary incontinence    Terminal care    Former smoker    At high risk for injury related to fall    Decreased activities of daily living (ADL)    Total self-care deficit      Plan: I will review her recent hospital records.  I reviewed her overall chart record medications and problem list.  I reviewed recent hospital visit information.  Medicines were reconciled.  We will initiate the process of a hospital bed but it is not clear whether or not she is still seeing palliative care.  We will call back and asked some additional questions related to other care that is either recently involved or planned in case they can easier get a hospital bed in there to help as well as a lift such as [facility] lift.   After our initial virtual consult I realized that there have been notations in the chart about recent palliative care initiation, comments about terminal care.  We will check in with family again to see if she is currently receiving care from palliative  care or other services that may have easier ability to get a hospital bed into the home quickly  I reviewed several emergency department notes from November and December.  Particular there is a hospitalist consult on 11/19/2022.  At that time she presented for leg pain, and hospitalist notes showed that she is debilitated, has severe dementia and family needed assistance in taking care of her.  The plan was to try to get her moved to skilled nursing facility at that time which is what ultimately happened.  She is wheelchair/bedbound.  I reviewed the 11/21/2022 palliative care assessment plan.  She was continued as a DO NOT RESUSCITATE DNR patient.  The plan was to continue comfort care, they had a long discussion about going forward what type of end-of-life care would be available and what that would look like.  Chart notations from 12/01/2022 states that the family was not interested in bringing in  hospice at that time.  At this point he is awaiting a granddaughter to call back to see if there still working with the palliative care team or what resources may be available to get a hospital bed at the home before she is discharged from skilled nursing facility.  The current plan is for her granddaughter and nurse aides to take care of patient in her home now that she has spent a little time in a skilled nursing facility  Torey was seen today for hospitalization follow-up.  Diagnoses and all orders for this visit:  Chronic diastolic CHF (congestive heart failure) (HCC)  Essential hypertension  PAD (peripheral artery disease) (HCC)  Aortic valve disorder  Pulmonary emphysema, unspecified emphysema type (HCC)  Renal insufficiency  Physical deconditioning  Severe protein-calorie malnutrition (HCC)  Uses walker  Other urinary incontinence  Terminal care  Former smoker  At high risk for injury related to fall  Decreased activities of daily living (ADL)  Total self-care  deficit    F/u pending paperwork

## 2022-12-16 NOTE — Progress Notes (Signed)
Daughter was notified and Advised to Call [facility] care since she is under comfort care. I have also sent a message to the NP that saw pt recently to inquire to see if they can order a hospital bed vs us sending to [facility] 

## 2022-12-16 NOTE — Telephone Encounter (Signed)
Daughter was notified and Advised to Call [facility] care since she is under comfort care. I have also sent a message to the NP that saw pt recently to inquire to see if they can order a hospital bed vs [place] sending to [facility]

## 2022-12-16 NOTE — Telephone Encounter (Signed)
Pt's granddaughter called and states that [facility] [address] is needing a rx for hospital bed first before they can run through insurance.

## 2022-12-17 DIAGNOSIS — F02A3 Dementia in other diseases classified elsewhere, mild, with mood disturbance: Secondary | ICD-10-CM | POA: Diagnosis not present

## 2022-12-17 DIAGNOSIS — M6281 Muscle weakness (generalized): Secondary | ICD-10-CM | POA: Diagnosis not present

## 2022-12-17 DIAGNOSIS — R1311 Dysphagia, oral phase: Secondary | ICD-10-CM | POA: Diagnosis not present

## 2022-12-17 DIAGNOSIS — R2689 Other abnormalities of gait and mobility: Secondary | ICD-10-CM | POA: Diagnosis not present

## 2022-12-17 DIAGNOSIS — J449 Chronic obstructive pulmonary disease, unspecified: Secondary | ICD-10-CM | POA: Diagnosis not present

## 2022-12-19 DIAGNOSIS — M6281 Muscle weakness (generalized): Secondary | ICD-10-CM | POA: Diagnosis not present

## 2022-12-19 DIAGNOSIS — J449 Chronic obstructive pulmonary disease, unspecified: Secondary | ICD-10-CM | POA: Diagnosis not present

## 2022-12-19 DIAGNOSIS — R2689 Other abnormalities of gait and mobility: Secondary | ICD-10-CM | POA: Diagnosis not present

## 2022-12-19 DIAGNOSIS — R1311 Dysphagia, oral phase: Secondary | ICD-10-CM | POA: Diagnosis not present

## 2022-12-20 DIAGNOSIS — N1831 Chronic kidney disease, stage 3a: Secondary | ICD-10-CM | POA: Diagnosis not present

## 2022-12-20 DIAGNOSIS — I5030 Unspecified diastolic (congestive) heart failure: Secondary | ICD-10-CM | POA: Diagnosis not present

## 2022-12-20 DIAGNOSIS — G311 Senile degeneration of brain, not elsewhere classified: Secondary | ICD-10-CM | POA: Diagnosis not present

## 2022-12-20 DIAGNOSIS — I130 Hypertensive heart and chronic kidney disease with heart failure and stage 1 through stage 4 chronic kidney disease, or unspecified chronic kidney disease: Secondary | ICD-10-CM | POA: Diagnosis not present

## 2022-12-20 DIAGNOSIS — M1990 Unspecified osteoarthritis, unspecified site: Secondary | ICD-10-CM | POA: Diagnosis not present

## 2022-12-20 DIAGNOSIS — E785 Hyperlipidemia, unspecified: Secondary | ICD-10-CM | POA: Diagnosis not present

## 2022-12-20 DIAGNOSIS — I70209 Unspecified atherosclerosis of native arteries of extremities, unspecified extremity: Secondary | ICD-10-CM | POA: Diagnosis not present

## 2022-12-20 DIAGNOSIS — F0282 Dementia in other diseases classified elsewhere, unspecified severity, with psychotic disturbance: Secondary | ICD-10-CM | POA: Diagnosis not present

## 2022-12-20 DIAGNOSIS — D631 Anemia in chronic kidney disease: Secondary | ICD-10-CM | POA: Diagnosis not present

## 2022-12-20 DIAGNOSIS — E43 Unspecified severe protein-calorie malnutrition: Secondary | ICD-10-CM | POA: Diagnosis not present

## 2022-12-20 DIAGNOSIS — J309 Allergic rhinitis, unspecified: Secondary | ICD-10-CM | POA: Diagnosis not present

## 2022-12-20 DIAGNOSIS — J449 Chronic obstructive pulmonary disease, unspecified: Secondary | ICD-10-CM | POA: Diagnosis not present

## 2022-12-20 DIAGNOSIS — I350 Nonrheumatic aortic (valve) stenosis: Secondary | ICD-10-CM | POA: Diagnosis not present

## 2022-12-20 DIAGNOSIS — I2510 Atherosclerotic heart disease of native coronary artery without angina pectoris: Secondary | ICD-10-CM | POA: Diagnosis not present

## 2022-12-20 DIAGNOSIS — Z681 Body mass index (BMI) 19 or less, adult: Secondary | ICD-10-CM | POA: Diagnosis not present

## 2022-12-21 DIAGNOSIS — G311 Senile degeneration of brain, not elsewhere classified: Secondary | ICD-10-CM | POA: Diagnosis not present

## 2022-12-21 DIAGNOSIS — F0282 Dementia in other diseases classified elsewhere, unspecified severity, with psychotic disturbance: Secondary | ICD-10-CM | POA: Diagnosis not present

## 2022-12-21 DIAGNOSIS — E43 Unspecified severe protein-calorie malnutrition: Secondary | ICD-10-CM | POA: Diagnosis not present

## 2022-12-21 DIAGNOSIS — N1831 Chronic kidney disease, stage 3a: Secondary | ICD-10-CM | POA: Diagnosis not present

## 2022-12-21 DIAGNOSIS — I130 Hypertensive heart and chronic kidney disease with heart failure and stage 1 through stage 4 chronic kidney disease, or unspecified chronic kidney disease: Secondary | ICD-10-CM | POA: Diagnosis not present

## 2022-12-21 DIAGNOSIS — I5030 Unspecified diastolic (congestive) heart failure: Secondary | ICD-10-CM | POA: Diagnosis not present

## 2022-12-23 DIAGNOSIS — N1831 Chronic kidney disease, stage 3a: Secondary | ICD-10-CM | POA: Diagnosis not present

## 2022-12-23 DIAGNOSIS — I130 Hypertensive heart and chronic kidney disease with heart failure and stage 1 through stage 4 chronic kidney disease, or unspecified chronic kidney disease: Secondary | ICD-10-CM | POA: Diagnosis not present

## 2022-12-23 DIAGNOSIS — G311 Senile degeneration of brain, not elsewhere classified: Secondary | ICD-10-CM | POA: Diagnosis not present

## 2022-12-23 DIAGNOSIS — I5030 Unspecified diastolic (congestive) heart failure: Secondary | ICD-10-CM | POA: Diagnosis not present

## 2022-12-23 DIAGNOSIS — F0282 Dementia in other diseases classified elsewhere, unspecified severity, with psychotic disturbance: Secondary | ICD-10-CM | POA: Diagnosis not present

## 2022-12-23 DIAGNOSIS — E43 Unspecified severe protein-calorie malnutrition: Secondary | ICD-10-CM | POA: Diagnosis not present

## 2022-12-24 DIAGNOSIS — G311 Senile degeneration of brain, not elsewhere classified: Secondary | ICD-10-CM | POA: Diagnosis not present

## 2022-12-24 DIAGNOSIS — F0282 Dementia in other diseases classified elsewhere, unspecified severity, with psychotic disturbance: Secondary | ICD-10-CM | POA: Diagnosis not present

## 2022-12-24 DIAGNOSIS — I130 Hypertensive heart and chronic kidney disease with heart failure and stage 1 through stage 4 chronic kidney disease, or unspecified chronic kidney disease: Secondary | ICD-10-CM | POA: Diagnosis not present

## 2022-12-24 DIAGNOSIS — E43 Unspecified severe protein-calorie malnutrition: Secondary | ICD-10-CM | POA: Diagnosis not present

## 2022-12-24 DIAGNOSIS — I5030 Unspecified diastolic (congestive) heart failure: Secondary | ICD-10-CM | POA: Diagnosis not present

## 2022-12-24 DIAGNOSIS — N1831 Chronic kidney disease, stage 3a: Secondary | ICD-10-CM | POA: Diagnosis not present

## 2022-12-27 DIAGNOSIS — I2510 Atherosclerotic heart disease of native coronary artery without angina pectoris: Secondary | ICD-10-CM | POA: Diagnosis not present

## 2022-12-27 DIAGNOSIS — G311 Senile degeneration of brain, not elsewhere classified: Secondary | ICD-10-CM | POA: Diagnosis not present

## 2022-12-27 DIAGNOSIS — Z681 Body mass index (BMI) 19 or less, adult: Secondary | ICD-10-CM | POA: Diagnosis not present

## 2022-12-27 DIAGNOSIS — M1990 Unspecified osteoarthritis, unspecified site: Secondary | ICD-10-CM | POA: Diagnosis not present

## 2022-12-27 DIAGNOSIS — E43 Unspecified severe protein-calorie malnutrition: Secondary | ICD-10-CM | POA: Diagnosis not present

## 2022-12-27 DIAGNOSIS — N1831 Chronic kidney disease, stage 3a: Secondary | ICD-10-CM | POA: Diagnosis not present

## 2022-12-27 DIAGNOSIS — I130 Hypertensive heart and chronic kidney disease with heart failure and stage 1 through stage 4 chronic kidney disease, or unspecified chronic kidney disease: Secondary | ICD-10-CM | POA: Diagnosis not present

## 2022-12-27 DIAGNOSIS — J449 Chronic obstructive pulmonary disease, unspecified: Secondary | ICD-10-CM | POA: Diagnosis not present

## 2022-12-27 DIAGNOSIS — J309 Allergic rhinitis, unspecified: Secondary | ICD-10-CM | POA: Diagnosis not present

## 2022-12-27 DIAGNOSIS — I5030 Unspecified diastolic (congestive) heart failure: Secondary | ICD-10-CM | POA: Diagnosis not present

## 2022-12-27 DIAGNOSIS — I350 Nonrheumatic aortic (valve) stenosis: Secondary | ICD-10-CM | POA: Diagnosis not present

## 2022-12-27 DIAGNOSIS — E785 Hyperlipidemia, unspecified: Secondary | ICD-10-CM | POA: Diagnosis not present

## 2022-12-27 DIAGNOSIS — F0282 Dementia in other diseases classified elsewhere, unspecified severity, with psychotic disturbance: Secondary | ICD-10-CM | POA: Diagnosis not present

## 2022-12-27 DIAGNOSIS — D631 Anemia in chronic kidney disease: Secondary | ICD-10-CM | POA: Diagnosis not present

## 2022-12-27 DIAGNOSIS — I70209 Unspecified atherosclerosis of native arteries of extremities, unspecified extremity: Secondary | ICD-10-CM | POA: Diagnosis not present

## 2022-12-28 DIAGNOSIS — N1831 Chronic kidney disease, stage 3a: Secondary | ICD-10-CM | POA: Diagnosis not present

## 2022-12-28 DIAGNOSIS — E43 Unspecified severe protein-calorie malnutrition: Secondary | ICD-10-CM | POA: Diagnosis not present

## 2022-12-28 DIAGNOSIS — I130 Hypertensive heart and chronic kidney disease with heart failure and stage 1 through stage 4 chronic kidney disease, or unspecified chronic kidney disease: Secondary | ICD-10-CM | POA: Diagnosis not present

## 2022-12-28 DIAGNOSIS — I5030 Unspecified diastolic (congestive) heart failure: Secondary | ICD-10-CM | POA: Diagnosis not present

## 2022-12-28 DIAGNOSIS — G311 Senile degeneration of brain, not elsewhere classified: Secondary | ICD-10-CM | POA: Diagnosis not present

## 2022-12-28 DIAGNOSIS — F0282 Dementia in other diseases classified elsewhere, unspecified severity, with psychotic disturbance: Secondary | ICD-10-CM | POA: Diagnosis not present

## 2022-12-29 DIAGNOSIS — I130 Hypertensive heart and chronic kidney disease with heart failure and stage 1 through stage 4 chronic kidney disease, or unspecified chronic kidney disease: Secondary | ICD-10-CM | POA: Diagnosis not present

## 2022-12-29 DIAGNOSIS — E43 Unspecified severe protein-calorie malnutrition: Secondary | ICD-10-CM | POA: Diagnosis not present

## 2022-12-29 DIAGNOSIS — G311 Senile degeneration of brain, not elsewhere classified: Secondary | ICD-10-CM | POA: Diagnosis not present

## 2022-12-29 DIAGNOSIS — I5030 Unspecified diastolic (congestive) heart failure: Secondary | ICD-10-CM | POA: Diagnosis not present

## 2022-12-29 DIAGNOSIS — F0282 Dementia in other diseases classified elsewhere, unspecified severity, with psychotic disturbance: Secondary | ICD-10-CM | POA: Diagnosis not present

## 2022-12-29 DIAGNOSIS — N1831 Chronic kidney disease, stage 3a: Secondary | ICD-10-CM | POA: Diagnosis not present

## 2022-12-31 DIAGNOSIS — F0282 Dementia in other diseases classified elsewhere, unspecified severity, with psychotic disturbance: Secondary | ICD-10-CM | POA: Diagnosis not present

## 2022-12-31 DIAGNOSIS — E43 Unspecified severe protein-calorie malnutrition: Secondary | ICD-10-CM | POA: Diagnosis not present

## 2022-12-31 DIAGNOSIS — N1831 Chronic kidney disease, stage 3a: Secondary | ICD-10-CM | POA: Diagnosis not present

## 2022-12-31 DIAGNOSIS — G311 Senile degeneration of brain, not elsewhere classified: Secondary | ICD-10-CM | POA: Diagnosis not present

## 2022-12-31 DIAGNOSIS — I130 Hypertensive heart and chronic kidney disease with heart failure and stage 1 through stage 4 chronic kidney disease, or unspecified chronic kidney disease: Secondary | ICD-10-CM | POA: Diagnosis not present

## 2022-12-31 DIAGNOSIS — I5030 Unspecified diastolic (congestive) heart failure: Secondary | ICD-10-CM | POA: Diagnosis not present

## 2023-01-04 DIAGNOSIS — N1831 Chronic kidney disease, stage 3a: Secondary | ICD-10-CM | POA: Diagnosis not present

## 2023-01-04 DIAGNOSIS — F0282 Dementia in other diseases classified elsewhere, unspecified severity, with psychotic disturbance: Secondary | ICD-10-CM | POA: Diagnosis not present

## 2023-01-04 DIAGNOSIS — G311 Senile degeneration of brain, not elsewhere classified: Secondary | ICD-10-CM | POA: Diagnosis not present

## 2023-01-04 DIAGNOSIS — E43 Unspecified severe protein-calorie malnutrition: Secondary | ICD-10-CM | POA: Diagnosis not present

## 2023-01-04 DIAGNOSIS — I5030 Unspecified diastolic (congestive) heart failure: Secondary | ICD-10-CM | POA: Diagnosis not present

## 2023-01-04 DIAGNOSIS — I130 Hypertensive heart and chronic kidney disease with heart failure and stage 1 through stage 4 chronic kidney disease, or unspecified chronic kidney disease: Secondary | ICD-10-CM | POA: Diagnosis not present

## 2023-01-05 DIAGNOSIS — N1831 Chronic kidney disease, stage 3a: Secondary | ICD-10-CM | POA: Diagnosis not present

## 2023-01-05 DIAGNOSIS — E43 Unspecified severe protein-calorie malnutrition: Secondary | ICD-10-CM | POA: Diagnosis not present

## 2023-01-05 DIAGNOSIS — I130 Hypertensive heart and chronic kidney disease with heart failure and stage 1 through stage 4 chronic kidney disease, or unspecified chronic kidney disease: Secondary | ICD-10-CM | POA: Diagnosis not present

## 2023-01-05 DIAGNOSIS — I5030 Unspecified diastolic (congestive) heart failure: Secondary | ICD-10-CM | POA: Diagnosis not present

## 2023-01-05 DIAGNOSIS — F0282 Dementia in other diseases classified elsewhere, unspecified severity, with psychotic disturbance: Secondary | ICD-10-CM | POA: Diagnosis not present

## 2023-01-05 DIAGNOSIS — G311 Senile degeneration of brain, not elsewhere classified: Secondary | ICD-10-CM | POA: Diagnosis not present

## 2023-01-07 DIAGNOSIS — G311 Senile degeneration of brain, not elsewhere classified: Secondary | ICD-10-CM | POA: Diagnosis not present

## 2023-01-07 DIAGNOSIS — I130 Hypertensive heart and chronic kidney disease with heart failure and stage 1 through stage 4 chronic kidney disease, or unspecified chronic kidney disease: Secondary | ICD-10-CM | POA: Diagnosis not present

## 2023-01-07 DIAGNOSIS — F0282 Dementia in other diseases classified elsewhere, unspecified severity, with psychotic disturbance: Secondary | ICD-10-CM | POA: Diagnosis not present

## 2023-01-07 DIAGNOSIS — E43 Unspecified severe protein-calorie malnutrition: Secondary | ICD-10-CM | POA: Diagnosis not present

## 2023-01-07 DIAGNOSIS — I5030 Unspecified diastolic (congestive) heart failure: Secondary | ICD-10-CM | POA: Diagnosis not present

## 2023-01-07 DIAGNOSIS — N1831 Chronic kidney disease, stage 3a: Secondary | ICD-10-CM | POA: Diagnosis not present

## 2023-01-11 DIAGNOSIS — N1831 Chronic kidney disease, stage 3a: Secondary | ICD-10-CM | POA: Diagnosis not present

## 2023-01-11 DIAGNOSIS — G311 Senile degeneration of brain, not elsewhere classified: Secondary | ICD-10-CM | POA: Diagnosis not present

## 2023-01-11 DIAGNOSIS — E43 Unspecified severe protein-calorie malnutrition: Secondary | ICD-10-CM | POA: Diagnosis not present

## 2023-01-11 DIAGNOSIS — I5030 Unspecified diastolic (congestive) heart failure: Secondary | ICD-10-CM | POA: Diagnosis not present

## 2023-01-11 DIAGNOSIS — F0282 Dementia in other diseases classified elsewhere, unspecified severity, with psychotic disturbance: Secondary | ICD-10-CM | POA: Diagnosis not present

## 2023-01-11 DIAGNOSIS — I130 Hypertensive heart and chronic kidney disease with heart failure and stage 1 through stage 4 chronic kidney disease, or unspecified chronic kidney disease: Secondary | ICD-10-CM | POA: Diagnosis not present

## 2023-01-12 DIAGNOSIS — J449 Chronic obstructive pulmonary disease, unspecified: Secondary | ICD-10-CM | POA: Diagnosis not present

## 2023-01-12 DIAGNOSIS — F0282 Dementia in other diseases classified elsewhere, unspecified severity, with psychotic disturbance: Secondary | ICD-10-CM | POA: Diagnosis not present

## 2023-01-12 DIAGNOSIS — G311 Senile degeneration of brain, not elsewhere classified: Secondary | ICD-10-CM | POA: Diagnosis not present

## 2023-01-12 DIAGNOSIS — I130 Hypertensive heart and chronic kidney disease with heart failure and stage 1 through stage 4 chronic kidney disease, or unspecified chronic kidney disease: Secondary | ICD-10-CM | POA: Diagnosis not present

## 2023-01-12 DIAGNOSIS — I5032 Chronic diastolic (congestive) heart failure: Secondary | ICD-10-CM | POA: Diagnosis not present

## 2023-01-12 DIAGNOSIS — F02A3 Dementia in other diseases classified elsewhere, mild, with mood disturbance: Secondary | ICD-10-CM | POA: Diagnosis not present

## 2023-01-12 DIAGNOSIS — E43 Unspecified severe protein-calorie malnutrition: Secondary | ICD-10-CM | POA: Diagnosis not present

## 2023-01-12 DIAGNOSIS — I5030 Unspecified diastolic (congestive) heart failure: Secondary | ICD-10-CM | POA: Diagnosis not present

## 2023-01-12 DIAGNOSIS — N1831 Chronic kidney disease, stage 3a: Secondary | ICD-10-CM | POA: Diagnosis not present

## 2023-01-14 DIAGNOSIS — G311 Senile degeneration of brain, not elsewhere classified: Secondary | ICD-10-CM | POA: Diagnosis not present

## 2023-01-14 DIAGNOSIS — I130 Hypertensive heart and chronic kidney disease with heart failure and stage 1 through stage 4 chronic kidney disease, or unspecified chronic kidney disease: Secondary | ICD-10-CM | POA: Diagnosis not present

## 2023-01-14 DIAGNOSIS — F0282 Dementia in other diseases classified elsewhere, unspecified severity, with psychotic disturbance: Secondary | ICD-10-CM | POA: Diagnosis not present

## 2023-01-14 DIAGNOSIS — I5030 Unspecified diastolic (congestive) heart failure: Secondary | ICD-10-CM | POA: Diagnosis not present

## 2023-01-14 DIAGNOSIS — N1831 Chronic kidney disease, stage 3a: Secondary | ICD-10-CM | POA: Diagnosis not present

## 2023-01-14 DIAGNOSIS — E43 Unspecified severe protein-calorie malnutrition: Secondary | ICD-10-CM | POA: Diagnosis not present

## 2023-01-18 DIAGNOSIS — E43 Unspecified severe protein-calorie malnutrition: Secondary | ICD-10-CM | POA: Diagnosis not present

## 2023-01-18 DIAGNOSIS — N1831 Chronic kidney disease, stage 3a: Secondary | ICD-10-CM | POA: Diagnosis not present

## 2023-01-18 DIAGNOSIS — I5030 Unspecified diastolic (congestive) heart failure: Secondary | ICD-10-CM | POA: Diagnosis not present

## 2023-01-18 DIAGNOSIS — I130 Hypertensive heart and chronic kidney disease with heart failure and stage 1 through stage 4 chronic kidney disease, or unspecified chronic kidney disease: Secondary | ICD-10-CM | POA: Diagnosis not present

## 2023-01-18 DIAGNOSIS — F0282 Dementia in other diseases classified elsewhere, unspecified severity, with psychotic disturbance: Secondary | ICD-10-CM | POA: Diagnosis not present

## 2023-01-18 DIAGNOSIS — G311 Senile degeneration of brain, not elsewhere classified: Secondary | ICD-10-CM | POA: Diagnosis not present

## 2023-01-19 DIAGNOSIS — N1831 Chronic kidney disease, stage 3a: Secondary | ICD-10-CM | POA: Diagnosis not present

## 2023-01-19 DIAGNOSIS — G311 Senile degeneration of brain, not elsewhere classified: Secondary | ICD-10-CM | POA: Diagnosis not present

## 2023-01-19 DIAGNOSIS — I5030 Unspecified diastolic (congestive) heart failure: Secondary | ICD-10-CM | POA: Diagnosis not present

## 2023-01-19 DIAGNOSIS — I130 Hypertensive heart and chronic kidney disease with heart failure and stage 1 through stage 4 chronic kidney disease, or unspecified chronic kidney disease: Secondary | ICD-10-CM | POA: Diagnosis not present

## 2023-01-19 DIAGNOSIS — F0282 Dementia in other diseases classified elsewhere, unspecified severity, with psychotic disturbance: Secondary | ICD-10-CM | POA: Diagnosis not present

## 2023-01-19 DIAGNOSIS — E43 Unspecified severe protein-calorie malnutrition: Secondary | ICD-10-CM | POA: Diagnosis not present

## 2023-01-20 DIAGNOSIS — F0282 Dementia in other diseases classified elsewhere, unspecified severity, with psychotic disturbance: Secondary | ICD-10-CM | POA: Diagnosis not present

## 2023-01-20 DIAGNOSIS — I5030 Unspecified diastolic (congestive) heart failure: Secondary | ICD-10-CM | POA: Diagnosis not present

## 2023-01-20 DIAGNOSIS — N1831 Chronic kidney disease, stage 3a: Secondary | ICD-10-CM | POA: Diagnosis not present

## 2023-01-20 DIAGNOSIS — G311 Senile degeneration of brain, not elsewhere classified: Secondary | ICD-10-CM | POA: Diagnosis not present

## 2023-01-20 DIAGNOSIS — E43 Unspecified severe protein-calorie malnutrition: Secondary | ICD-10-CM | POA: Diagnosis not present

## 2023-01-20 DIAGNOSIS — I130 Hypertensive heart and chronic kidney disease with heart failure and stage 1 through stage 4 chronic kidney disease, or unspecified chronic kidney disease: Secondary | ICD-10-CM | POA: Diagnosis not present

## 2023-01-21 DIAGNOSIS — N1831 Chronic kidney disease, stage 3a: Secondary | ICD-10-CM | POA: Diagnosis not present

## 2023-01-21 DIAGNOSIS — I5030 Unspecified diastolic (congestive) heart failure: Secondary | ICD-10-CM | POA: Diagnosis not present

## 2023-01-21 DIAGNOSIS — I130 Hypertensive heart and chronic kidney disease with heart failure and stage 1 through stage 4 chronic kidney disease, or unspecified chronic kidney disease: Secondary | ICD-10-CM | POA: Diagnosis not present

## 2023-01-21 DIAGNOSIS — E43 Unspecified severe protein-calorie malnutrition: Secondary | ICD-10-CM | POA: Diagnosis not present

## 2023-01-21 DIAGNOSIS — G311 Senile degeneration of brain, not elsewhere classified: Secondary | ICD-10-CM | POA: Diagnosis not present

## 2023-01-21 DIAGNOSIS — F0282 Dementia in other diseases classified elsewhere, unspecified severity, with psychotic disturbance: Secondary | ICD-10-CM | POA: Diagnosis not present

## 2023-01-22 DIAGNOSIS — N1831 Chronic kidney disease, stage 3a: Secondary | ICD-10-CM | POA: Diagnosis not present

## 2023-01-22 DIAGNOSIS — G311 Senile degeneration of brain, not elsewhere classified: Secondary | ICD-10-CM | POA: Diagnosis not present

## 2023-01-22 DIAGNOSIS — I5030 Unspecified diastolic (congestive) heart failure: Secondary | ICD-10-CM | POA: Diagnosis not present

## 2023-01-22 DIAGNOSIS — I130 Hypertensive heart and chronic kidney disease with heart failure and stage 1 through stage 4 chronic kidney disease, or unspecified chronic kidney disease: Secondary | ICD-10-CM | POA: Diagnosis not present

## 2023-01-22 DIAGNOSIS — E43 Unspecified severe protein-calorie malnutrition: Secondary | ICD-10-CM | POA: Diagnosis not present

## 2023-01-22 DIAGNOSIS — F0282 Dementia in other diseases classified elsewhere, unspecified severity, with psychotic disturbance: Secondary | ICD-10-CM | POA: Diagnosis not present

## 2023-01-23 DIAGNOSIS — G311 Senile degeneration of brain, not elsewhere classified: Secondary | ICD-10-CM | POA: Diagnosis not present

## 2023-01-23 DIAGNOSIS — E43 Unspecified severe protein-calorie malnutrition: Secondary | ICD-10-CM | POA: Diagnosis not present

## 2023-01-23 DIAGNOSIS — I130 Hypertensive heart and chronic kidney disease with heart failure and stage 1 through stage 4 chronic kidney disease, or unspecified chronic kidney disease: Secondary | ICD-10-CM | POA: Diagnosis not present

## 2023-01-23 DIAGNOSIS — N1831 Chronic kidney disease, stage 3a: Secondary | ICD-10-CM | POA: Diagnosis not present

## 2023-01-23 DIAGNOSIS — I5030 Unspecified diastolic (congestive) heart failure: Secondary | ICD-10-CM | POA: Diagnosis not present

## 2023-01-23 DIAGNOSIS — F0282 Dementia in other diseases classified elsewhere, unspecified severity, with psychotic disturbance: Secondary | ICD-10-CM | POA: Diagnosis not present

## 2023-01-24 DIAGNOSIS — I130 Hypertensive heart and chronic kidney disease with heart failure and stage 1 through stage 4 chronic kidney disease, or unspecified chronic kidney disease: Secondary | ICD-10-CM | POA: Diagnosis not present

## 2023-01-24 DIAGNOSIS — I5030 Unspecified diastolic (congestive) heart failure: Secondary | ICD-10-CM | POA: Diagnosis not present

## 2023-01-24 DIAGNOSIS — G311 Senile degeneration of brain, not elsewhere classified: Secondary | ICD-10-CM | POA: Diagnosis not present

## 2023-01-24 DIAGNOSIS — E43 Unspecified severe protein-calorie malnutrition: Secondary | ICD-10-CM | POA: Diagnosis not present

## 2023-01-24 DIAGNOSIS — F0282 Dementia in other diseases classified elsewhere, unspecified severity, with psychotic disturbance: Secondary | ICD-10-CM | POA: Diagnosis not present

## 2023-01-24 DIAGNOSIS — N1831 Chronic kidney disease, stage 3a: Secondary | ICD-10-CM | POA: Diagnosis not present

## 2023-01-25 DIAGNOSIS — E43 Unspecified severe protein-calorie malnutrition: Secondary | ICD-10-CM | POA: Diagnosis not present

## 2023-01-25 DIAGNOSIS — F0282 Dementia in other diseases classified elsewhere, unspecified severity, with psychotic disturbance: Secondary | ICD-10-CM | POA: Diagnosis not present

## 2023-01-25 DIAGNOSIS — I130 Hypertensive heart and chronic kidney disease with heart failure and stage 1 through stage 4 chronic kidney disease, or unspecified chronic kidney disease: Secondary | ICD-10-CM | POA: Diagnosis not present

## 2023-01-25 DIAGNOSIS — G311 Senile degeneration of brain, not elsewhere classified: Secondary | ICD-10-CM | POA: Diagnosis not present

## 2023-01-25 DIAGNOSIS — I5030 Unspecified diastolic (congestive) heart failure: Secondary | ICD-10-CM | POA: Diagnosis not present

## 2023-01-25 DIAGNOSIS — N1831 Chronic kidney disease, stage 3a: Secondary | ICD-10-CM | POA: Diagnosis not present

## 2023-01-26 DIAGNOSIS — I5030 Unspecified diastolic (congestive) heart failure: Secondary | ICD-10-CM | POA: Diagnosis not present

## 2023-01-26 DIAGNOSIS — N1831 Chronic kidney disease, stage 3a: Secondary | ICD-10-CM | POA: Diagnosis not present

## 2023-01-26 DIAGNOSIS — I130 Hypertensive heart and chronic kidney disease with heart failure and stage 1 through stage 4 chronic kidney disease, or unspecified chronic kidney disease: Secondary | ICD-10-CM | POA: Diagnosis not present

## 2023-01-26 DIAGNOSIS — G311 Senile degeneration of brain, not elsewhere classified: Secondary | ICD-10-CM | POA: Diagnosis not present

## 2023-01-26 DIAGNOSIS — E43 Unspecified severe protein-calorie malnutrition: Secondary | ICD-10-CM | POA: Diagnosis not present

## 2023-01-26 DIAGNOSIS — F0282 Dementia in other diseases classified elsewhere, unspecified severity, with psychotic disturbance: Secondary | ICD-10-CM | POA: Diagnosis not present

## 2023-01-27 DIAGNOSIS — I70209 Unspecified atherosclerosis of native arteries of extremities, unspecified extremity: Secondary | ICD-10-CM | POA: Diagnosis not present

## 2023-01-27 DIAGNOSIS — D631 Anemia in chronic kidney disease: Secondary | ICD-10-CM | POA: Diagnosis not present

## 2023-01-27 DIAGNOSIS — J309 Allergic rhinitis, unspecified: Secondary | ICD-10-CM | POA: Diagnosis not present

## 2023-01-27 DIAGNOSIS — J449 Chronic obstructive pulmonary disease, unspecified: Secondary | ICD-10-CM | POA: Diagnosis not present

## 2023-01-27 DIAGNOSIS — M1990 Unspecified osteoarthritis, unspecified site: Secondary | ICD-10-CM | POA: Diagnosis not present

## 2023-01-27 DIAGNOSIS — I5030 Unspecified diastolic (congestive) heart failure: Secondary | ICD-10-CM | POA: Diagnosis not present

## 2023-01-27 DIAGNOSIS — Z681 Body mass index (BMI) 19 or less, adult: Secondary | ICD-10-CM | POA: Diagnosis not present

## 2023-01-27 DIAGNOSIS — G311 Senile degeneration of brain, not elsewhere classified: Secondary | ICD-10-CM | POA: Diagnosis not present

## 2023-01-27 DIAGNOSIS — I2510 Atherosclerotic heart disease of native coronary artery without angina pectoris: Secondary | ICD-10-CM | POA: Diagnosis not present

## 2023-01-27 DIAGNOSIS — F0282 Dementia in other diseases classified elsewhere, unspecified severity, with psychotic disturbance: Secondary | ICD-10-CM | POA: Diagnosis not present

## 2023-01-27 DIAGNOSIS — I130 Hypertensive heart and chronic kidney disease with heart failure and stage 1 through stage 4 chronic kidney disease, or unspecified chronic kidney disease: Secondary | ICD-10-CM | POA: Diagnosis not present

## 2023-01-27 DIAGNOSIS — N1831 Chronic kidney disease, stage 3a: Secondary | ICD-10-CM | POA: Diagnosis not present

## 2023-01-27 DIAGNOSIS — E43 Unspecified severe protein-calorie malnutrition: Secondary | ICD-10-CM | POA: Diagnosis not present

## 2023-01-27 DIAGNOSIS — E785 Hyperlipidemia, unspecified: Secondary | ICD-10-CM | POA: Diagnosis not present

## 2023-01-27 DIAGNOSIS — I350 Nonrheumatic aortic (valve) stenosis: Secondary | ICD-10-CM | POA: Diagnosis not present

## 2023-01-28 DIAGNOSIS — E43 Unspecified severe protein-calorie malnutrition: Secondary | ICD-10-CM | POA: Diagnosis not present

## 2023-01-28 DIAGNOSIS — I5032 Chronic diastolic (congestive) heart failure: Secondary | ICD-10-CM | POA: Diagnosis not present

## 2023-01-28 DIAGNOSIS — R1311 Dysphagia, oral phase: Secondary | ICD-10-CM | POA: Diagnosis not present

## 2023-01-28 DIAGNOSIS — I5030 Unspecified diastolic (congestive) heart failure: Secondary | ICD-10-CM | POA: Diagnosis not present

## 2023-01-28 DIAGNOSIS — F02A3 Dementia in other diseases classified elsewhere, mild, with mood disturbance: Secondary | ICD-10-CM | POA: Diagnosis not present

## 2023-01-28 DIAGNOSIS — F0282 Dementia in other diseases classified elsewhere, unspecified severity, with psychotic disturbance: Secondary | ICD-10-CM | POA: Diagnosis not present

## 2023-01-28 DIAGNOSIS — J449 Chronic obstructive pulmonary disease, unspecified: Secondary | ICD-10-CM | POA: Diagnosis not present

## 2023-01-28 DIAGNOSIS — N1831 Chronic kidney disease, stage 3a: Secondary | ICD-10-CM | POA: Diagnosis not present

## 2023-01-28 DIAGNOSIS — G311 Senile degeneration of brain, not elsewhere classified: Secondary | ICD-10-CM | POA: Diagnosis not present

## 2023-01-28 DIAGNOSIS — I130 Hypertensive heart and chronic kidney disease with heart failure and stage 1 through stage 4 chronic kidney disease, or unspecified chronic kidney disease: Secondary | ICD-10-CM | POA: Diagnosis not present

## 2023-01-29 DIAGNOSIS — G311 Senile degeneration of brain, not elsewhere classified: Secondary | ICD-10-CM | POA: Diagnosis not present

## 2023-01-29 DIAGNOSIS — I5030 Unspecified diastolic (congestive) heart failure: Secondary | ICD-10-CM | POA: Diagnosis not present

## 2023-01-29 DIAGNOSIS — I130 Hypertensive heart and chronic kidney disease with heart failure and stage 1 through stage 4 chronic kidney disease, or unspecified chronic kidney disease: Secondary | ICD-10-CM | POA: Diagnosis not present

## 2023-01-29 DIAGNOSIS — N1831 Chronic kidney disease, stage 3a: Secondary | ICD-10-CM | POA: Diagnosis not present

## 2023-01-29 DIAGNOSIS — F0282 Dementia in other diseases classified elsewhere, unspecified severity, with psychotic disturbance: Secondary | ICD-10-CM | POA: Diagnosis not present

## 2023-01-29 DIAGNOSIS — E43 Unspecified severe protein-calorie malnutrition: Secondary | ICD-10-CM | POA: Diagnosis not present

## 2023-01-30 DIAGNOSIS — N1831 Chronic kidney disease, stage 3a: Secondary | ICD-10-CM | POA: Diagnosis not present

## 2023-01-30 DIAGNOSIS — E43 Unspecified severe protein-calorie malnutrition: Secondary | ICD-10-CM | POA: Diagnosis not present

## 2023-01-30 DIAGNOSIS — F0282 Dementia in other diseases classified elsewhere, unspecified severity, with psychotic disturbance: Secondary | ICD-10-CM | POA: Diagnosis not present

## 2023-01-30 DIAGNOSIS — I5030 Unspecified diastolic (congestive) heart failure: Secondary | ICD-10-CM | POA: Diagnosis not present

## 2023-01-30 DIAGNOSIS — G311 Senile degeneration of brain, not elsewhere classified: Secondary | ICD-10-CM | POA: Diagnosis not present

## 2023-01-30 DIAGNOSIS — I130 Hypertensive heart and chronic kidney disease with heart failure and stage 1 through stage 4 chronic kidney disease, or unspecified chronic kidney disease: Secondary | ICD-10-CM | POA: Diagnosis not present
